# Patient Record
Sex: Male | Born: 1965 | Race: Black or African American | Hispanic: No | Marital: Single | Smoking: Former smoker
Health system: Southern US, Community
[De-identification: ages and names within clinical notes are randomized; demographics above are authoritative.]

## PROBLEM LIST (undated history)

## (undated) DIAGNOSIS — Z87442 Personal history of urinary calculi: Secondary | ICD-10-CM

## (undated) DIAGNOSIS — Z800 Family history of malignant neoplasm of digestive organs: Secondary | ICD-10-CM

## (undated) DIAGNOSIS — N529 Male erectile dysfunction, unspecified: Secondary | ICD-10-CM

## (undated) DIAGNOSIS — Z9289 Personal history of other medical treatment: Secondary | ICD-10-CM

## (undated) DIAGNOSIS — M545 Low back pain: Secondary | ICD-10-CM

## (undated) DIAGNOSIS — Z8249 Family history of ischemic heart disease and other diseases of the circulatory system: Secondary | ICD-10-CM

## (undated) DIAGNOSIS — Z973 Presence of spectacles and contact lenses: Secondary | ICD-10-CM

## (undated) DIAGNOSIS — M1990 Unspecified osteoarthritis, unspecified site: Secondary | ICD-10-CM

## (undated) DIAGNOSIS — N200 Calculus of kidney: Secondary | ICD-10-CM

## (undated) DIAGNOSIS — Z87891 Personal history of nicotine dependence: Secondary | ICD-10-CM

## (undated) HISTORY — DX: Low back pain: M54.5

## (undated) HISTORY — DX: Calculus of kidney: N20.0

## (undated) HISTORY — PX: WISDOM TOOTH EXTRACTION: SHX21

## (undated) HISTORY — DX: Presence of spectacles and contact lenses: Z97.3

## (undated) HISTORY — DX: Family history of malignant neoplasm of digestive organs: Z80.0

## (undated) HISTORY — DX: Personal history of nicotine dependence: Z87.891

## (undated) HISTORY — DX: Family history of ischemic heart disease and other diseases of the circulatory system: Z82.49

## (undated) HISTORY — DX: Personal history of other medical treatment: Z92.89

## (undated) HISTORY — PX: FOOT SURGERY: SHX648

## (undated) HISTORY — DX: Male erectile dysfunction, unspecified: N52.9

---

## 2007-05-09 ENCOUNTER — Emergency Department (HOSPITAL_COMMUNITY): Admission: EM | Admit: 2007-05-09 | Discharge: 2007-05-09 | Payer: Self-pay | Admitting: Emergency Medicine

## 2008-06-28 ENCOUNTER — Emergency Department (HOSPITAL_COMMUNITY): Admission: EM | Admit: 2008-06-28 | Discharge: 2008-06-28 | Payer: Self-pay | Admitting: Emergency Medicine

## 2009-05-05 DIAGNOSIS — Z8249 Family history of ischemic heart disease and other diseases of the circulatory system: Secondary | ICD-10-CM

## 2009-05-05 HISTORY — DX: Family history of ischemic heart disease and other diseases of the circulatory system: Z82.49

## 2009-09-26 ENCOUNTER — Ambulatory Visit: Payer: Self-pay | Admitting: Internal Medicine

## 2009-09-26 ENCOUNTER — Encounter (INDEPENDENT_AMBULATORY_CARE_PROVIDER_SITE_OTHER): Payer: Self-pay | Admitting: *Deleted

## 2009-09-26 DIAGNOSIS — Z87891 Personal history of nicotine dependence: Secondary | ICD-10-CM

## 2009-09-26 DIAGNOSIS — M545 Low back pain, unspecified: Secondary | ICD-10-CM

## 2009-09-26 HISTORY — DX: Low back pain, unspecified: M54.50

## 2009-09-26 HISTORY — DX: Personal history of nicotine dependence: Z87.891

## 2009-09-28 ENCOUNTER — Encounter (INDEPENDENT_AMBULATORY_CARE_PROVIDER_SITE_OTHER): Payer: Self-pay | Admitting: *Deleted

## 2009-09-28 ENCOUNTER — Ambulatory Visit: Payer: Self-pay | Admitting: Internal Medicine

## 2009-09-28 ENCOUNTER — Ambulatory Visit: Payer: Self-pay | Admitting: Cardiovascular Disease

## 2009-09-28 DIAGNOSIS — N200 Calculus of kidney: Secondary | ICD-10-CM

## 2009-09-28 HISTORY — DX: Calculus of kidney: N20.0

## 2009-10-03 ENCOUNTER — Encounter (INDEPENDENT_AMBULATORY_CARE_PROVIDER_SITE_OTHER): Payer: Self-pay | Admitting: *Deleted

## 2009-10-03 ENCOUNTER — Telehealth: Payer: Self-pay | Admitting: Internal Medicine

## 2009-10-05 ENCOUNTER — Ambulatory Visit: Payer: Self-pay | Admitting: Internal Medicine

## 2009-10-05 LAB — CONVERTED CEMR LAB
Ketones, ur: NEGATIVE mg/dL
Leukocytes, UA: NEGATIVE
Nitrite: NEGATIVE
Specific Gravity, Urine: 1.02 (ref 1.000–1.030)
Urobilinogen, UA: 0.2 (ref 0.0–1.0)
pH: 5.5 (ref 5.0–8.0)

## 2009-10-31 ENCOUNTER — Encounter: Payer: Self-pay | Admitting: Internal Medicine

## 2009-11-01 ENCOUNTER — Ambulatory Visit: Payer: Self-pay | Admitting: Internal Medicine

## 2009-11-12 ENCOUNTER — Telehealth: Payer: Self-pay | Admitting: Internal Medicine

## 2009-11-13 ENCOUNTER — Encounter: Payer: Self-pay | Admitting: Internal Medicine

## 2010-01-01 ENCOUNTER — Ambulatory Visit: Payer: Self-pay | Admitting: Family Medicine
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
## 2010-06-04 NOTE — Progress Notes (Signed)
Summary: MRI l-spine denial  ---- Converted from flag ---- ---- 11/12/2009 8:53 AM, Shelbie Proctor wrote: Yes-Appt scheduled for 11-13-2009@10 :45 am  [place]   ---- 11/12/2009 8:30 AM, Newt Lukes MD wrote: did he get referral to Nsurg yet? - have pt keep consult visit with Nsurg and they can arrange for MRI as needed - thanks  ---- 11/12/2009 8:10 AM, Shelbie Proctor wrote: Dr Felicity Coyer pt was not approved by insurance company [employer]  to have MRI Lumbar spine, see fax on your desk appt was not made please advise ------------------------------  noted - ok - thanks

## 2010-06-04 NOTE — Letter (Signed)
Summary: Out of Work  [facility]  [address] [place], [place] [address]   Phone: [phone]  Fax: [phone]    October 03, 2009   Employee:  Mitchell Hughes    To Whom It May Concern:   For Medical reasons, please excuse the above named employee from work for the following dates:  Start:   10/04/2009  End:   10/08/2009  Return to work 10/08/2009  If you need additional information, please feel free to contact our office.         Sincerely,    Dr. Oliver Barre

## 2010-06-04 NOTE — Progress Notes (Signed)
Summary: Work note/VAL pt  Phone Note Call from Patient Call back at [employer] [phone]   Caller: Patient Summary of Call: pt called stating that he was written out of work until today but is still experiencing pain. Pt contacted his employer and was informed that there is no light duty. Pt is requesting work note extention until Friday June 3rd to return to work Monday June 6th. please advise. Initial call taken by: Margaret Pyle, CMA,  October 03, 2009 10:03 AM  Follow-up for Phone Call        ok for note extension - to robin to handle Follow-up by: Corwin Levins MD,  October 03, 2009 1:31 PM  Additional Follow-up for Phone Call Additional follow up Details #1::        patient informed work note extension complete and to pickup at front desk. Additional Follow-up by: Scharlene Gloss,  October 03, 2009 2:28 PM

## 2010-06-04 NOTE — Consult Note (Signed)
Summary: [facility]  [facility]   Imported By: Lennie Odor 11/07/2009 16:54:35  _____________________________________________________________________  External Attachment:    Type:   Image     Comment:   External Document

## 2010-06-04 NOTE — Letter (Signed)
Summary: Out of Work  [facility]  [address] [place], [place] [address]   Phone: [phone]  Fax: [phone]    Sep 28, 2009   Employee:  Mitchell Hughes    To Whom It May Concern:   For Medical reasons, please excuse the above named employee from work for the following dates:  Start: 09/26/09    End: 10/03/09    If you need additional information, please feel free to contact our office.         Sincerely,    Dr. Rene Paci

## 2010-06-04 NOTE — Consult Note (Signed)
Summary: [facility]  [facility]   Imported By: Lester [place] 11/21/2009 09:34:03  _____________________________________________________________________  External Attachment:    Type:   Image     Comment:   External Document

## 2010-06-04 NOTE — Assessment & Plan Note (Signed)
Summary: BACK PAIN  STC   Vital Signs:  Patient profile:   45 year old male Height:      77 inches (195.58 cm) Weight:      219.8 pounds (99.91 kg) O2 Sat:      98 % on Room air Temp:     98.4 degrees F (36.89 degrees C) oral Pulse rate:   66 / minute BP sitting:   102 / 68  (left arm) Cuff size:   large  Vitals Entered By: Orlan Leavens (November 01, 2009 8:58 AM)  O2 Flow:  Room air CC: Back pain Is Patient Diabetic? No Pain Assessment Patient in pain? yes     Location: lower back Type: aching   Primary Care Provider:  Newt Lukes MD  CC:  Back pain.  History of Present Illness:  Back Pain      This is a 45 year old man who presents with continued Back pain.  The symptoms began 4 weeks ago.  The intensity is described as moderate.  Previously, pain 10/10 at start, now 4-5/10 with treatment.  The patient denies fever, chills, weakness, loss of sensation, fecal incontinence, urinary incontinence, urinary retention, dysuria, rest pain, and inability to care for self.  The pain is located in the right low back.  The pain began gradually.  The pain radiates to the right flank and right hip.  The pain is made worse by flexion and extension.  The pain is made better by inactivity, NSAID medications including pred pak, muscle relaxants, and opioids.  He has gone back to work but still struggling with same pain.  dx kidney stone 09/26/09 - ?possible cause of same pain -- recent uro eval for same - told stone not cause of these symptoms   Current Medications (verified): 1)  Excedrin Back & Body 250-250 Mg Tabs (Acetaminophen-Aspirin Buffered) .... Take As Needed 2)  Robaxin-750 750 Mg Tabs (Methocarbamol) .Marland Kitchen.. 1 By Mouth Three Times A Day As Needed For Spasm Pain 3)  Vicodin Es 7.5-750 Mg Tabs (Hydrocodone-Acetaminophen) .Marland Kitchen.. 1 By Mouth Every 6 Hours Orn For Severe Pain 4)  Indomethacin 25 Mg Caps (Indomethacin) .Marland Kitchen.. 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    Physical Exam  General:  uncomfortable but less so than prior OVs  -alert, well-developed, well-nourished, and well-hydrated.   Lungs:  normal respiratory effort, no intercostal retractions, no accessory muscle use, normal breath sounds, no dullness, no fremitus, no crackles, and no wheezes.   Heart:  normal rate, regular rhythm, no murmur, no gallop, no rub, and no JVD.   Msk:  back: full range of motion of lumbar spine. tender to palpation over right paraspinal resion R>L upper lumbar region.. positive ipsilateral straight leg raise. Deep tendon reflexes symmetrically intact at Achilles and patella, negative clonus. Sensation intact throughout all dermatomes in bilateral lower extremities. Full strength to manual muscle testing in all major muscule groups including EHL, anterior tibialis, gastrocnemius, quadriceps, and iliopsoas. Able to heel and toe walk without difficulty and ambulates with a normal gait.   Neurologic:  see Mskel above   Impression & Recommendations:  Problem # 1:  LOW BACK PAIN, ACUTE (ICD-724.2)  ongoing symptoms > 4 weeks, improved but still 5/10 pain despite aggressive med care (pred pak, muscle relax, NSAIDs and narcotics) arrange for MRI now and refer to Nsurg for suspected ruptured disc given abn on plain xray (09/26/09 reviewed - spondylitic changes, retrolisthiesis L 2 on 3, loss of disc height L5-S1), pt high risk of disc rupture, esp high lumbar given location and intesity of continued pain --- refeill vicodin today  His updated medication list for  this problem includes:    Excedrin Back & Body 250-250 Mg Tabs (Acetaminophen-aspirin buffered) .Marland Kitchen... Take as needed    Robaxin-750 750 Mg Tabs (Methocarbamol) .Marland Kitchen... 1 by mouth three times a day as needed for spasm pain    Vicodin Es 7.5-750 Mg Tabs (Hydrocodone-acetaminophen) .Marland Kitchen... 1 by mouth every 6 hours orn for severe pain    Indomethacin 25 Mg Caps (Indomethacin) .Marland Kitchen... 1 by mouth three times a day as needed for pain  Orders: Radiology Referral (Radiology) Neurosurgeon Referral (Neurosurgeon)  Problem # 2:  RENAL CALCULUS, RIGHT (ICD-592.0) not felt to be cause of symptoms - s/p uro eval for same -  Complete Medication List: 1)  Excedrin Back & Body 250-250 Mg Tabs (Acetaminophen-aspirin buffered) .... Take as needed 2)  Robaxin-750 750 Mg Tabs (Methocarbamol) .Marland Kitchen.. 1 by mouth three times a day as needed for spasm pain 3)  Vicodin Es 7.5-750 Mg Tabs (Hydrocodone-acetaminophen) .Marland Kitchen.. 1 by mouth every 6 hours orn for severe pain 4)  Indomethacin 25 Mg Caps (Indomethacin) .Marland Kitchen.. 1 by mouth three times a day as needed for pain  Patient Instructions: 1)  it was good to see you today. 2)  we'll make referral for MRI of your back and evaluation by neurosurg if there is a ruptured disc or pinched nerve. Our office will contact you regarding these appointments once made.  3)  continue to drink at least two extra quarts of water every day to prevent new kidney stone formation. 4)  Most patients (90%) with low back pain will improve with time (2-6 weeks). Keep active but avoid activities that are painful. Apply moist heat and/or ice to lower back several times a day. Prescriptions: VICODIN ES 7.5-750 MG TABS (HYDROCODONE-ACETAMINOPHEN) 1 by mouth every 6 hours orn for severe pain  #20 x 1   Entered and Authorized by:   Newt Lukes MD   Signed by:   Newt Lukes MD on 11/01/2009   Method used:   Print then Give to Patient   RxID:   [id number]

## 2010-06-04 NOTE — Letter (Signed)
Summary: Out of Work  [facility]  [address] [place], [place] [address]   Phone: [phone]  Fax: [phone]    Sep 26, 2009   Employee:  DAUNTAE DERUSHA    To Whom It May Concern:   For Medical reasons, please excuse the above named employee from work for the following dates:  Start: 09/26/09    End: 09/28/09    If you need additional information, please feel free to contact our office.         Sincerely,    Dr. Rene Paci

## 2010-06-04 NOTE — Assessment & Plan Note (Signed)
Summary: BACK PAIN/ KIDNEY STONE? / [facility]   Vital Signs:  Patient profile:   45 year old male Height:      77 inches Weight:      218.75 pounds BMI:     26.03 O2 Sat:      97 % on Room air Temp:     98.0 degrees F oral Pulse rate:   88 / minute Pulse rhythm:   regular Resp:     16 per minute BP sitting:   108 / 80  (left arm) Cuff size:   large  Vitals Entered By: Rock Nephew CMA (October 05, 2009 10:58 AM)  Nutrition Counseling: Patient's BMI is greater than 25 and therefore counseled on weight management options.  O2 Flow:  Room air  Primary Care Provider:  Newt Lukes MD  CC:  Back pain.  History of Present Illness:  Back Pain      This is a 45 year old man who presents with Back pain.  The symptoms began 2 weeks ago.  The intensity is described as moderate.  The patient reports inability to work, but denies fever, chills, weakness, loss of sensation, fecal incontinence, urinary incontinence, urinary retention, dysuria, rest pain, and inability to care for self.  The pain is located in the right low back.  The pain began gradually.  The pain radiates to the right flank and right hip.  The pain is made worse by flexion and extension.  The pain is made better by inactivity, NSAID medications, muscle relaxants, and opioids.  He wants to try to go back to work next Monday.  Preventive Screening-Counseling & Management  Alcohol-Tobacco     Alcohol drinks/day: 0     Smoking Status: quit  Caffeine-Diet-Exercise     Does Patient Exercise: yes      Drug Use:  no.    Medications Prior to Update: 1)  Excedrin Back & Body 250-250 Mg Tabs (Acetaminophen-Aspirin Buffered) .... Take As Needed 2)  Prednisone (Pak) 10 Mg Tabs (Prednisone) .... As Directed X 12 Days 3)  Robaxin-750 750 Mg Tabs (Methocarbamol) .Marland Kitchen.. 1 By Mouth Three Times A Day As Needed For Spasm Pain 4)  Vicodin Es 7.5-750 Mg Tabs (Hydrocodone-Acetaminophen) .Marland Kitchen.. 1 By Mouth Every 6 Hours Orn For Severe Pain 5)   Indomethacin 25 Mg Caps (Indomethacin) .Marland Kitchen.. 1 By Mouth Three Times A Day As Needed For Pain  Current Medications (verified): 1)  Excedrin Back & Body 250-250 Mg Tabs (Acetaminophen-Aspirin Buffered) .... Take As Needed 2)  Prednisone (Pak) 10 Mg Tabs (Prednisone) .... As Directed X 12 Days 3)  Robaxin-750 750 Mg Tabs (Methocarbamol) .Marland Kitchen.. 1 By Mouth Three Times A Day As Needed For Spasm Pain 4)  Vicodin Es 7.5-750 Mg Tabs (Hydrocodone-Acetaminophen) .Marland Kitchen.. 1 By Mouth Every 6 Hours Orn For Severe Pain 5)  Indomethacin 25 Mg Caps (Indomethacin) .Marland Kitchen.. 1 By Mouth Three Times A Day As Needed For Pain  Allergies (verified): No Known Drug Allergies  Past History:  Past Medical History: Reviewed history from 09/28/2009 and no changes required. R kidney stone, 9.38mm (09/28/09 CT)  Past Surgical History: Reviewed history from 09/26/2009 and no changes required. Denies surgical history  Family History: Reviewed history from 09/26/2009 and no changes required. Family History of Colon CA 1st degree relative <60 (other relative) Heart disease (parent, other relative)  Social History: Reviewed history from 09/26/2009 and no changes required. Former Smoker - quit 03/2009 social alcohol -  works 3rd shift -  Alcohol  use-no Drug use-no Regular exercise-yes Drug Use:  no Does Patient Exercise:  yes  Review of Systems  The patient denies anorexia, fever, weight loss, chest pain, syncope, prolonged cough, headaches, hemoptysis, abdominal pain, hematuria, incontinence, and enlarged lymph nodes.   GU:  Denies discharge, dysuria, hematuria, incontinence, urinary frequency, and urinary hesitancy.  Physical Exam  General:  alert, well-developed, well-nourished, well-hydrated, appropriate dress, normal appearance, healthy-appearing, cooperative to examination, and good hygiene.   Head:  normocephalic, atraumatic, no abnormalities observed, and no abnormalities palpated.   Eyes:  vision grossly intact  and no injection.   Mouth:  Oral mucosa and oropharynx without lesions or exudates.  Teeth in good repair. Neck:  supple, full ROM, and no masses.   Lungs:  normal respiratory effort, no intercostal retractions, no accessory muscle use, normal breath sounds, no dullness, no fremitus, no crackles, and no wheezes.   Heart:  normal rate, regular rhythm, no murmur, no gallop, no rub, and no JVD.   Abdomen:  soft, non-tender, normal bowel sounds, no distention, no masses, no guarding, no rigidity, no rebound tenderness, no hepatomegaly, and no splenomegaly.  No CVAT. Msk:  normal ROM, no joint tenderness, no joint swelling, no joint warmth, no redness over joints, no joint deformities, no joint instability, and no crepitation.   Pulses:  R and L carotid,radial,femoral,dorsalis pedis and posterior tibial pulses are full and equal bilaterally Extremities:  No clubbing, cyanosis, edema, or deformity noted with normal full range of motion of all joints.   Neurologic:  No cranial nerve deficits noted. Station and gait are normal. Plantar reflexes are down-going bilaterally. DTRs are symmetrical throughout. Sensory, motor and coordinative functions appear intact. Skin:  turgor normal, color normal, no rashes, no suspicious lesions, no ecchymoses, no petechiae, no purpura, no ulcerations, and no edema.   Cervical Nodes:  no anterior cervical adenopathy and no posterior cervical adenopathy.   Psych:  Cognition and judgment appear intact. Alert and cooperative with normal attention span and concentration. No apparent delusions, illusions, hallucinations   Detailed Back/Spine Exam  Gait:    antalgic.    Lumbosacral Exam:  Inspection-deformity:    Normal Palpation-spinal tenderness:  Normal Range of Motion:    Forward Flexion:   85 degrees    Hyperextension:   30 degrees    Right Lateral Bend:   30 degrees    Left Lateral Bend:   30 degrees Lying Straight Leg Raise:    Right:  negative    Left:   negative Sitting Straight Leg Raise:    Right:  negative    Left:  negative Contralateral Straight Leg Raise:    Right:  negative    Left:  negative Sciatic Notch:    There is no sciatic notch tenderness. Toe Walking:    Right:  normal    Left:  normal Heel Walking:    Right:  normal    Left:  normal   Impression & Recommendations:  Problem # 1:  RENAL CALCULUS, RIGHT (ICD-592.0) Assessment Unchanged if there is blood in his urine then his pain may be renal colic, otherwise the data shows that this is a non-obstructing and uncomplicated stone that is not causing any symptoms.  Orders: TLB-Udip w/ Micro (81001-URINE)  Problem # 2:  LOW BACK PAIN, ACUTE (ICD-724.2) Assessment: Improved Fitness for Duty Certification was completed by me today- see scanned document, will offer PT is he is not able to work on Monday due to pain His updated medication list for this  problem includes:    Excedrin Back & Body 250-250 Mg Tabs (Acetaminophen-aspirin buffered) .Marland Kitchen... Take as needed    Robaxin-750 750 Mg Tabs (Methocarbamol) .Marland Kitchen... 1 by mouth three times a day as needed for spasm pain    Vicodin Es 7.5-750 Mg Tabs (Hydrocodone-acetaminophen) .Marland Kitchen... 1 by mouth every 6 hours orn for severe pain    Indomethacin 25 Mg Caps (Indomethacin) .Marland Kitchen... 1 by mouth three times a day as needed for pain  Discussed use of moist heat or ice, modified activities, medications, and stretching/strengthening exercises. Back care instructions given. To be seen in 2 weeks if no improvement; sooner if worsening of symptoms.   Orders: Physical Therapy Referral (PT)  Complete Medication List: 1)  Excedrin Back & Body 250-250 Mg Tabs (Acetaminophen-aspirin buffered) .... Take as needed 2)  Prednisone (pak) 10 Mg Tabs (Prednisone) .... As directed x 12 days 3)  Robaxin-750 750 Mg Tabs (Methocarbamol) .Marland Kitchen.. 1 by mouth three times a day as needed for spasm pain 4)  Vicodin Es 7.5-750 Mg Tabs (Hydrocodone-acetaminophen)  .Marland Kitchen.. 1 by mouth every 6 hours orn for severe pain 5)  Indomethacin 25 Mg Caps (Indomethacin) .Marland Kitchen.. 1 by mouth three times a day as needed for pain  Patient Instructions: 1)  Please schedule a follow-up appointment in 1 month. 2)  Drink at least two extra quarts of water every day to prevent kidney stone formation. 3)  Most patients (90%) with low back pain will improve with time (2-6 weeks). Keep active but avoid activities that are painful. Apply moist heat and/or ice to lower back several times a day.
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
## 2010-10-31 ENCOUNTER — Encounter: Payer: Self-pay | Admitting: Internal Medicine

## 2011-03-04 ENCOUNTER — Encounter: Payer: Self-pay | Admitting: Family Medicine

## 2013-03-14 ENCOUNTER — Ambulatory Visit
Admission: RE | Admit: 2013-03-14 | Discharge: 2013-03-14 | Disposition: A | Discharge status: Home / Self Care | Payer: Managed Care, Other (non HMO) | Source: Ambulatory Visit | Attending: Medical | Admitting: Medical

## 2013-03-14 ENCOUNTER — Ambulatory Visit (INDEPENDENT_AMBULATORY_CARE_PROVIDER_SITE_OTHER): Payer: Managed Care, Other (non HMO) | Admitting: Medical

## 2013-03-14 ENCOUNTER — Encounter: Payer: Self-pay | Admitting: Medical

## 2013-03-14 VITALS — BP 120/80 | HR 60 | Temp 98.2°F | Resp 16 | Wt 235.0 lb

## 2013-03-14 DIAGNOSIS — B356 Tinea cruris: Secondary | ICD-10-CM

## 2013-03-14 DIAGNOSIS — M25511 Pain in right shoulder: Secondary | ICD-10-CM

## 2013-03-14 DIAGNOSIS — M25519 Pain in unspecified shoulder: Secondary | ICD-10-CM

## 2013-03-14 MED ORDER — FLUCONAZOLE 150 MG PO TABS: ORAL_TABLET | ORAL | Status: DC

## 2013-03-14 MED ORDER — DICLOFENAC SODIUM 75 MG PO TBEC: 75.0000 mg | DELAYED_RELEASE_TABLET | Freq: Two times a day (BID) | ORAL | Status: DC

## 2013-03-14 MED ORDER — NYSTATIN 100000 UNIT/GM EX POWD: Freq: Four times a day (QID) | CUTANEOUS | Status: DC

## 2013-03-14 NOTE — Patient Instructions (Addendum)
Jock itch - continued Lamisil cream daily, begin Diflucan tablet, 1 tablet now, repeat in 1 week.  You can also use the Nystatin powder for the next 1-2 weeks, daily, then just as needed.  Consider boxers.   Keep genital region dry.   Shoulder pain - go for xray.  Begin Diclofenac tablet twice daily for pain.  Use ice pack 20 minutes each evening.  If the pain and range of motion imporved in the next 7-10 days, then try some home rehab measures.

## 2013-03-14 NOTE — Progress Notes (Signed)
Subjective:  Mitchell Hughes is a 47 y.o. male who presents as a new returning patient.  He has c/o right shoulder pain.  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   He is right handed.  Works in [employer], receiving, does some lifting on the job.  Over the last few months he has had more pain with lifting shoulder in any direction.  Keeps him up at night.  Pain doesn't radiate.   It is deep within the shoulder joint.  improved if arm still.  Discomfort is constant.  Has tried Advil, helps with throbbing pain, heat.  Denies neck pain, stiffness, denies arm numbness, tingling, no shooting pain down arms.  No swelling, no weakness.  Mother had hx/o shoulder arthritis. Runs for exercise.  Was lifting weights up until pain worsened.  No prior injury, fall, trauma in the past or recent.  No prior eval for similar.  No other aggravating or relieving factors.    He notes hx/o jock itch for months. Has used Lamisil and other OTC creams.  It won't go away.  Usually wears boxer briefs, gets sweaty.  Has had jock itch before, but usually it will resolve with OTC medications.    No other c/o.  The following portions of the patient's history were reviewed and updated as appropriate: allergies, current medications, past family history, past medical history, past social history, past surgical history and problem list.  ROS Otherwise as in subjective above  Objective: Physical Exam  BP 120/80  Pulse 60  Temp(Src) 98.2 F (36.8 C) (Oral)  Resp 16  Wt 235 lb (106.595 kg)   General appearance: alert, no distress, WD/WN Neck: supple, normal ROM, no lymphadenopathy, no thyromegaly, no masses MSK: right shoulder nontender to palpation, reduced internal and external ROM, pain with flexion and abduction over 80 degrees, pain with both passive active and resisted ROM, +empty can test, + hawkins test, + drop arm.  Rest of bilat UE unremarkable Back: upper back nontender, no  deformity Pulses: 2+ radial pulses, 2+ pedal pulses, normal cap refill Ext: no edema Neuro: normal UE strength, sensation DTRS Skin: scrotum and adjacent inner thighs as well as penis with rough, raw, pink/red appearance suggestive of tinea   Assessment: Encounter Diagnoses  Name Primary?  . Shoulder pain, right Yes  . Tinea cruris      Plan: Patient Instructions  Jock itch - continued Lamisil cream daily, begin Diflucan tablet, 1 tablet now, repeat in 1 week.  You can also use the Nystatin powder for the next 1-2 weeks, daily, then just as needed.  Consider boxers.   Keep genital region dry.   Shoulder pain - go for xray.  Begin Diclofenac tablet twice daily for pain.  Use ice pack 20 minutes each evening.  If the pain and range of motion imporved in the next 7-10 days, then try some home rehab measures.

## 2013-03-18 NOTE — Progress Notes (Signed)
lmom to cb. cls 

## 2013-03-28 ENCOUNTER — Encounter: Payer: Self-pay | Admitting: Family Medicine

## 2013-03-28 ENCOUNTER — Ambulatory Visit (INDEPENDENT_AMBULATORY_CARE_PROVIDER_SITE_OTHER): Payer: Managed Care, Other (non HMO) | Admitting: Family Medicine

## 2013-03-28 ENCOUNTER — Ambulatory Visit: Payer: Self-pay | Admitting: Family Medicine

## 2013-03-28 VITALS — BP 112/70 | HR 84 | Wt 230.0 lb

## 2013-03-28 DIAGNOSIS — M67919 Unspecified disorder of synovium and tendon, unspecified shoulder: Secondary | ICD-10-CM

## 2013-03-28 DIAGNOSIS — M7581 Other shoulder lesions, right shoulder: Secondary | ICD-10-CM

## 2013-03-28 MED ORDER — TRIAMCINOLONE ACETONIDE 40 MG/ML IJ SUSP
40.0000 mg | Freq: Once | INTRAMUSCULAR | Status: AC
  Administered 2013-03-28: 40 mg via INTRAMUSCULAR

## 2013-03-28 MED ORDER — LIDOCAINE HCL (PF) 1 % IJ SOLN
3.0000 mL | Freq: Once | INTRAMUSCULAR | Status: AC
  Administered 2013-03-28: 3 mL via INTRADERMAL

## 2013-03-28 NOTE — Progress Notes (Signed)
  Subjective:    Patient ID: Mitchell Hughes, male    DOB: 02/25/1966, 47 y.o.   MRN: [id number]  HPI He is here for evaluation of right shoulder pain and possible injection. See previous note for history.   Review of Systems     Objective:   Physical Exam Pain on motion of the shoulder with abduction, internal and external rotation. No palpable tenderness. Negative sulcus sign. Drop arm test was uncomfortable. Near his and Mitchell Hughes test cause discomfort.       Assessment & Plan:  Rotator cuff tendinitis, right - Plan: triamcinolone acetonide (KENALOG-40) injection 40 mg, lidocaine (PF) (XYLOCAINE) 1 % injection 3 mL  discussed injection and its benefits. The right shoulder was prepped with Betadine and 40 mg of Kenalog and 3 cc of Xylocaine was injected into the subacromial bursa without difficulty. Within the first few minutes he did obtain some relief of his symptoms. Recommend he call me if he has any difficulties. Explained that this his symptoms came back quickly, further evaluation will be needed.

## 2013-05-05 DIAGNOSIS — Z9289 Personal history of other medical treatment: Secondary | ICD-10-CM

## 2013-05-05 HISTORY — DX: Personal history of other medical treatment: Z92.89

## 2013-06-14 ENCOUNTER — Ambulatory Visit (INDEPENDENT_AMBULATORY_CARE_PROVIDER_SITE_OTHER): Payer: BC Managed Care – PPO | Admitting: Medical

## 2013-06-14 ENCOUNTER — Encounter: Payer: Self-pay | Admitting: Medical

## 2013-06-14 VITALS — BP 118/78 | HR 80 | Temp 98.1°F | Resp 16 | Wt 233.0 lb

## 2013-06-14 DIAGNOSIS — M25519 Pain in unspecified shoulder: Secondary | ICD-10-CM

## 2013-06-14 DIAGNOSIS — M25511 Pain in right shoulder: Secondary | ICD-10-CM

## 2013-06-14 MED ORDER — LIDOCAINE HCL 1 % IJ SOLN
10.0000 mL | Freq: Once | INTRAMUSCULAR | Status: AC
  Administered 2013-06-14: 10 mL

## 2013-06-14 MED ORDER — TRIAMCINOLONE ACETONIDE 40 MG/ML IJ SUSP
40.0000 mg | Freq: Once | INTRAMUSCULAR | Status: AC
  Administered 2013-06-14: 40 mg via INTRAMUSCULAR

## 2013-06-14 MED ORDER — HYDROCODONE-ACETAMINOPHEN 7.5-325 MG PO TABS: 1.0000 | ORAL_TABLET | Freq: Four times a day (QID) | ORAL | Status: DC | PRN

## 2013-06-14 NOTE — Progress Notes (Signed)
Subjective:  Mitchell Hughes is a 48 y.o. male who presents for recheck on right shoulder pain.  I saw him 03/14/2013 for this issue.  We sent him for x-ray which was normal, and he subsequently came back and saw Dr. Redmond School here on 03/28/13 for steroidal injection of the same shoulder.  For about 2 months the pain has been gone.  However, the last few weeks the pain returned, worse this time.  Feels a pounding in the shoulder.    Pain currently with reaching for something or raising the arm up.   Using Advil a few tablets morning and evening.  Using some ice occasionally.  No use of arm sling.    He is right handed.  He works in [employer], receiving, does some lifting on the job.  Over the last several months he has had more pain with lifting shoulder in any direction.  Keeps him up at night.  Pain doesn't radiate.   It is deep within the shoulder joint.  improved if arm still.  Discomfort is constant.  Has tried Advil, helps with throbbing pain, heat.  Denies neck pain, stiffness, denies arm numbness, tingling, no shooting pain down arms.  No swelling, no weakness.  Was lifting weights up until pain worsened.  No prior injury, fall, trauma in the past or recent.  No other aggravating or relieving factors.  No other c/o.  The following portions of the patient's history were reviewed and updated as appropriate: allergies, current medications, past family history, past medical history, past social history, past surgical history and problem list.  ROS Otherwise as in subjective above  Objective: Physical Exam  BP 118/78  Pulse 80  Temp(Src) 98.1 F (36.7 C) (Oral)  Resp 16  Wt 233 lb (105.688 kg)   General appearance: alert, no distress, WD/WN Neck: supple, normal ROM, no lymphadenopathy, no thyromegaly, no masses MSK: right shoulder nontender to palpation, reduced internal and external ROM, pain with flexion and abduction over 80 degrees, pain with both passive active and resisted ROM, +empty  can test, + hawkins test, + drop arm.  Rest of bilat UE unremarkable Back: upper back nontender, no deformity Pulses: 2+ radial pulses, 2+ pedal pulses, normal cap refill Ext: no edema Neuro: normal UE strength, sensation DTRs  Assessment: Encounter Diagnosis  Name Primary?  . Right shoulder pain Yes     Plan: Reviewed the 03/2013 xray of the shoulder which was normal, discussed case again with Dr. Redmond School.   Gave patient options including repeat steroidal joint injection , vs oral medication and conservative therapy vs referral to ortho.  At this point he would like to proceed for another injection.   Discussed risks/benefits of procedure, cleaned and prepped the right shoulder in usual sterile fashion. Using a 10 cc syringe, 20-gauge needle, injected 40 mg of triamcinolone and 3 cc of 1% lidocaine without epinephrine posterior approach the right shoulder.    Prescribed hydrocodone he can use as needed for pain, advise a few days of rest, arm sling on and off, ice, and gentle range of motion activity.  We'll plan to have him go for physical therapy in 2 weeks.  At that point he can use diclofenac as needed.  However if really no improvement over the next 2 weeks, we would then just plan to refer to orthopedics at his request.  Return pending call back for PT.

## 2013-06-17 ENCOUNTER — Other Ambulatory Visit: Payer: Self-pay | Admitting: Family Medicine

## 2013-06-17 ENCOUNTER — Telehealth: Payer: Self-pay | Admitting: Family Medicine

## 2013-06-17 DIAGNOSIS — M25519 Pain in unspecified shoulder: Secondary | ICD-10-CM

## 2013-06-17 NOTE — Telephone Encounter (Signed)
Referral order are in the system. CLS They will contact the patient to set up the appointment. CLS

## 2013-06-17 NOTE — Telephone Encounter (Signed)
Message copied by Armanda Magic on Fri Jun 17, 2013  3:48 PM ------      Message from: Carlena Hurl      Created: Tue Jun 14, 2013  9:26 PM       Refer to physical therapy.  He lives and works over close to [employer].   If there is a PT office in that area, lets go with them.            Schedule this for 2 weeks from now! ------

## 2013-06-28 ENCOUNTER — Telehealth: Payer: Self-pay | Admitting: Medical

## 2013-06-28 NOTE — Telephone Encounter (Signed)
Therapy sent message that they can't reach him by phone.  He apparently has a $2500 deductible too.   Thus, call and see how the shoulder is doing.

## 2013-06-29 NOTE — Telephone Encounter (Signed)
I called and left the patient to CB. CLS

## 2013-12-20 ENCOUNTER — Ambulatory Visit (INDEPENDENT_AMBULATORY_CARE_PROVIDER_SITE_OTHER): Payer: BC Managed Care – PPO | Admitting: Podiatry

## 2013-12-20 ENCOUNTER — Ambulatory Visit (INDEPENDENT_AMBULATORY_CARE_PROVIDER_SITE_OTHER): Payer: BC Managed Care – PPO

## 2013-12-20 ENCOUNTER — Encounter: Payer: Self-pay | Admitting: Podiatry

## 2013-12-20 VITALS — BP 133/88 | HR 84 | Resp 16 | Ht 77.0 in | Wt 225.0 lb

## 2013-12-20 DIAGNOSIS — M779 Enthesopathy, unspecified: Secondary | ICD-10-CM

## 2013-12-20 DIAGNOSIS — M76829 Posterior tibial tendinitis, unspecified leg: Secondary | ICD-10-CM

## 2013-12-20 DIAGNOSIS — M76811 Anterior tibial syndrome, right leg: Secondary | ICD-10-CM

## 2013-12-20 MED ORDER — MELOXICAM 15 MG PO TABS: 15.0000 mg | ORAL_TABLET | Freq: Every day | ORAL | Status: DC

## 2013-12-20 MED ORDER — METHYLPREDNISOLONE (PAK) 4 MG PO TABS: ORAL_TABLET | ORAL | Status: DC

## 2013-12-20 NOTE — Progress Notes (Signed)
Right foot has been hurting for a while now, sharp pains are running up the foot across the top. It is a throbbing pain right now. He denies any trauma to the foot.  Objective: Vital signs are stable he is alert and oriented x3. Pulses are palpable right foot. He has pain on palpation of the tibialis anterior at its insertion site on the medial cuneiform. Radiographic evaluation confirms some early os or 3 changes of the midfoot. No osseous abnormalities in this area.  Assessment: Insertional tibialis anterior tendinitis right.  Plan: Discussed etiology pathology conservative versus surgical therapies. I injected 2 mg of dexamethasone to the point of maximal tenderness today. I put him in a [facility] started him a Medrol Dosepak to be followed by meloxicam. I will followup with him in 2 weeks if he still symptomatic an MRI will be necessary.

## 2013-12-21 ENCOUNTER — Ambulatory Visit (INDEPENDENT_AMBULATORY_CARE_PROVIDER_SITE_OTHER): Payer: BC Managed Care – PPO | Admitting: Medical

## 2013-12-21 ENCOUNTER — Encounter: Payer: Self-pay | Admitting: Medical

## 2013-12-21 ENCOUNTER — Telehealth: Payer: Self-pay | Admitting: Medical

## 2013-12-21 VITALS — BP 122/82 | HR 72 | Temp 98.1°F | Resp 16 | Ht 75.5 in | Wt 231.0 lb

## 2013-12-21 DIAGNOSIS — Z8249 Family history of ischemic heart disease and other diseases of the circulatory system: Secondary | ICD-10-CM | POA: Insufficient documentation

## 2013-12-21 DIAGNOSIS — Z1211 Encounter for screening for malignant neoplasm of colon: Secondary | ICD-10-CM

## 2013-12-21 DIAGNOSIS — N5201 Erectile dysfunction due to arterial insufficiency: Secondary | ICD-10-CM | POA: Insufficient documentation

## 2013-12-21 DIAGNOSIS — Z23 Encounter for immunization: Secondary | ICD-10-CM

## 2013-12-21 DIAGNOSIS — K4090 Unilateral inguinal hernia, without obstruction or gangrene, not specified as recurrent: Secondary | ICD-10-CM

## 2013-12-21 DIAGNOSIS — N529 Male erectile dysfunction, unspecified: Secondary | ICD-10-CM

## 2013-12-21 DIAGNOSIS — Z0000 Encounter for general adult medical examination without abnormal findings: Secondary | ICD-10-CM

## 2013-12-21 DIAGNOSIS — K469 Unspecified abdominal hernia without obstruction or gangrene: Secondary | ICD-10-CM | POA: Insufficient documentation

## 2013-12-21 DIAGNOSIS — Z800 Family history of malignant neoplasm of digestive organs: Secondary | ICD-10-CM | POA: Insufficient documentation

## 2013-12-21 LAB — COMPREHENSIVE METABOLIC PANEL
ALBUMIN: 4.3 g/dL (ref 3.5–5.2)
ALK PHOS: 49 U/L (ref 39–117)
ALT: 32 U/L (ref 0–53)
AST: 25 U/L (ref 0–37)
BUN: 14 mg/dL (ref 6–23)
CO2: 24 mEq/L (ref 19–32)
Calcium: 9.3 mg/dL (ref 8.4–10.5)
Chloride: 104 mEq/L (ref 96–112)
Creat: 0.96 mg/dL (ref 0.50–1.35)
Glucose, Bld: 101 mg/dL — ABNORMAL HIGH (ref 70–99)
POTASSIUM: 4.5 meq/L (ref 3.5–5.3)
SODIUM: 138 meq/L (ref 135–145)
Total Bilirubin: 0.7 mg/dL (ref 0.2–1.2)
Total Protein: 6.7 g/dL (ref 6.0–8.3)

## 2013-12-21 LAB — CBC
HCT: 43.9 % (ref 39.0–52.0)
HEMOGLOBIN: 14.9 g/dL (ref 13.0–17.0)
MCH: 29.6 pg (ref 26.0–34.0)
MCHC: 33.9 g/dL (ref 30.0–36.0)
MCV: 87.3 fL (ref 78.0–100.0)
Platelets: 232 10*3/uL (ref 150–400)
RBC: 5.03 MIL/uL (ref 4.22–5.81)
RDW: 13.5 % (ref 11.5–15.5)
WBC: 5.8 10*3/uL (ref 4.0–10.5)

## 2013-12-21 LAB — LIPID PANEL
CHOLESTEROL: 173 mg/dL (ref 0–200)
HDL: 47 mg/dL (ref >39)
LDL CALC: 113 mg/dL — AB (ref 0–99)
Total CHOL/HDL Ratio: 3.7 Ratio
Triglycerides: 65 mg/dL (ref <150)
VLDL: 13 mg/dL (ref 0–40)

## 2013-12-21 MED ORDER — VARDENAFIL HCL 10 MG PO TBDP: 1.0000 | ORAL_TABLET | Freq: Every day | ORAL | Status: DC | PRN

## 2013-12-21 MED ORDER — AVANAFIL 100 MG PO TABS: 3.0000 | ORAL_TABLET | Freq: Every day | ORAL | Status: DC | PRN

## 2013-12-21 NOTE — Telephone Encounter (Signed)
Refer to GI for first screening colonoscopy

## 2013-12-21 NOTE — Patient Instructions (Addendum)
Thank you for giving me the opportunity to serve you today.    Your diagnosis today includes: Encounter Diagnoses  Name Primary?  . Routine general medical examination at a health care facility Yes  . Need for prophylactic vaccination and inoculation against influenza   . Need for Tdap vaccination   . Special screening for malignant neoplasms, colon   . Family history of colon cancer   . Family history of heart disease   . Erectile dysfunction due to arterial insufficiency   . Unilateral inguinal hernia without obstruction or gangrene, recurrence not specified      Specific recommendations today include:  See eye doctor yearly  See dentist yearly  We will refer you for a screening colonoscopy given the family history  We updated your flu shot today. Get a flu shot yearly  We updated your Tdap shot today, tetanus, diptheria, pertussis.  This is good for 10 years.  Exercise regularly, eat a healthy low fat diet.  You have a right inguinal hernia on exam.  If this causes pain or gets bigger, then recheck.  Heavy lifting, prolonged toileting, straining can worsen the hernia  Return pending labs.    I have included other useful information below for your review.  Inguinal Hernia, Adult Muscles help keep everything in the body in its proper place. But if a weak spot in the muscles develops, something can poke through. That is called a hernia. When this happens in the lower part of the belly (abdomen), it is called an inguinal hernia. (It takes its name from a part of the body in this region called the inguinal canal.) A weak spot in the wall of muscles lets some fat or part of the small intestine bulge through. An inguinal hernia can develop at any age. Men get them more often than women. CAUSES  In adults, an inguinal hernia develops over time.  It can be triggered by:  Suddenly straining the muscles of the lower abdomen.  Lifting heavy objects.  Straining to have a  bowel movement. Difficult bowel movements (constipation) can lead to this.  Constant coughing. This may be caused by smoking or lung disease.  Being overweight.  Being pregnant.  Working at a job that requires long periods of standing or heavy lifting.  Having had an inguinal hernia before. One type can be an emergency situation. It is called a strangulated inguinal hernia. It develops if part of the small intestine slips through the weak spot and cannot get back into the abdomen. The blood supply can be cut off. If that happens, part of the intestine may die. This situation requires emergency surgery. SYMPTOMS  Often, a small inguinal hernia has no symptoms. It is found when a healthcare provider does a physical exam. Larger hernias usually have symptoms.   In adults, symptoms may include:  A lump in the groin. This is easier to see when the person is standing. It might disappear when lying down.  In men, a lump in the scrotum.  Pain or burning in the groin. This occurs especially when lifting, straining or coughing.  A dull ache or feeling of pressure in the groin.  Signs of a strangulated hernia can include:  A bulge in the groin that becomes very painful and tender to the touch.  A bulge that turns red or purple.  Fever, nausea and vomiting.  Inability to have a bowel movement or to pass gas. DIAGNOSIS  To decide if you have an inguinal hernia,  a healthcare provider will probably do a physical examination.  This will include asking questions about any symptoms you have noticed.  The healthcare provider might feel the groin area and ask you to cough. If an inguinal hernia is felt, the healthcare provider may try to slide it back into the abdomen.  Usually no other tests are needed. TREATMENT  Treatments can vary. The size of the hernia makes a difference. Options include:  Watchful waiting. This is often suggested if the hernia is small and you have had no  symptoms.  No medical procedure will be done unless symptoms develop.  You will need to watch closely for symptoms. If any occur, contact your healthcare provider right away.  Surgery. This is used if the hernia is larger or you have symptoms.  Open surgery. This is usually an outpatient procedure (you will not stay overnight in a hospital). An cut (incision) is made through the skin in the groin. The hernia is put back inside the abdomen. The weak area in the muscles is then repaired by herniorrhaphy or hernioplasty. Herniorrhaphy: in this type of surgery, the weak muscles are sewn back together. Hernioplasty: a patch or mesh is used to close the weak area in the abdominal wall.  Laparoscopy. In this procedure, a surgeon makes small incisions. A thin tube with a tiny video camera (called a laparoscope) is put into the abdomen. The surgeon repairs the hernia with mesh by looking with the video camera and using two long instruments. HOME CARE INSTRUCTIONS   After surgery to repair an inguinal hernia:  You will need to take pain medicine prescribed by your healthcare provider. Follow all directions carefully.  You will need to take care of the wound from the incision.  Your activity will be restricted for awhile. This will probably include no heavy lifting for several weeks. You also should not do anything too active for a few weeks. When you can return to work will depend on the type of job that you have.  During "watchful waiting" periods, you should:  Maintain a healthy weight.  Eat a diet high in fiber (fruits, vegetables and whole grains).  Drink plenty of fluids to avoid constipation. This means drinking enough water and other liquids to keep your urine clear or pale yellow.  Do not lift heavy objects.  Do not stand for long periods of time.  Quit smoking. This should keep you from developing a frequent cough. SEEK MEDICAL CARE IF:   A bulge develops in your groin area.  You  feel pain, a burning sensation or pressure in the groin. This might be worse if you are lifting or straining.  You develop a fever of more than 100.5 F (38.1 C). SEEK IMMEDIATE MEDICAL CARE IF:   Pain in the groin increases suddenly.  A bulge in the groin gets bigger suddenly and does not go down.  For men, there is sudden pain in the scrotum. Or, the size of the scrotum increases.  A bulge in the groin area becomes red or purple and is painful to touch.  You have nausea or vomiting that does not go away.  You feel your heart beating much faster than normal.  You cannot have a bowel movement or pass gas.  You develop a fever of more than 102.0 F (38.9 C). Document Released: 09/07/2008 Document Revised: 07/14/2011 Document Reviewed: 09/07/2008 [facility] Patient Information 2015 [place], [place]. This information is not intended to replace advice given to you by your  health care provider. Make sure you discuss any questions you have with your health care provider.   Erectile Dysfunction Erectile dysfunction is the inability to get or sustain a good enough erection to have sexual intercourse. Erectile dysfunction may involve:  Inability to get an erection.  Lack of enough hardness to allow penetration.  Loss of the erection before sex is finished.  Premature ejaculation. CAUSES  Certain drugs, such as:  Pain relievers.  Antihistamines.  Antidepressants.  Blood pressure medicines.  Water pills (diuretics).  Ulcer medicines.  Muscle relaxants.  Illegal drugs.  Excessive drinking.  Psychological causes, such as:  Anxiety.  Depression.  Sadness.  Exhaustion.  Performance fear.  Stress.  Physical causes, such as:  Artery problems. This may include diabetes, smoking, liver disease, or atherosclerosis.  High blood pressure.  Hormonal problems, such as low testosterone.  Obesity.  Nerve problems. This may include back or pelvic injuries, diabetes  mellitus, multiple sclerosis, or Parkinson disease. SYMPTOMS  Inability to get an erection.  Lack of enough hardness to allow penetration.  Loss of the erection before sex is finished.  Premature ejaculation.  Normal erections at some times, but with frequent unsatisfactory episodes.  Orgasms that are not satisfactory in sensation or frequency.  Low sexual satisfaction in either partner because of erection problems.  A curved penis occurring with erection. The curve may cause pain or may be too curved to allow for intercourse.  Never having nighttime erections. DIAGNOSIS Your caregiver can often diagnose this condition by:  Performing a physical exam to find other diseases or specific problems with the penis.  Asking you detailed questions about the problem.  Performing blood tests to check for diabetes mellitus or to measure hormone levels.  Performing urine tests to find other underlying health conditions.  Performing an ultrasound exam to check for scarring.  Performing a test to check blood flow to the penis.  Doing a sleep study at home to measure nighttime erections. TREATMENT   You may be prescribed medicines by mouth.  You may be given medicine injections into the penis.  You may be prescribed a vacuum pump with a ring.  Penile implant surgery may be performed. You may receive:  An inflatable implant.  A semirigid implant.  Blood vessel surgery may be performed. HOME CARE INSTRUCTIONS  If you are prescribed oral medicine, you should take the medicine as prescribed. Do not increase the dosage without first discussing it with your physician.  If you are using self-injections, be careful to avoid any veins that are on the surface of the penis. Apply pressure to the injection site for 5 minutes.  If you are using a vacuum pump, make sure you have read the instructions before using it. Discuss any questions with your physician before taking the pump  home. SEEK MEDICAL CARE IF:  You experience pain that is not responsive to the pain medicine you have been prescribed.  You experience nausea or vomiting. SEEK IMMEDIATE MEDICAL CARE IF:   When taking oral or injectable medications, you experience an erection that lasts longer than 4 hours. If your physician is unavailable, go to the nearest emergency room for evaluation. An erection that lasts much longer than 4 hours can result in permanent damage to your penis.  You have pain that is severe.  You develop redness, severe pain, or severe swelling of your penis.  You have redness spreading up into your groin or lower abdomen.  You are unable to pass your urine.  Document Released: 04/18/2000 Document Revised: 12/22/2012 Document Reviewed: 09/23/2012 [facility] Patient Information 2015 [place], [place]. This information is not intended to replace advice given to you by your health care provider. Make sure you discuss any questions you have with your health care provider.

## 2013-12-21 NOTE — Progress Notes (Signed)
Subjective:   HPI  Mitchell Hughes is a 48 y.o. male who presents for a complete physical.  Medical care team includes:  Dr. Milinda Pointer, podiatry  [employer] [place]  Dorothea Ogle, PA-C/Dr. Jill Alexanders here for primary care   Preventative care: Last ophthalmology visit: January Last dental visit: not had one in a while Last colonoscopy: never Last prostate exam: couple years Last EKG: never Last labs: last year  Prior vaccinations: TD or Tdap: n/a Influenza: last year  Concerns: ED - worse in last fwe months, but has had some difficulties for years getting and keeping erections.   Has used Cialis and Viagra both in the past with good results per Dr. Redmond School.   Reviewed their medical, surgical, family, social, medication, and allergy history and updated chart as appropriate.  Past Medical History  Diagnosis Date  . LOW BACK PAIN, ACUTE 09/26/2009  . RENAL CALCULUS, RIGHT 09/28/2009  . TOBACCO USE, QUIT 09/26/2009  . Wears glasses   . Family history of colon cancer     sister in 50s  . Family history of heart disease 2011    baseline cardiac eval 2011 with Dr. Sallyanne Kuster  . Erectile dysfunction   . History of exercise stress test 01/17/10    normal Bruce treadmill stress test, Dr. Debara Pickett    Past Surgical History  Procedure Laterality Date  . Right kidney stone  09/28/09    9.27mm on CT  . Foot surgery      bone spur, right; Dr. Milinda Pointer  . Wisdom tooth extraction      History   Social History  . Marital Status: Single    Spouse Name: N/A    Number of Children: N/A  . Years of Education: N/A   Occupational History  . Not on file.   Social History Main Topics  . Smoking status: Former Smoker -- 0.25 packs/day for 30 years    Quit date: 03/05/2009  . 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 Alcohol Use: 2.4 oz/week    4 Cans of beer per week  . Drug Use: No  . Sexual Activity: Not on file   Other  Topics Concern  . Not on file   Social History Narrative   Single, 2 children, works in a warehouse, exercise - treadmill, some weights, walking    Family History  Problem Relation Age of Onset  . Heart disease Other     parent, other relative  . Colon cancer Other   . Thyroid disease Mother   . Other Mother     brain disease, possibly dementia?  . Gallstones Mother   . Other Father     spinal cord injury/accident  . Heart disease Father 74  . Heart disease Sister 25    artificial valve  . Heart disease Brother 67    MI  . Diabetes Brother   . Stroke Neg Hx   . Hypertension Neg Hx   . Cancer Sister 80    colon  . Heart disease Sister   . Heart disease Brother 60    pacemaker    Current outpatient prescriptions:Ibuprofen (ADVIL PO), Take by mouth., Disp: , Rfl: ;  meloxicam (MOBIC) 15 MG tablet, Take 1 tablet (15 mg total) by mouth daily., Disp: 30 tablet, Rfl: 3  No Known Allergies     Review of Systems Constitutional: -fever, -chills, -sweats, -unexpected weight change, -decreased appetite, -fatigue Allergy: -  sneezing, -itching, -congestion Dermatology: -changing moles, --rash, -lumps ENT: -runny nose, -ear pain, -sore throat, -hoarseness, -sinus pain, -teeth pain, - ringing in ears, -hearing loss, -nosebleeds Cardiology: -chest pain, -palpitations, -swelling, -difficulty breathing when lying flat, -waking up short of breath Respiratory: -cough, -shortness of breath, -difficulty breathing with exercise or exertion, -wheezing, -coughing up blood Gastroenterology: -abdominal pain, -nausea, -vomiting, -diarrhea, -constipation, -blood in stool, -changes in bowel movement, -difficulty swallowing or eating Hematology: -bleeding, -bruising  Musculoskeletal: +joint aches, -muscle aches, -joint swelling, -back pain, -neck pain, -cramping, -changes in gait Ophthalmology: denies vision changes, eye redness, itching, discharge Urology: -burning with urination, -difficulty  urinating, -blood in urine, -urinary frequency, -urgency, -incontinence Neurology: -headache, -weakness, -tingling, -numbness, -memory loss, -falls, -dizziness Psychology: -depressed mood, -agitation, -sleep problems     Objective:   Physical Exam  BP 122/82  Pulse 72  Temp(Src) 98.1 F (36.7 C) (Oral)  Resp 16  Ht 6' 3.5" (1.918 m)  Wt 231 lb (104.781 kg)  BMI 28.48 kg/m2  General appearance: alert, no distress, WD/WN Skin: tattoo of mermaid and dolphin on left upper chest, few scattered macules, no worrisome lesions, small scar from right upper anterior thigh from prior cyst excision HEENT: normocephalic, conjunctiva/corneas normal, sclerae anicteric, PERRLA, EOMi, nares patent, no discharge or erythema, pharynx normal Oral cavity: MMM, tongue normal, teeth in good repair Neck: supple, no lymphadenopathy, no thyromegaly, no masses, normal ROM, no bruits Chest: non tender, normal shape and expansion Heart: RRR, normal S1, S2, no murmurs Lungs: CTA bilaterally, no wheezes, rhonchi, or rales Abdomen: +bs, soft, non tender, non distended, no masses, no hepatomegaly, no splenomegaly, no bruits Back: non tender, normal ROM, no scoliosis Musculoskeletal: upper extremities non tender, no obvious deformity, normal ROM throughout, lower extremities non tender, no obvious deformity, normal ROM throughout Extremities: no edema, no cyanosis, no clubbing Pulses: 2+ symmetric, upper and lower extremities, normal cap refill Neurological: alert, oriented x 3, CN2-12 intact, strength normal upper extremities and lower extremities, sensation normal throughout, DTRs 2+ throughout, no cerebellar signs, gait normal Psychiatric: normal affect, behavior normal, pleasant  GU: normal male external genitalia,uncircumcised, nontender, no masses, +small to medium right reducible inguinal hernia, no lymphadenopathy Rectal: anus normal, normal tone, prosate WNL   Assessment and Plan :    Encounter Diagnoses   Name Primary?  . Routine general medical examination at a health care facility Yes  . Need for prophylactic vaccination and inoculation against influenza   . Need for Tdap vaccination   . Special screening for malignant neoplasms, colon   . Family history of colon cancer   . Family history of heart disease   . Erectile dysfunction due to arterial insufficiency   . Unilateral inguinal hernia without obstruction or gangrene, recurrence not specified     Physical exam - discussed healthy lifestyle, diet, exercise, preventative care, vaccinations, and addressed their concerns.    Counseled on the influenza virus vaccine.  Vaccine information sheet given.  Influenza vaccine given after consent obtained.  Counseled on the Tdap (tetanus, diptheria, and acellular pertussis) vaccine.  Vaccine information sheet given. Tdap vaccine given after consent obtained.  Erectile Dysfunction - Reviewed pathophysiology and differential diagnosis of erectile dysfunction with the patient.  Discussed treatment options.  Begin trial of Stayxn and Stendra separately.  Discussed potential risks of medications including hypotension and priapism.  Discussed proper use of medication.  Questions were answered.  Recheck with call back.  Specific recommendations today include:  See eye doctor yearly  See dentist yearly  We will refer you for a screening colonoscopy given the family history  We updated your flu shot today. Get a flu shot yearly  We updated your Tdap shot today, tetanus, diptheria, pertussis.  This is good for 10 years.  Exercise regularly, eat a healthy low fat diet.  You have a right inguinal hernia on exam.  If this causes pain or gets bigger, then recheck.  Heavy lifting, prolonged toileting, straining can worsen the hernia  Follow-up pending labs

## 2013-12-21 NOTE — Telephone Encounter (Signed)
Sent referral to [facility] and called to make sure they got it, they will call pt to schedule

## 2013-12-21 NOTE — Addendum Note (Signed)
Addended by: Minette Headland A on: 12/21/2013 03:00 PM   Modules accepted: Orders

## 2013-12-22 LAB — PSA: PSA: 0.6 ng/mL (ref <=4.00)

## 2013-12-22 LAB — TSH: TSH: 0.735 u[IU]/mL (ref 0.350–4.500)

## 2013-12-22 LAB — TESTOSTERONE: TESTOSTERONE: 859 ng/dL (ref 300–890)

## 2013-12-23 ENCOUNTER — Encounter: Payer: Self-pay | Admitting: Internal Medicine

## 2013-12-29 DIAGNOSIS — M779 Enthesopathy, unspecified: Secondary | ICD-10-CM

## 2014-01-03 ENCOUNTER — Ambulatory Visit (INDEPENDENT_AMBULATORY_CARE_PROVIDER_SITE_OTHER): Payer: BC Managed Care – PPO

## 2014-01-03 ENCOUNTER — Encounter: Payer: Self-pay | Admitting: Podiatry

## 2014-01-03 ENCOUNTER — Ambulatory Visit: Payer: BC Managed Care – PPO | Admitting: Podiatry

## 2014-01-03 VITALS — BP 127/83 | HR 75 | Resp 16

## 2014-01-03 DIAGNOSIS — M79671 Pain in right foot: Secondary | ICD-10-CM

## 2014-01-03 DIAGNOSIS — M79609 Pain in unspecified limb: Secondary | ICD-10-CM

## 2014-01-03 DIAGNOSIS — M76829 Posterior tibial tendinitis, unspecified leg: Secondary | ICD-10-CM

## 2014-01-03 DIAGNOSIS — M76821 Posterior tibial tendinitis, right leg: Secondary | ICD-10-CM

## 2014-01-03 DIAGNOSIS — M779 Enthesopathy, unspecified: Secondary | ICD-10-CM

## 2014-01-03 NOTE — Patient Instructions (Signed)
ICE INSTRUCTIONS  Apply ice or cold pack to the affected area at least 3 times a day for 10-15 minutes each time.  You should also use ice after prolonged activity or vigorous exercise.  Do not apply ice longer than 20 minutes at one time.  Always keep a cloth between your skin and the ice pack to prevent burns.  Being consistent and following these instructions will help control your symptoms.  We suggest you purchase a gel ice pack because they are reusable and do bit leak.  Some of them are designed to wrap around the area.  Use the method that works best for you.  Here are some other suggestions for icing.   Use a frozen bag of peas or corn-inexpensive and molds well to your body, usually stays frozen for 10 to 20 minutes.  Wet a towel with cold water and squeeze out the excess until it's damp.  Place in a bag in the freezer for 20 minutes. Then remove and use.  Alternate applying a hot compress and ice pack 10 minutes each hot cold hot cold repeat 2 or 3 times

## 2014-01-03 NOTE — Progress Notes (Signed)
   Subjective:    Patient ID: Mitchell Hughes, male    DOB: July 07, 1965, 48 y.o.   MRN: [id number]  HPI Comments: "Its still sore, but its coming along"  Follow up Insertional tibialis anterior tendonitis right foot   Foot Pain      Review of Systems No new findings or significant systemic changes noted    Objective:   Physical Exam Lower extremity objective findings as follows neurovascular status is intact pedal pulses are palpable epicritic and proprioceptive sensations intact patient indicates that he'll better while the boot was on and he's completed his regimen of the Dosepak however still taking meloxicam. Indicates is still painful although some improvement on exam the tibialis anterior insertion has no pain reproducible symptoms the only symptoms occur at this time or more plantar to the navicular and along the medial navicular and along the posterior tibial tendon distribution inferior to the medial malleolus on the right foot and ankle. Again most likely posterior tibial rather than anterior tibial at today's visit compared to previous notes. At this time patient wearing athletic shoe appears adequate however may be wearing boots at to not have a steel shank or solid arch noted no barefoot or flimsy shoes or flip-flops       Assessment & Plan:  Assessment cannot rule out posterior tibial tendinitis insertional tendinitis of the posterior tibial tendon the medial navicular plantar navicular. At this time fascial strapping applied to the right foot provide some relief patient will be reassessed within 1 week by Dr. Milinda Hughes if taping helps would be a strong candidate for functional orthoses. Also recommended crocs for around the house no barefoot no flimsy shoes or flip-flops this may be secondary promontory changes of the foot walking barefoot around the house. Also his work boots may not have a solid steel shank. We'll reevaluate with Dr. Dellia Hughes within 1 week and possible followup with  orthoses recommended hot cold compress and meloxicam next progress Mitchell Hughes DPM

## 2014-01-12 ENCOUNTER — Ambulatory Visit (INDEPENDENT_AMBULATORY_CARE_PROVIDER_SITE_OTHER): Payer: BC Managed Care – PPO | Admitting: Podiatry

## 2014-01-12 ENCOUNTER — Encounter: Payer: Self-pay | Admitting: Podiatry

## 2014-01-12 VITALS — BP 142/86 | HR 72 | Resp 12

## 2014-01-12 DIAGNOSIS — M722 Plantar fascial fibromatosis: Secondary | ICD-10-CM

## 2014-01-12 MED ORDER — HYDROCODONE-ACETAMINOPHEN 5-325 MG PO TABS: 1.0000 | ORAL_TABLET | ORAL | Status: DC | PRN

## 2014-01-12 NOTE — Progress Notes (Signed)
Mitchell Hughes presents today states that some days are good and some days are bad as he refers to his right foot. He states he continues all conservative therapies that we have requested of him and this portion of his foot feels much better as he points to the tibialis anterior tendon at its insertion site. He states the majority of the pain is now below that as he points to just below the first metatarsal medial cuneiform joint.  Objective: Vital signs are stable he is alert and oriented x3. Pulses are palpable right foot. After the previous injection much of the edema and tenosynovitis of the tibialis anterior has decreased and inflammation has gone down along the medial aspect of his right foot. I am now able to better palpate a soft tissue mass beneath the plantar medial aspect of the first metatarsal medial cuneiform area. 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 Previous radiographs once reviewed do not demonstrate any spiculation or calcification indicative of carcinoma.  Assessment: Soft tissue mass beneath the first metatarsal medial cuneiform joint. Resolving tibialis anterior tendinitis.  Plan: Discussed etiology pathology conservative versus surgical therapies. At this point I believe he should be able to return to work Monday of next week however I am requesting an MRI of his right foot to evaluate this mass for possible surgical excision. I will followup with him with the findings are return to [place]. She's to continue all conservative therapies until that time.

## 2014-01-16 ENCOUNTER — Telehealth: Payer: Self-pay | Admitting: *Deleted

## 2014-01-16 NOTE — Telephone Encounter (Signed)
Need authorization for MRI scheduled for tomorrow CPT Code is 20813 with and without contrast.  I called and got authorization.  I spoke to Mila Doce.  Authorization number is [id number], approved until 02/14/2014.  I called and informed Sherri, who stated she would give message to [place].

## 2014-01-17 ENCOUNTER — Other Ambulatory Visit: Payer: Managed Care, Other (non HMO)

## 2014-01-25 ENCOUNTER — Ambulatory Visit
Admission: RE | Admit: 2014-01-25 | Discharge: 2014-01-25 | Disposition: A | Discharge status: Home / Self Care | Payer: BC Managed Care – PPO | Source: Ambulatory Visit | Attending: Podiatry | Admitting: Podiatry

## 2014-01-25 DIAGNOSIS — M722 Plantar fascial fibromatosis: Secondary | ICD-10-CM

## 2014-01-25 MED ORDER — GADOBENATE DIMEGLUMINE 529 MG/ML IV SOLN
10.0000 mL | Freq: Once | INTRAVENOUS | Status: AC | PRN
  Administered 2014-01-25: 10 mL via INTRAVENOUS

## 2014-01-26 ENCOUNTER — Telehealth: Payer: Self-pay | Admitting: *Deleted

## 2014-01-26 NOTE — Telephone Encounter (Signed)
Message copied by Lolita Rieger on Thu Jan 26, 2014 10:52 AM ------      Message from: Tyson Dense T      Created: Wed Jan 25, 2014  4:37 PM       Gianluca Chhim let him know that you are sending for an over read.  The first MRI did not show anything.  Keep this patient updated and please send for over read. ------

## 2014-01-26 NOTE — Telephone Encounter (Signed)
I called and left the patient a message that Dr. Milinda Pointer received the MRI results.  He wants to send it to another physician to have it re-read at [facility].  We just wanted to make you aware of the delay.  Please call if you have any questions or concerns.  Disk was sent to [facility] today.

## 2014-01-30 ENCOUNTER — Telehealth: Payer: Self-pay | Admitting: *Deleted

## 2014-01-30 NOTE — Telephone Encounter (Signed)
I dropped off some MRA results.  I am just wondering if Dr. Milinda Pointer reviewed those?  If so, give me a call to confirm.  I called and informed the patient that I had left him a message Thursday of last week.  Dr. Milinda Pointer got the results, he wants to have it re-read so we sent it to [facility] for a second opinion.  Dr. Milinda Pointer is not in the office this week but he will be on Tuesday of next week and hopefully review it then.  He stated, "So you all will call me with the results then?"  I told him yes.

## 2014-02-14 ENCOUNTER — Encounter: Payer: Managed Care, Other (non HMO) | Admitting: Internal Medicine

## 2014-02-16 ENCOUNTER — Encounter: Payer: Self-pay | Admitting: Podiatry

## 2014-02-16 ENCOUNTER — Ambulatory Visit (INDEPENDENT_AMBULATORY_CARE_PROVIDER_SITE_OTHER): Payer: BC Managed Care – PPO | Admitting: Podiatry

## 2014-02-16 VITALS — BP 135/88 | HR 88 | Resp 16

## 2014-02-16 DIAGNOSIS — M722 Plantar fascial fibromatosis: Secondary | ICD-10-CM

## 2014-02-16 NOTE — Progress Notes (Signed)
   Subjective:    Patient ID: Mitchell Hughes, male    DOB: 08-14-1965, 48 y.o.   MRN: [id number]  HPI PT PRESENTS TO DISCUSS MRI RESULTS   Review of Systems     Objective:   Physical Exam: MRI report was negative for soft tissue mass plantar aspect of the right foot. Pulses are strongly palpable right foot. Obvious mass is present to the plantar medial aspect just beneath the first metatarsal medial cuneiform joint. 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  Plan: Injected the area today with Kenalog and local anesthetic I will followup with him in 6 weeks.

## 2014-02-20 ENCOUNTER — Encounter: Payer: Self-pay | Admitting: Podiatry

## 2014-03-16 ENCOUNTER — Ambulatory Visit: Payer: BC Managed Care – PPO | Admitting: Podiatry

## 2014-03-23 ENCOUNTER — Encounter: Payer: Self-pay | Admitting: Medical

## 2014-03-23 ENCOUNTER — Ambulatory Visit
Admission: RE | Admit: 2014-03-23 | Discharge: 2014-03-23 | Disposition: A | Discharge status: Home / Self Care | Payer: BC Managed Care – PPO | Source: Ambulatory Visit | Attending: Medical | Admitting: Medical

## 2014-03-23 ENCOUNTER — Ambulatory Visit (INDEPENDENT_AMBULATORY_CARE_PROVIDER_SITE_OTHER): Payer: BC Managed Care – PPO | Admitting: Medical

## 2014-03-23 VITALS — BP 120/80 | HR 72 | Temp 98.0°F | Resp 16 | Wt 234.0 lb

## 2014-03-23 DIAGNOSIS — R0602 Shortness of breath: Secondary | ICD-10-CM

## 2014-03-23 LAB — CBC
HEMATOCRIT: 42.6 % (ref 39.0–52.0)
HEMOGLOBIN: 14.9 g/dL (ref 13.0–17.0)
MCH: 30.2 pg (ref 26.0–34.0)
MCHC: 35 g/dL (ref 30.0–36.0)
MCV: 86.4 fL (ref 78.0–100.0)
MPV: 10.7 fL (ref 9.4–12.4)
Platelets: 242 10*3/uL (ref 150–400)
RBC: 4.93 MIL/uL (ref 4.22–5.81)
RDW: 13.3 % (ref 11.5–15.5)
WBC: 5.9 10*3/uL (ref 4.0–10.5)

## 2014-03-23 LAB — BASIC METABOLIC PANEL
BUN: 20 mg/dL (ref 6–23)
CHLORIDE: 105 meq/L (ref 96–112)
CO2: 21 meq/L (ref 19–32)
CREATININE: 1.13 mg/dL (ref 0.50–1.35)
Calcium: 9.8 mg/dL (ref 8.4–10.5)
GLUCOSE: 95 mg/dL (ref 70–99)
POTASSIUM: 4.2 meq/L (ref 3.5–5.3)
Sodium: 141 mEq/L (ref 135–145)

## 2014-03-23 LAB — D-DIMER, QUANTITATIVE (NOT AT ARMC): D-Dimer, Quant: 0.27 ug/mL-FEU (ref 0.00–0.48)

## 2014-03-23 LAB — BRAIN NATRIURETIC PEPTIDE: Brain Natriuretic Peptide: 16.2 pg/mL (ref 0.0–100.0)

## 2014-03-23 NOTE — Progress Notes (Signed)
Subjective:    Mitchell Hughes is a 48 y.o. male who presents for evaluation of shortness of breath. He reports shortness of breath for about the last 3+ weeks.  Worse with activity or exercise. Prior to 3 weeks ago he had been on vacation at the [place], [place].  Since coming back for the beach, thought he was getting a cold.    Has been having a harder time to catch his breath than prior to 3 weeks ago. Symptoms are relieved with rest.  Associated symptoms include: diaphoresis. The symptoms are aggravated by exercise, and relieved by rest.  No other aggravating or relieving factors. No other complaint.  Patient's cardiac risk factors are: family history of premature cardiovascular disease, male gender and former smoker. Patient's risk factors for DVT/PE: long duration of automobile or plane travel. Previous cardiac testing: electrocardiogram (ECG) and exercise stress test 2011.  The following portions of the patient's history were reviewed and updated as appropriate: allergies, current medications, past family history, past medical history, past social history, past surgical history and problem list.  Review of Systems Constitutional: denies fever, chills, sweats, unexpected weight change, anorexia,+ fatigue ENT: no runny nose, ear pain, sore throat, hoarseness, sinus pain, hearing loss, epistaxis Cardiology: denies palpitations, edema, orthopnea, paroxysmal nocturnal dyspnea Respiratory: denies cough, +shortness of breath, +dyspnea on exertion, wheezing, hemoptysis Gastroenterology: denies abdominal pain, nausea, vomiting, diarrhea, constipation,  Hematology: denies bleeding or bruising problems Musculoskeletal: denies arthralgias, myalgias, joint swelling, back pain, neck pain, cramping, gait changes Ophthalmology: denies vision changes Urology: denies dysuria, difficulty urinating, hematuria, urinary frequency, urgency, incontinence Neurology: no headache, weakness, tingling, numbness,  speech abnormality, memory loss, falls, dizziness Psychology: denies depressed mood, agitation, sleep problems   Past Medical History  Diagnosis Date  . LOW BACK PAIN, ACUTE 09/26/2009  . RENAL CALCULUS, RIGHT 09/28/2009  . TOBACCO USE, QUIT 09/26/2009  . Wears glasses   . Family history of colon cancer     sister in 34s  . Family history of heart disease 2011    baseline cardiac eval 2011 with Dr. Sallyanne Kuster  . Erectile dysfunction   . History of exercise stress test 01/17/10    normal Bruce treadmill stress test, Dr. Debara Pickett    Objective:     BP 120/80 mmHg  Pulse 72  Temp(Src) 98 F (36.7 C) (Oral)  Resp 16  Wt 234 lb (106.142 kg)  SpO2 98%  General appearance: alert, no distress, WD/WN, male  HEENT: normocephalic, conjunctiva/corneas normal, sclerae anicteric, PERRLA, EOMi, nares patent, no discharge or erythema, pharynx normal Oral cavity: MMM, tongue normal, teeth normal Neck: supple, no lymphadenopathy, no thyromegaly, no JVD, no bruits, no masses, normal ROM Heart: RRR, normal S1, S2, no murmurs Lungs: CTA bilaterally, no wheezes, rhonchi, or rales Abdomen: +bs, soft, non tender, non distended, no masses, no hepatomegaly, no splenomegaly, no bruits Extremities: no edema, no cyanosis, no clubbing Pulses: 2+ symmetric, upper and lower extremities, normal cap refill Neurological: alert, oriented x 3, CN2-12 intact, nonfocal exam Psychiatric: normal affect, behavior normal, pleasant     Adult ECG Report  Indication: SOB  Rate: 70 bpm  Rhythm: normal sinus rhythm  QRS Axis: -36 degrees  PR Interval: 12ms  QRS Duration: 176ms  QTc: 46ms  Conduction Disturbances: none  Other Abnormalities: left axis deviation, T wave inversion III  Patient's cardiac risk factors are: family history of premature cardiovascular disease and male gender.  EKG comparison: 2011  Narrative Interpretation: no acute changes, left axis deviation  Assessment:   Encounter Diagnosis   Name Primary?  . Shortness of breath Yes     Plan   Discussed his concerns, syptoms, and exam is unremarkable.  Differential is wide, can't rule out serious causes.  Reviewed prior 2011 cardiology evaluations, prior labs.  Will send for STAT labs, CXR, and EKG reviewed.  If worse symptoms suggestive of acute coronary syndrome as discussed then call 911.  otherwise we will call with results.  Advised rest for now.

## 2014-03-24 ENCOUNTER — Other Ambulatory Visit: Payer: Self-pay | Admitting: Family Medicine

## 2014-03-24 ENCOUNTER — Encounter: Payer: Self-pay | Admitting: Medical

## 2014-03-24 DIAGNOSIS — R0602 Shortness of breath: Secondary | ICD-10-CM

## 2014-03-28 ENCOUNTER — Encounter: Payer: Self-pay | Admitting: Cardiology

## 2014-03-28 NOTE — Progress Notes (Signed)
Patient ID: Mitchell Hughes, male   DOB: 12-Jul-1965, 48 y.o.   MRN: [id number]           Mitchell Hughes  Date of visit:  03/28/2014 DOB:  Apr 25, 1966    Age:  48 yrs. Medical record number:  [id number]     Account number:  [id number] Primary Care Provider: Robyne Askew ____________________________ CURRENT DIAGNOSES  1. Dyspnea ____________________________ ALLERGIES  No Known Allergies ____________________________ MEDICATIONS  1. multivitamin tablet, 3 x week ____________________________ CHIEF COMPLAINTS  Dyspnea with exertion began 3 weeks ago ____________________________ HISTORY OF PRESENT ILLNESS This very nice 48 year old male is seen for evaluation of exertional dyspnea. The patient has previously been in good health except he is mildly overweight. He normally works out and has been working out with a Physiological scientist but recently developed a three-week history of increasing dyspnea with exertion and was not able to do activities working out without becoming severely short of breath. He was seen at his primary 48 office and had a normal chest x-ray and lab was unremarkable including a low BNP and a low d-dimer. He denies PND orthopnea or edema. He has no significant claudication. He did smoke for a number of years and quit about 4 years ago. ____________________________ PAST HISTORY  Past Medical Illnesses:  kidney stones, erectile dysfunction, obesity, lumbar disc disease;  Cardiovascular Illnesses:  no previous history of cardiac disease.;  Surgical Procedures:  rt foot surg;  Cardiology Procedures-Invasive:  no history of prior cardiac procedures;  Cardiology Procedures-Noninvasive:  treadmill;  LVEF not documented,   ____________________________ CARDIO-PULMONARY TEST DATES EKG Date:  03/28/2014;  Chest Xray Date: 03/23/2014;   ____________________________ FAMILY HISTORY Brother -- Brother alive with problem, Pacemaker in situ Brother -- Brother alive with problem, Heart  Attack, Diabetes mellitus type 1 Father -- Father dead, Heart disease Mother -- Mother dead, Brain disorder Sister -- Sister alive with problem, Repair of aortic valve ____________________________ SOCIAL HISTORY Alcohol Use:  socially;  Smoking:  used to smoke but quit 2011, 20 pack year history;  Diet:  regular diet;  Lifestyle:  divorced;  Exercise:  running/jogging for approximately 60 minutes 5 days per week and weight lifting for approximately 60 minutes 5 days per week;  Occupation:  receiving dept;   ____________________________ REVIEW OF SYSTEMS General:  weight gain of approximately 10 lbs  Integumentary:no rashes or new skin lesions. Eyes: wears eye glasses/contact lenses Ears, Nose, Throat, Mouth:  denies any hearing loss, epistaxis, hoarseness or difficulty speaking. Respiratory: denies dyspnea, cough, wheezing or hemoptysis. Cardiovascular:  please review HPI Abdominal: denies dyspepsia, GI bleeding, constipation, or diarrhea Genitourinary-Male: erectile dysfunction  Musculoskeletal:  chronic low back pain, right foot bone spurs Neurological:  denies headaches, stroke, or TIA  ____________________________ PHYSICAL EXAMINATION VITAL SIGNS  Blood Pressure:  114/70 Sitting, Right arm, regular cuff  , 110/72 Standing, Right arm and regular cuff   Pulse:  76/min. Weight:  233.00 lbs. Height:  77"BMI: 27  Constitutional:  pleasant white male in no acute distress Skin:  warm and dry to touch, no apparent skin lesions, or masses noted. Head:  normocephalic, normal hair pattern, no masses or tenderness Eyes:  EOMS Intact, PERRLA, C and S clear, Funduscopic exam not done. ENT:  ears, nose and throat reveal no gross abnormalities.  Dentition good. Neck:  supple, without massess. No JVD, thyromegaly or carotid bruits. Carotid upstroke normal. Chest:  normal symmetry, clear to auscultation. Cardiac:  regular rhythm, normal S1 and S2, No S3 or S4, no  murmurs, gallops or rubs detected. Abdomen:   abdomen soft,non-tender, no masses, no hepatospenomegaly, or aneurysm noted Peripheral Pulses:  the femoral,dorsalis pedis, and posterior tibial pulses are full and equal bilaterally with no bruits auscultated. Extremities & Back:  no deformities, clubbing, cyanosis, erythema or edema observed. Normal muscle strength and tone. Neurological:  no gross motor or sensory deficits noted, affect appropriate, oriented x3. ____________________________ MOST RECENT LIPID PANEL 12/21/13  CHOL TOTL 173 mg/dl, LDL 113 NM, HDL 47 mg/dl, TRIGLYCER 65 mg/dl and CHOL/HDL 3.7 (Calc) ____________________________ IMPRESSIONS/PLAN  1. New-onset exertional dyspnea that could be an anginal equivalent. We need to look at his LV function and determine if there any other reasons for him to have dyspnea 2. Mildly overweight  Recommendations:  Recommended that we start with an echocardiogram and a standard treadmill test since he has a normal EKG today. I asked him not to work out until after he gets his exercise test. ____________________________ TODAYS ORDERS  1. 2D, color flow, doppler: First Available  2. treadmill:  Regular TM First Available  3. 12 Lead EKG: Today                       ____________________________ Cardiology Physician:  Kerry Hough MD [facility]

## 2014-04-10 ENCOUNTER — Encounter: Payer: Self-pay | Admitting: Cardiology

## 2014-04-10 NOTE — Progress Notes (Signed)
Patient ID: Mitchell Hughes, male   DOB: 05-Aug-1965, 48 y.o.   MRN: [id number]  Nena Polio    Date of visit:  04/10/2014 DOB:  [birth year]    Age:  45 yrs. Medical record number:  [id number]     Account number:  [id number] Primary Care Provider: [place]  1. Dyspnea  HISTORY OF PRESENT ILLNESS The patient has improved in his dyspnea and is no longer dyspneic like he was earlier when he was seen.  TREADMILL  The patient exercised on the standard Bruce protocol a total of 12 minutes into Bruce stage 4 achieving a workload of 14 METS. The test was stopped due to dyspnea and fatigue. The patient had no chest pain suggestive of angina. Heart rate rose to 156 which was 92% of predicted maximum. Blood pressure response was normal.  12-lead EKG is normal at rest. With exercise the patient achieved target heart rate and had no ST segment depression consistent with ischemia. No arrhythmias occurred.  IMPRESSIONS:  1. Clinically and EKG negative for ischemia 2. Good exercise capacity for age  Recommendations:  The patient has a negative adequate exercise treadmill test with no evidence of myocardial ischemia. His symptoms have resolved.  Echocardiogram today shows a slightly calcified aortic valve with mild aortic regurgitation. In addition his aortic root was mildly dilated at 39 mm. He is quite tall but I would recommend that he have a repeat echocardiogram in 2 years to evaluate his aorta as well as his aortic valve calcification and regurgitation.   He may be involved in regular exercise. Thank you for asking me to see him with you.   MOST RECENT LIPID PANEL 12/21/13  CHOL TOTL 173 mg/dl, LDL 113 NM, HDL 47 mg/dl, TRIGLYCER 65 mg/dl and CHOL/HDL 3.7 (Calc)   Cardiology Physician:  Kerry Hough. MD [facility]

## 2014-04-10 NOTE — Progress Notes (Signed)
I reviewed the cardiology notes.   If he is still having the symptoms despite normal cardiac eval, the next step is to check lung function/PFTs.    See if we need to go to this step?

## 2014-04-11 NOTE — Progress Notes (Signed)
LMOM TO CB. CLS 

## 2014-04-12 ENCOUNTER — Other Ambulatory Visit: Payer: Self-pay | Admitting: Family Medicine

## 2014-04-12 DIAGNOSIS — R0602 Shortness of breath: Secondary | ICD-10-CM

## 2014-04-12 NOTE — Progress Notes (Signed)
Patient is aware of Dorothea Ogle PA message. Patient did agree to the PFT referral. Patient's appointment is on 04/26/14 @ 400 pm [facility]

## 2014-04-13 ENCOUNTER — Telehealth: Payer: Self-pay | Admitting: Family Medicine

## 2014-04-13 NOTE — Telephone Encounter (Signed)
Pt returned call to you regarding the lung referral.  Please call pt [phone].

## 2014-04-17 NOTE — Telephone Encounter (Signed)
I left appointment details on the patients voicemail . [facility] 04/26/14 @ 400 pm [address], [place]

## 2014-05-09 ENCOUNTER — Ambulatory Visit (INDEPENDENT_AMBULATORY_CARE_PROVIDER_SITE_OTHER): Payer: Self-pay | Admitting: Internal Medicine

## 2014-05-09 DIAGNOSIS — R0602 Shortness of breath: Secondary | ICD-10-CM

## 2014-05-09 LAB — PULMONARY FUNCTION TEST
DL/VA % PRED: 91 %
DL/VA: 4.56 ml/min/mmHg/L
DLCO unc % pred: 85 %
DLCO unc: 35.77 ml/min/mmHg
FEF 25-75 Post: 3.55 L/sec
FEF 25-75 Pre: 3.16 L/sec
FEF2575-%Change-Post: 12 %
FEF2575-%Pred-Post: 88 %
FEF2575-%Pred-Pre: 78 %
FEV1-%CHANGE-POST: 2 %
FEV1-%Pred-Post: 98 %
FEV1-%Pred-Pre: 96 %
FEV1-Post: 4.15 L
FEV1-Pre: 4.05 L
FEV1FVC-%Change-Post: 1 %
FEV1FVC-%Pred-Pre: 94 %
FEV6-%Change-Post: 0 %
FEV6-%Pred-Post: 103 %
FEV6-%Pred-Pre: 103 %
FEV6-PRE: 5.32 L
FEV6-Post: 5.36 L
FEV6FVC-%Change-Post: 0 %
FEV6FVC-%PRED-PRE: 102 %
FEV6FVC-%Pred-Post: 102 %
FVC-%CHANGE-POST: 0 %
FVC-%PRED-POST: 102 %
FVC-%PRED-PRE: 101 %
FVC-POST: 5.41 L
FVC-PRE: 5.36 L
POST FEV6/FVC RATIO: 99 %
Post FEV1/FVC ratio: 77 %
Pre FEV1/FVC ratio: 76 %
Pre FEV6/FVC Ratio: 99 %
RV % pred: 81 %
RV: 1.98 L
TLC % pred: 90 %
TLC: 7.58 L

## 2014-05-09 NOTE — Progress Notes (Signed)
PFT done today. 

## 2014-05-11 NOTE — Progress Notes (Signed)
LM to CB

## 2014-05-17 ENCOUNTER — Encounter: Payer: Self-pay | Admitting: Family Medicine

## 2014-05-17 ENCOUNTER — Telehealth: Payer: Self-pay | Admitting: Family Medicine

## 2014-05-17 NOTE — Telephone Encounter (Signed)
Call and see how he is doing, breathing?  The recent PFT showed mild abnormality, no major finding suggesting the need for breathing treatments.

## 2014-05-17 NOTE — Telephone Encounter (Signed)
Patient states that he is doing 110% better. Patient is aware of his PFT results in detail.

## 2014-08-15 ENCOUNTER — Encounter: Payer: Self-pay | Admitting: Medical

## 2014-08-15 ENCOUNTER — Ambulatory Visit (INDEPENDENT_AMBULATORY_CARE_PROVIDER_SITE_OTHER): Payer: BLUE CROSS/BLUE SHIELD | Admitting: Medical

## 2014-08-15 VITALS — BP 100/70 | HR 60 | Resp 15 | Wt 218.0 lb

## 2014-08-15 DIAGNOSIS — M79671 Pain in right foot: Secondary | ICD-10-CM

## 2014-08-15 DIAGNOSIS — L84 Corns and callosities: Secondary | ICD-10-CM | POA: Diagnosis not present

## 2014-08-15 NOTE — Progress Notes (Signed)
   Subjective:   Mitchell Hughes is a 49 y.o. male presenting on 08/15/2014 with SPOT ON BOTTOM OF RIGHT FOOT  He notes about 3 week history of pain on the bottom of his right foot. He notes several weeks ago maybe even a few months ago breaking a light bulb in his house and thinks he maybe stepped on some small piece of glass which he pulled out of his foot area wasn't very deep and thinks he got it all out. He doesn't recall any immediate redness pain or swelling to week or 2 after stepping on the small chunk of glass. However in the past 3 weeks though he has had persistent right foot pain a small area of red brown coloration and then using doughnut pads to take pressure off the area of the foot. Has tried a little wart remover on the area.  No prior similar.  No other aggravating or relieving factors.  No other complaint.  Review of Systems ROS as in subjective      Objective:   Filed Vitals:   08/15/14 0954  BP: 100/70  Pulse: 60  Resp: 15    General appearance: alert, no distress, WD/WN Right foot volar surface over the second metatarsal distally with 1 cm round area of more pearly color, quite tender to palpation over the area and somewhat discolored red-brown central area suggestive of a corn tissue seems a little thicker here, no obvious foreign body no obvious swelling or redness or wound     Assessment: Encounter Diagnoses  Name Primary?  . Corn of foot Yes  . Right foot pain      Plan: Discussed the fact that I can't rule out foreign body given he stepped on glass but the symptoms and presentation would suggest otherwise. Exam consistent with a corn. Discussed options for therapy including foot soaks and pumice stone, cryotherapy, cautery, over-the-counter wart remover topically. He would like to move forward cryotherapy today.  Discussed the risk and benefits of procedure, used Verruca-Freeze application to the right volar foot corn. Patient tolerated procedure well.  Discussed follow-up care, wound care  Follow-up in 2 weeks, sooner if needed  Mitchell Hughes was seen today for spot on bottom of right foot.  Diagnoses and all orders for this visit:  Corn of foot  Right foot pain   Return if symptoms worsen or fail to improve.

## 2014-09-04 ENCOUNTER — Encounter: Payer: Self-pay | Admitting: Medical

## 2014-09-04 ENCOUNTER — Ambulatory Visit (INDEPENDENT_AMBULATORY_CARE_PROVIDER_SITE_OTHER): Payer: BLUE CROSS/BLUE SHIELD | Admitting: Medical

## 2014-09-04 VITALS — BP 100/68 | HR 65 | Resp 15 | Wt 219.0 lb

## 2014-09-04 DIAGNOSIS — N528 Other male erectile dysfunction: Secondary | ICD-10-CM | POA: Diagnosis not present

## 2014-09-04 DIAGNOSIS — B356 Tinea cruris: Secondary | ICD-10-CM

## 2014-09-04 MED ORDER — TERBINAFINE HCL 250 MG PO TABS: 250.0000 mg | ORAL_TABLET | Freq: Every day | ORAL | Status: DC

## 2014-09-04 MED ORDER — SILDENAFIL CITRATE 50 MG PO TABS: 50.0000 mg | ORAL_TABLET | Freq: Every day | ORAL | Status: DC | PRN

## 2014-09-04 MED ORDER — CLOTRIMAZOLE-BETAMETHASONE 1-0.05 % EX CREA: 1.0000 "application " | TOPICAL_CREAM | Freq: Two times a day (BID) | CUTANEOUS | Status: DC

## 2014-09-04 NOTE — Progress Notes (Signed)
Subjective Here for jock itch.  Has been dealing with this for over a month.  Has used various creams without benefit OTC.  Is a little better today.  Can only wear boxer briefs.  Can't seem to tolerate plain boxers, has been using drying powders throughout the day at work.   Baths daily  Still having problems with ED.  Has used stendra and staxyn samples prior.  He recalls both tablets helped some but the dissolving one worked better.   Gets a semi erect penis in the morning, but not normal as in the past.  With intercourse can't penetrate, can reach orgasm but never with full erection.  No current cardiac symptoms.  Sleeps ok but only gets about 6 hours of sleep nightly.  relationship with girlfriend is fine.  No other aggravating or relieving factors. No other complaint.  Past Medical History  Diagnosis Date  . LOW BACK PAIN, ACUTE 09/26/2009  . RENAL CALCULUS, RIGHT 09/28/2009  . TOBACCO USE, QUIT 09/26/2009  . Wears glasses   . Family history of colon cancer     sister in 15s  . Family history of heart disease 2011    baseline cardiac eval 2011 with Dr. Sallyanne Kuster  . Erectile dysfunction   . History of exercise stress test 01/17/10    normal Bruce treadmill stress test, Dr. Debara Pickett   ROS as in subjective   Objective: BP 100/68 mmHg  Pulse 65  Resp 15  Wt 219 lb (99.338 kg)  Gen: wd, wn,nad Skin: erythema and irritation of skin of scrotum and bilat upper thighs  Heart: RRR, normal S1, S2, no murmurs Lungs clear Pulses normal GU normal, circumcised, no mass, no hernia, otherwise normal   Assessment: Encounter Diagnoses  Name Primary?  . Tinea cruris Yes  . Other male erectile dysfunction     Plan: Tinea cruris - begin 30 day course of Lamisil, Lotrisone topica x 7-10 days, c/t to keep the groin area dry, bath daily, f/u if not improving in 1-2 wk.  Discussed that Lotrisone is short term though for flareups.    Erectile Dysfunction - Reviewed pathophysiology and differential  diagnosis of erectile dysfunction with the patient.  Reviewed prior eval we have done here.  Discussed treatment options.  Begin trial of Viagra.  Discussed potential risks of medications including hypotension and priapism.  Discussed proper use of medication.  Questions were answered.  Recheck with call back.  Has done fine on stendra and staxyn prior.

## 2014-09-14 ENCOUNTER — Telehealth: Payer: Self-pay | Admitting: Medical

## 2014-09-27 NOTE — Telephone Encounter (Signed)
P.A. Approved til 12/20/14, faxed pharmacy, left message for pt

## 2014-10-03 ENCOUNTER — Other Ambulatory Visit: Payer: Self-pay | Admitting: Medical

## 2014-10-03 ENCOUNTER — Telehealth: Payer: Self-pay | Admitting: Family Medicine

## 2014-10-03 MED ORDER — SILDENAFIL CITRATE 100 MG PO TABS: ORAL_TABLET | ORAL | Status: DC

## 2014-10-03 NOTE — Telephone Encounter (Signed)
Patient called and said that he was given samples of Viagra 50 mg and was ask to cut in half and try 25 mg first and then try the 50 mg. Patient states that the 50 mg dose works better for him. He also states that if the 50 mg works that you would send in a Rx for the 100 mg dose and he could half the 100 mg. Patient requesting a Rx refill on Viagra 100 mg.

## 2015-06-25 ENCOUNTER — Ambulatory Visit (INDEPENDENT_AMBULATORY_CARE_PROVIDER_SITE_OTHER): Payer: BLUE CROSS/BLUE SHIELD | Admitting: Medical

## 2015-06-25 ENCOUNTER — Encounter: Payer: Self-pay | Admitting: Medical

## 2015-06-25 ENCOUNTER — Encounter: Payer: Self-pay | Admitting: Gastroenterology

## 2015-06-25 VITALS — BP 100/78 | HR 72 | Ht 75.0 in | Wt 227.0 lb

## 2015-06-25 DIAGNOSIS — N5201 Erectile dysfunction due to arterial insufficiency: Secondary | ICD-10-CM | POA: Diagnosis not present

## 2015-06-25 DIAGNOSIS — Z8249 Family history of ischemic heart disease and other diseases of the circulatory system: Secondary | ICD-10-CM

## 2015-06-25 DIAGNOSIS — Z0000 Encounter for general adult medical examination without abnormal findings: Secondary | ICD-10-CM

## 2015-06-25 DIAGNOSIS — Z125 Encounter for screening for malignant neoplasm of prostate: Secondary | ICD-10-CM

## 2015-06-25 DIAGNOSIS — Z122 Encounter for screening for malignant neoplasm of respiratory organs: Secondary | ICD-10-CM | POA: Insufficient documentation

## 2015-06-25 DIAGNOSIS — Z1159 Encounter for screening for other viral diseases: Secondary | ICD-10-CM | POA: Insufficient documentation

## 2015-06-25 DIAGNOSIS — Z1211 Encounter for screening for malignant neoplasm of colon: Secondary | ICD-10-CM

## 2015-06-25 DIAGNOSIS — R202 Paresthesia of skin: Secondary | ICD-10-CM

## 2015-06-25 DIAGNOSIS — Z800 Family history of malignant neoplasm of digestive organs: Secondary | ICD-10-CM

## 2015-06-25 LAB — CBC
HCT: 44.4 % (ref 39.0–52.0)
Hemoglobin: 15.3 g/dL (ref 13.0–17.0)
MCH: 30.1 pg (ref 26.0–34.0)
MCHC: 34.5 g/dL (ref 30.0–36.0)
MCV: 87.4 fL (ref 78.0–100.0)
MPV: 10.9 fL (ref 8.6–12.4)
PLATELETS: 234 10*3/uL (ref 150–400)
RBC: 5.08 MIL/uL (ref 4.22–5.81)
RDW: 13 % (ref 11.5–15.5)
WBC: 5.4 10*3/uL (ref 4.0–10.5)

## 2015-06-25 MED ORDER — SILDENAFIL CITRATE 100 MG PO TABS: ORAL_TABLET | ORAL | Status: DC

## 2015-06-25 NOTE — Progress Notes (Signed)
Subjective:   HPI  Mitchell Hughes is a 50 y.o. male who presents for a complete physical.  Concerns: ED - worse in last few months, but has had some difficulties for years getting and keeping erections.   Has used Cialis and Viagra both in the past with good results per Dr. Redmond [employer] but can't afford Viagra  Right hand having some 3-4 wk hx/o tingling and numbness intermittent of right 2-5th fingers.denies neck pain, upper arm pain, no other paresthesias.  No recent strenuous activity, no injury, no vision changes, no hearing changes.     Reviewed their medical, surgical, family, social, medication, and allergy history and updated chart as appropriate.  Past Medical History  Diagnosis Date  . LOW BACK PAIN, ACUTE 09/26/2009  . TOBACCO USE, QUIT 09/26/2009  . Wears glasses   . Family history of colon cancer     sister in 25s  . Family history of heart disease 2011    baseline cardiac eval 2011 with Dr. Sallyanne Kuster  . Erectile dysfunction   . History of exercise stress test     04/2014 Dr. Wynonia Lawman, prior 2011 normal Bruce treadmill stress test, Dr. Debara Pickett  . RENAL CALCULUS, RIGHT 09/28/2009    9.80mm on CT  . H/O echocardiogram 2015    Dr. Wynonia Lawman    Past Surgical History  Procedure Laterality Date  . Foot surgery      bone spur, right; Dr. Milinda Pointer  . Wisdom tooth extraction      Social History   Social History  . Marital Status: Single    Spouse Name: N/A  . Number of Children: N/A  . Years of Education: N/A   Occupational History  . Not on file.   Social History Main Topics  . Smoking status: Former Smoker -- 0.25 packs/day for 30 years    Quit date: 03/05/2009  . 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 Alcohol Use: 2.4 oz/week    4 Cans of beer per week  . Drug Use: No  . Sexual Activity: Not on file   Other Topics Concern  . Not on file   Social History Narrative   Single, 2 children, works in a warehouse,  exercise - treadmill, some weights, walking    Family History  Problem Relation Age of Onset  . Heart disease Other     parent, other relative  . Colon cancer Other   . Thyroid disease Mother   . Other Mother     brain disease, possibly dementia?  . Gallstones Mother   . Other Father     spinal cord injury/accident  . Heart disease Father 24  . Heart disease Sister 65    artificial valve  . Heart disease Brother 8    MI  . Diabetes Brother   . Stroke Neg Hx   . Hypertension Neg Hx   . Cancer Sister 9    colon  . Heart disease Sister   . Heart disease Brother 18    pacemaker     Current outpatient prescriptions:  .  Ibuprofen (ADVIL PO), Take by mouth. Reported on 06/25/2015, Disp: , Rfl:  .  sildenafil (VIAGRA) 100 MG tablet, 1/2-1 tablet po prn (Patient not taking: Reported on 06/25/2015), Disp: 10 tablet, Rfl: 2  No Known Allergies     Review of Systems Constitutional: -fever, -chills, -sweats, -unexpected weight change, -decreased appetite, -fatigue Allergy: -sneezing, -itching, -congestion Dermatology: -changing moles, --  rash, -lumps ENT: -runny nose, -ear pain, -sore throat, -hoarseness, -sinus pain, -teeth pain, - ringing in ears, -hearing loss, -nosebleeds Cardiology: -chest pain, -palpitations, -swelling, -difficulty breathing when lying flat, -waking up short of breath Respiratory: -cough, -shortness of breath, -difficulty breathing with exercise or exertion, -wheezing, -coughing up blood Gastroenterology: -abdominal pain, -nausea, -vomiting, -diarrhea, -constipation, -blood in stool, -changes in bowel movement, -difficulty swallowing or eating Hematology: -bleeding, -bruising  Musculoskeletal: -joint aches, -muscle aches, -joint swelling, -back pain, -neck pain, -cramping, -changes in gait Ophthalmology: denies vision changes, eye redness, itching, discharge Urology: -burning with urination, -difficulty urinating, -blood in urine, -urinary frequency,  -urgency, -incontinence Neurology: -headache, -weakness, -tingling, -numbness, -memory loss, -falls, -dizziness Psychology: -depressed mood, -agitation, -sleep problems     Objective:   Physical Exam  BP 100/78 mmHg  Pulse 72  Ht 6\' 3"  (1.905 m)  Wt 227 lb (102.967 kg)  BMI 28.37 kg/m2  General appearance: alert, no distress, WD/WN Skin: right mid back with 46mm x 32mm brown somewhat irregular but fairly round flat macule,similar 71mm round well demarcated lesion flat brown of right buttock, right upper back pink 75mm raised round lesion with 2 hairs protruding out, other scattered benign macules, tattoo of mermaid and dolphin on left upper chest, few scattered macules, no worrisome lesions, small scar from right upper anterior thigh from prior cyst excision HEENT: normocephalic, conjunctiva/corneas normal, sclerae anicteric, PERRLA, EOMi, nares patent, no discharge or erythema, pharynx normal Oral cavity: MMM, tongue normal, teeth in good repair Neck: supple, no lymphadenopathy, no thyromegaly, no masses, normal ROM, no bruits Chest: non tender, normal shape and expansion Heart: RRR, normal S1, S2, no murmurs Lungs: CTA bilaterally, no wheezes, rhonchi, or rales Abdomen: +bs, soft, non tender, non distended, no masses, no hepatomegaly, no splenomegaly, no bruits Back: non tender, normal ROM, no scoliosis Musculoskeletal: +right upper thigh 2cm scar from prior wound, otherwise upper extremities non tender, no obvious deformity, normal ROM throughout, lower extremities non tender, no obvious deformity, normal ROM throughout Extremities: no edema, no cyanosis, no clubbing Pulses: 2+ symmetric, upper and lower extremities, normal cap refill Neurological: alert, oriented x 3, CN2-12 intact, strength normal upper extremities and lower extremities, sensation normal throughout, DTRs 2+ throughout, no cerebellar signs, gait normal Psychiatric: normal affect, behavior normal, pleasant  GU: normal  male external genitalia,uncircumcised, nontender, no masses, +small to medium right reducible inguinal hernia, no lymphadenopathy Rectal: anus normal, normal tone, prostate WNL   Assessment and Plan :    Encounter Diagnoses  Name Primary?  . Encounter for health maintenance examination Yes  . Erectile dysfunction due to arterial insufficiency   . Family history of heart disease   . Family history of colon cancer   . Right hand paresthesia   . Screening for prostate cancer   . Special screening for malignant neoplasms, colon     Physical exam - discussed healthy lifestyle, diet, exercise, preventative care, vaccinations, and addressed their concerns.   See your eye doctor yearly for routine vision care. See your dentist yearly for routine dental care including hygiene visits twice yearly. Refer for colonoscopy Routine labs today  Hand paresthesia - he has wrist splint at home.   Used daily QHS wrist splint, Aleve OTC daily and f/u 4- 6wk.     Erectile Dysfunction - Reviewed pathophysiology and differential diagnosis of erectile dysfunction with the patient.  Discussed treatment options.  C/t Viagra.  Discussed potential risks of medications including hypotension and priapism.  Discussed proper use of medication.  Questions were answered.  Recheck with call back.  Follow-up pending labs

## 2015-06-26 LAB — LIPID PANEL
CHOLESTEROL: 155 mg/dL (ref 125–200)
HDL: 41 mg/dL (ref >=40)
LDL Cholesterol: 95 mg/dL (ref <130)
Total CHOL/HDL Ratio: 3.8 Ratio (ref <=5.0)
Triglycerides: 94 mg/dL (ref <150)
VLDL: 19 mg/dL (ref <30)

## 2015-06-26 LAB — COMPREHENSIVE METABOLIC PANEL
ALBUMIN: 4.4 g/dL (ref 3.6–5.1)
ALT: 36 U/L (ref 9–46)
AST: 28 U/L (ref 10–35)
Alkaline Phosphatase: 58 U/L (ref 40–115)
BUN: 14 mg/dL (ref 7–25)
CALCIUM: 9.3 mg/dL (ref 8.6–10.3)
CHLORIDE: 101 mmol/L (ref 98–110)
CO2: 25 mmol/L (ref 20–31)
Creat: 0.92 mg/dL (ref 0.70–1.33)
Glucose, Bld: 99 mg/dL (ref 65–99)
POTASSIUM: 4 mmol/L (ref 3.5–5.3)
Sodium: 137 mmol/L (ref 135–146)
Total Bilirubin: 0.7 mg/dL (ref 0.2–1.2)
Total Protein: 7.1 g/dL (ref 6.1–8.1)

## 2015-06-26 LAB — PSA: PSA: 0.52 ng/mL (ref <=4.00)

## 2015-07-16 ENCOUNTER — Ambulatory Visit: Payer: BLUE CROSS/BLUE SHIELD | Admitting: Medical

## 2015-07-16 ENCOUNTER — Telehealth: Payer: Self-pay

## 2015-07-16 NOTE — Telephone Encounter (Signed)
This patient no showed for their appointment today.Which of the following is necessary for this patient.   A) No follow-up necessary   B) Follow-up urgent. Locate Patient Immediately.   C) Follow-up necessary. Contact patient and Schedule visit in ____ Days.   D) Follow-up Advised. Contact patient and Schedule visit in ____ Days. 

## 2015-07-16 NOTE — Telephone Encounter (Signed)
D, please call and check on his symptoms.

## 2015-07-17 NOTE — Telephone Encounter (Signed)
No voicemail set up 

## 2015-07-17 NOTE — Telephone Encounter (Signed)
VM not set up.

## 2015-07-17 NOTE — Telephone Encounter (Signed)
To courtney for call

## 2015-07-20 ENCOUNTER — Encounter: Payer: Self-pay | Admitting: Medical

## 2015-07-20 NOTE — Telephone Encounter (Signed)
VM not set up.

## 2015-07-23 NOTE — Telephone Encounter (Signed)
Have not been able to reach pt.

## 2015-08-23 ENCOUNTER — Encounter: Payer: Self-pay | Admitting: Gastroenterology

## 2016-01-03 ENCOUNTER — Encounter: Payer: Self-pay | Admitting: Student

## 2016-05-06 ENCOUNTER — Ambulatory Visit (INDEPENDENT_AMBULATORY_CARE_PROVIDER_SITE_OTHER): Payer: BLUE CROSS/BLUE SHIELD | Admitting: Medical

## 2016-05-06 ENCOUNTER — Encounter: Payer: Self-pay | Admitting: Medical

## 2016-05-06 VITALS — BP 124/70 | HR 71 | Temp 98.1°F | Wt 240.6 lb

## 2016-05-06 DIAGNOSIS — H9202 Otalgia, left ear: Secondary | ICD-10-CM

## 2016-05-06 DIAGNOSIS — H6502 Acute serous otitis media, left ear: Secondary | ICD-10-CM | POA: Diagnosis not present

## 2016-05-06 MED ORDER — AMOXICILLIN-POT CLAVULANATE 875-125 MG PO TABS: 1.0000 | ORAL_TABLET | Freq: Two times a day (BID) | ORAL | 0 refills | Status: DC

## 2016-05-06 NOTE — Progress Notes (Signed)
Subjective: Chief Complaint  Patient presents with  . Ear Pain    left ear ache, lighthead at time x 1 week    Here for some pain in left ear for a week.  Yesterday felt a little off balance and dizzy due to the ear pressure and pain.   No ear drainage.  Some sinus congestion, but no nasal congestion, no sore throat, no fever, no cough.   No sick contacts.    Using nothing for symptoms.   No recent scuba or diving, but 3 months ago was on a plane flight.    No other aggravating or relieving factors. No other complaint.  Past Medical History:  Diagnosis Date  . Erectile dysfunction   . Family history of colon cancer    sister in 82s  . Family history of heart disease 2011   baseline cardiac eval 2011 with Dr. Sallyanne Kuster  . H/O echocardiogram 2015   Dr. Wynonia Lawman  . History of exercise stress test    04/2014 Dr. Wynonia Lawman, prior 2011 normal Bruce treadmill stress test, Dr. Debara Pickett  . LOW BACK PAIN, ACUTE 09/26/2009  . RENAL CALCULUS, RIGHT 09/28/2009   9.78mm on CT  . TOBACCO USE, QUIT 09/26/2009  . Wears glasses    Current Outpatient Prescriptions on File Prior to Visit  Medication Sig Dispense Refill  . Ibuprofen (ADVIL PO) Take by mouth. Reported on 06/25/2015    . sildenafil (VIAGRA) 100 MG tablet 1/2-1 tablet po prn 10 tablet 2   No current facility-administered medications on file prior to visit.    ROS as in subjective   Objective: BP 124/70   Pulse 71   Temp 98.1 F (36.7 C)   Wt 240 lb 9.6 oz (109.1 kg)   SpO2 97%   BMI 30.07 kg/m   General appearance: Alert, WD/WN, no distress                             Skin: warm, no rash, no diaphoresis                           Head: no sinus tenderness                            Eyes: conjunctiva normal, corneas clear, PERRLA                            Ears: mild serous effusion left ear, slight erythema, but no bulging or frank erythema, external ear canals normal, tender mastoid area just behind left ear mildly, no TMJ tenderness, no  swelling, no fluctuance or warmth                          Nose: septum midline, turbinates swollen, with no erythema or discharge             Mouth/throat: MMM, tongue normal, mild pharyngeal erythema                           Neck: supple, no adenopathy, no thyromegaly, non tender                         Lungs: clear, no rhonchi, no wheezes, no rales  Assessment: Encounter Diagnoses  Name Primary?  . Ear pain, left Yes  . Acute serous otitis media of left ear, recurrence not specified     Plan: Begin sudafed, good hydration, rest, OTC ibuprofen.   If any worse symptoms, particular fever, worse post auricular pain , then begin Augmentin.  No obvious signs of mastoiditis, but he is tender behind the ear.   Call if worse or not improving.  F/u prn.   Mitchell Hughes was seen today for ear pain.  Diagnoses and all orders for this visit:  Ear pain, left  Acute serous otitis media of left ear, recurrence not specified  Other orders -     Discontinue: amoxicillin-clavulanate (AUGMENTIN) 875-125 MG tablet; Take 1 tablet by mouth 2 (two) times daily. -     Discontinue: amoxicillin-clavulanate (AUGMENTIN) 875-125 MG tablet; Take 1 tablet by mouth 2 (two) times daily. -     amoxicillin-clavulanate (AUGMENTIN) 875-125 MG tablet; Take 1 tablet by mouth 2 (two) times daily.

## 2016-05-23 DIAGNOSIS — M19071 Primary osteoarthritis, right ankle and foot: Secondary | ICD-10-CM | POA: Diagnosis not present

## 2016-05-23 DIAGNOSIS — M76821 Posterior tibial tendinitis, right leg: Secondary | ICD-10-CM | POA: Diagnosis not present

## 2016-05-23 DIAGNOSIS — M71571 Other bursitis, not elsewhere classified, right ankle and foot: Secondary | ICD-10-CM | POA: Diagnosis not present

## 2016-05-23 DIAGNOSIS — M2170 Unequal limb length (acquired), unspecified site: Secondary | ICD-10-CM | POA: Diagnosis not present

## 2016-05-23 DIAGNOSIS — M7751 Other enthesopathy of right foot: Secondary | ICD-10-CM | POA: Diagnosis not present

## 2016-05-26 ENCOUNTER — Telehealth: Payer: Self-pay | Admitting: Medical

## 2016-05-26 NOTE — Telephone Encounter (Signed)
Please mail or call and let him know we appreciate the kind referral

## 2016-05-27 NOTE — Telephone Encounter (Signed)
Please call patient as note below

## 2016-05-27 NOTE — Telephone Encounter (Signed)
LM for pt with appreciation. Mitchell Hughes December

## 2016-05-30 DIAGNOSIS — G5761 Lesion of plantar nerve, right lower limb: Secondary | ICD-10-CM | POA: Diagnosis not present

## 2016-05-30 DIAGNOSIS — M7751 Other enthesopathy of right foot: Secondary | ICD-10-CM | POA: Diagnosis not present

## 2016-05-30 DIAGNOSIS — M76821 Posterior tibial tendinitis, right leg: Secondary | ICD-10-CM | POA: Diagnosis not present

## 2016-06-04 DIAGNOSIS — G5761 Lesion of plantar nerve, right lower limb: Secondary | ICD-10-CM | POA: Diagnosis not present

## 2016-06-04 DIAGNOSIS — M7751 Other enthesopathy of right foot: Secondary | ICD-10-CM | POA: Diagnosis not present

## 2016-08-13 DIAGNOSIS — M21969 Unspecified acquired deformity of unspecified lower leg: Secondary | ICD-10-CM | POA: Diagnosis not present

## 2016-08-13 DIAGNOSIS — M24573 Contracture, unspecified ankle: Secondary | ICD-10-CM | POA: Diagnosis not present

## 2016-08-13 DIAGNOSIS — M21079 Valgus deformity, not elsewhere classified, unspecified ankle: Secondary | ICD-10-CM | POA: Diagnosis not present

## 2016-08-13 DIAGNOSIS — M79671 Pain in right foot: Secondary | ICD-10-CM | POA: Diagnosis not present

## 2016-09-10 DIAGNOSIS — M21969 Unspecified acquired deformity of unspecified lower leg: Secondary | ICD-10-CM | POA: Diagnosis not present

## 2016-09-10 DIAGNOSIS — M79671 Pain in right foot: Secondary | ICD-10-CM | POA: Diagnosis not present

## 2016-09-10 DIAGNOSIS — M21079 Valgus deformity, not elsewhere classified, unspecified ankle: Secondary | ICD-10-CM | POA: Diagnosis not present

## 2016-09-10 DIAGNOSIS — M24573 Contracture, unspecified ankle: Secondary | ICD-10-CM | POA: Diagnosis not present

## 2016-09-26 DIAGNOSIS — Z87891 Personal history of nicotine dependence: Secondary | ICD-10-CM | POA: Diagnosis not present

## 2016-09-26 DIAGNOSIS — M216X1 Other acquired deformities of right foot: Secondary | ICD-10-CM | POA: Diagnosis not present

## 2016-09-26 DIAGNOSIS — M79671 Pain in right foot: Secondary | ICD-10-CM | POA: Diagnosis not present

## 2016-10-23 DIAGNOSIS — M24573 Contracture, unspecified ankle: Secondary | ICD-10-CM | POA: Diagnosis not present

## 2016-10-23 DIAGNOSIS — Z4789 Encounter for other orthopedic aftercare: Secondary | ICD-10-CM | POA: Diagnosis not present

## 2016-10-23 DIAGNOSIS — M21079 Valgus deformity, not elsewhere classified, unspecified ankle: Secondary | ICD-10-CM | POA: Diagnosis not present

## 2016-11-17 DIAGNOSIS — Z4789 Encounter for other orthopedic aftercare: Secondary | ICD-10-CM | POA: Diagnosis not present

## 2016-12-18 DIAGNOSIS — Z4789 Encounter for other orthopedic aftercare: Secondary | ICD-10-CM | POA: Diagnosis not present

## 2017-01-07 DIAGNOSIS — L97511 Non-pressure chronic ulcer of other part of right foot limited to breakdown of skin: Secondary | ICD-10-CM | POA: Diagnosis not present

## 2017-01-07 DIAGNOSIS — L03115 Cellulitis of right lower limb: Secondary | ICD-10-CM | POA: Diagnosis not present

## 2017-01-07 DIAGNOSIS — Z4789 Encounter for other orthopedic aftercare: Secondary | ICD-10-CM | POA: Diagnosis not present

## 2017-01-07 DIAGNOSIS — M7989 Other specified soft tissue disorders: Secondary | ICD-10-CM | POA: Diagnosis not present

## 2017-01-13 DIAGNOSIS — Z4789 Encounter for other orthopedic aftercare: Secondary | ICD-10-CM | POA: Diagnosis not present

## 2017-06-04 ENCOUNTER — Encounter: Payer: Self-pay | Admitting: Gastroenterology

## 2017-06-04 ENCOUNTER — Ambulatory Visit (INDEPENDENT_AMBULATORY_CARE_PROVIDER_SITE_OTHER): Payer: BLUE CROSS/BLUE SHIELD | Admitting: Medical

## 2017-06-04 ENCOUNTER — Encounter: Payer: Self-pay | Admitting: Medical

## 2017-06-04 VITALS — BP 120/82 | HR 76 | Temp 98.2°F | Wt 249.6 lb

## 2017-06-04 DIAGNOSIS — R059 Cough, unspecified: Secondary | ICD-10-CM

## 2017-06-04 DIAGNOSIS — R05 Cough: Secondary | ICD-10-CM

## 2017-06-04 DIAGNOSIS — Z1211 Encounter for screening for malignant neoplasm of colon: Secondary | ICD-10-CM

## 2017-06-04 DIAGNOSIS — J0110 Acute frontal sinusitis, unspecified: Secondary | ICD-10-CM

## 2017-06-04 DIAGNOSIS — Z800 Family history of malignant neoplasm of digestive organs: Secondary | ICD-10-CM | POA: Diagnosis not present

## 2017-06-04 MED ORDER — PROMETHAZINE-DM 6.25-15 MG/5ML PO SYRP: 5.0000 mL | ORAL_SOLUTION | Freq: Four times a day (QID) | ORAL | 0 refills | Status: DC | PRN

## 2017-06-04 MED ORDER — PREDNISONE 20 MG PO TABS: ORAL_TABLET | ORAL | 0 refills | Status: DC

## 2017-06-04 MED ORDER — AMOXICILLIN 500 MG PO TABS: ORAL_TABLET | ORAL | 0 refills | Status: DC

## 2017-06-04 NOTE — Progress Notes (Signed)
Subjective: Chief Complaint  Patient presents with  . coughing    coughing, sinus pressure, chest is sore from coughing , no fever  x1 week   Here for about 9 day hx/o illness. Started 9 days ago with sinus pressure, cough, chest soreness from cough.  No sore throat, no headaches.  No fever.  No NVD.  Been around lots of sick contacts.    Using alka seltzer cold and flu, mucinex, theraflu.  No other aggravating or relieving factors. No other complaint.  Past Medical History:  Diagnosis Date  . Erectile dysfunction   . Family history of colon cancer    sister in 4s  . Family history of heart disease 2011   baseline cardiac eval 2011 with Dr. Sallyanne Kuster  . H/O echocardiogram 2015   Dr. Wynonia Lawman  . History of exercise stress test    04/2014 Dr. Wynonia Lawman, prior 2011 normal Bruce treadmill stress test, Dr. Debara Pickett  . LOW BACK PAIN, ACUTE 09/26/2009  . RENAL CALCULUS, RIGHT 09/28/2009   9.57mm on CT  . TOBACCO USE, QUIT 09/26/2009  . Wears glasses    Past Surgical History:  Procedure Laterality Date  . FOOT SURGERY     bone spur, right; Dr. Milinda Pointer  . WISDOM TOOTH EXTRACTION     ROS as in subjective   Objective: BP 120/82   Pulse 76   Temp 98.2 F (36.8 C)   Wt 249 lb 9.6 oz (113.2 kg)   SpO2 97%   BMI 31.20 kg/m   General appearance: Alert, WD/WN, no distress                             Skin: warm, no rash                           Head: +frontal sinus tenderness,                            Eyes: conjunctiva normal, corneas clear, PERRLA                            Ears: pearly TMs, external ear canals normal                          Nose: septum midline, turbinates swollen, with erythema and clear discharge             Mouth/throat: MMM, tongue normal, mild pharyngeal erythema                           Neck: supple, no adenopathy, no thyromegaly, non tender                          Heart: RRR, normal S1, S2, no murmurs                         Lungs: bronchial breath sounds,  +rhonchi, no wheezes, rales     Assessment: Encounter Diagnoses  Name Primary?  . Acute non-recurrent frontal sinusitis Yes  . Cough   . Special screening for malignant neoplasms, colon   . Family history of colon cancer     Plan: Sinusitis - rest, hydrate well, medications below, f/u prn  Referral for colonoscopy  Return soon for CPX  [place] was seen today for coughing.  Diagnoses and all orders for this visit:  Acute non-recurrent frontal sinusitis  Cough  Special screening for malignant neoplasms, colon -     Ambulatory referral to Gastroenterology  Family history of colon cancer -     Ambulatory referral to Gastroenterology  Other orders -     amoxicillin (AMOXIL) 500 MG tablet; 2 tablets po BID x 10 days -     promethazine-dextromethorphan (PROMETHAZINE-DM) 6.25-15 MG/5ML syrup; Take 5 mLs by mouth 4 (four) times daily as needed for cough. -     predniSONE (DELTASONE) 20 MG tablet; 3 tablets today, 2 tablets tomorrow, 1 tablet the third day   F/u soon for physical

## 2017-06-22 ENCOUNTER — Ambulatory Visit (INDEPENDENT_AMBULATORY_CARE_PROVIDER_SITE_OTHER): Payer: BLUE CROSS/BLUE SHIELD | Admitting: Medical

## 2017-06-22 ENCOUNTER — Encounter: Payer: Self-pay | Admitting: Medical

## 2017-06-22 VITALS — BP 132/80 | HR 77 | Ht 75.0 in | Wt 246.2 lb

## 2017-06-22 DIAGNOSIS — Z125 Encounter for screening for malignant neoplasm of prostate: Secondary | ICD-10-CM

## 2017-06-22 DIAGNOSIS — Z0000 Encounter for general adult medical examination without abnormal findings: Secondary | ICD-10-CM

## 2017-06-22 DIAGNOSIS — N5201 Erectile dysfunction due to arterial insufficiency: Secondary | ICD-10-CM

## 2017-06-22 DIAGNOSIS — K469 Unspecified abdominal hernia without obstruction or gangrene: Secondary | ICD-10-CM

## 2017-06-22 DIAGNOSIS — Z7189 Other specified counseling: Secondary | ICD-10-CM | POA: Insufficient documentation

## 2017-06-22 DIAGNOSIS — Z7185 Encounter for immunization safety counseling: Secondary | ICD-10-CM | POA: Insufficient documentation

## 2017-06-22 DIAGNOSIS — Z8249 Family history of ischemic heart disease and other diseases of the circulatory system: Secondary | ICD-10-CM

## 2017-06-22 DIAGNOSIS — Z122 Encounter for screening for malignant neoplasm of respiratory organs: Secondary | ICD-10-CM

## 2017-06-22 DIAGNOSIS — Z800 Family history of malignant neoplasm of digestive organs: Secondary | ICD-10-CM | POA: Diagnosis not present

## 2017-06-22 DIAGNOSIS — Z1211 Encounter for screening for malignant neoplasm of colon: Secondary | ICD-10-CM | POA: Diagnosis not present

## 2017-06-22 LAB — POCT URINALYSIS DIP (PROADVANTAGE DEVICE)
Bilirubin, UA: NEGATIVE
Blood, UA: NEGATIVE
Glucose, UA: NEGATIVE mg/dL
Ketones, POC UA: NEGATIVE mg/dL
LEUKOCYTES UA: NEGATIVE
NITRITE UA: NEGATIVE
PH UA: 6 (ref 5.0–8.0)
PROTEIN UA: NEGATIVE mg/dL
Specific Gravity, Urine: 1.03
UUROB: NEGATIVE

## 2017-06-22 NOTE — Patient Instructions (Signed)
  Thank you for giving me the opportunity to serve you today.    Your diagnosis today includes: Encounter Diagnoses  Name Primary?  . Routine general medical examination at a health care facility Yes  . Family history of colon cancer   . Family history of heart disease   . Hernia of abdominal cavity   . Screening for prostate cancer   . Special screening for malignant neoplasms, colon   . Erectile dysfunction due to arterial insufficiency   . Vaccine counseling   . Encounter for screening for lung cancer     Recommendations:  Eat a healthy low fat diet  Exercise most days per week, at least 30 minutes or more  See your eye doctor yearly for routine vision care.  See your dentist yearly for routine dental care including hygiene visits twice yearly.  I recommend you have a Shingles Vaccine to help prevent shingles or herpes zoster outbreak.   Please call your insurer to inquire about coverage for the Shingrix vaccine given in 2 doses.   Some insurers cover this vaccine after age 54, some cover this after age 28.  If your insurer covers this, then call to schedule appointment to have this vaccine here.  I recommend a yearly Influenza/Flu vaccine, typically in September to help reduce the risk of you and others getting the flu illness  Call insurance above coverage for Chest CT lung cancer screen  We will call with lab results  See me yearly for a physical

## 2017-06-22 NOTE — Progress Notes (Signed)
Subjective:   HPI  Mitchell Hughes is a 52 y.o. male who presents for physical Chief Complaint  Patient presents with  . Annual Exam    physical ,no other concerns     Medical care team includes: Tysinger, Leward Quan here for primary care Dentist Eye doctor  Has colonoscopy scheduled 08/2017.  We reviewed 2011 CT scan of abdomen showing possible artery calcifications  He has concerns about ED.  Viagra worked ok in the past but was too expensive   Reviewed their medical, surgical, family, social, medication, and allergy history and updated chart as appropriate.  Past Medical History:  Diagnosis Date  . Erectile dysfunction   . Family history of colon cancer    sister in 23s  . Family history of heart disease 2011   baseline cardiac eval 2011 with Dr. Sallyanne Kuster  . H/O echocardiogram 2015   Dr. Wynonia Lawman  . History of exercise stress test    04/2014 Dr. Wynonia Lawman, prior 2011 normal Bruce treadmill stress test, Dr. Debara Pickett  . LOW BACK PAIN, ACUTE 09/26/2009  . RENAL CALCULUS, RIGHT 09/28/2009   9.35mm on CT  . TOBACCO USE, QUIT 09/26/2009  . Wears glasses     Past Surgical History:  Procedure Laterality Date  . FOOT SURGERY     bone spur, right; Dr. Milinda Pointer  . WISDOM TOOTH EXTRACTION      Social History   Socioeconomic History  . Marital status: Single    Spouse name: Not on file  . Number of children: Not on file  . Years of education: Not on file  . Highest education level: Not on file  Social Needs  . Financial resource strain: Not on file  . Food insecurity - worry: Not on file  . Food insecurity - inability: Not on file  . Transportation needs - medical: Not on file  . Transportation needs - non-medical: Not on file  Occupational History  . Not on file  Tobacco Use  . Smoking status: Former Smoker    Packs/day: 0.25    Years: 30.00    Pack years: 7.50    Last attempt to quit: 03/05/2009    Years since quitting: 8.3  . Smokeless tobacco: Never Used  . 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 Alcohol use: Yes    Alcohol/week: 2.4 oz    Types: 4 Cans of beer per week    Comment: occ  . Drug use: No  . Sexual activity: Not on file  Other Topics Concern  . Not on file  Social History Narrative   Single, 2 children, sister staying with him currently, works in a warehouse, exercise - treadmill, some weights, walking.  06/2017.    Family History  Problem Relation Age of Onset  . Thyroid disease Mother   . Other Mother        brain disease, possibly dementia?  . Gallstones Mother   . Other Father        spinal cord injury/accident  . Heart disease Father 107  . Heart disease Sister 1       artificial valve  . Heart disease Brother 39       MI  . Diabetes Brother   . Cancer Sister 3       colon  . Cancer Sister        pancreas  . Heart disease Sister   . Heart disease Brother 75  pacemaker  . Cancer Brother        pancreas  . Heart disease Other        parent, other relative  . Colon cancer Other   . Stroke Neg Hx   . Hypertension Neg Hx     No current outpatient medications on file.  No Known Allergies    Review of Systems Constitutional: -fever, -chills, -sweats, -unexpected weight change, -decreased appetite, -fatigue Allergy: -sneezing, -itching, -congestion Dermatology: -changing moles, --rash, -lumps ENT: -runny nose, -ear pain, -sore throat, -hoarseness, -sinus pain, -teeth pain, - ringing in ears, -hearing loss, -nosebleeds Cardiology: -chest pain, -palpitations, -swelling, -difficulty breathing when lying flat, -waking up short of breath Respiratory: -cough, -shortness of breath, -difficulty breathing with exercise or exertion, -wheezing, -coughing up blood Gastroenterology: -abdominal pain, -nausea, -vomiting, -diarrhea, -constipation, -blood in stool, -changes in bowel movement, -difficulty swallowing or eating Hematology: -bleeding,  -bruising  Musculoskeletal: -joint aches, -muscle aches, -joint swelling, -back pain, -neck pain, -cramping, -changes in gait Ophthalmology: denies vision changes, eye redness, itching, discharge Urology: -burning with urination, -difficulty urinating, -blood in urine, -urinary frequency, -urgency, -incontinence Neurology: -headache, -weakness, -tingling, -numbness, -memory loss, -falls, -dizziness Psychology: -depressed mood, -agitation, -sleep problems     Objective:   BP 132/80   Pulse 77   Ht 6\' 3"  (1.905 m)   Wt 246 lb 3.2 oz (111.7 kg)   SpO2 97%   BMI 30.77 kg/m   General appearance: alert, no distress, WD/WN, African American male Skin: no worrisome lesions HEENT: normocephalic, conjunctiva/corneas normal, sclerae anicteric, PERRLA, EOMi, nares patent, no discharge or erythema, pharynx normal Oral cavity: MMM, tongue normal, teeth normal Neck: supple, no lymphadenopathy, no thyromegaly, no masses, normal ROM, no bruits Chest: non tender, normal shape and expansion Heart: RRR, normal S1, S2, no murmurs Lungs: CTA bilaterally, no wheezes, rhonchi, or rales Abdomen: +bs, soft, non tender, non distended, no masses, no hepatomegaly, no splenomegaly, no bruits Back: non tender, normal ROM, no scoliosis Musculoskeletal: upper extremities non tender, no obvious deformity, normal ROM throughout, lower extremities non tender, no obvious deformity, normal ROM throughout Extremities: no edema, no cyanosis, no clubbing Pulses: 2+ symmetric, upper and lower extremities, normal cap refill Neurological: alert, oriented x 3, CN2-12 intact, strength normal upper extremities and lower extremities, sensation normal throughout, DTRs 2+ throughout, no cerebellar signs, gait normal Psychiatric: normal affect, behavior normal, pleasant  GU: normal male external genitalia,uncircumcised, nontender, no masses, reducible mild right inguinal direct hernia, no lymphadenopathy Rectal: anus normal tone,  prostate WNL   Adult ECG Report  Indication: physical  Rate: 70 bpm  Rhythm: normal sinus rhythm  QRS Axis: -32 degrees  PR Interval: 147ms  QRS Duration: 12ms  QTc: 460ms  Conduction Disturbances: none  Other Abnormalities: left axis deviation  Patient's cardiac risk factors are: none.  EKG comparison: none  Narrative Interpretation: left axis deviation    Assessment and Plan :    Encounter Diagnoses  Name Primary?  . Routine general medical examination at a health care facility Yes  . Family history of colon cancer   . Family history of heart disease   . Hernia of abdominal cavity   . Screening for prostate cancer   . Special screening for malignant neoplasms, colon   . Erectile dysfunction due to arterial insufficiency   . Vaccine counseling   . Encounter for screening for lung cancer     Physical exam - discussed and counseled on healthy lifestyle, diet, exercise, preventative care, vaccinations, sick and  well care, proper use of emergency dept and after hours care, and addressed their concerns.    Health screening: See your eye doctor yearly for routine vision care. See your dentist yearly for routine dental care including hygiene visits twice yearly.  Cancer screening Discussed colonoscopy screening and he is scheduled for first colonoscopy in [place] Discussed PSA, prostate exam, and prostate cancer screening risks/benefits.   Discussed prostate symptoms as well.  Prostate screening performed: Yes  Vaccinations: Counseled on the following vaccines:  Shingrix, he will consider Yearly flu shot  Separate significant chronic issues discussed: ED - discussed labs today.   Will consider repeat trial of Viagra or similar.    TST level today Hernia - mild, watch and wait approach discussed  Ronen was seen today for annual exam.  Diagnoses and all orders for this visit:  Routine general medical examination at a health care facility -     POCT Urinalysis DIP  (Proadvantage Device) -     EKG 12-Lead -     Testosterone -     Comprehensive metabolic panel -     CBC with Differential/Platelet -     Lipid panel -     PSA -     TSH -     Hemoglobin A1c  Family history of colon cancer  Family history of heart disease -     EKG 12-Lead  Hernia of abdominal cavity  Screening for prostate cancer -     PSA  Special screening for malignant neoplasms, colon  Erectile dysfunction due to arterial insufficiency -     Testosterone  Vaccine counseling  Encounter for screening for lung cancer   Follow-up pending labs, yearly for physical

## 2017-06-23 ENCOUNTER — Other Ambulatory Visit: Payer: Self-pay | Admitting: Medical

## 2017-06-23 LAB — CBC WITH DIFFERENTIAL/PLATELET
Basophils Absolute: 0 10*3/uL (ref 0.0–0.2)
Basos: 0 %
EOS (ABSOLUTE): 0.2 10*3/uL (ref 0.0–0.4)
EOS: 3 %
HEMOGLOBIN: 16.2 g/dL (ref 13.0–17.7)
Hematocrit: 46.1 % (ref 37.5–51.0)
Immature Grans (Abs): 0 10*3/uL (ref 0.0–0.1)
Immature Granulocytes: 0 %
LYMPHS: 22 %
Lymphocytes Absolute: 1.5 10*3/uL (ref 0.7–3.1)
MCH: 30.3 pg (ref 26.6–33.0)
MCHC: 35.1 g/dL (ref 31.5–35.7)
MCV: 86 fL (ref 79–97)
MONOS ABS: 0.4 10*3/uL (ref 0.1–0.9)
Monocytes: 6 %
Neutrophils Absolute: 4.8 10*3/uL (ref 1.4–7.0)
Neutrophils: 69 %
Platelets: 242 10*3/uL (ref 150–379)
RBC: 5.34 x10E6/uL (ref 4.14–5.80)
RDW: 13.6 % (ref 12.3–15.4)
WBC: 7 10*3/uL (ref 3.4–10.8)

## 2017-06-23 LAB — COMPREHENSIVE METABOLIC PANEL
ALK PHOS: 63 IU/L (ref 39–117)
ALT: 28 IU/L (ref 0–44)
AST: 20 IU/L (ref 0–40)
Albumin/Globulin Ratio: 1.8 (ref 1.2–2.2)
Albumin: 4.3 g/dL (ref 3.5–5.5)
BUN/Creatinine Ratio: 11 (ref 9–20)
BUN: 11 mg/dL (ref 6–24)
Bilirubin Total: 0.3 mg/dL (ref 0.0–1.2)
CALCIUM: 9.2 mg/dL (ref 8.7–10.2)
CO2: 20 mmol/L (ref 20–29)
CREATININE: 1.01 mg/dL (ref 0.76–1.27)
Chloride: 106 mmol/L (ref 96–106)
GFR calc Af Amer: 98 mL/min/{1.73_m2} (ref >59)
GFR, EST NON AFRICAN AMERICAN: 85 mL/min/{1.73_m2} (ref >59)
GLOBULIN, TOTAL: 2.4 g/dL (ref 1.5–4.5)
GLUCOSE: 98 mg/dL (ref 65–99)
Potassium: 4.7 mmol/L (ref 3.5–5.2)
SODIUM: 142 mmol/L (ref 134–144)
Total Protein: 6.7 g/dL (ref 6.0–8.5)

## 2017-06-23 LAB — TSH: TSH: 1.32 u[IU]/mL (ref 0.450–4.500)

## 2017-06-23 LAB — LIPID PANEL
CHOLESTEROL TOTAL: 132 mg/dL (ref 100–199)
Chol/HDL Ratio: 3.6 ratio (ref 0.0–5.0)
HDL: 37 mg/dL — ABNORMAL LOW (ref >39)
LDL CALC: 77 mg/dL (ref 0–99)
Triglycerides: 90 mg/dL (ref 0–149)
VLDL Cholesterol Cal: 18 mg/dL (ref 5–40)

## 2017-06-23 LAB — HEMOGLOBIN A1C
Est. average glucose Bld gHb Est-mCnc: 114 mg/dL
Hgb A1c MFr Bld: 5.6 % (ref 4.8–5.6)

## 2017-06-23 LAB — PSA: PROSTATE SPECIFIC AG, SERUM: 0.5 ng/mL (ref 0.0–4.0)

## 2017-06-23 LAB — TESTOSTERONE: TESTOSTERONE: 648 ng/dL (ref 264–916)

## 2017-06-23 MED ORDER — PRAVASTATIN SODIUM 20 MG PO TABS: 20.0000 mg | ORAL_TABLET | Freq: Every evening | ORAL | 0 refills | Status: DC

## 2017-06-23 MED ORDER — SILDENAFIL CITRATE 100 MG PO TABS: 100.0000 mg | ORAL_TABLET | ORAL | 1 refills | Status: DC | PRN

## 2017-06-24 ENCOUNTER — Other Ambulatory Visit: Payer: Self-pay | Admitting: Medical

## 2017-06-24 MED ORDER — TADALAFIL 20 MG PO TABS: 20.0000 mg | ORAL_TABLET | Freq: Every day | ORAL | 2 refills | Status: DC | PRN

## 2017-06-26 ENCOUNTER — Other Ambulatory Visit: Payer: Self-pay | Admitting: Medical

## 2017-06-26 ENCOUNTER — Telehealth: Payer: Self-pay | Admitting: Medical

## 2017-06-26 MED ORDER — SILDENAFIL CITRATE 20 MG PO TABS: ORAL_TABLET | ORAL | 2 refills | Status: DC

## 2017-06-26 NOTE — Telephone Encounter (Signed)
Generic Viagra sent per his phone call request here yesterday. Name brand medications too costly

## 2017-06-26 NOTE — Telephone Encounter (Signed)
error 

## 2017-06-26 NOTE — Telephone Encounter (Signed)
New Message   *STAT* If patient is at the pharmacy, call can be transferred to refill team.   1. Which medications need to be refilled? (please list name of each medication and dose if known)  Tadalafil 20 mg tablets once daily as needed for ED  2. Which pharmacy/location (including street and city if local pharmacy) is medication to be sent to? [facility] [phone], [address]  3. Do they need a 30 day or 90 day supply?  10 days supply

## 2017-07-19 ENCOUNTER — Telehealth: Payer: Self-pay | Admitting: Medical

## 2017-07-19 NOTE — Telephone Encounter (Signed)
P.A. SILDENAFIL  

## 2017-07-19 NOTE — Telephone Encounter (Signed)
P.A. TADALAFIL 

## 2017-07-20 ENCOUNTER — Ambulatory Visit (AMBULATORY_SURGERY_CENTER): Payer: Self-pay

## 2017-07-20 ENCOUNTER — Encounter: Payer: Self-pay | Admitting: Gastroenterology

## 2017-07-20 ENCOUNTER — Other Ambulatory Visit: Payer: Self-pay

## 2017-07-20 VITALS — Ht 77.0 in | Wt 243.2 lb

## 2017-07-20 DIAGNOSIS — Z800 Family history of malignant neoplasm of digestive organs: Secondary | ICD-10-CM

## 2017-07-20 MED ORDER — NA SULFATE-K SULFATE-MG SULF 17.5-3.13-1.6 GM/177ML PO SOLN: 1.0000 | Freq: Once | ORAL | 0 refills | Status: AC

## 2017-07-20 NOTE — Progress Notes (Signed)
No egg or soy allergy known to patient  No issues with past sedation with any surgeries  or procedures, no intubation problems  No diet pills per patient No home 02 use per patient  No blood thinners per patient  Pt denies issues with constipation  No A fib or A flutter  EMMI video sent to pt's e mail  

## 2017-07-25 NOTE — Telephone Encounter (Addendum)
P.A. Isabelle Course, only approved for BPH.  Called pt and informed, he states the Sildenafil works better than the Tadalafil and he would like to try the cash pay option of [employer] for $95 Sildenafil, is this ok to switch?

## 2017-07-25 NOTE — Telephone Encounter (Signed)
P.A. Isabelle Course, but stated coverage for 5mg  approved for up to 8 tablets

## 2017-08-03 ENCOUNTER — Encounter: Payer: Self-pay | Admitting: Gastroenterology

## 2017-08-03 ENCOUNTER — Other Ambulatory Visit: Payer: Self-pay

## 2017-08-03 ENCOUNTER — Ambulatory Visit (AMBULATORY_SURGERY_CENTER): Payer: BLUE CROSS/BLUE SHIELD | Admitting: Gastroenterology

## 2017-08-03 VITALS — BP 104/62 | HR 56 | Temp 96.9°F | Resp 10 | Ht 77.0 in | Wt 243.0 lb

## 2017-08-03 DIAGNOSIS — Z1212 Encounter for screening for malignant neoplasm of rectum: Secondary | ICD-10-CM | POA: Diagnosis not present

## 2017-08-03 DIAGNOSIS — D122 Benign neoplasm of ascending colon: Secondary | ICD-10-CM

## 2017-08-03 DIAGNOSIS — Z800 Family history of malignant neoplasm of digestive organs: Secondary | ICD-10-CM

## 2017-08-03 DIAGNOSIS — D120 Benign neoplasm of cecum: Secondary | ICD-10-CM

## 2017-08-03 DIAGNOSIS — Z1211 Encounter for screening for malignant neoplasm of colon: Secondary | ICD-10-CM | POA: Diagnosis present

## 2017-08-03 HISTORY — PX: COLONOSCOPY: SHX174

## 2017-08-03 MED ORDER — SODIUM CHLORIDE 0.9 % IV SOLN: 500.0000 mL | Freq: Once | INTRAVENOUS | Status: DC

## 2017-08-03 NOTE — Op Note (Signed)
[facility] Patient Name: Mitchell Hughes Procedure Date: 08/03/2017 8:33 AM MRN: [id number] Endoscopist: Remo Lipps P. Shasta Chinn MD, MD Age: 52 Referring MD:  Date of Birth: April 23, 1966 Gender: Male Account #: [id number] Procedure:                Colonoscopy Indications:              Screening in patient at increased risk: Family                            history of 1st-degree relative with colorectal                            cancer (sister diagnosed age 63), This is the                            patient's first colonoscopy Medicines:                Monitored Anesthesia Care Procedure:                Pre-Anesthesia Assessment:                           - Prior to the procedure, a History and Physical                            was performed, and patient medications and                            allergies were reviewed. The patient's tolerance of                            previous anesthesia was also reviewed. The risks                            and benefits of the procedure and the sedation                            options and risks were discussed with the patient.                            All questions were answered, and informed consent                            was obtained. Prior Anticoagulants: The patient has                            taken no previous anticoagulant or antiplatelet                            agents. ASA Grade Assessment: II - A patient with                            mild systemic disease. After reviewing the risks  and benefits, the patient was deemed in                            satisfactory condition to undergo the procedure.                           After obtaining informed consent, the colonoscope                            was passed under direct vision. Throughout the                            procedure, the patient's blood pressure, pulse, and                            oxygen saturations were monitored  continuously. The                            Colonoscope was introduced through the anus and                            advanced to the the cecum, identified by                            appendiceal orifice and ileocecal valve. The                            colonoscopy was performed without difficulty. The                            patient tolerated the procedure well. The quality                            of the bowel preparation was good. The ileocecal                            valve, appendiceal orifice, and rectum were                            photographed. Scope In: 8:36:50 AM Scope Out: 8:52:32 AM Scope Withdrawal Time: 0 hours 12 minutes 58 seconds  Total Procedure Duration: 0 hours 15 minutes 42 seconds  Findings:                 The perianal and digital rectal examinations were                            normal.                           Two sessile polyps were found in the cecum. The                            polyps were 3 to 6 mm in size. These polyps were  removed with a cold snare. Resection and retrieval                            were complete.                           A 3 mm polyp was found in the ascending colon. The                            polyp was sessile. The polyp was removed with a                            cold snare. Resection and retrieval were complete.                           Multiple medium-mouthed diverticula were found in                            the sigmoid colon and ascending colon.                           The exam was otherwise without abnormality on                            direct and retroflexion views. Complications:            No immediate complications. Estimated blood loss:                            Minimal. Estimated Blood Loss:     Estimated blood loss was minimal. Impression:               - Two 3 to 6 mm polyps in the cecum, removed with a                            cold snare. Resected and  retrieved.                           - One 3 mm polyp in the ascending colon, removed                            with a cold snare. Resected and retrieved.                           - Diverticulosis in the sigmoid colon and in the                            ascending colon.                           - The examination was otherwise normal on direct                            and retroflexion views. Recommendation:           -  Patient has a contact number available for                            emergencies. The signs and symptoms of potential                            delayed complications were discussed with the                            patient. Return to normal activities tomorrow.                            Written discharge instructions were provided to the                            patient.                           - Resume previous diet.                           - Continue present medications.                           - Await pathology results.                           - Repeat colonoscopy is recommended for                            surveillance. The colonoscopy date will be                            determined after pathology results from today's                            exam become available for review. Remo Lipps P. Tawnya Pujol MD, MD 08/03/2017 8:56:35 AM This report has been signed electronically.

## 2017-08-03 NOTE — Patient Instructions (Signed)
YOU HAD AN ENDOSCOPIC PROCEDURE TODAY AT THE [facility]:   Refer to the procedure report that was given to you for any specific questions about what was found during the examination.  If the procedure report does not answer your questions, please call your gastroenterologist to clarify.  If you requested that your care partner not be given the details of your procedure findings, then the procedure report has been included in a sealed envelope for you to review at your convenience later.  YOU SHOULD EXPECT: Some feelings of bloating in the abdomen. Passage of more gas than usual.  Walking can help get rid of the air that was put into your GI tract during the procedure and reduce the bloating. If you had a lower endoscopy (such as a colonoscopy or flexible sigmoidoscopy) you may notice spotting of blood in your stool or on the toilet paper. If you underwent a bowel prep for your procedure, you may not have a normal bowel movement for a few days.  Please Note:  You might notice some irritation and congestion in your nose or some drainage.  This is from the oxygen used during your procedure.  There is no need for concern and it should clear up in a day or so.  SYMPTOMS TO REPORT IMMEDIATELY:   Following lower endoscopy (colonoscopy or flexible sigmoidoscopy):  Excessive amounts of blood in the stool  Significant tenderness or worsening of abdominal pains  Swelling of the abdomen that is new, acute  Fever of 100F or higher   For urgent or emergent issues, a gastroenterologist can be reached at any hour by calling [phone].   DIET:  We do recommend a small meal at first, but then you may proceed to your regular diet.  Drink plenty of fluids but you should avoid alcoholic beverages for 24 hours. Try to increase the fiber in your diet, and drink plenty of water.  ACTIVITY:  You should plan to take it easy for the rest of today and you should NOT DRIVE or use heavy machinery until  tomorrow (because of the sedation medicines used during the test).    FOLLOW UP: Our staff will call the number listed on your records the next business day following your procedure to check on you and address any questions or concerns that you may have regarding the information given to you following your procedure. If we do not reach you, we will leave a message.  However, if you are feeling well and you are not experiencing any problems, there is no need to return our call.  We will assume that you have returned to your regular daily activities without incident.  If any biopsies were taken you will be contacted by phone or by letter within the next 1-3 weeks.  Please call us at [phone] if you have not heard about the biopsies in 3 weeks.    SIGNATURES/CONFIDENTIALITY: You and/or your care partner have signed paperwork which will be entered into your electronic medical record.  These signatures attest to the fact that that the information above on your After Visit Summary has been reviewed and is understood.  Full responsibility of the confidentiality of this discharge information lies with you and/or your care-partner.  Read all handouts given to you by your recovery room nurse. 

## 2017-08-03 NOTE — Progress Notes (Signed)
To PACU, VSS. Report to [email or web address] 

## 2017-08-03 NOTE — Progress Notes (Signed)
Called to room to assist during endoscopic procedure.  Patient ID and intended procedure confirmed with present staff. Received instructions for my participation in the procedure from the performing physician.  

## 2017-08-03 NOTE — Progress Notes (Signed)
Pt's states no medical or surgical changes since previsit or office visit. 

## 2017-08-04 ENCOUNTER — Telehealth: Payer: Self-pay | Admitting: *Deleted

## 2017-08-04 NOTE — Telephone Encounter (Signed)
  Follow up Call-  Call back number 08/03/2017  Post procedure Call Back phone  # [phone]  Permission to leave phone message Yes  Some recent data might be hidden     Patient questions:  Do you have a fever, pain , or abdominal swelling? No. Pain Score  0 *  Have you tolerated food without any problems? Yes.    Have you been able to return to your normal activities? Yes.    Do you have any questions about your discharge instructions: Diet   No. Medications  No. Follow up visit  No.  Do you have questions or concerns about your Care? No.  Actions: * If pain score is 4 or above: No action needed, pain <4.

## 2017-08-05 MED ORDER — SILDENAFIL CITRATE 20 MG PO TABS: ORAL_TABLET | ORAL | 0 refills | Status: DC

## 2017-08-05 NOTE — Telephone Encounter (Signed)
That is fine 

## 2017-08-05 NOTE — Telephone Encounter (Signed)
Sildenafil sent into new pharmacy

## 2017-08-05 NOTE — Telephone Encounter (Signed)
P.A. Approved for #8 for 30 days.  Called pharmacy and went thru for $20.  Called pt and informed, he states Sildenafil works better but insurance doesn't pay for this but pt would like to try the cash pay option at [employer]

## 2017-08-11 ENCOUNTER — Encounter: Payer: Self-pay | Admitting: Gastroenterology

## 2017-09-14 ENCOUNTER — Ambulatory Visit: Payer: Self-pay | Admitting: Medical

## 2017-10-21 ENCOUNTER — Other Ambulatory Visit: Payer: Self-pay | Admitting: Medical

## 2018-05-13 ENCOUNTER — Ambulatory Visit: Payer: BLUE CROSS/BLUE SHIELD | Admitting: Medical

## 2018-05-13 ENCOUNTER — Encounter: Payer: Self-pay | Admitting: Medical

## 2018-05-13 ENCOUNTER — Ambulatory Visit
Admission: RE | Admit: 2018-05-13 | Discharge: 2018-05-13 | Disposition: A | Discharge status: Home / Self Care | Payer: BLUE CROSS/BLUE SHIELD | Source: Ambulatory Visit | Attending: Medical | Admitting: Medical

## 2018-05-13 VITALS — BP 110/70 | HR 72 | Temp 97.6°F | Resp 16 | Ht 77.0 in | Wt 255.0 lb

## 2018-05-13 DIAGNOSIS — G8929 Other chronic pain: Secondary | ICD-10-CM

## 2018-05-13 DIAGNOSIS — M1812 Unilateral primary osteoarthritis of first carpometacarpal joint, left hand: Secondary | ICD-10-CM | POA: Diagnosis not present

## 2018-05-13 DIAGNOSIS — R21 Rash and other nonspecific skin eruption: Secondary | ICD-10-CM | POA: Diagnosis not present

## 2018-05-13 DIAGNOSIS — M79645 Pain in left finger(s): Secondary | ICD-10-CM

## 2018-05-13 NOTE — Progress Notes (Signed)
Subjective: Chief Complaint  Patient presents with  . rash    rash on forehead X november  left hand thumb pain X a while   Here for rash on forehead for the last 2-1/2 months.  He did not notice it until after he wore a hat 1 day and the rash has been there ever since.  He rarely wears hats.  He denies using any type of lotions or creams that may have caused an allergy reaction.  He denies blisters, numbness or tingling no recent body aches chills or fevers.  He does have some redness and puffiness of his forehead bilaterally that does not extend into the scalp line.  No prior similar.  He has tried some Benadryl topical ointment without improvement, he is tried hydrocortisone over-the-counter cream without improvement.  No other aggravating or relieving factors  He also reports left thumb pain for the last few weeks.  He denies any type of regular activity that he thinks it contributes to the pain.  He did have a fall while on vacation trying to go down a waterfall.  He braced himself on his right hand that did not think he injured the left thumb.  The pain is mainly at the base of the left thumb and worse with rotational activity of the thumb.  No other hand finger or wrist or arm pain.  Past Medical History:  Diagnosis Date  . Erectile dysfunction   . Family history of colon cancer    sister in 41s  . Family history of heart disease 2011   baseline cardiac eval 2011 with Dr. Sallyanne Kuster  . H/O echocardiogram 2015   Dr. Wynonia Lawman  . History of exercise stress test    04/2014 Dr. Wynonia Lawman, prior 2011 normal [facility] treadmill stress test, Dr. Debara Pickett  . LOW BACK PAIN, ACUTE 09/26/2009  . RENAL CALCULUS, RIGHT 09/28/2009   9.36mm on CT  . TOBACCO USE, QUIT 09/26/2009  . Wears glasses    Current Outpatient Medications on File Prior to Visit  Medication Sig Dispense Refill  . mupirocin ointment (BACTROBAN) 2 % Apply topically.    . pravastatin (PRAVACHOL) 20 MG tablet TAKE 1 TABLET(20 MG) BY MOUTH EVERY  EVENING (Patient not taking: Reported on 05/13/2018) 90 tablet 0  . sildenafil (REVATIO) 20 MG tablet 1-5 tablets (20 mg to 100 mg) prior to sexual activity daily prn (Patient not taking: Reported on 05/13/2018) 90 tablet 0   Current Facility-Administered Medications on File Prior to Visit  Medication Dose Route Frequency Provider Last Rate Last Dose  . 0.9 %  sodium chloride infusion  500 mL Intravenous Once Armbruster, Carlota Raspberry, MD       ROS as in subjective  Objective: BP 110/70   Pulse 72   Temp 97.6 F (36.4 C) (Oral)   Resp 16   Ht 6\' 5"  (1.956 m)   Wt 255 lb (115.7 kg)   SpO2 96%   BMI 30.24 kg/m   Gen: wd, wn, nad Skin: There is a faint patch of erythema and somewhat puffy skin left forehead and more subtle patch of erythema right forehead but no obvious demarcated scab or rash or blister just faint erythema.  No other rash on forehead arms legs or torso. He is tender over the left thumb base, pain with rotational movement.  He he denies tenderness with thumb flexion or extension but does have some pain with resisted and passive abduction and adduction.  When palpating the base of the thumb with  rotational activity there was a little bit of a pop and he noted pain.  There is no obvious swelling otherwise hands nontender no deformity no swelling no laxity. Hands and arms neurovascularly intact    Assessment: Encounter Diagnoses  Name Primary?  . Rash Yes  . Chronic pain of left thumb     Plan: Rash-unclear etiology.  Dr. Redmond School supervising physician also examined him.  I doubt rosacea, shingles, contact dermatitis, vasculitis, but possibly could be fungal.  Labs today and likely referral to dermatology.  In the meantime he can try Lamisil cream over-the-counter for the next 2 weeks  Chronic pain of left thumb-we will send for x-ray, he can use thumb spica splint for the next 1 to 2 weeks, can alternate ice and heat, can use anti-inflammatory short-term  Akul was seen  today for rash.  Diagnoses and all orders for this visit:  Rash -     CBC with Differential/Platelet -     Sedimentation rate  Chronic pain of left thumb -     DG Finger Thumb Left; Future

## 2018-05-14 LAB — CBC WITH DIFFERENTIAL/PLATELET
Basophils Absolute: 0.1 10*3/uL (ref 0.0–0.2)
Basos: 1 %
EOS (ABSOLUTE): 0.3 10*3/uL (ref 0.0–0.4)
Eos: 4 %
HEMATOCRIT: 51.4 % — AB (ref 37.5–51.0)
HEMOGLOBIN: 17.9 g/dL — AB (ref 13.0–17.7)
Immature Grans (Abs): 0 10*3/uL (ref 0.0–0.1)
Immature Granulocytes: 0 %
LYMPHS ABS: 1.5 10*3/uL (ref 0.7–3.1)
Lymphs: 19 %
MCH: 30.2 pg (ref 26.6–33.0)
MCHC: 34.8 g/dL (ref 31.5–35.7)
MCV: 87 fL (ref 79–97)
MONOCYTES: 7 %
MONOS ABS: 0.5 10*3/uL (ref 0.1–0.9)
NEUTROS ABS: 5.3 10*3/uL (ref 1.4–7.0)
Neutrophils: 69 %
Platelets: 249 10*3/uL (ref 150–450)
RBC: 5.93 x10E6/uL — ABNORMAL HIGH (ref 4.14–5.80)
RDW: 12.8 % (ref 11.6–15.4)
WBC: 7.6 10*3/uL (ref 3.4–10.8)

## 2018-05-14 LAB — SEDIMENTATION RATE: Sed Rate: 2 mm/hr (ref 0–30)

## 2018-06-23 DIAGNOSIS — L578 Other skin changes due to chronic exposure to nonionizing radiation: Secondary | ICD-10-CM | POA: Diagnosis not present

## 2018-07-08 DIAGNOSIS — Z4789 Encounter for other orthopedic aftercare: Secondary | ICD-10-CM | POA: Diagnosis not present

## 2018-07-08 DIAGNOSIS — R202 Paresthesia of skin: Secondary | ICD-10-CM | POA: Diagnosis not present

## 2018-07-08 DIAGNOSIS — R200 Anesthesia of skin: Secondary | ICD-10-CM | POA: Diagnosis not present

## 2018-08-03 DIAGNOSIS — R202 Paresthesia of skin: Secondary | ICD-10-CM | POA: Diagnosis not present

## 2018-08-03 DIAGNOSIS — R200 Anesthesia of skin: Secondary | ICD-10-CM | POA: Diagnosis not present

## 2018-09-22 DIAGNOSIS — R200 Anesthesia of skin: Secondary | ICD-10-CM | POA: Diagnosis not present

## 2018-11-01 DIAGNOSIS — R200 Anesthesia of skin: Secondary | ICD-10-CM | POA: Diagnosis not present

## 2018-11-01 DIAGNOSIS — R202 Paresthesia of skin: Secondary | ICD-10-CM | POA: Diagnosis not present

## 2018-12-20 ENCOUNTER — Other Ambulatory Visit: Payer: Self-pay

## 2018-12-20 DIAGNOSIS — Z1159 Encounter for screening for other viral diseases: Secondary | ICD-10-CM | POA: Diagnosis not present

## 2018-12-20 DIAGNOSIS — Z20822 Contact with and (suspected) exposure to covid-19: Secondary | ICD-10-CM

## 2018-12-22 LAB — SPECIMEN STATUS REPORT

## 2018-12-22 LAB — NOVEL CORONAVIRUS, NAA: SARS-CoV-2, NAA: NOT DETECTED

## 2018-12-23 ENCOUNTER — Other Ambulatory Visit: Payer: Self-pay | Admitting: Medical

## 2018-12-23 DIAGNOSIS — J019 Acute sinusitis, unspecified: Secondary | ICD-10-CM | POA: Diagnosis not present

## 2018-12-23 NOTE — Telephone Encounter (Signed)
Is this ok to refill?  

## 2018-12-23 NOTE — Telephone Encounter (Signed)
See if he requested this as it looks like auto refill request. He is also due for physical.

## 2018-12-24 NOTE — Telephone Encounter (Signed)
Left message on voicemail for patient to call back. 

## 2018-12-24 NOTE — Telephone Encounter (Signed)
Is this ok to refill?  

## 2018-12-24 NOTE — Telephone Encounter (Signed)
See my prior msg 

## 2018-12-24 NOTE — Telephone Encounter (Signed)
Patient scheduled appointment for CPE in September and would like a a refill please

## 2018-12-25 ENCOUNTER — Telehealth: Payer: Self-pay | Admitting: Medical

## 2018-12-25 NOTE — Telephone Encounter (Signed)
P.A. TADALAFIL 

## 2019-01-01 NOTE — Telephone Encounter (Signed)
P.A. approved, called pharmacy & went thru for $10, left message for pt  °

## 2019-01-04 DIAGNOSIS — R202 Paresthesia of skin: Secondary | ICD-10-CM | POA: Diagnosis not present

## 2019-01-04 DIAGNOSIS — M779 Enthesopathy, unspecified: Secondary | ICD-10-CM | POA: Diagnosis not present

## 2019-01-04 DIAGNOSIS — R200 Anesthesia of skin: Secondary | ICD-10-CM | POA: Diagnosis not present

## 2019-01-04 DIAGNOSIS — M2042 Other hammer toe(s) (acquired), left foot: Secondary | ICD-10-CM | POA: Diagnosis not present

## 2019-01-18 ENCOUNTER — Encounter: Payer: Self-pay | Admitting: Medical

## 2019-01-18 NOTE — Progress Notes (Deleted)
Chart prep:  Cancer screening  Colon cancer screening  Your last colonoscopy was April 2019 with Dr. [place] Cellar  Given the finding of tubular adenoma, you are probably due for repeat in 2014  Prostate cancer screening  Your last PSA was 06/2017, normal   Vaccines:  You are up-to-date on tetanus back in 2015  I recommend a yearly flu shot  I recommend a 2 shot shingles vaccine since you are over 50   Labs CMET CBC Lipid?  Low HDL Std?    Significant issues  Hyperlipidemia  We will check routine labs today  Your HDL good cholesterol was low last year  Continue statin/Pravachol daily at bedtime Recommendations for improving lipids: Foods TO AVOID or limit - fried foods, high sugar foods, white bread, enriched flour, fast food, red meat, large amounts of cheese, processed foods such as little debbie cakes, cookies, pies, donuts, for example Foods TO INCLUDE in the diet - whole grains such as whole grain pasta, whole grain bread, barley, steel cut oatmeal (not instant oatmeal), avocado, fish, green leafy vegetables, nuts, increased fiber in diet, and using olive oil in small amounts for cooking or as salad dressing vinaigrette.    Tobacco use?   Erectile dysfunction  Continue ***

## 2019-01-20 DIAGNOSIS — R202 Paresthesia of skin: Secondary | ICD-10-CM | POA: Diagnosis not present

## 2019-01-20 DIAGNOSIS — R200 Anesthesia of skin: Secondary | ICD-10-CM | POA: Diagnosis not present

## 2019-01-20 DIAGNOSIS — M779 Enthesopathy, unspecified: Secondary | ICD-10-CM | POA: Diagnosis not present

## 2019-01-27 ENCOUNTER — Encounter: Payer: BC Managed Care – PPO | Admitting: Medical

## 2019-01-28 ENCOUNTER — Other Ambulatory Visit: Payer: Self-pay

## 2019-01-28 ENCOUNTER — Encounter: Payer: Self-pay | Admitting: Medical

## 2019-01-28 ENCOUNTER — Ambulatory Visit: Payer: BC Managed Care – PPO | Admitting: Medical

## 2019-01-28 VITALS — BP 118/80 | HR 73 | Temp 97.7°F | Ht 77.0 in | Wt 237.6 lb

## 2019-01-28 DIAGNOSIS — Z0000 Encounter for general adult medical examination without abnormal findings: Secondary | ICD-10-CM | POA: Diagnosis not present

## 2019-01-28 DIAGNOSIS — Z7189 Other specified counseling: Secondary | ICD-10-CM

## 2019-01-28 DIAGNOSIS — Z1159 Encounter for screening for other viral diseases: Secondary | ICD-10-CM

## 2019-01-28 DIAGNOSIS — Z8249 Family history of ischemic heart disease and other diseases of the circulatory system: Secondary | ICD-10-CM

## 2019-01-28 DIAGNOSIS — Z800 Family history of malignant neoplasm of digestive organs: Secondary | ICD-10-CM | POA: Diagnosis not present

## 2019-01-28 DIAGNOSIS — K4090 Unilateral inguinal hernia, without obstruction or gangrene, not specified as recurrent: Secondary | ICD-10-CM

## 2019-01-28 DIAGNOSIS — I359 Nonrheumatic aortic valve disorder, unspecified: Secondary | ICD-10-CM | POA: Insufficient documentation

## 2019-01-28 DIAGNOSIS — N5201 Erectile dysfunction due to arterial insufficiency: Secondary | ICD-10-CM

## 2019-01-28 DIAGNOSIS — Z7185 Encounter for immunization safety counseling: Secondary | ICD-10-CM

## 2019-01-28 MED ORDER — VIAGRA 100 MG PO TABS: 50.0000 mg | ORAL_TABLET | Freq: Every day | ORAL | 2 refills | Status: DC | PRN

## 2019-01-28 NOTE — Addendum Note (Signed)
Addended by: Carlena Hurl on: 01/28/2019 11:03 AM   Modules accepted: Orders

## 2019-01-28 NOTE — Progress Notes (Signed)
Subjective:   HPI  Mitchell Hughes is a 53 y.o. male who presents for Chief Complaint  Patient presents with  . Annual Exam    Patient Care Team: Izzie Geers, Leward Quan as PCP - General (Family Medicine) Sees dentist Sees eye doctor Dr. Erline Hau, podiatry Dr. [place] Cellar, gastroenterology Dr. Janace Litten, neurology Dr. Baird Lyons, pulmonology Dr. Tollie Eth prior with cardiology    Concerns: ED - not improved on Cialis, has had some benefit with generic sildenafil.  Trouble getting and keeping erections.  Reviewed their medical, surgical, family, social, medication, and allergy history and updated chart as appropriate.  Past Medical History:  Diagnosis Date  . Erectile dysfunction   . Family history of colon cancer    sister in 56s  . Family history of heart disease 2011   baseline cardiac eval 2011 with Dr. Sallyanne Kuster  . H/O echocardiogram 2015   Dr. Wynonia Lawman  . History of exercise stress test    04/2014 Dr. Wynonia Lawman, prior 2011 normal Bruce treadmill stress test, Dr. Debara Pickett  . LOW BACK PAIN, ACUTE 09/26/2009  . RENAL CALCULUS, RIGHT 09/28/2009   9.54mm on CT  . TOBACCO USE, QUIT 09/26/2009  . Wears glasses     Past Surgical History:  Procedure Laterality Date  . COLONOSCOPY  08/2017   tubular adenoma, repeat 5 years; Dr. Sims Cellar  . FOOT SURGERY     bone spur, right; Dr. Milinda Pointer  . WISDOM TOOTH EXTRACTION      Social History   Socioeconomic History  . Marital status: Single    Spouse name: Not on file  . Number of children: Not on file  . Years of education: Not on file  . Highest education level: Not on file  Occupational History  . Not on file  Social Needs  . Financial resource strain: Not on file  . Food insecurity    Worry: Not on file    Inability: Not on file  . Transportation needs    Medical: Not on file    Non-medical: Not on file  Tobacco Use  . Smoking status: Former Smoker    Packs/day: 0.25    Years: 30.00   Pack years: 7.50    Quit date: 03/05/2009    Years since quitting: 9.9  . Smokeless tobacco: Never Used  . 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 Alcohol use: Yes    Alcohol/week: 4.0 standard drinks    Types: 4 Cans of beer per week    Comment: occ  . Drug use: No  . Sexual activity: Not on file  Lifestyle  . Physical activity    Days per week: Not on file    Minutes per session: Not on file  . Stress: Not on file  Relationships  . Social Herbalist on phone: Not on file    Gets together: Not on file    Attends religious service: Not on file    Active member of club or organization: Not on file    Attends meetings of clubs or organizations: Not on file    Relationship status: Not on file  . Intimate partner violence    Fear of current or ex partner: Not on file    Emotionally abused: Not on file    Physically abused: Not on file    Forced sexual activity: Not on file  Other Topics Concern  . Not on file  Social History Narrative   Single, 2 children, sister staying with him currently, works in a warehouse, exercise -  Ellipitical, weight training, walking.  01/2019    Family History  Problem Relation Age of Onset  . Thyroid disease Mother   . Other Mother        brain disease, possibly dementia?  . Gallstones Mother   . Other Father        spinal cord injury/accident  . Heart disease Father 57  . Heart disease Sister 57       artificial valve  . Colon cancer Sister   . Heart disease Brother 46       MI  . Diabetes Brother   . Pancreatic cancer Brother   . Cancer Sister 55       colon  . Cancer Sister        pancreas  . Pancreatic cancer Sister   . Heart disease Sister   . Heart disease Brother 50       pacemaker  . Cancer Brother        liver, formerly pancreas  . Heart disease Other        parent, other relative  . Colon cancer Other   . Stroke Neg Hx   . Hypertension Neg  Hx   . Rectal cancer Neg Hx   . Stomach cancer Neg Hx      Current Outpatient Medications:  .  gabapentin (NEURONTIN) 100 MG capsule, Take by mouth., Disp: , Rfl:  .  tadalafil (CIALIS) 20 MG tablet, TAKE 1 TABLET(20 MG) BY MOUTH DAILY AS NEEDED FOR ERECTILE DYSFUNCTION, Disp: 10 tablet, Rfl: 1 .  mupirocin ointment (BACTROBAN) 2 %, Apply topically., Disp: , Rfl:  .  pravastatin (PRAVACHOL) 20 MG tablet, TAKE 1 TABLET(20 MG) BY MOUTH EVERY EVENING (Patient not taking: Reported on 05/13/2018), Disp: 90 tablet, Rfl: 0 .  sildenafil (REVATIO) 20 MG tablet, 1-5 tablets (20 mg to 100 mg) prior to sexual activity daily prn (Patient not taking: Reported on 05/13/2018), Disp: 90 tablet, Rfl: 0 .  VIAGRA 100 MG tablet, Take 0.5-1 tablets (50-100 mg total) by mouth daily as needed for erectile dysfunction., Disp: 10 tablet, Rfl: 2  Current Facility-Administered Medications:  .  0.9 %  sodium chloride infusion, 500 mL, Intravenous, Once, Armbruster, Carlota Raspberry, MD  No Known Allergies     Review of Systems Constitutional: -fever, -chills, -sweats, -unexpected weight change, -decreased appetite, -fatigue Allergy: -sneezing, -itching, -congestion Dermatology: -changing moles, --rash, -lumps ENT: -runny nose, -ear pain, -sore throat, -hoarseness, -sinus pain, -teeth pain, - ringing in ears, -hearing loss, -nosebleeds Cardiology: -chest pain, -palpitations, -swelling, -difficulty breathing when lying flat, -waking up short of breath Respiratory: -cough, -shortness of breath, -difficulty breathing with exercise or exertion, -wheezing, -coughing up blood Gastroenterology: -abdominal pain, -nausea, -vomiting, -diarrhea, -constipation, -blood in stool, -changes in bowel movement, -difficulty swallowing or eating Hematology: -bleeding, -bruising  Musculoskeletal: -joint aches, -muscle aches, -joint swelling, -back pain, -neck pain, -cramping, -changes in gait Ophthalmology: denies vision changes, eye redness,  itching, discharge Urology: -burning with urination, -difficulty urinating, -blood in urine, -urinary frequency, -urgency, -incontinence Neurology: -headache, -weakness, -tingling, -numbness, -memory loss, -falls, -dizziness Psychology: -depressed mood, -agitation, -sleep problems Male GU: no testicular mass, pain, no lymph nodes swollen, no swelling, no rash.     Objective:  BP 118/80   Pulse 73   Temp 97.7 F (36.5 C)   Ht 6\' 5"  (1.956 m)   Wt 237 lb 9.6 oz (  107.8 kg)   SpO2 97%   BMI 28.18 kg/m    Wt Readings from Last 3 Encounters:  01/28/19 237 lb 9.6 oz (107.8 kg)  05/13/18 255 lb (115.7 kg)  08/03/17 243 lb (110.2 kg)    General appearance: alert, no distress, WD/WN, African American male Skin: no worrisome lesions HEENT: normocephalic, conjunctiva/corneas normal, sclerae anicteric, PERRLA, EOMi, nares patent, no discharge or erythema, pharynx normal Oral cavity: MMM, tongue normal, teeth normal Neck: supple, no lymphadenopathy, no thyromegaly, no masses, normal ROM, no bruits Chest: non tender, normal shape and expansion Heart: 2/6 brief systolic murmur heard best in right upper sternal border, otherwise RRR, normal S1, S2 Lungs: CTA bilaterally, no wheezes, rhonchi, or rales Abdomen: +bs, soft, non tender, non distended, no masses, no hepatomegaly, no splenomegaly, no bruits Back: non tender, normal ROM, no scoliosis Musculoskeletal: upper extremities non tender, no obvious deformity, normal ROM throughout, lower extremities non tender, no obvious deformity, normal ROM throughout Extremities: no edema, no cyanosis, no clubbing Pulses: 2+ symmetric, upper and lower extremities, normal cap refill Neurological: alert, oriented x 3, CN2-12 intact, strength normal upper extremities and lower extremities, sensation normal throughout, DTRs 2+ throughout, no cerebellar signs, gait normal Psychiatric: normal affect, behavior normal, pleasant  GU: normal male external  genitalia,uncircumcised, non tender, no masses, mild right reducible inguinal hernia, no lymphadenopathy Rectal: deferred   Assessment and Plan :   Encounter Diagnoses  Name Primary?  . Routine general medical examination at a health care facility Yes  . Vaccine counseling   . Family history of colon cancer   . Family history of heart disease   . Erectile dysfunction due to arterial insufficiency   . Right inguinal hernia   . Aortic valve calcification     Physical exam - discussed and counseled on healthy lifestyle, diet, exercise, preventative care, vaccinations, sick and well care, proper use of emergency dept and after hours care, and addressed their concerns.    Health screening: See your eye doctor yearly for routine vision care. See your dentist yearly for routine dental care including hygiene visits twice yearly.  Cancer screening Colonoscopy reviewed from April 2019 with Dr. Havery Moros, polyps found.  Tubular adenoma on pathology, repeat 5 years  PSA reviewed from 2019 normal  We discussed skin surveillance   Vaccines: Up-to-date on Tdap  Shingles vaccine:  I recommend you have a shingles vaccine to help prevent shingles or herpes zoster outbreak.   Please call your insurer to inquire about coverage for the Shingrix vaccine given in 2 doses.   Some insurers cover this vaccine after age 5, some cover this after age 86.  If your insurer covers this, then call to schedule appointment to have this vaccine here.  He declines flu shot, gets this free at work    separate significant chronic issues discussed: Right inguinal hernia - small, discussed findings, watch and wait for now  ED - not great response with Cialis or Sildenafil.  Begin trial of Viagra name brand.  Discussed risks/benefits of medication, discussed other treatment options  Aortic valve calcification 2015, eval with Dr Wynonia Lawman who has since retired.  Plan for updated echocardiogram   Stanislav was seen  today for annual exam.  Diagnoses and all orders for this visit:  Routine general medical examination at a health care facility -     Comprehensive metabolic panel -     CBC with Differential/Platelet -     Lipid panel -     ECHOCARDIOGRAM  COMPLETE; Future  Vaccine counseling  Family history of colon cancer  Family history of heart disease  Erectile dysfunction due to arterial insufficiency  Right inguinal hernia  Aortic valve calcification -     ECHOCARDIOGRAM COMPLETE; Future  Other orders -     VIAGRA 100 MG tablet; Take 0.5-1 tablets (50-100 mg total) by mouth daily as needed for erectile dysfunction.    Follow-up pending labs, yearly for physical

## 2019-01-28 NOTE — Patient Instructions (Signed)
Thanks for trusting Mitchell Hughes with your health care and for coming in for a physical today.  Below are some general recommendations I have for you:  Yearly screenings See your eye doctor yearly for routine vision care. See your dentist yearly for routine dental care including hygiene visits twice yearly. See me here yearly for a routine physical and preventative care visit   Specific Concerns today:  . We will plan for updated heart echocardiogram . Shingles vaccine:  I recommend you have a shingles vaccine to help prevent shingles or herpes zoster outbreak.   Please call your insurer to inquire about coverage for the Shingrix vaccine given in 2 doses.   Some insurers cover this vaccine after age 54, some cover this after age 59.  If your insurer covers this, then call to schedule appointment to have this vaccine here.    Please follow up yearly for a physical.   Preventative Care for Adults - Male      [place]:  A routine yearly physical is a good way to check in with your primary care provider about your health and preventive screening. It is also an opportunity to share updates about your health and any concerns you have, and receive a thorough all-over exam.   Most health insurance companies pay for at least some preventative services.  Check with your health plan for specific coverages.  WHAT PREVENTATIVE SERVICES DO MEN NEED?  Adult men should have their weight and blood pressure checked regularly.   Men age 37 and older should have their cholesterol levels checked regularly.  Beginning at age 52 and continuing to age 50, men should be screened for colorectal cancer.  Certain people may need continued testing until age 58.  Updating vaccinations is part of preventative care.  Vaccinations help protect against diseases such as the flu.  Osteoporosis is a disease in which the bones lose minerals and strength as we age. Men ages 73 and over should discuss this  with their caregivers  Lab tests are generally done as part of preventative care to screen for anemia and blood disorders, to screen for problems with the kidneys and liver, to screen for bladder problems, to check blood sugar, and to check your cholesterol level.  Preventative services generally include counseling about diet, exercise, avoiding tobacco, drugs, excessive alcohol consumption, and sexually transmitted infections.    GENERAL RECOMMENDATIONS FOR GOOD HEALTH:  Healthy diet:  Eat a variety of foods, including fruit, vegetables, animal or vegetable protein, such as meat, fish, chicken, and eggs, or beans, lentils, tofu, and grains, such as rice.  Drink plenty of water daily.  Decrease saturated fat in the diet, avoid lots of red meat, processed foods, sweets, fast foods, and fried foods.  Exercise:  Aerobic exercise helps maintain good heart health. At least 30-40 minutes of moderate-intensity exercise is recommended. For example, a brisk walk that increases your heart rate and breathing. This should be done on most days of the week.   Find a type of exercise or a variety of exercises that you enjoy so that it becomes a part of your daily life.  Examples are running, walking, swimming, water aerobics, and biking.  For motivation and support, explore group exercise such as aerobic class, spin class, Zumba, Yoga,or  martial arts, etc.    Set exercise goals for yourself, such as a certain weight goal, walk or run in a race such as a 5k walk/run.  Speak to your primary care  provider about exercise goals.  Disease prevention:  If you smoke or chew tobacco, find out from your caregiver how to quit. It can literally save your life, no matter how long you have been a tobacco user. If you do not use tobacco, never begin.   Maintain a healthy diet and normal weight. Increased weight leads to problems with blood pressure and diabetes.   The Body Mass Index or BMI is a way of measuring  how much of your body is fat. Having a BMI above 27 increases the risk of heart disease, diabetes, hypertension, stroke and other problems related to obesity. Your caregiver can help determine your BMI and based on it develop an exercise and dietary program to help you achieve or maintain this important measurement at a healthful level.  High blood pressure causes heart and blood vessel problems.  Persistent high blood pressure should be treated with medicine if weight loss and exercise do not work.   Fat and cholesterol leaves deposits in your arteries that can block them. This causes heart disease and vessel disease elsewhere in your body.  If your cholesterol is found to be high, or if you have heart disease or certain other medical conditions, then you may need to have your cholesterol monitored frequently and be treated with medication.   Ask if you should have a cardiac stress test if your history suggests this. A stress test is a test done on a treadmill that looks for heart disease. This test can find disease prior to there being a problem.  Osteoporosis is a disease in which the bones lose minerals and strength as we age. This can result in serious bone fractures. Risk of osteoporosis can be identified using a bone density scan. Men ages 51 and over should discuss this with their caregivers. Ask your caregiver whether you should be taking a calcium supplement and Vitamin D, to reduce the rate of osteoporosis.   Avoid drinking alcohol in excess (more than two drinks per day).  Avoid use of street drugs. Do not share needles with anyone. Ask for professional help if you need assistance or instructions on stopping the use of alcohol, cigarettes, and/or drugs.  Brush your teeth twice a day with fluoride toothpaste, and floss once a day. Good oral hygiene prevents tooth decay and gum disease. The problems can be painful, unattractive, and can cause other health problems. Visit your dentist for a  routine oral and dental check up and preventive care every 6-12 months.   Look at your skin regularly.  Use a mirror to look at your back. Notify your caregivers of changes in moles, especially if there are changes in shapes, colors, a size larger than a pencil eraser, an irregular border, or development of new moles.  Safety:  Use seatbelts 100% of the time, whether driving or as a passenger.  Use safety devices such as hearing protection if you work in environments with loud noise or significant background noise.  Use safety glasses when doing any work that could send debris in to the eyes.  Use a helmet if you ride a bike or motorcycle.  Use appropriate safety gear for contact sports.  Talk to your caregiver about gun safety.  Use sunscreen with a SPF (or skin protection factor) of 15 or greater.  Lighter skinned people are at a greater risk of skin cancer. Don't forget to also wear sunglasses in order to protect your eyes from too much damaging sunlight. Damaging sunlight can accelerate  cataract formation.   Practice safe sex. Use condoms. Condoms are used for birth control and to help reduce the spread of sexually transmitted infections (or STIs).  Some of the STIs are gonorrhea (the clap), chlamydia, syphilis, trichomonas, herpes, HPV (human papilloma virus) and HIV (human immunodeficiency virus) which causes AIDS. The herpes, HIV and HPV are viral illnesses that have no cure. These can result in disability, cancer and death.   Keep carbon monoxide and smoke detectors in your home functioning at all times. Change the batteries every 6 months or use a model that plugs into the wall.   Vaccinations:  Stay up to date with your tetanus shots and other required immunizations. You should have a booster for tetanus every 10 years. Be sure to get your flu shot every year, since 5%-20% of the [place] population comes down with the flu. The flu vaccine changes each year, so being vaccinated once is not  enough. Get your shot in the fall, before the flu season peaks.   Other vaccines to consider:  Human Papilloma Virus or HPV causes cancer of the cervix, and other infections that can be transmitted from person to person. There is a vaccine for HPV, and males should get immunized between the ages of 14 and 40. It requires a series of 3 shots.   Pneumococcal vaccine to protect against certain types of pneumonia.  This is normally recommended for adults age 9 or older.  However, adults younger than 55 years old with certain underlying conditions such as diabetes, heart or lung disease should also receive the vaccine.  Shingles vaccine to protect against Varicella Zoster if you are older than age 45, or younger than 55 years old with certain underlying illness.  If you have not had the Shingrix vaccine, please call your insurer to inquire about coverage for the Shingrix vaccine given in 2 doses.   Some insurers cover this vaccine after age 60, some cover this after age 35.  If your insurer covers this, then call to schedule appointment to have this vaccine here  Hepatitis A vaccine to protect against a form of infection of the liver by a virus acquired from food.  Hepatitis B vaccine to protect against a form of infection of the liver by a virus acquired from blood or body fluids, particularly if you work in health care.  If you plan to travel internationally, check with your local health department for specific vaccination recommendations.   What should I know about cancer screening? Many types of cancers can be detected early and may often be prevented. Lung Cancer  You should be screened every year for lung cancer if: ? You are a current smoker who has smoked for at least 30 years. ? You are a former smoker who has quit within the past 15 years.  Talk to your health care provider about your screening options, when you should start screening, and how often you should be screened.  Colorectal  Cancer  Routine colorectal cancer screening usually begins at 55 years of age and should be repeated every 5-10 years until you are 55 years old. You may need to be screened more often if early forms of precancerous polyps or small growths are found. Your health care provider may recommend screening at an earlier age if you have risk factors for colon cancer.  Your health care provider may recommend using home test kits to check for hidden blood in the stool.  A small camera at the end  of a tube can be used to examine your colon (sigmoidoscopy or colonoscopy). This checks for the earliest forms of colorectal cancer.  Prostate and Testicular Cancer  Depending on your age and overall health, your health care provider may do certain tests to screen for prostate and testicular cancer.  Talk to your health care provider about any symptoms or concerns you have about testicular or prostate cancer.  Skin Cancer  Check your skin from head to toe regularly.  Tell your health care provider about any new moles or changes in moles, especially if: ? There is a change in a mole's size, shape, or color. ? You have a mole that is larger than a pencil eraser.  Always use sunscreen. Apply sunscreen liberally and repeat throughout the day.  Protect yourself by wearing long sleeves, pants, a wide-brimmed hat, and sunglasses when outside.

## 2019-01-29 LAB — CBC WITH DIFFERENTIAL/PLATELET
Basophils Absolute: 0.1 10*3/uL (ref 0.0–0.2)
Basos: 1 %
EOS (ABSOLUTE): 0.1 10*3/uL (ref 0.0–0.4)
Eos: 2 %
Hematocrit: 49.7 % (ref 37.5–51.0)
Hemoglobin: 16.9 g/dL (ref 13.0–17.7)
Immature Grans (Abs): 0 10*3/uL (ref 0.0–0.1)
Immature Granulocytes: 1 %
Lymphocytes Absolute: 1.7 10*3/uL (ref 0.7–3.1)
Lymphs: 19 %
MCH: 30.2 pg (ref 26.6–33.0)
MCHC: 34 g/dL (ref 31.5–35.7)
MCV: 89 fL (ref 79–97)
Monocytes Absolute: 0.4 10*3/uL (ref 0.1–0.9)
Monocytes: 5 %
Neutrophils Absolute: 6.5 10*3/uL (ref 1.4–7.0)
Neutrophils: 72 %
Platelets: 258 10*3/uL (ref 150–450)
RBC: 5.59 x10E6/uL (ref 4.14–5.80)
RDW: 13.6 % (ref 11.6–15.4)
WBC: 8.8 10*3/uL (ref 3.4–10.8)

## 2019-01-29 LAB — COMPREHENSIVE METABOLIC PANEL
ALT: 30 IU/L (ref 0–44)
AST: 21 IU/L (ref 0–40)
Albumin/Globulin Ratio: 2 (ref 1.2–2.2)
Albumin: 4.2 g/dL (ref 3.8–4.9)
Alkaline Phosphatase: 59 IU/L (ref 39–117)
BUN/Creatinine Ratio: 12 (ref 9–20)
BUN: 13 mg/dL (ref 6–24)
Bilirubin Total: 0.6 mg/dL (ref 0.0–1.2)
CO2: 21 mmol/L (ref 20–29)
Calcium: 9.3 mg/dL (ref 8.7–10.2)
Chloride: 105 mmol/L (ref 96–106)
Creatinine, Ser: 1.05 mg/dL (ref 0.76–1.27)
GFR calc Af Amer: 92 mL/min/{1.73_m2} (ref >59)
GFR calc non Af Amer: 80 mL/min/{1.73_m2} (ref >59)
Globulin, Total: 2.1 g/dL (ref 1.5–4.5)
Glucose: 111 mg/dL — ABNORMAL HIGH (ref 65–99)
Potassium: 4.7 mmol/L (ref 3.5–5.2)
Sodium: 142 mmol/L (ref 134–144)
Total Protein: 6.3 g/dL (ref 6.0–8.5)

## 2019-01-29 LAB — HEPATITIS C ANTIBODY: Hep C Virus Ab: 0.3 s/co ratio (ref 0.0–0.9)

## 2019-01-29 LAB — LIPID PANEL
Chol/HDL Ratio: 3.6 ratio (ref 0.0–5.0)
Cholesterol, Total: 147 mg/dL (ref 100–199)
HDL: 41 mg/dL (ref >39)
LDL Chol Calc (NIH): 92 mg/dL (ref 0–99)
Triglycerides: 72 mg/dL (ref 0–149)
VLDL Cholesterol Cal: 14 mg/dL (ref 5–40)

## 2019-01-31 ENCOUNTER — Other Ambulatory Visit: Payer: Self-pay | Admitting: Medical

## 2019-01-31 ENCOUNTER — Telehealth: Payer: Self-pay | Admitting: Family Medicine

## 2019-01-31 MED ORDER — PRAVASTATIN SODIUM 20 MG PO TABS: 20.0000 mg | ORAL_TABLET | Freq: Every evening | ORAL | 3 refills | Status: DC

## 2019-01-31 MED ORDER — SILDENAFIL CITRATE 100 MG PO TABS: 100.0000 mg | ORAL_TABLET | Freq: Every day | ORAL | 2 refills | Status: DC | PRN

## 2019-01-31 NOTE — Telephone Encounter (Signed)
Pt called and his insurance will not cover VIAGRA.  Insurance will cover Sildenafil, please send that into [employer].    100 mg

## 2019-02-04 ENCOUNTER — Other Ambulatory Visit (HOSPITAL_COMMUNITY): Payer: BLUE CROSS/BLUE SHIELD

## 2019-02-05 ENCOUNTER — Telehealth: Payer: Self-pay | Admitting: Medical

## 2019-02-05 NOTE — Telephone Encounter (Signed)
P.A. SILDENAFIL  

## 2019-02-07 ENCOUNTER — Other Ambulatory Visit: Payer: Self-pay

## 2019-02-07 ENCOUNTER — Ambulatory Visit (HOSPITAL_COMMUNITY)
Admission: RE | Admit: 2019-02-07 | Discharge: 2019-02-07 | Disposition: A | Discharge status: Home / Self Care | Payer: BC Managed Care – PPO | Source: Ambulatory Visit | Attending: Medical | Admitting: Medical

## 2019-02-07 DIAGNOSIS — I359 Nonrheumatic aortic valve disorder, unspecified: Secondary | ICD-10-CM | POA: Insufficient documentation

## 2019-02-07 DIAGNOSIS — Z0000 Encounter for general adult medical examination without abnormal findings: Secondary | ICD-10-CM | POA: Diagnosis not present

## 2019-02-07 NOTE — Progress Notes (Signed)
Echocardiogram 2D Echocardiogram has been performed.  Mitchell Hughes Mitchell Hughes 02/07/2019, 9:05 AM

## 2019-02-14 ENCOUNTER — Other Ambulatory Visit: Payer: Self-pay

## 2019-02-14 DIAGNOSIS — Z20828 Contact with and (suspected) exposure to other viral communicable diseases: Secondary | ICD-10-CM | POA: Diagnosis not present

## 2019-02-14 DIAGNOSIS — Z20822 Contact with and (suspected) exposure to covid-19: Secondary | ICD-10-CM

## 2019-02-15 LAB — NOVEL CORONAVIRUS, NAA: SARS-CoV-2, NAA: NOT DETECTED

## 2019-02-16 ENCOUNTER — Ambulatory Visit: Payer: BC Managed Care – PPO | Admitting: Medical

## 2019-02-16 ENCOUNTER — Other Ambulatory Visit: Payer: Self-pay

## 2019-02-16 ENCOUNTER — Encounter: Payer: Self-pay | Admitting: Medical

## 2019-02-16 VITALS — Temp 96.6°F | Ht 77.0 in | Wt 230.0 lb

## 2019-02-16 DIAGNOSIS — R197 Diarrhea, unspecified: Secondary | ICD-10-CM | POA: Diagnosis not present

## 2019-02-16 NOTE — Progress Notes (Signed)
  Subjective:     Patient ID: Mitchell Hughes, male   DOB: October 16, 1965, 53 y.o.   MRN: [id number]  This visit type was conducted due to national recommendations for restrictions regarding the COVID-19 Pandemic (e.g. social distancing) in an effort to limit this patient's exposure and mitigate transmission in our community.  Due to their co-morbid illnesses, this patient is at least at moderate risk for complications without adequate follow up.  This format is felt to be most appropriate for this patient at this time.    Documentation for virtual audio and video telecommunications through Zoom encounter:  The patient was located at home. The provider was located in the office. The patient did consent to this visit and is aware of possible charges through their insurance for this visit.  The other persons participating in this telemedicine service were none. Time spent on call was 15 minutes and in review of previous records >15 minutes total.  This virtual service is not related to other E/M service within previous 7 days.   HPI Chief Complaint  Patient presents with  . Diarrhea    started past Saturday   Virtual consult today for diarrhea. Been having diarrhea, few days.  Improving, but at one point, was having diarrhea q30-60 min.  No blood in the stool.  The day before and the day of the diarrhea started he had a temperature up to 102.  He was sent home from work.  He went had a Covid test earlier in the week that has now resulted as negative.  The stool is dark but not black and no obvious blood in the stool.  no recent NSAIDs.  He drinks red wine on occasion and recently did have some red wine, approximately 1 glass per night the last several nights prior to the diarrhea started   He denies abdominal pain, no back pain.  no mucus in the stool.  No recent travel.  No sick contacts with Covid or diarrhea   He denies nausea, vomiting.  No recent travel.  No recent use of antibiotic  No other  aggravating or relieving factors. No other complaint.  Review of Systems As in subjective    Objective:   Physical Exam Due to coronavirus pandemic stay at home measures, patient visit was virtual and they were not examined in person.   Temp (!) 96.6 F (35.9 C)   Ht 6\' 5"  (1.956 m)   Wt 230 lb (104.3 kg)   BMI 27.27 kg/m       Assessment:     Encounter Diagnosis  Name Primary?  . Diarrhea, unspecified type Yes       Plan:     We discussed his symptoms, possible differential.  I suspect viral gastroenteritis.  Advise good hydration, relative rest, can use Pepto-Bismol if needed, if symptoms not gradually improved the next few days and call back.  If new symptoms such as blood in the stool, worsening fever, severe pain, then recheck.  I reviewed his colonoscopy from April 2019 showing diverticulosis.  He has never had diverticulitis.  We discussed symptoms of that versus viral stomach virus  Adarious was seen today for diarrhea.  Diagnoses and all orders for this visit:  Diarrhea, unspecified type

## 2019-02-17 ENCOUNTER — Encounter: Payer: Self-pay | Admitting: Medical

## 2019-02-19 NOTE — Telephone Encounter (Signed)
Recv'd approval and denial, will call plan

## 2019-03-08 DIAGNOSIS — Z01818 Encounter for other preprocedural examination: Secondary | ICD-10-CM | POA: Diagnosis not present

## 2019-03-08 DIAGNOSIS — R202 Paresthesia of skin: Secondary | ICD-10-CM | POA: Diagnosis not present

## 2019-03-08 DIAGNOSIS — R200 Anesthesia of skin: Secondary | ICD-10-CM | POA: Diagnosis not present

## 2019-03-11 DIAGNOSIS — Z79899 Other long term (current) drug therapy: Secondary | ICD-10-CM | POA: Diagnosis not present

## 2019-03-11 DIAGNOSIS — R200 Anesthesia of skin: Secondary | ICD-10-CM | POA: Diagnosis not present

## 2019-03-11 DIAGNOSIS — M79671 Pain in right foot: Secondary | ICD-10-CM | POA: Diagnosis not present

## 2019-03-11 DIAGNOSIS — Z87891 Personal history of nicotine dependence: Secondary | ICD-10-CM | POA: Diagnosis not present

## 2019-03-11 DIAGNOSIS — G5761 Lesion of plantar nerve, right lower limb: Secondary | ICD-10-CM | POA: Diagnosis not present

## 2019-03-11 DIAGNOSIS — G5750 Tarsal tunnel syndrome, unspecified lower limb: Secondary | ICD-10-CM | POA: Diagnosis not present

## 2019-03-11 DIAGNOSIS — G5751 Tarsal tunnel syndrome, right lower limb: Secondary | ICD-10-CM | POA: Diagnosis not present

## 2019-03-11 DIAGNOSIS — G5760 Lesion of plantar nerve, unspecified lower limb: Secondary | ICD-10-CM | POA: Diagnosis not present

## 2019-03-14 NOTE — Telephone Encounter (Signed)
Called pharmacy & ins pays for #8 a month for $10

## 2019-04-22 ENCOUNTER — Other Ambulatory Visit: Payer: Self-pay

## 2019-04-22 ENCOUNTER — Ambulatory Visit: Payer: BC Managed Care – PPO | Admitting: Medical

## 2019-04-22 ENCOUNTER — Encounter: Payer: Self-pay | Admitting: Medical

## 2019-04-22 VITALS — BP 142/80 | HR 76 | Temp 98.2°F | Ht 77.0 in | Wt 251.4 lb

## 2019-04-22 DIAGNOSIS — M1812 Unilateral primary osteoarthritis of first carpometacarpal joint, left hand: Secondary | ICD-10-CM | POA: Diagnosis not present

## 2019-04-22 DIAGNOSIS — M25619 Stiffness of unspecified shoulder, not elsewhere classified: Secondary | ICD-10-CM | POA: Diagnosis not present

## 2019-04-22 DIAGNOSIS — M25511 Pain in right shoulder: Secondary | ICD-10-CM | POA: Diagnosis not present

## 2019-04-22 DIAGNOSIS — M25512 Pain in left shoulder: Secondary | ICD-10-CM

## 2019-04-22 DIAGNOSIS — G8929 Other chronic pain: Secondary | ICD-10-CM | POA: Diagnosis not present

## 2019-04-22 MED ORDER — HYDROCODONE-ACETAMINOPHEN 5-325 MG PO TABS: 1.0000 | ORAL_TABLET | Freq: Four times a day (QID) | ORAL | 0 refills | Status: DC | PRN

## 2019-04-22 MED ORDER — PREDNISONE 10 MG PO TABS: ORAL_TABLET | ORAL | 0 refills | Status: DC

## 2019-04-22 NOTE — Progress Notes (Signed)
  Subjective:    Mitchell Hughes is a 53 y.o. male who presents with bilateral shoulder pain. The symptoms began several months ago. Aggravating factors: no known event. Pain is located diffusely throughout the shoulder. Discomfort is described as aching. Symptoms are exacerbated by repetitive movements, overhead movements and lying on the shoulder. Evaluation to date: none. Therapy to date includes: rest and topical OTC arthritis cream.  Has night time pain, can't get comfortable at night.   Sleeping in recliner lately as it hurts to lie in bed.    Has ongoing pains in left thumb.  He was seen here 05/2018 for same, xray showed arthritis. He does use thumb spice sleeve periodically.  The following portions of the patient's history were reviewed and updated as appropriate: allergies, current medications, past family history, past medical history, past social history, past surgical history and problem list.  Review of Systems As in subjective   Objective:    BP (!) 142/80   Pulse 76   Temp 98.2 F (36.8 C)   Ht 6\' 5"  (1.956 m)   Wt 251 lb 6.4 oz (114 kg)   SpO2 97%   BMI 29.81 kg/m  Gen: WD, WN, nad Skin: warm, no erythema or ecchymosis Neck: supple, nontender, no mass, normal ROM Right shoulder: positive for tenderness over the acromioclavicular joint, sensory exam normal, motor exam normal, radial pulse intact and decreased internal and external ROM , no laxity  Left shoulder: positive for tenderness over the acromioclavicular joint, sensory exam normal, motor exam normal, radial pulse intact and decreased internal and external ROM , no laxity  Tender over left thumb MCP joint and pain with flexion and extension of same joint, right hand nontender, otherwise arms nontender Upper extremity pulses normal Neuro: normal UE strength, sensation, DTRs    Assessment:   Encounter Diagnoses  Name Primary?  . Chronic pain of both shoulders Yes  . Decreased range of motion of shoulder,  unspecified laterality   . Degenerative arthritis of thumb, left      Plan:   Referral to orthopedics as he will need x-rays of both shoulders, he may need further treatment of the left thumb arthritis beyond what we can offer here.  He will continue thumb spica splint for now.  Begin prednisone taper in general, hydrocodone as needed for worse pain.  Caution with sedation.  Advised relative rest.  Mitchell Hughes was seen today for shoulder pain and hand pain.  Diagnoses and all orders for this visit:  Chronic pain of both shoulders -     AMB referral to orthopedics  Decreased range of motion of shoulder, unspecified laterality -     AMB referral to orthopedics  Degenerative arthritis of thumb, left -     AMB referral to orthopedics  Other orders -     predniSONE (DELTASONE) 10 MG tablet; 6 tablets day 1, 5 tablets day 2, 4 tablets day 3, 3 tablets day 4, 2 tablets day 5, 1 tablet day 6 -     HYDROcodone-acetaminophen (NORCO) 5-325 MG tablet; Take 1 tablet by mouth every 6 (six) hours as needed.

## 2019-05-10 ENCOUNTER — Other Ambulatory Visit: Payer: Self-pay

## 2019-05-10 ENCOUNTER — Ambulatory Visit: Payer: Self-pay

## 2019-05-10 ENCOUNTER — Ambulatory Visit: Payer: BC Managed Care – PPO | Admitting: Orthopaedic Surgery

## 2019-05-10 ENCOUNTER — Other Ambulatory Visit: Payer: Self-pay | Admitting: Radiology

## 2019-05-10 ENCOUNTER — Encounter: Payer: Self-pay | Admitting: Orthopaedic Surgery

## 2019-05-10 DIAGNOSIS — M19049 Primary osteoarthritis, unspecified hand: Secondary | ICD-10-CM

## 2019-05-10 DIAGNOSIS — G8929 Other chronic pain: Secondary | ICD-10-CM

## 2019-05-10 DIAGNOSIS — M25511 Pain in right shoulder: Secondary | ICD-10-CM

## 2019-05-10 DIAGNOSIS — M25512 Pain in left shoulder: Secondary | ICD-10-CM

## 2019-05-10 DIAGNOSIS — M19042 Primary osteoarthritis, left hand: Secondary | ICD-10-CM

## 2019-05-10 NOTE — Progress Notes (Signed)
Office Visit Note   Patient: Mitchell Hughes           Date of Birth: November 20, 1965           MRN: [id number] Visit Date: 05/10/2019              Requested by: Carlena Hurl, PA-C [id number] [place],  [place] [address] PCP: Carlena Hurl, PA-C   Assessment & Plan: Visit Diagnoses:  1. Chronic right shoulder pain   2. Chronic left shoulder pain   3. [place] arthritis     Plan: We will send him to physical therapy for range of motion, strengthening, modalities and a home exercise program for both shoulders.  Having follow-up with Dr. Erlinda Hong in 2 weeks to see what type of response he had to the left thumb injection.  Initially after the injection he had good range of motion no significant pain with range of motion of the left thumb.  He may benefit from right [facility] joint arthroplasty in the future.  If his pain persist or the injection is short-lived.  He can follow-up with Dr. Erlinda Hong or [place] for his shoulder pain.  He may benefit from a right shoulder injection in the future.  Follow-Up Instructions: Return in about 2 weeks (around 05/24/2019), or Dr. Erlinda Hong.   Orders:  Orders Placed This Encounter  Procedures  . XR Shoulder Right  . XR Shoulder Left  . XR Finger Thumb Left   No orders of the defined types were placed in this encounter.     Procedures: No procedures performed   Clinical Data: No additional findings.   Subjective: Bilateral shoulder pain  Left thumb pain  HPI Mitchell Hughes is a 54 year old male were seen for the first time for left pain/arthritis.  Said left thumb pain will last year.  Said no injury.  He has tried a brace and Voltaren gel with very little relief.  However he has only been applying Voltaren gel once daily.  He is right-hand dominant.  He is also having bilateral shoulder pain left greater than right.  Reports right shoulder injection some 3 years ago and lasted until recently.  No injury to either shoulder.  Denies any radicular symptoms down either arm.   Pain is worse with overhead motion.  Pain does awaken him.    Review of Systems Negative for fevers chills shortness of breath please see HPI otherwise negative  Objective: Vital Signs: There were no vitals taken for this visit.  Physical Exam General: Well-developed well-nourished male in no acute distress. Psych: Alert and oriented x3. Left radial pulse 2+. Ortho Exam Left hand he has positive grind test of the thumb.  Negative Finkelstein's on the left.  Tenderness over the first [facility] joint with palpation.  Full range of motion of the left hand but pain with abduction of the thumb. Bilateral shoulders 5 5 strength with external and internal rotation against resistance.  Empty can test is negative bilaterally.  Impingement testing is positive bilaterally.  No weakness with liftoff tests bilaterally. Specialty Comments:  No specialty comments available.  Imaging: XR Finger Thumb Left  Result Date: 05/10/2019 Left thumb 3 views: Periarticular osteophytes present.  Mild to moderate CMC joint arthritic changes.  No acute fracture.  XR Shoulder Left  Result Date: 05/10/2019 Left shoulder 3 views: Shoulders well located.  Glenohumeral joint is well-maintained.  Subacromial space well maintained.  No significant arthritic changes.  No acute fractures  XR Shoulder Right  Result Date:  05/10/2019 Right shoulder 3 views: Shoulders well located.  Glenohumeral joint is well-maintained.  No acute fractures bony abnormalities.  Subacromial space well maintained on the Y view.    PMFS History: Patient Active Problem List   Diagnosis Date Noted  . Chronic pain of both shoulders 04/22/2019  . Decreased range of motion of shoulder 04/22/2019  . Degenerative arthritis of thumb, left 04/22/2019  . Right inguinal hernia 01/28/2019  . Aortic valve calcification 01/28/2019  . Vaccine counseling 06/22/2017  . Right hand paresthesia 06/25/2015  . Encounter for screening for lung cancer 06/25/2015  .  Encounter for hepatitis C screening test for low risk patient 06/25/2015  . Erectile dysfunction due to arterial insufficiency 12/21/2013  . Family history of heart disease 12/21/2013  . Family history of colon cancer 12/21/2013   Past Medical History:  Diagnosis Date  . Erectile dysfunction   . Family history of colon cancer    sister in 65s  . Family history of heart disease 2011   baseline cardiac eval 2011 with Dr. Sallyanne Kuster  . H/O echocardiogram 2015   Dr. Wynonia Lawman  . History of exercise stress test    04/2014 Dr. Wynonia Lawman, prior 2011 normal [place] treadmill stress test, Dr. Debara Pickett  . LOW BACK PAIN, ACUTE 09/26/2009  . RENAL CALCULUS, RIGHT 09/28/2009   9.71mm on CT  . TOBACCO USE, QUIT 09/26/2009  . Wears glasses     Family History  Problem Relation Age of Onset  . Thyroid disease Mother   . Other Mother        brain disease, possibly dementia?  . Gallstones Mother   . Other Father        spinal cord injury/accident  . Heart disease Father 63  . Heart disease Sister 46       artificial valve  . Colon cancer Sister   . Heart disease Brother 12       MI  . Diabetes Brother   . Pancreatic cancer Brother   . Cancer Sister 45       colon  . Cancer Sister        pancreas  . Pancreatic cancer Sister   . Heart disease Sister   . Heart disease Brother 23       pacemaker  . Cancer Brother        liver, formerly pancreas  . Heart disease Other        parent, other relative  . Colon cancer Other   . Stroke Neg Hx   . Hypertension Neg Hx   . Rectal cancer Neg Hx   . Stomach cancer Neg Hx     Past Surgical History:  Procedure Laterality Date  . COLONOSCOPY  08/2017   tubular adenoma, repeat 5 years; Dr. [place] Cellar  . FOOT SURGERY     bone spur, right; Dr. Milinda Pointer  . WISDOM TOOTH EXTRACTION     Social History   Occupational History  . Not on file  Tobacco Use  . Smoking status: Former Smoker    Packs/day: 0.25    Years: 30.00    Pack years: 7.50    Quit date:  03/05/2009    Years since quitting: 10.1  . Smokeless tobacco: Never Used  . 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 Alcohol use: Yes    Alcohol/week: 4.0 standard drinks    Types: 4 Cans of beer per week    Comment: occ  .  Drug use: No  . Sexual activity: Not on file       

## 2019-05-24 ENCOUNTER — Ambulatory Visit: Payer: BC Managed Care – PPO | Admitting: Orthopaedic Surgery

## 2019-05-25 ENCOUNTER — Ambulatory Visit: Payer: BC Managed Care – PPO | Admitting: Physical Therapy

## 2019-05-31 ENCOUNTER — Ambulatory Visit: Payer: BC Managed Care – PPO | Admitting: Orthopaedic Surgery

## 2019-09-07 ENCOUNTER — Other Ambulatory Visit: Payer: Self-pay | Admitting: Medical

## 2019-09-07 ENCOUNTER — Telehealth: Payer: Self-pay | Admitting: Medical

## 2019-09-07 MED ORDER — SILDENAFIL CITRATE 100 MG PO TABS: 100.0000 mg | ORAL_TABLET | Freq: Every day | ORAL | 2 refills | Status: DC | PRN

## 2019-09-07 NOTE — Telephone Encounter (Signed)
Pt called for refills of Sildenafil. Please send to [facility] on file in [place]. Pt can be reached at [phone].

## 2019-09-07 NOTE — Telephone Encounter (Signed)
done

## 2019-12-17 ENCOUNTER — Ambulatory Visit
Admission: RE | Admit: 2019-12-17 | Discharge: 2019-12-17 | Disposition: A | Discharge status: Home / Self Care | Payer: BC Managed Care – PPO | Source: Ambulatory Visit | Attending: Emergency Medicine | Admitting: Emergency Medicine

## 2019-12-17 ENCOUNTER — Other Ambulatory Visit: Payer: Self-pay

## 2019-12-17 VITALS — Ht 77.0 in | Wt 235.0 lb

## 2019-12-17 DIAGNOSIS — Z1152 Encounter for screening for COVID-19: Secondary | ICD-10-CM

## 2019-12-17 DIAGNOSIS — J069 Acute upper respiratory infection, unspecified: Secondary | ICD-10-CM

## 2019-12-17 MED ORDER — BENZONATATE 100 MG PO CAPS: 100.0000 mg | ORAL_CAPSULE | Freq: Three times a day (TID) | ORAL | 0 refills | Status: DC

## 2019-12-17 MED ORDER — FLUTICASONE PROPIONATE 50 MCG/ACT NA SUSP: 1.0000 | Freq: Every day | NASAL | 0 refills | Status: DC

## 2019-12-17 MED ORDER — PREDNISONE 10 MG PO TABS: 20.0000 mg | ORAL_TABLET | Freq: Every day | ORAL | 0 refills | Status: DC

## 2019-12-17 MED ORDER — CETIRIZINE HCL 10 MG PO TABS: 10.0000 mg | ORAL_TABLET | Freq: Every day | ORAL | 0 refills | Status: DC

## 2019-12-17 NOTE — Discharge Instructions (Signed)
COVID testing ordered.  It will take between 2-7 days for test results.  Someone will contact you regarding abnormal results.    In the meantime: You should remain isolated in your home for 10 days from symptom onset AND greater than 24 hours after symptoms resolution (absence of fever without the use of fever-reducing medication and improvement in respiratory symptoms), whichever is longer Get plenty of rest and push fluids Tessalon Perles prescribed for cough Zyrtec for nasal congestion, runny nose, and/or sore throat Fonase for nasal congestion and runny nose Low-dose steroid was prescribed Use medications daily for symptom relief Use OTC medications like ibuprofen or tylenol as needed fever or pain Call or go to the ED if you have any new or worsening symptoms such as fever, worsening cough, shortness of breath, chest tightness, chest pain, turning blue, changes in mental status, etc..Marland Kitchen

## 2019-12-17 NOTE — ED Triage Notes (Addendum)
Cough and sinus congestion since wed.  Has tried otc medications w no relief. Pt has had both covid vaccines and neg rapid covid test.

## 2019-12-17 NOTE — ED Provider Notes (Signed)
[facility]   [id number] 12/17/19 Arrival Time: 6440   CC: COVID symptoms  SUBJECTIVE: History from: patient.  Mitchell Hughes is a 54 y.o. male who presented to the urgent care for complaint of cough, nasal congestion for the past 3 days.  Denies sick exposure to COVID, flu or strep.  Denies recent travel.  Has tried OTC medication without relief.  Denies aggravating factors.  Denies previous symptoms in the past.   Denies fever, chills, fatigue, sinus pain, rhinorrhea, sore throat, SOB, wheezing, chest pain, nausea, changes in bowel or bladder habits.     ROS: As per HPI.  All other pertinent ROS negative.     Past Medical History:  Diagnosis Date  . Erectile dysfunction   . Family history of colon cancer    sister in 62s  . Family history of heart disease 2011   baseline cardiac eval 2011 with Dr. Sallyanne Kuster  . H/O echocardiogram 2015   Dr. Wynonia Lawman  . History of exercise stress test    04/2014 Dr. Wynonia Lawman, prior 2011 normal [place] treadmill stress test, Dr. Debara Pickett  . LOW BACK PAIN, ACUTE 09/26/2009  . RENAL CALCULUS, RIGHT 09/28/2009   9.27mm on CT  . TOBACCO USE, QUIT 09/26/2009  . Wears glasses    Past Surgical History:  Procedure Laterality Date  . COLONOSCOPY  08/2017   tubular adenoma, repeat 5 years; Dr. [place] Cellar  . FOOT SURGERY     bone spur, right; Dr. Milinda Pointer  . WISDOM TOOTH EXTRACTION     No Known Allergies No current facility-administered medications on file prior to encounter.   Current Outpatient Medications on File Prior to Encounter  Medication Sig Dispense Refill  . gabapentin (NEURONTIN) 100 MG capsule Take by mouth.    . pravastatin (PRAVACHOL) 20 MG tablet Take 1 tablet (20 mg total) by mouth every evening. (Patient not taking: Reported on 04/22/2019) 90 tablet 3  . sildenafil (VIAGRA) 100 MG tablet Take 1 tablet (100 mg total) by mouth daily as needed for erectile dysfunction. 10 tablet 2   Social History   Socioeconomic History  .  Marital status: Single    Spouse name: Not on file  . Number of children: Not on file  . Years of education: Not on file  . Highest education level: Not on file  Occupational History  . Not on file  Tobacco Use  . Smoking status: Former Smoker    Packs/day: 0.25    Years: 30.00    Pack years: 7.50    Quit date: 03/05/2009    Years since quitting: 10.7  . Smokeless tobacco: Never Used  . 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 Vaping Use: Never used  Substance and Sexual Activity  . Alcohol use: Yes    Alcohol/week: 4.0 standard drinks    Types: 4 Cans of beer per week    Comment: occ  . Drug use: No  . Sexual activity: Not on file  Other Topics Concern  . Not on file  Social History Narrative   Single, 2 children, sister staying with him currently, works in a warehouse, exercise -  Ellipitical, weight training, walking.  01/2019   Social Determinants of Health   Financial Resource Strain:   . Difficulty of Paying Living Expenses:   Food Insecurity:   . Worried About Charity fundraiser in the Last Year:   . [place] in the Last Year:  Transportation Needs:   . Film/video editor (Medical):   Marland Kitchen Lack of Transportation (Non-Medical):   Physical Activity:   . Days of Exercise per Week:   . Minutes of Exercise per Session:   Stress:   . Feeling of Stress :   Social Connections:   . Frequency of Communication with Friends and Family:   . Frequency of Social Gatherings with Friends and Family:   . Attends Religious Services:   . Active Member of Clubs or Organizations:   . Attends Archivist Meetings:   Marland Kitchen Marital Status:   Intimate Partner Violence:   . Fear of Current or Ex-Partner:   . Emotionally Abused:   Marland Kitchen Physically Abused:   . Sexually Abused:    Family History  Problem Relation Age of Onset  . Thyroid disease Mother   . Other Mother        brain disease, possibly dementia?  .  Gallstones Mother   . Other Father        spinal cord injury/accident  . Heart disease Father 26  . Heart disease Sister 12       artificial valve  . Colon cancer Sister   . Heart disease Brother 64       MI  . Diabetes Brother   . Pancreatic cancer Brother   . Cancer Sister 35       colon  . Cancer Sister        pancreas  . Pancreatic cancer Sister   . Heart disease Sister   . Heart disease Brother 25       pacemaker  . Cancer Brother        liver, formerly pancreas  . Heart disease Other        parent, other relative  . Colon cancer Other   . Stroke Neg Hx   . Hypertension Neg Hx   . Rectal cancer Neg Hx   . Stomach cancer Neg Hx     OBJECTIVE:  Vitals:   12/17/19 1506  Weight: 235 lb (106.6 kg)  Height: 6\' 5"  (1.956 m)     General appearance: alert; appears fatigued, but nontoxic; speaking in full sentences and tolerating own secretions HEENT: NCAT; Ears: EACs clear, TMs pearly gray; Eyes: PERRL.  EOM grossly intact. Sinuses: nontender; Nose: nares patent without rhinorrhea, Throat: oropharynx clear, tonsils non erythematous or enlarged, uvula midline  Neck: supple without LAD Lungs: unlabored respirations, symmetrical air entry; cough: mild; no respiratory distress; CTAB Heart: regular rate and rhythm.  Radial pulses 2+ symmetrical bilaterally Skin: warm and dry Psychological: alert and cooperative; normal mood and affect  LABS:  No results found for this or any previous visit (from the past 24 hour(s)).   ASSESSMENT & PLAN:  1. URI with cough and congestion   2. Encounter for screening for COVID-19     Meds ordered this encounter  Medications  . fluticasone (FLONASE) 50 MCG/ACT nasal spray    Sig: Place 1 spray into both nostrils daily for 14 days.    Dispense:  16 g    Refill:  0  . cetirizine (ZYRTEC ALLERGY) 10 MG tablet    Sig: Take 1 tablet (10 mg total) by mouth daily.    Dispense:  30 tablet    Refill:  0  . predniSONE (DELTASONE) 10 MG  tablet    Sig: Take 2 tablets (20 mg total) by mouth daily.    Dispense:  15 tablet    Refill:  0  . benzonatate (TESSALON) 100 MG capsule    Sig: Take 1 capsule (100 mg total) by mouth every 8 (eight) hours.    Dispense:  30 capsule    Refill:  0    Discharge instructions.    COVID testing ordered.  It will take between 2-7 days for test results.  Someone will contact you regarding abnormal results.    In the meantime: You should remain isolated in your home for 10 days from symptom onset AND greater than 24 hours after symptoms resolution (absence of fever without the use of fever-reducing medication and improvement in respiratory symptoms), whichever is longer Get plenty of rest and push fluids Tessalon Perles prescribed for cough Zyrtec for nasal congestion, runny nose, and/or sore throat Fonase for nasal congestion and runny nose Low-dose steroid was prescribed Use medications daily for symptom relief Use OTC medications like ibuprofen or tylenol as needed fever or pain Call or go to the ED if you have any new or worsening symptoms such as fever, worsening cough, shortness of breath, chest tightness, chest pain, turning blue, changes in mental status, etc...   Reviewed expectations re: course of current medical issues. Questions answered. Outlined signs and symptoms indicating need for more acute intervention. Patient verbalized understanding. After Visit Summary given.         Emerson Monte, [place] 12/17/19 1614

## 2019-12-18 ENCOUNTER — Ambulatory Visit: Payer: Self-pay

## 2019-12-18 LAB — SARS-COV-2, NAA 2 DAY TAT

## 2019-12-18 LAB — NOVEL CORONAVIRUS, NAA: SARS-CoV-2, NAA: DETECTED — AB

## 2020-01-21 ENCOUNTER — Telehealth: Payer: Self-pay | Admitting: Medical

## 2020-01-21 NOTE — Telephone Encounter (Signed)
P.A. SILDENAFIL  

## 2020-01-28 NOTE — Telephone Encounter (Signed)
P.A. approved left message for pt

## 2020-01-31 ENCOUNTER — Encounter: Payer: BC Managed Care – PPO | Admitting: Medical

## 2020-02-17 ENCOUNTER — Encounter: Payer: Self-pay | Admitting: Medical

## 2020-02-17 ENCOUNTER — Ambulatory Visit: Payer: BC Managed Care – PPO | Admitting: Medical

## 2020-02-17 ENCOUNTER — Other Ambulatory Visit: Payer: Self-pay

## 2020-02-17 VITALS — BP 138/82 | HR 70 | Ht 77.0 in | Wt 243.6 lb

## 2020-02-17 DIAGNOSIS — Z0000 Encounter for general adult medical examination without abnormal findings: Secondary | ICD-10-CM | POA: Diagnosis not present

## 2020-02-17 DIAGNOSIS — Z800 Family history of malignant neoplasm of digestive organs: Secondary | ICD-10-CM

## 2020-02-17 DIAGNOSIS — Z7185 Encounter for immunization safety counseling: Secondary | ICD-10-CM

## 2020-02-17 DIAGNOSIS — R0602 Shortness of breath: Secondary | ICD-10-CM

## 2020-02-17 DIAGNOSIS — I359 Nonrheumatic aortic valve disorder, unspecified: Secondary | ICD-10-CM

## 2020-02-17 DIAGNOSIS — K4090 Unilateral inguinal hernia, without obstruction or gangrene, not specified as recurrent: Secondary | ICD-10-CM

## 2020-02-17 DIAGNOSIS — L608 Other nail disorders: Secondary | ICD-10-CM

## 2020-02-17 DIAGNOSIS — N5201 Erectile dysfunction due to arterial insufficiency: Secondary | ICD-10-CM | POA: Diagnosis not present

## 2020-02-17 DIAGNOSIS — Z8249 Family history of ischemic heart disease and other diseases of the circulatory system: Secondary | ICD-10-CM | POA: Diagnosis not present

## 2020-02-17 DIAGNOSIS — G5791 Unspecified mononeuropathy of right lower limb: Secondary | ICD-10-CM | POA: Insufficient documentation

## 2020-02-17 DIAGNOSIS — Z23 Encounter for immunization: Secondary | ICD-10-CM | POA: Diagnosis not present

## 2020-02-17 DIAGNOSIS — Z8616 Personal history of COVID-19: Secondary | ICD-10-CM

## 2020-02-17 DIAGNOSIS — Z125 Encounter for screening for malignant neoplasm of prostate: Secondary | ICD-10-CM | POA: Insufficient documentation

## 2020-02-17 MED ORDER — TADALAFIL 20 MG PO TABS: 10.0000 mg | ORAL_TABLET | ORAL | 2 refills | Status: DC | PRN

## 2020-02-17 NOTE — Progress Notes (Deleted)
Complete physical exam   Patient: Mitchell Hughes   DOB: 1965/05/17   54 y.o. Male  MRN: [id number] Visit Date: 02/17/2020  Today's healthcare provider: Dorothea Ogle, PA-C   No chief complaint on file.  Subjective    Mitchell Hughes is a 54 y.o. male who presents today for a complete physical exam.  He reports consuming a general and well balanced  diet. Home exercise routine includes weight lifting at least 2 days a week . He generally feels well}. He reports sleeping well. He does have additional problems to discuss today. He has a mole on his back that itches a lot.  HPI  ***  Past Medical History:  Diagnosis Date  . Erectile dysfunction   . Family history of colon cancer    sister in 76s  . Family history of heart disease 2011   baseline cardiac eval 2011 with Dr. Sallyanne Kuster  . H/O echocardiogram 2015   Dr. Wynonia Lawman  . History of exercise stress test    04/2014 Dr. Wynonia Lawman, prior 2011 normal Bruce treadmill stress test, Dr. Debara Pickett  . LOW BACK PAIN, ACUTE 09/26/2009  . RENAL CALCULUS, RIGHT 09/28/2009   9.77mm on CT  . TOBACCO USE, QUIT 09/26/2009  . Wears glasses    Past Surgical History:  Procedure Laterality Date  . COLONOSCOPY  08/2017   tubular adenoma, repeat 5 years; Dr. [place] Cellar  . FOOT SURGERY     bone spur, right; Dr. Milinda Pointer  . WISDOM TOOTH EXTRACTION     Social History   Socioeconomic History  . Marital status: Single    Spouse name: Not on file  . Number of children: Not on file  . Years of education: Not on file  . Highest education level: Not on file  Occupational History  . Not on file  Tobacco Use  . Smoking status: Former Smoker    Packs/day: 0.25    Years: 30.00    Pack years: 7.50    Quit date: 03/05/2009    Years since quitting: 10.9  . Smokeless tobacco: Never Used  . 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 Vaping Use: Never used  Substance and Sexual Activity  . Alcohol use: Yes     Alcohol/week: 4.0 standard drinks    Types: 4 Cans of beer per week    Comment: occ  . Drug use: No  . Sexual activity: Not on file  Other Topics Concern  . Not on file  Social History Narrative   Single, 2 children, sister staying with him currently, works in a warehouse, exercise -  Ellipitical, weight training, walking.  01/2019   Social Determinants of Health   Financial Resource Strain:   . Difficulty of Paying Living Expenses: Not on file  Food Insecurity:   . Worried About Charity fundraiser in the Last Year: Not on file  . Ran Out of Food in the Last Year: Not on file  Transportation Needs:   . Lack of Transportation (Medical): Not on file  . Lack of Transportation (Non-Medical): Not on file  Physical Activity:   . Days of Exercise per Week: Not on file  . Minutes of Exercise per Session: Not on file  Stress:   . Feeling of Stress : Not on file  Social Connections:   . Frequency of Communication with Friends and Family: Not on file  . Frequency of Social Gatherings with Friends and Family: Not on  file  . Attends Religious Services: Not on file  . Active Member of Clubs or Organizations: Not on file  . Attends Archivist Meetings: Not on file  . Marital Status: Not on file  Intimate Partner Violence:   . Fear of Current or Ex-Partner: Not on file  . Emotionally Abused: Not on file  . Physically Abused: Not on file  . Sexually Abused: Not on file   Family Status  Relation Name Status  . Mother  Deceased at age 17       April 17, 2000  . Father  Deceased  . Sister  Alive       sister had colon cancer  . Brother  Alive  . Sister  Alive  . Sister  Alive  . Sister  Alive  . Sister adopted Alive  . Sister  Alive  . Brother  Deceased  . Other  (Not Specified)  . Neg Hx  (Not Specified)   Family History  Problem Relation Age of Onset  . Thyroid disease Mother   . Other Mother        brain disease, possibly dementia?  . Gallstones Mother   . Other Father         spinal cord injury/accident  . Heart disease Father 75  . Heart disease Sister 38       artificial valve  . Colon cancer Sister   . Heart disease Brother 82       MI  . Diabetes Brother   . Pancreatic cancer Brother   . Cancer Sister 65       colon  . Cancer Sister        pancreas  . Pancreatic cancer Sister   . Heart disease Sister   . Heart disease Brother 48       pacemaker  . Cancer Brother        liver, formerly pancreas  . Heart disease Other        parent, other relative  . Colon cancer Other   . Stroke Neg Hx   . Hypertension Neg Hx   . Rectal cancer Neg Hx   . Stomach cancer Neg Hx    No Known Allergies  Patient Care Team: Tysinger, Camelia Eng, PA-C as PCP - General (Family Medicine)   Medications: Outpatient Medications Prior to Visit  Medication Sig  . benzonatate (TESSALON) 100 MG capsule Take 1 capsule (100 mg total) by mouth every 8 (eight) hours.  . cetirizine (ZYRTEC ALLERGY) 10 MG tablet Take 1 tablet (10 mg total) by mouth daily.  . fluticasone (FLONASE) 50 MCG/ACT nasal spray Place 1 spray into both nostrils daily for 14 days.  Marland Kitchen gabapentin (NEURONTIN) 100 MG capsule Take by mouth.  . pravastatin (PRAVACHOL) 20 MG tablet Take 1 tablet (20 mg total) by mouth every evening. (Patient not taking: Reported on 04/22/2019)  . predniSONE (DELTASONE) 10 MG tablet Take 2 tablets (20 mg total) by mouth daily.  . sildenafil (VIAGRA) 100 MG tablet Take 1 tablet (100 mg total) by mouth daily as needed for erectile dysfunction.   No facility-administered medications prior to visit.    Review of Systems  {Heme  Chem  Endocrine  Serology  Results Review (optional):23779::" "}  Objective    There were no vitals taken for this visit. {Show previous vital signs (optional):23777::" "}  Physical Exam  ***  Last depression screening scores PHQ 2/9 Scores 01/28/2019 06/22/2017  PHQ - 2 Score 0 0   Last  fall risk screening Fall Risk  01/28/2019  Falls in  the past year? 0   Last Audit-C alcohol use screening No flowsheet data found. A score of 3 or more in women, and 4 or more in men indicates increased risk for alcohol abuse, EXCEPT if all of the points are from question 1   No results found for any visits on 02/17/20.  Assessment & Plan    Routine Health Maintenance and Physical Exam  Exercise Activities and Dietary recommendations Goals   None     Immunization History  Administered Date(s) Administered  . Influenza,inj,Quad PF,6+ Mos 12/21/2013  . Influenza-Unspecified 03/16/2015, 03/05/2016, 02/02/2017  . Tdap 12/21/2013    Health Maintenance  Topic Date Due  . COVID-19 Vaccine (1) Never done  . HIV Screening  Never done  . INFLUENZA VACCINE  12/04/2019  . COLONOSCOPY  08/03/2020  . TETANUS/TDAP  12/22/2023  . Hepatitis C Screening  Completed    Discussed health benefits of physical activity, and encouraged him to engage in regular exercise appropriate for his age and condition.  ***  No follow-ups on file.     {provider attestation***:1}   Dorothea Ogle, Hershal Coria  [place] [phone] (phone) [phone] (fax)  [place]

## 2020-02-17 NOTE — Progress Notes (Signed)
[facility] Subjective:   HPI  Mitchell Hughes is a 54 y.o. male who presents for Chief Complaint  Patient presents with  . Annual Exam    with fasting labs   . Nevus    on back that itches      Patient Care Team: Lalonnie Shaffer, Leward Quan as PCP - General (Family Medicine) Sees dentist Sees eye doctor Dr. Jean Rosenthal, ortho Dr. Erline Hau,  surgery Dr. Doyle Cellar, gastroenterology    Concerns: Had covid in August.  Wasn't too bad of illness.   Is fully vaccinated.   Since then has had some shortness of breath.   No hx/o asthma.  Former smoker.  No hemoptysis.   Sometimes has some wheezing.  No chest pain, no DOE.    He notes ongoing numbness, nerve damage in right foot.   Had prior surgery in that foot.  Sometimes toenail looks black.  The toenail discoloration tends to wax and wane depending on when he has pain.  Reviewed their medical, surgical, family, social, medication, and allergy history and updated chart as appropriate.  Past Medical History:  Diagnosis Date  . Erectile dysfunction   . Family history of colon cancer    sister in 72s  . Family history of heart disease 2011   baseline cardiac eval 2011 with Dr. Sallyanne Kuster  . H/O echocardiogram 2015   Dr. Wynonia Lawman  . History of exercise stress test    04/2014 Dr. Wynonia Lawman, prior 2011 normal Bruce treadmill stress test, Dr. Debara Pickett  . LOW BACK PAIN, ACUTE 09/26/2009  . RENAL CALCULUS, RIGHT 09/28/2009   9.39mm on CT  . TOBACCO USE, QUIT 09/26/2009  . Wears glasses     Past Surgical History:  Procedure Laterality Date  . COLONOSCOPY  08/2017   tubular adenoma, repeat 5 years; Dr. [place] Cellar  . FOOT SURGERY     bone spur, right; Dr. Milinda Pointer  . WISDOM TOOTH EXTRACTION      Family History  Problem Relation Age of Onset  . Thyroid disease Mother   . Other Mother        brain disease, possibly dementia?  . Gallstones Mother   . Other Father        spinal cord injury/accident  . Heart disease Father 53   . Heart disease Sister 54       artificial valve  . Colon cancer Sister   . Heart disease Brother 25       MI  . Diabetes Brother   . Pancreatic cancer Brother   . Cancer Sister 89       colon  . Cancer Sister        pancreas  . Pancreatic cancer Sister   . Heart disease Sister   . Heart disease Brother 61       pacemaker  . Cancer Brother        liver, formerly pancreas  . Heart disease Other        parent, other relative  . Colon cancer Other   . Stroke Neg Hx   . Hypertension Neg Hx   . Rectal cancer Neg Hx   . Stomach cancer Neg Hx      Current Outpatient Medications:  .  sildenafil (VIAGRA) 100 MG tablet, Take 1 tablet (100 mg total) by mouth daily as needed for erectile dysfunction., Disp: 10 tablet, Rfl: 2 .  benzonatate (TESSALON) 100 MG capsule, Take 1 capsule (100 mg total) by mouth every 8 (eight) hours. (Patient  not taking: Reported on 02/17/2020), Disp: 30 capsule, Rfl: 0 .  cetirizine (ZYRTEC ALLERGY) 10 MG tablet, Take 1 tablet (10 mg total) by mouth daily., Disp: 30 tablet, Rfl: 0 .  fluticasone (FLONASE) 50 MCG/ACT nasal spray, Place 1 spray into both nostrils daily for 14 days., Disp: 16 g, Rfl: 0 .  gabapentin (NEURONTIN) 100 MG capsule, Take by mouth., Disp: , Rfl:  .  pravastatin (PRAVACHOL) 20 MG tablet, Take 1 tablet (20 mg total) by mouth every evening. (Patient not taking: Reported on 04/22/2019), Disp: 90 tablet, Rfl: 3 .  predniSONE (DELTASONE) 10 MG tablet, Take 2 tablets (20 mg total) by mouth daily. (Patient not taking: Reported on 02/17/2020), Disp: 15 tablet, Rfl: 0 .  tadalafil (CIALIS) 20 MG tablet, Take 0.5-1 tablets (10-20 mg total) by mouth every other day as needed for erectile dysfunction., Disp: 10 tablet, Rfl: 2  No Known Allergies     Review of Systems Constitutional: -fever, -chills, -sweats, -unexpected weight change, -decreased appetite, -fatigue Allergy: -sneezing, -itching, -congestion Dermatology: -changing moles, +rash,  -lumps ENT: -runny nose, -ear pain, -sore throat, -hoarseness, -sinus pain, -teeth pain, - ringing in ears, -hearing loss, -nosebleeds Cardiology: -chest pain, -palpitations, -swelling, -difficulty breathing when lying flat, -waking up short of breath Respiratory: -cough, +shortness of breath, -difficulty breathing with exercise or exertion, -wheezing, -coughing up blood Gastroenterology: -abdominal pain, -nausea, -vomiting, -diarrhea, -constipation, -blood in stool, -changes in bowel movement, -difficulty swallowing or eating Hematology: -bleeding, -bruising  Musculoskeletal: -joint aches, -muscle aches, -joint swelling, -back pain, -neck pain, -cramping, -changes in gait Ophthalmology: denies vision changes, eye redness, itching, discharge Urology: -burning with urination, -difficulty urinating, -blood in urine, -urinary frequency, -urgency, -incontinence Neurology: -headache, -weakness, -tingling, +numbness, -memory loss, -falls, -dizziness Psychology: -depressed mood, -agitation, -sleep problems Male GU: no testicular mass, pain, no lymph nodes swollen, no swelling, no rash.     Objective:  BP 138/82   Pulse 70   Ht 6\' 5"  (1.956 m)   Wt 243 lb 9.6 oz (110.5 kg)   SpO2 96%   BMI 28.89 kg/m   BP Readings from Last 3 Encounters:  02/17/20 138/82  04/22/19 (!) 142/80  01/28/19 118/80   Wt Readings from Last 3 Encounters:  02/17/20 243 lb 9.6 oz (110.5 kg)  12/17/19 235 lb (106.6 kg)  04/22/19 251 lb 6.4 oz (114 kg)    General appearance: alert, no distress, WD/WN, African American male Skin: Right great toe with partial brownish-black coloration of the nail, second toe completely brownish-black coloration and thickened nail, rest of toenails unremarkable, no other worrisome skin lesions HEENT: normocephalic, conjunctiva/corneas normal, sclerae anicteric, PERRLA, EOMi, nares patent, no discharge or erythema, pharynx normal Oral cavity: MMM, tongue normal, teeth in good  repair Neck: supple, no lymphadenopathy, no thyromegaly, no masses, normal ROM, no bruits Chest: non tender, normal shape and expansion Heart: RRR, normal S1, S2, no murmurs Lungs: CTA bilaterally, no wheezes, rhonchi, or rales Abdomen: +bs, soft, non tender, non distended, no masses, no hepatomegaly, no splenomegaly, no bruits Back: non tender, normal ROM, no scoliosis Musculoskeletal: Surgical scars noted laterally of right foot dorsally, upper extremities non tender, no obvious deformity, normal ROM throughout, lower extremities non tender, no obvious deformity, normal ROM throughout Extremities: no edema, no cyanosis, no clubbing Pulses: 2+ symmetric, upper and lower extremities, normal cap refill Neurological: alert, oriented x 3, CN2-12 intact, strength normal upper extremities and lower extremities, sensation normal throughout, DTRs 2+ throughout, no cerebellar signs, gait normal Psychiatric: normal affect,  behavior normal, pleasant  GU: normal male external genitalia, nontender, no masses, right inguinal hernia, reducible, no other hernia, no lymphadenopathy Rectal: anus normal tone, prostate with normal limits no nodules;  Echocardiogram 02/2019 1. Left ventricular ejection fraction, by visual estimation, is 60 to 65%. The left ventricle has normal function. Normal left ventricular size. There is no left ventricular hypertrophy. 2. Global right ventricle has normal systolic function.The right ventricular size is normal. No increase in right ventricular wall thickness. 3. Left atrial size was normal. 4. Right atrial size was normal. 5. The mitral valve is normal in structure. No evidence of mitral valve regurgitation. No evidence of mitral stenosis. 6. The tricuspid valve is normal in structure. Tricuspid valve regurgitation was not visualized by color flow Doppler. 7. The aortic valve was not well visualized Aortic valve regurgitation is mild by color flow Doppler. Mild aortic  valve stenosis. 8. Not well visualized but likely tri leaflet Moderate calcification and restricted leaflet motion. 9. The pulmonic valve was normal in structure. Pulmonic valve regurgitation is not visualized by color flow Doppler. 10. Normal pulmonary artery systolic pressure. 11. The inferior vena cava is normal in size with greater than 50% respiratory variability, suggesting right atrial pressure of 3 mmHg.   Assessment and Plan :   Encounter Diagnoses  Name Primary?  . Encounter for health maintenance examination in adult Yes  . Erectile dysfunction due to arterial insufficiency   . Aortic valve calcification   . Family history of heart disease   . Family history of colon cancer   . Vaccine counseling   . Screening for prostate cancer   . History of COVID-19   . SOB (shortness of breath)   . Toenail deformity   . Neuropathy of foot, right   . Right inguinal hernia     Physical exam - discussed and counseled on healthy lifestyle, diet, exercise, preventative care, vaccinations, sick and well care, proper use of emergency dept and after hours care, and addressed their concerns.    Health screening: See your eye doctor yearly for routine vision care. See your dentist yearly for routine dental care including hygiene visits twice yearly.  Cancer screening Colonoscopy:  Reviewed colonoscopy on file that is up to date.  Given your family history of colon cancer, follow the recommendations of your gastroenterologist on repeating colonoscopy.  He lost 2 siblings last year due to colon cancer, both in their 34s.    I recommend routine prostate cancer screening which includes PSA blood test and prostate exam.  Check your skin regularly for new or changing moles   Vaccines: I recommend a yearly flu shot Counseled on the influenza virus vaccine.  Vaccine information sheet given.  Influenza vaccine given after consent obtained.  Up to date on the Covid vaccine  I recommend  pneumococcal pneumonia vaccine if you smoke  Shingles vaccine:  I recommend you have a shingles vaccine to help prevent shingles or herpes zoster outbreak.   Please call your insurer to inquire about coverage for the Shingrix vaccine given in 2 doses.   Some insurers cover this vaccine after age 29, some cover this after age 6.  If your insurer covers this, then call to schedule appointment to have this vaccine here.  Up to date on Tetanus vaccine  Separate significant issues discussed: Aortic valve calcification, family history of heart disease-I reviewed the echocardiogram from October 2020 and last cardiology notes in the chart record  Family history of colon cancer-reviewed colonoscopy on  file up-to-date  Prostate cancer screening today  Shortness of breath, history of COVID-19-consider combination steroid LABA, consider PFT, lungs clear, consider chest x-ray  Toenail deformity, neuropathy of foot-I sent an electronic E consult for specialty provider review through [facility] on their behalf regarding concern.  I will get back in touch with patient pending recommendations from the [place] [facility] consult.  Right inguinal hernia-mild to moderate, no symptoms.  Continue to monitor  Rectal dysfunction-begin trial of Cialis to see if it works better than his experience with sildenafil.  Discussed risk and benefits of medicine, proper use of medicine  Gaylord was seen today for annual exam and nevus.  Diagnoses and all orders for this visit:  Encounter for health maintenance examination in adult -     CBC with Differential/Platelet -     Comprehensive metabolic panel -     Hemoglobin A1c -     Lipid panel -     PSA  Erectile dysfunction due to arterial insufficiency  Aortic valve calcification  Family history of heart disease  Family history of colon cancer  Vaccine counseling  Screening for prostate cancer -     PSA  History of COVID-19  SOB (shortness of breath)  Toenail  deformity  Neuropathy of foot, right  Right inguinal hernia  Other orders -     Flu Vaccine QUAD 6+ mos PF IM (Fluarix Quad PF) -     tadalafil (CIALIS) 20 MG tablet; Take 0.5-1 tablets (10-20 mg total) by mouth every other day as needed for erectile dysfunction.    Follow-up pending labs, yearly for physical

## 2020-02-18 LAB — COMPREHENSIVE METABOLIC PANEL
ALT: 38 IU/L (ref 0–44)
AST: 32 IU/L (ref 0–40)
Albumin/Globulin Ratio: 1.7 (ref 1.2–2.2)
Albumin: 4.6 g/dL (ref 3.8–4.9)
Alkaline Phosphatase: 60 IU/L (ref 44–121)
BUN/Creatinine Ratio: 11 (ref 9–20)
BUN: 13 mg/dL (ref 6–24)
Bilirubin Total: 0.5 mg/dL (ref 0.0–1.2)
CO2: 23 mmol/L (ref 20–29)
Calcium: 9.7 mg/dL (ref 8.7–10.2)
Chloride: 103 mmol/L (ref 96–106)
Creatinine, Ser: 1.17 mg/dL (ref 0.76–1.27)
GFR calc Af Amer: 80 mL/min/{1.73_m2} (ref >59)
GFR calc non Af Amer: 69 mL/min/{1.73_m2} (ref >59)
Globulin, Total: 2.7 g/dL (ref 1.5–4.5)
Glucose: 98 mg/dL (ref 65–99)
Potassium: 4.5 mmol/L (ref 3.5–5.2)
Sodium: 140 mmol/L (ref 134–144)
Total Protein: 7.3 g/dL (ref 6.0–8.5)

## 2020-02-18 LAB — CBC WITH DIFFERENTIAL/PLATELET
Basophils Absolute: 0.1 10*3/uL (ref 0.0–0.2)
Basos: 1 %
EOS (ABSOLUTE): 0.2 10*3/uL (ref 0.0–0.4)
Eos: 4 %
Hematocrit: 46 % (ref 37.5–51.0)
Hemoglobin: 15.5 g/dL (ref 13.0–17.7)
Immature Grans (Abs): 0 10*3/uL (ref 0.0–0.1)
Immature Granulocytes: 0 %
Lymphocytes Absolute: 1.3 10*3/uL (ref 0.7–3.1)
Lymphs: 23 %
MCH: 30 pg (ref 26.6–33.0)
MCHC: 33.7 g/dL (ref 31.5–35.7)
MCV: 89 fL (ref 79–97)
Monocytes Absolute: 0.4 10*3/uL (ref 0.1–0.9)
Monocytes: 7 %
Neutrophils Absolute: 3.6 10*3/uL (ref 1.4–7.0)
Neutrophils: 65 %
Platelets: 228 10*3/uL (ref 150–450)
RBC: 5.17 x10E6/uL (ref 4.14–5.80)
RDW: 13.1 % (ref 11.6–15.4)
WBC: 5.5 10*3/uL (ref 3.4–10.8)

## 2020-02-18 LAB — LIPID PANEL
Chol/HDL Ratio: 4 ratio (ref 0.0–5.0)
Cholesterol, Total: 173 mg/dL (ref 100–199)
HDL: 43 mg/dL (ref >39)
LDL Chol Calc (NIH): 104 mg/dL — ABNORMAL HIGH (ref 0–99)
Triglycerides: 145 mg/dL (ref 0–149)
VLDL Cholesterol Cal: 26 mg/dL (ref 5–40)

## 2020-02-18 LAB — HEMOGLOBIN A1C
Est. average glucose Bld gHb Est-mCnc: 117 mg/dL
Hgb A1c MFr Bld: 5.7 % — ABNORMAL HIGH (ref 4.8–5.6)

## 2020-02-18 LAB — PSA: Prostate Specific Ag, Serum: 1.3 ng/mL (ref 0.0–4.0)

## 2020-02-20 ENCOUNTER — Other Ambulatory Visit: Payer: Self-pay | Admitting: Medical

## 2020-02-20 MED ORDER — PRAVASTATIN SODIUM 20 MG PO TABS: 20.0000 mg | ORAL_TABLET | Freq: Every evening | ORAL | 3 refills | Status: DC

## 2020-02-20 MED ORDER — FLUTICASONE FUROATE-VILANTEROL 100-25 MCG/INH IN AEPB: 1.0000 | INHALATION_SPRAY | Freq: Every day | RESPIRATORY_TRACT | 0 refills | Status: DC

## 2020-02-20 MED ORDER — TADALAFIL 20 MG PO TABS: 10.0000 mg | ORAL_TABLET | ORAL | 2 refills | Status: DC | PRN

## 2020-02-29 ENCOUNTER — Telehealth: Payer: Self-pay

## 2020-02-29 NOTE — Telephone Encounter (Signed)
P.A. TADALAFIL 

## 2020-03-05 NOTE — Telephone Encounter (Signed)
P.A. denied only covered for PAH, called pt informed, looked up Good Rx and cheapest price is at [facility], called pt and he is ok with switching there.  Called [facility] and cancelled Rx and called Rx into [facility] should be $13 verses $164 at [facility]

## 2020-03-23 ENCOUNTER — Other Ambulatory Visit: Payer: Self-pay | Admitting: Medical

## 2020-03-23 NOTE — Telephone Encounter (Signed)
Pt. Requesting refill on his breo last apt was 02/17/20 and next apt is 02/21/21.

## 2020-03-26 ENCOUNTER — Other Ambulatory Visit: Payer: Self-pay | Admitting: Medical

## 2020-03-26 ENCOUNTER — Telehealth: Payer: Self-pay

## 2020-03-26 MED ORDER — ANORO ELLIPTA 62.5-25 MCG/INH IN AEPB: 1.0000 | INHALATION_SPRAY | Freq: Every day | RESPIRATORY_TRACT | 2 refills | Status: DC

## 2020-03-26 NOTE — Telephone Encounter (Signed)
Recv'd P.A. for [facility] not covering, preferred meds are Trelegy, Anoro, Flovent, Advair, Incruse, Arnuity, Serevent & Atrovent. Can pt be switched to one of these?

## 2020-03-26 NOTE — Telephone Encounter (Signed)
I sent Anoro as alternative due to insurance rejecting to cover Breo.  Lets see if he has good response with this.  If too expensive, let me know

## 2020-03-26 NOTE — Progress Notes (Signed)
anoro

## 2020-04-01 NOTE — Telephone Encounter (Signed)
Mitchell Hughes handled med change

## 2020-04-02 NOTE — Telephone Encounter (Signed)
[place] went thru for $30, pt informed

## 2020-05-11 ENCOUNTER — Other Ambulatory Visit: Payer: BC Managed Care – PPO

## 2020-05-11 ENCOUNTER — Other Ambulatory Visit: Payer: Self-pay

## 2020-05-11 DIAGNOSIS — Z20822 Contact with and (suspected) exposure to covid-19: Secondary | ICD-10-CM

## 2020-05-15 LAB — NOVEL CORONAVIRUS, NAA: SARS-CoV-2, NAA: NOT DETECTED

## 2020-05-21 ENCOUNTER — Ambulatory Visit: Payer: BC Managed Care – PPO | Admitting: Physician Assistant

## 2020-05-28 ENCOUNTER — Encounter: Payer: Self-pay | Admitting: Physician Assistant

## 2020-05-28 ENCOUNTER — Ambulatory Visit (INDEPENDENT_AMBULATORY_CARE_PROVIDER_SITE_OTHER): Payer: BC Managed Care – PPO | Admitting: Physician Assistant

## 2020-05-28 DIAGNOSIS — G8929 Other chronic pain: Secondary | ICD-10-CM

## 2020-05-28 DIAGNOSIS — M25512 Pain in left shoulder: Secondary | ICD-10-CM

## 2020-05-28 DIAGNOSIS — M19049 Primary osteoarthritis, unspecified hand: Secondary | ICD-10-CM

## 2020-05-28 NOTE — Addendum Note (Signed)
Addended by: Robyne Peers on: 05/28/2020 04:29 PM   Modules accepted: Orders

## 2020-05-28 NOTE — Progress Notes (Signed)
Office Visit Note   Patient: Mitchell Hughes           Date of Birth: 18-Jul-1965           MRN: [id number] Visit Date: 05/28/2020              Requested by: Carlena Hurl, PA-C [address] [place],  [place] [address] PCP: Carlena Hurl, PA-C   Assessment & Plan: Visit Diagnoses:  1. Chronic left shoulder pain   2. CMC arthritis     Plan: Due to the fact patient continues to have pain in his thumb and the last injection was short-lived we will refer him to Dr. Erlinda Hong for evaluation for possible surgical intervention.  In regards to his left shoulder he has had at least 2 injections in the shoulder and has diminished range of motion positive impingement recommend MRI to evaluate for rotator cuff tear.  Have him follow-up after the MRI to go over the results discuss further treatment.  Follow-up of the MRI can be with Dr. Erlinda Hong.  Follow-Up Instructions: Return Dr. Erlinda Hong after MRI.   Orders:  No orders of the defined types were placed in this encounter.  No orders of the defined types were placed in this encounter.     Procedures: No procedures performed   Clinical Data: No additional findings.   Subjective: Chief Complaint  Patient presents with  . Left Thumb - Pain    HPI Mitchell Hughes comes in today with left thumb pain.  We saw him last in January 2021 and he was given a cortisone injection for the [facility] joint left thumb.  He states that the thumb injection last January only getting relief for 1 month.  He is also seen for bilateral shoulder pain at that time and was sent to therapy.  He has had at least 2 injections in the shoulder for the chronic shoulder pain.  He denies any numbness tingling down the left arm.  Does state that the shoulder injection lasted until recently.  He has had no new injuries. Review of Systems Negative for fevers or chills.  Objective: Vital Signs: There were no vitals taken for this visit.  Physical Exam Constitutional:      Appearance: He  is not ill-appearing or diaphoretic.  Pulmonary:     Effort: Pulmonary effort is normal.  Neurological:     Mental Status: He is alert and oriented to person, place, and time.  Psychiatric:        Mood and Affect: Mood normal.     Ortho Exam Left hand: Left thumb positive grind test with palpable crepitus.  Tenderness over the left thumb CMC joint.  Radial pulses 2+. Bilateral shoulders: 5 out of 5 strength with external and internal rotation against resistance.  Empty can test is negative bilaterally.  Liftoff test negative bilaterally.  Positive impingement on the left.  He has difficulty raising the left arm over his head.  He has full forward flexion of the right shoulder without pain. Specialty Comments:  No specialty comments available.  Imaging: No results found.   PMFS History: Patient Active Problem List   Diagnosis Date Noted  . Encounter for health maintenance examination in adult 02/17/2020  . Screening for prostate cancer 02/17/2020  . History of COVID-19 02/17/2020  . SOB (shortness of breath) 02/17/2020  . Toenail deformity 02/17/2020  . Neuropathy of foot, right 02/17/2020  . Chronic pain of both shoulders 04/22/2019  . Decreased range of motion of  shoulder 04/22/2019  . Degenerative arthritis of thumb, left 04/22/2019  . Right inguinal hernia 01/28/2019  . Aortic valve calcification 01/28/2019  . Vaccine counseling 06/22/2017  . Erectile dysfunction due to arterial insufficiency 12/21/2013  . Family history of heart disease 12/21/2013  . Family history of colon cancer 12/21/2013   Past Medical History:  Diagnosis Date  . Erectile dysfunction   . Family history of colon cancer    sister in 55s  . Family history of heart disease 2011   baseline cardiac eval 2011 with Dr. Sallyanne Kuster  . H/O echocardiogram 2015   Dr. Wynonia Lawman  . History of exercise stress test    04/2014 Dr. Wynonia Lawman, prior 2011 normal Bruce treadmill stress test, Dr. Debara Pickett  . LOW BACK PAIN,  ACUTE 09/26/2009  . RENAL CALCULUS, RIGHT 09/28/2009   9.51mm on CT  . TOBACCO USE, QUIT 09/26/2009  . Wears glasses     Family History  Problem Relation Age of Onset  . Thyroid disease Mother   . Other Mother        brain disease, possibly dementia?  . Gallstones Mother   . Other Father        spinal cord injury/accident  . Heart disease Father 64  . Heart disease Sister 54       artificial valve  . Colon cancer Sister   . Heart disease Brother 97       MI  . Diabetes Brother   . Pancreatic cancer Brother   . Cancer Sister 17       colon  . Cancer Sister        pancreas  . Pancreatic cancer Sister   . Heart disease Sister   . Heart disease Brother 21       pacemaker  . Cancer Brother        liver, formerly pancreas  . Heart disease Other        parent, other relative  . Colon cancer Other   . Stroke Neg Hx   . Hypertension Neg Hx   . Rectal cancer Neg Hx   . Stomach cancer Neg Hx     Past Surgical History:  Procedure Laterality Date  . COLONOSCOPY  08/2017   tubular adenoma, repeat 5 years; Dr. [place] Cellar  . FOOT SURGERY     bone spur, right; Dr. Milinda Pointer  . WISDOM TOOTH EXTRACTION     Social History   Occupational History  . Not on file  Tobacco Use  . Smoking status: Former Smoker    Packs/day: 0.25    Years: 30.00    Pack years: 7.50    Quit date: 03/05/2009    Years since quitting: 11.2  . Smokeless tobacco: Never Used  . 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 Vaping Use: Never used  Substance and Sexual Activity  . Alcohol use: Yes    Alcohol/week: 4.0 standard drinks    Types: 4 Cans of beer per week    Comment: occ  . Drug use: No  . Sexual activity: Not on file

## 2020-06-14 ENCOUNTER — Other Ambulatory Visit: Payer: BC Managed Care – PPO

## 2020-06-15 ENCOUNTER — Other Ambulatory Visit: Payer: Self-pay

## 2020-06-15 ENCOUNTER — Ambulatory Visit
Admission: RE | Admit: 2020-06-15 | Discharge: 2020-06-15 | Disposition: A | Discharge status: Home / Self Care | Payer: BC Managed Care – PPO | Source: Ambulatory Visit | Attending: Physician Assistant | Admitting: Physician Assistant

## 2020-06-15 DIAGNOSIS — M25512 Pain in left shoulder: Secondary | ICD-10-CM

## 2020-06-15 DIAGNOSIS — G8929 Other chronic pain: Secondary | ICD-10-CM

## 2020-06-20 ENCOUNTER — Ambulatory Visit (INDEPENDENT_AMBULATORY_CARE_PROVIDER_SITE_OTHER): Payer: BC Managed Care – PPO | Admitting: Orthopaedic Surgery

## 2020-06-20 ENCOUNTER — Encounter: Payer: Self-pay | Admitting: Orthopaedic Surgery

## 2020-06-20 DIAGNOSIS — M7542 Impingement syndrome of left shoulder: Secondary | ICD-10-CM

## 2020-06-20 DIAGNOSIS — M1812 Unilateral primary osteoarthritis of first carpometacarpal joint, left hand: Secondary | ICD-10-CM | POA: Insufficient documentation

## 2020-06-20 DIAGNOSIS — M75102 Unspecified rotator cuff tear or rupture of left shoulder, not specified as traumatic: Secondary | ICD-10-CM | POA: Insufficient documentation

## 2020-06-20 MED ORDER — METHYLPREDNISOLONE ACETATE 40 MG/ML IJ SUSP
13.3300 mg | INTRAMUSCULAR | Status: AC | PRN
  Administered 2020-06-20: 13.33 mg

## 2020-06-20 MED ORDER — BUPIVACAINE HCL 0.5 % IJ SOLN
0.3300 mL | INTRAMUSCULAR | Status: AC | PRN
  Administered 2020-06-20: .33 mL

## 2020-06-20 MED ORDER — LIDOCAINE HCL 1 % IJ SOLN
0.3000 mL | INTRAMUSCULAR | Status: AC | PRN
  Administered 2020-06-20: .3 mL

## 2020-06-20 NOTE — Progress Notes (Signed)
Office Visit Note   Patient: Mitchell Hughes           Date of Birth: 07-01-1965           MRN: [id number] Visit Date: 06/20/2020              Requested by: Carlena Hurl, PA-C [address] [place],  [place] [address] PCP: Carlena Hurl, PA-C   Assessment & Plan: Visit Diagnoses:  1. Tear of left supraspinatus tendon   2. Impingement syndrome of left shoulder   3. Primary osteoarthritis of first carpometacarpal joint of left hand     Plan: MRI of the left shoulder shows a full-thickness retracted tear of the supraspinatus of approximately 8/2 mm.  There is tendinopathy of the infraspinatus and subscapularis.  Type II acromion.  Degenerative labral tears.  These findings reviewed with the patient and my recommendation is for arthroscopic rotator cuff repair, decompression, biceps tenodesis versus tenotomy.  For the left thumb he would like to try another injection.  He will continue to use the brace and is on Voltaren gel.  He just recently started a new job and will need to wait until the health insurance kicks in shoulder surgery.  He will call us back ready to schedule.  Risk benefits rehab recovery surgery reviewed with the patient detail today.  Follow-Up Instructions: Return if symptoms worsen or fail to improve.   Orders:  No orders of the defined types were placed in this encounter.  No orders of the defined types were placed in this encounter.     Procedures: Hand/UE Inj: L thumb CMC for osteoarthritis on 06/20/2020 8:58 AM Indications: pain Details: 25 G needle Medications: 0.3 mL lidocaine 1 %; 0.33 mL bupivacaine 0.5 %; 13.33 mg methylPREDNISolone acetate 40 MG/ML Outcome: tolerated well, no immediate complications      Clinical Data: No additional findings.   Subjective: Chief Complaint  Patient presents with  . Left Thumb - Pain  . Left Shoulder - Pain    Mr. Trapani is follow-up of for a recent left shoulder MRI as well as left thumb basal joint  arthritis.  Terms of the left shoulder he has had a couple of shoulder injections with temporary relief.  He recently had an MRI.  For the left thumb he has pain with grasping and pinching he has had 1 injection a year ago that was short-lived but he would like to try another one.   Review of Systems   Objective: Vital Signs: There were no vitals taken for this visit.  Physical Exam  Ortho Exam Left shoulder shows pain with active range of motion beyond 100 degrees of flexion.  Passive range of motion intact with moderate pain.  Positive empty can.  Negative drop arm.  Left thumb shows positive grind test.  Negative Finkelstein's.  There is no triggering.  Specialty Comments:  No specialty comments available.  Imaging: No results found.   PMFS History: Patient Active Problem List   Diagnosis Date Noted  . Tear of left supraspinatus tendon 06/20/2020  . Impingement syndrome of left shoulder 06/20/2020  . Primary osteoarthritis of first carpometacarpal joint of left hand 06/20/2020  . Encounter for health maintenance examination in adult 02/17/2020  . Screening for prostate cancer 02/17/2020  . History of COVID-19 02/17/2020  . SOB (shortness of breath) 02/17/2020  . Toenail deformity 02/17/2020  . Neuropathy of foot, right 02/17/2020  . Chronic pain of both shoulders 04/22/2019  . Decreased range of  motion of shoulder 04/22/2019  . Degenerative arthritis of thumb, left 04/22/2019  . Right inguinal hernia 01/28/2019  . Aortic valve calcification 01/28/2019  . Vaccine counseling 06/22/2017  . Erectile dysfunction due to arterial insufficiency 12/21/2013  . Family history of heart disease 12/21/2013  . Family history of colon cancer 12/21/2013   Past Medical History:  Diagnosis Date  . Erectile dysfunction   . Family history of colon cancer    sister in 41s  . Family history of heart disease 2011   baseline cardiac eval 2011 with Dr. Sallyanne Kuster  . H/O echocardiogram 2015    Dr. Wynonia Lawman  . History of exercise stress test    04/2014 Dr. Wynonia Lawman, prior 2011 normal Bruce treadmill stress test, Dr. Debara Pickett  . LOW BACK PAIN, ACUTE 09/26/2009  . RENAL CALCULUS, RIGHT 09/28/2009   9.79mm on CT  . TOBACCO USE, QUIT 09/26/2009  . Wears glasses     Family History  Problem Relation Age of Onset  . Thyroid disease Mother   . Other Mother        brain disease, possibly dementia?  . Gallstones Mother   . Other Father        spinal cord injury/accident  . Heart disease Father 27  . Heart disease Sister 84       artificial valve  . Colon cancer Sister   . Heart disease Brother 51       MI  . Diabetes Brother   . Pancreatic cancer Brother   . Cancer Sister 22       colon  . Cancer Sister        pancreas  . Pancreatic cancer Sister   . Heart disease Sister   . Heart disease Brother 64       pacemaker  . Cancer Brother        liver, formerly pancreas  . Heart disease Other        parent, other relative  . Colon cancer Other   . Stroke Neg Hx   . Hypertension Neg Hx   . Rectal cancer Neg Hx   . Stomach cancer Neg Hx     Past Surgical History:  Procedure Laterality Date  . COLONOSCOPY  08/2017   tubular adenoma, repeat 5 years; Dr. [place] Cellar  . FOOT SURGERY     bone spur, right; Dr. Milinda Pointer  . WISDOM TOOTH EXTRACTION     Social History   Occupational History  . Not on file  Tobacco Use  . Smoking status: Former Smoker    Packs/day: 0.25    Years: 30.00    Pack years: 7.50    Quit date: 03/05/2009    Years since quitting: 11.3  . Smokeless tobacco: Never Used  . 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 Vaping Use: Never used  Substance and Sexual Activity  . Alcohol use: Yes    Alcohol/week: 4.0 standard drinks    Types: 4 Cans of beer per week    Comment: occ  . Drug use: No  . Sexual activity: Not on file

## 2020-07-02 ENCOUNTER — Other Ambulatory Visit: Payer: Self-pay | Admitting: Medical

## 2020-07-02 NOTE — Telephone Encounter (Signed)
Is this okay to refill? 

## 2020-08-30 ENCOUNTER — Encounter: Payer: Self-pay | Admitting: Gastroenterology

## 2021-02-14 ENCOUNTER — Other Ambulatory Visit: Payer: Self-pay

## 2021-02-14 ENCOUNTER — Ambulatory Visit: Payer: BC Managed Care – PPO | Admitting: Medical

## 2021-02-14 VITALS — BP 130/80 | HR 52 | Wt 228.2 lb

## 2021-02-14 DIAGNOSIS — Z23 Encounter for immunization: Secondary | ICD-10-CM

## 2021-02-14 DIAGNOSIS — R2231 Localized swelling, mass and lump, right upper limb: Secondary | ICD-10-CM

## 2021-02-14 DIAGNOSIS — G479 Sleep disorder, unspecified: Secondary | ICD-10-CM | POA: Diagnosis not present

## 2021-02-14 DIAGNOSIS — R0683 Snoring: Secondary | ICD-10-CM

## 2021-02-14 DIAGNOSIS — R200 Anesthesia of skin: Secondary | ICD-10-CM

## 2021-02-14 DIAGNOSIS — N529 Male erectile dysfunction, unspecified: Secondary | ICD-10-CM

## 2021-02-14 MED ORDER — SILDENAFIL CITRATE 100 MG PO TABS: 100.0000 mg | ORAL_TABLET | Freq: Every day | ORAL | 2 refills | Status: DC | PRN

## 2021-02-14 NOTE — Patient Instructions (Signed)
We are going to refer you to neurology for consult.   Make sure you talk to them about sleep problems and the hand numbness  Begin back on Gabapentin once daily before your bedtime to help with symptoms. If you run out or need refill let me know  Begin over the counter Aleve 1 tablet daily or twice daily during work hours for the next 7-14 days for pain and inflammation  Begin reinforced wrist splints/carpal tunnel splints at bedtime for the next 2 weeks  We will also refer to orthopedics for the right hand tissue swelling/enlargement

## 2021-02-14 NOTE — Progress Notes (Signed)
Subjective:  Mitchell Hughes is a 55 y.o. male who presents for Chief Complaint  Patient presents with   finger tingling    Fingers tips get numb and tingling. Been going on for months     He notes several month history of tingling and numbness in fingertips of both hands.   Feels it when he wakes up , when driving.  No neck pain or whole arm pain, but has been having tightness in left arm, left upper arm.   Stretching seems to help.  No numbness or tingling in legs.    Lives alone.  He knows he snores.  No prior sleep study.  Doesn't feel rested when he awakes, but has problems sleeping in general.  Averages 5-6 hours of sleep.  Works night shift.     He is working 12-hour shifts, third shift.  He has a swelling in his right hand between his thumb and first finger.  This has been there before but it is gotten bigger.  It does not cause any pain or discomfort or difficulty with using the hand but it has gotten bigger.  He would like a flu shot today  Erectile dysfunction-he would like a refill on sildenafil which works well for him.  No other aggravating or relieving factors.    No other c/o.  The following portions of the patient's history were reviewed and updated as appropriate: allergies, current medications, past family history, past medical history, past social history, past surgical history and problem list.  ROS Otherwise as in subjective above     Objective: BP 130/80   Pulse (!) 52   Wt 228 lb 3.2 oz (103.5 kg)   BMI 27.06 kg/m   General appearance: alert, no distress, well developed, well nourished Right hand in between the base of the thumb base of the first finger is a localized soft tissue swelling that seems to be lipoma versus muscle hypertrophy versus tumor or other.  Nontender.  No hard mass.  Otherwise fingers and hands nontender.  He does have flexion deformity of bilateral fifth fingers at the DIPs.  Rest of arms nontender with normal range of motion Neck  nontender, no mass no lymphadenopathy, normal range of motion No edema of extremities Pulses and cap refill normal in both hands and arms.  No steal of pulses overhead Neuro: Normal strength and sensation and DTRs of both arms, grip strength normal, normal strength of fingers, negative Phalen's and Tinel's     Assessment: Encounter Diagnoses  Name Primary?   Bilateral hand numbness Yes   Localized swelling on right hand    Sleep disturbance    Snoring    Erectile dysfunction, unspecified erectile dysfunction type    Need for influenza vaccination      Plan: Finger numbness-we discussed possible causes.  I reviewed labs he had done about a year ago that were mostly normal.  He has no new symptoms for diabetes or other vitamin deficiency.  He does snore, may have sleep apnea.  This could be other neuropathy as well.  Begin the recommendations below and referral to neurology for further evaluation.  He has gabapentin at home that he has not been using but left over from prior prescription from neuropathy of the leg which is chronic  Patient Instructions  We are going to refer you to neurology for consult.   Make sure you talk to them about sleep problems and the hand numbness  Begin back on Gabapentin once daily before your  bedtime to help with symptoms. If you run out or need refill let me know  Begin over the counter Aleve 1 tablet daily or twice daily during work hours for the next 7-14 days for pain and inflammation  Begin reinforced wrist splints/carpal tunnel splints at bedtime for the next 2 weeks  We will also refer to orthopedics for the right hand tissue swelling/enlargement   Sleep disturbance, snoring-referral to neurology for sleep evaluation.  Currently working third shift 12-hour shifts which is not helping with his sleep.  Swelling of hands-lipoma versus hypertrophy of muscle or tumor or other.  Referral back to orthopedics.  Erectile dysfunction-refilled  medication.  He does well on this medication.  Counseled on the influenza virus vaccine.  Vaccine information sheet given.  Influenza vaccine given after consent obtained.     Deen was seen today for finger tingling.  Diagnoses and all orders for this visit:  Bilateral hand numbness -     Ambulatory referral to Neurology  Localized swelling on right hand  Sleep disturbance -     Ambulatory referral to Neurology  Snoring -     Ambulatory referral to Neurology  Erectile dysfunction, unspecified erectile dysfunction type  Need for influenza vaccination  Other orders -     sildenafil (VIAGRA) 100 MG tablet; Take 1 tablet (100 mg total) by mouth daily as needed for erectile dysfunction.   Follow up: pending consult with neurology and orthopedics

## 2021-02-15 NOTE — Addendum Note (Signed)
Addended by: Minette Headland A on: 02/15/2021 11:03 AM   Modules accepted: Orders

## 2021-02-18 ENCOUNTER — Encounter: Payer: Self-pay | Admitting: Internal Medicine

## 2021-02-21 ENCOUNTER — Ambulatory Visit: Payer: BC Managed Care – PPO | Admitting: Medical

## 2021-02-21 ENCOUNTER — Other Ambulatory Visit: Payer: Self-pay

## 2021-02-21 VITALS — BP 128/70 | HR 67 | Ht 77.0 in | Wt 228.0 lb

## 2021-02-21 DIAGNOSIS — R0683 Snoring: Secondary | ICD-10-CM

## 2021-02-21 DIAGNOSIS — Z7185 Encounter for immunization safety counseling: Secondary | ICD-10-CM

## 2021-02-21 DIAGNOSIS — Z23 Encounter for immunization: Secondary | ICD-10-CM

## 2021-02-21 DIAGNOSIS — Z800 Family history of malignant neoplasm of digestive organs: Secondary | ICD-10-CM

## 2021-02-21 DIAGNOSIS — Z125 Encounter for screening for malignant neoplasm of prostate: Secondary | ICD-10-CM

## 2021-02-21 DIAGNOSIS — N529 Male erectile dysfunction, unspecified: Secondary | ICD-10-CM

## 2021-02-21 DIAGNOSIS — M25512 Pain in left shoulder: Secondary | ICD-10-CM

## 2021-02-21 DIAGNOSIS — I359 Nonrheumatic aortic valve disorder, unspecified: Secondary | ICD-10-CM

## 2021-02-21 DIAGNOSIS — G5791 Unspecified mononeuropathy of right lower limb: Secondary | ICD-10-CM

## 2021-02-21 DIAGNOSIS — Z0000 Encounter for general adult medical examination without abnormal findings: Secondary | ICD-10-CM

## 2021-02-21 DIAGNOSIS — M25511 Pain in right shoulder: Secondary | ICD-10-CM

## 2021-02-21 DIAGNOSIS — M1812 Unilateral primary osteoarthritis of first carpometacarpal joint, left hand: Secondary | ICD-10-CM

## 2021-02-21 DIAGNOSIS — R200 Anesthesia of skin: Secondary | ICD-10-CM

## 2021-02-21 DIAGNOSIS — Z8249 Family history of ischemic heart disease and other diseases of the circulatory system: Secondary | ICD-10-CM

## 2021-02-21 DIAGNOSIS — G479 Sleep disorder, unspecified: Secondary | ICD-10-CM | POA: Diagnosis not present

## 2021-02-21 DIAGNOSIS — I350 Nonrheumatic aortic (valve) stenosis: Secondary | ICD-10-CM

## 2021-02-21 DIAGNOSIS — Z136 Encounter for screening for cardiovascular disorders: Secondary | ICD-10-CM | POA: Diagnosis not present

## 2021-02-21 DIAGNOSIS — G8929 Other chronic pain: Secondary | ICD-10-CM

## 2021-02-21 DIAGNOSIS — Z8616 Personal history of COVID-19: Secondary | ICD-10-CM

## 2021-02-21 MED ORDER — GABAPENTIN 300 MG PO CAPS: 300.0000 mg | ORAL_CAPSULE | Freq: Every day | ORAL | 2 refills | Status: DC

## 2021-02-21 NOTE — Progress Notes (Signed)
[facility] Subjective:   HPI  Mitchell Hughes is a 55 y.o. male who presents for Chief Complaint  Patient presents with   fasting cpe     Fasting cpe, no concerns     Patient Care Team: Sheila Ocasio, Leward Quan as PCP - General (Family Medicine) Sees dentist Sees eye doctor Dr. Jean Rosenthal, Dr. Frankey Shown, Erskine Emery PA, ortho Dr. Erline Hau,  podiatry Dr. [place] Cellar, gastroenterology    Concerns: I saw him recently for sleep issues and numbness and right hand mass.  We made referrals to neurology and back to orthopedics.  He has appointments of both coming up.  We also discussed by chronic foot numbness and prior foot surgery.  He started back on gabapentin 100 mg he had at home.  History of aortic valve stenosis mild, no chest pain, no shortness breath, no edema.  Echocardiogram 2020    Reviewed their medical, surgical, family, social, medication, and allergy history and updated chart as appropriate.  Past Medical History:  Diagnosis Date   Erectile dysfunction    Family history of colon cancer    sister in 90s   Family history of heart disease 2011   baseline cardiac eval 2011 with Dr. Sallyanne Kuster   H/O echocardiogram 2015   Dr. Wynonia Lawman   History of exercise stress test    04/2014 Dr. Wynonia Lawman, prior 2011 normal Bruce treadmill stress test, Dr. Debara Pickett   LOW BACK PAIN, ACUTE 09/26/2009   RENAL CALCULUS, RIGHT 09/28/2009   9.71mm on CT   TOBACCO USE, QUIT 09/26/2009   Wears glasses     Past Surgical History:  Procedure Laterality Date   COLONOSCOPY  08/2017   tubular adenoma, repeat 5 years; Dr. Gordon Cellar   FOOT SURGERY     bone spur, right; Dr. Milinda Pointer   WISDOM TOOTH EXTRACTION      Family History  Problem Relation Age of Onset   Thyroid disease Mother    Other Mother        brain disease, possibly dementia?   Gallstones Mother    Other Father        spinal cord injury/accident   Heart disease Father 18   Heart disease Sister 55        artificial valve   Colon cancer Sister    Heart disease Brother 23       MI   Diabetes Brother    Pancreatic cancer Brother    Cancer Sister 79       colon   Cancer Sister        pancreas   Pancreatic cancer Sister    Heart disease Sister    Heart disease Brother 59       pacemaker   Cancer Brother        liver, formerly pancreas   Heart disease Other        parent, other relative   Colon cancer Other    Stroke Neg Hx    Hypertension Neg Hx    Rectal cancer Neg Hx    Stomach cancer Neg Hx      Current Outpatient Medications:    gabapentin (NEURONTIN) 300 MG capsule, Take 1 capsule (300 mg total) by mouth at bedtime., Disp: 30 capsule, Rfl: 2   sildenafil (VIAGRA) 100 MG tablet, Take 1 tablet (100 mg total) by mouth daily as needed for erectile dysfunction., Disp: 10 tablet, Rfl: 2  No Known Allergies     Review of Systems Constitutional: -fever, -chills, -sweats, -  unexpected weight change, -decreased appetite, -fatigue Allergy: -sneezing, -itching, -congestion Dermatology: -changing moles, -rash, +lumps ENT: -runny nose, -ear pain, -sore throat, -hoarseness, -sinus pain, -teeth pain, - ringing in ears, -hearing loss, -nosebleeds Cardiology: -chest pain, -palpitations, -swelling, -difficulty breathing when lying flat, -waking up short of breath Respiratory: -cough, -shortness of breath, -difficulty breathing with exercise or exertion, -wheezing, -coughing up blood Gastroenterology: -abdominal pain, -nausea, -vomiting, -diarrhea, -constipation, -blood in stool, -changes in bowel movement, -difficulty swallowing or eating Hematology: -bleeding, -bruising  Musculoskeletal: -joint aches, +muscle aches, -joint swelling, -back pain, -neck pain, -cramping, -changes in gait Ophthalmology: denies vision changes, eye redness, itching, discharge Urology: -burning with urination, -difficulty urinating, -blood in urine, -urinary frequency, -urgency, -incontinence Neurology:  -headache, -weakness, -tingling, +numbness, -memory loss, -falls, -dizziness Psychology: -depressed mood, -agitation, -sleep problems Male GU: no testicular mass, pain, no lymph nodes swollen, no swelling, no rash.     Objective:  BP 128/70   Pulse 67   Ht 6\' 5"  (1.956 m)   Wt 228 lb (103.4 kg)   BMI 27.04 kg/m   BP Readings from Last 3 Encounters:  02/21/21 128/70  02/14/21 130/80  02/17/20 138/82   Wt Readings from Last 3 Encounters:  02/21/21 228 lb (103.4 kg)  02/14/21 228 lb 3.2 oz (103.5 kg)  02/17/20 243 lb 9.6 oz (110.5 kg)    General appearance: alert, no distress, WD/WN, African American male Skin: no worrisome skin lesions HEENT: normocephalic, conjunctiva/corneas normal, sclerae anicteric, PERRLA, EOMi Neck: supple, no lymphadenopathy, no thyromegaly, no masses, normal ROM, no bruits Chest: non tender, normal shape and expansion Heart: RRR, normal S1, S2, no murmurs Lungs: CTA bilaterally, no wheezes, rhonchi, or rales Abdomen: +bs, soft, non tender, non distended, no masses, no hepatomegaly, no splenomegaly, no bruits Back: non tender, normal ROM, no scoliosis Musculoskeletal: Surgical scars noted laterally of right foot dorsally, soft tissue mass of right hand between thumb and index finger, upper extremities non tender, no obvious deformity, normal ROM throughout, lower extremities non tender, no obvious deformity, normal ROM throughout Extremities: no edema, no cyanosis, no clubbing Pulses: 2+ symmetric, upper and lower extremities, normal cap refill Neurological: alert, oriented x 3, CN2-12 intact, strength normal upper extremities and lower extremities, sensation normal throughout, DTRs 2+ throughout, no cerebellar signs, gait normal Psychiatric: normal affect, behavior normal, pleasant  GU: normal male external genitalia, nontender, no masses, right inguinal hernia moderate bulge, nontender, reducible, no other hernia, no lymphadenopathy Rectal:  deferred   Echocardiogram 02/2019 1. Left ventricular ejection fraction, by visual estimation, is 60 to 65%. The left ventricle has normal function. Normal left ventricular size. There is no left ventricular hypertrophy. 2. Global right ventricle has normal systolic function.The right ventricular size is normal. No increase in right ventricular wall thickness. 3. Left atrial size was normal. 4. Right atrial size was normal. 5. The mitral valve is normal in structure. No evidence of mitral valve regurgitation. No evidence of mitral stenosis. 6. The tricuspid valve is normal in structure. Tricuspid valve regurgitation was not visualized by color flow Doppler. 7. The aortic valve was not well visualized Aortic valve regurgitation is mild by color flow Doppler. Mild aortic valve stenosis. 8. Not well visualized but likely tri leaflet Moderate calcification and restricted leaflet motion. 9. The pulmonic valve was normal in structure. Pulmonic valve regurgitation is not visualized by color flow Doppler. 10. Normal pulmonary artery systolic pressure. 11. The inferior vena cava is normal in size with greater than 50% respiratory variability, suggesting right  atrial pressure of 3 mmHg.   Assessment and Plan :   Encounter Diagnoses  Name Primary?   Encounter for health maintenance examination in adult Yes   Need for shingles vaccine    Vaccine counseling    Sleep disturbance    Snoring    Screening for prostate cancer    Primary osteoarthritis of first carpometacarpal joint of left hand    Neuropathy of foot, right    Aortic valve calcification    Bilateral hand numbness    Chronic pain of both shoulders    Erectile dysfunction, unspecified erectile dysfunction type    Family history of colon cancer    Family history of heart disease    History of COVID-19    Aortic valve stenosis, etiology of cardiac valve disease unspecified     Physical exam - discussed and counseled on healthy  lifestyle, diet, exercise, preventative care, vaccinations, sick and well care, proper use of emergency dept and after hours care, and addressed their concerns.    Health screening: See your eye doctor yearly for routine vision care. See your dentist yearly for routine dental care including hygiene visits twice yearly.  Cancer screening Colonoscopy:  Reviewed colonoscopy on file that is up to date from 2019   PSA prostate screening labs today  Check your skin regularly for new or changing moles   Vaccines: Immunization History  Administered Date(s) Administered   Influenza,inj,Quad PF,6+ Mos 12/21/2013, 02/17/2020, 02/14/2021   Influenza-Unspecified 03/16/2015, 03/05/2016, 02/02/2017   PFIZER(Purple Top)SARS-COV-2 Vaccination 07/17/2019, 08/08/2019   Tdap 12/21/2013   Zoster Recombinat (Shingrix) 05/19/2020, 02/21/2021    Counseled on the Shingrix vaccine.  Vaccine information sheet given. Shingrix #2 vaccine given after consent obtained.       Separate significant issues discussed: Aortic valve calcification, family history of heart disease-I reviewed the echocardiogram from October 2020 and last cardiology notes in the chart record.  Plan repeat echocardiogram possibly next year 2023.  No current symptoms and no obvious murmur  Family history of colon cancer-reviewed colonoscopy on file up-to-date  Right inguinal hernia-mild to moderate, no symptoms.  Refer to general surgery to consult on repair  Chronic neuropathic issues with right leg and right foot, after another week or 2 on 100 mg gabapentin nightly increase to gabapentin 300 mg daily  Numbness of hands, right hand mass - he has follow-up with orthopedics planned soon  Sleep issues, neuropathy, numbness-follow-up with neurology as planned, new consult soon    Yeng was seen today for fasting cpe .  Diagnoses and all orders for this visit:  Encounter for health maintenance examination in adult -      Comprehensive metabolic panel -     CBC -     Lipid panel -     Vitamin B12 -     PSA -     Hemoglobin A1c -     Ambulatory referral to General Surgery  Need for shingles vaccine -     Varicella-zoster vaccine IM  Vaccine counseling  Sleep disturbance  Snoring  Screening for prostate cancer -     PSA  Primary osteoarthritis of first carpometacarpal joint of left hand  Neuropathy of foot, right  Aortic valve calcification  Bilateral hand numbness  Chronic pain of both shoulders  Erectile dysfunction, unspecified erectile dysfunction type  Family history of colon cancer  Family history of heart disease  History of COVID-19  Aortic valve stenosis, etiology of cardiac valve disease unspecified  Other orders -  gabapentin (NEURONTIN) 300 MG capsule; Take 1 capsule (300 mg total) by mouth at bedtime.   Follow-up pending labs, yearly for physical

## 2021-02-22 LAB — HEMOGLOBIN A1C
Est. average glucose Bld gHb Est-mCnc: 111 mg/dL
Hgb A1c MFr Bld: 5.5 % (ref 4.8–5.6)

## 2021-02-22 LAB — COMPREHENSIVE METABOLIC PANEL
ALT: 22 IU/L (ref 0–44)
AST: 23 IU/L (ref 0–40)
Albumin/Globulin Ratio: 2.2 (ref 1.2–2.2)
Albumin: 4.9 g/dL (ref 3.8–4.9)
Alkaline Phosphatase: 66 IU/L (ref 44–121)
BUN/Creatinine Ratio: 17 (ref 9–20)
BUN: 20 mg/dL (ref 6–24)
Bilirubin Total: 0.5 mg/dL (ref 0.0–1.2)
CO2: 23 mmol/L (ref 20–29)
Calcium: 9.9 mg/dL (ref 8.7–10.2)
Chloride: 102 mmol/L (ref 96–106)
Creatinine, Ser: 1.16 mg/dL (ref 0.76–1.27)
Globulin, Total: 2.2 g/dL (ref 1.5–4.5)
Glucose: 101 mg/dL — ABNORMAL HIGH (ref 70–99)
Potassium: 4.6 mmol/L (ref 3.5–5.2)
Sodium: 140 mmol/L (ref 134–144)
Total Protein: 7.1 g/dL (ref 6.0–8.5)
eGFR: 73 mL/min/{1.73_m2} (ref >59)

## 2021-02-22 LAB — CBC
Hematocrit: 43.5 % (ref 37.5–51.0)
Hemoglobin: 14.9 g/dL (ref 13.0–17.7)
MCH: 30.1 pg (ref 26.6–33.0)
MCHC: 34.3 g/dL (ref 31.5–35.7)
MCV: 88 fL (ref 79–97)
Platelets: 263 10*3/uL (ref 150–450)
RBC: 4.95 x10E6/uL (ref 4.14–5.80)
RDW: 12.4 % (ref 11.6–15.4)
WBC: 6.3 10*3/uL (ref 3.4–10.8)

## 2021-02-22 LAB — LIPID PANEL
Chol/HDL Ratio: 3.8 ratio (ref 0.0–5.0)
Cholesterol, Total: 162 mg/dL (ref 100–199)
HDL: 43 mg/dL (ref >39)
LDL Chol Calc (NIH): 104 mg/dL — ABNORMAL HIGH (ref 0–99)
Triglycerides: 77 mg/dL (ref 0–149)
VLDL Cholesterol Cal: 15 mg/dL (ref 5–40)

## 2021-02-22 LAB — PSA: Prostate Specific Ag, Serum: 1.7 ng/mL (ref 0.0–4.0)

## 2021-02-22 LAB — VITAMIN B12: Vitamin B-12: 277 pg/mL (ref 232–1245)

## 2021-04-13 ENCOUNTER — Emergency Department (HOSPITAL_COMMUNITY): Payer: BC Managed Care – PPO

## 2021-04-13 ENCOUNTER — Encounter (HOSPITAL_COMMUNITY): Payer: Self-pay

## 2021-04-13 ENCOUNTER — Emergency Department (HOSPITAL_COMMUNITY)
Admission: EM | Admit: 2021-04-13 | Discharge: 2021-04-13 | Disposition: A | Discharge status: Home / Self Care | Payer: BC Managed Care – PPO | Source: Home / Self Care | Attending: Emergency Medicine | Admitting: Emergency Medicine

## 2021-04-13 ENCOUNTER — Other Ambulatory Visit: Payer: Self-pay

## 2021-04-13 DIAGNOSIS — Z87891 Personal history of nicotine dependence: Secondary | ICD-10-CM | POA: Insufficient documentation

## 2021-04-13 DIAGNOSIS — R1031 Right lower quadrant pain: Secondary | ICD-10-CM | POA: Diagnosis not present

## 2021-04-13 DIAGNOSIS — N2889 Other specified disorders of kidney and ureter: Secondary | ICD-10-CM

## 2021-04-13 DIAGNOSIS — N200 Calculus of kidney: Secondary | ICD-10-CM | POA: Insufficient documentation

## 2021-04-13 DIAGNOSIS — Z8616 Personal history of COVID-19: Secondary | ICD-10-CM | POA: Insufficient documentation

## 2021-04-13 DIAGNOSIS — N23 Unspecified renal colic: Secondary | ICD-10-CM

## 2021-04-13 LAB — COMPREHENSIVE METABOLIC PANEL
ALT: 24 U/L (ref 0–44)
AST: 24 U/L (ref 15–41)
Albumin: 4.5 g/dL (ref 3.5–5.0)
Alkaline Phosphatase: 59 U/L (ref 38–126)
Anion gap: 10 (ref 5–15)
BUN: 18 mg/dL (ref 6–20)
CO2: 19 mmol/L — ABNORMAL LOW (ref 22–32)
Calcium: 9.5 mg/dL (ref 8.9–10.3)
Chloride: 107 mmol/L (ref 98–111)
Creatinine, Ser: 1.19 mg/dL (ref 0.61–1.24)
GFR, Estimated: >60 mL/min (ref >60)
Glucose, Bld: 122 mg/dL — ABNORMAL HIGH (ref 70–99)
Potassium: 4 mmol/L (ref 3.5–5.1)
Sodium: 136 mmol/L (ref 135–145)
Total Bilirubin: 0.5 mg/dL (ref 0.3–1.2)
Total Protein: 7.4 g/dL (ref 6.5–8.1)

## 2021-04-13 LAB — URINALYSIS, ROUTINE W REFLEX MICROSCOPIC
Bilirubin Urine: NEGATIVE
Glucose, UA: NEGATIVE mg/dL
Ketones, ur: NEGATIVE mg/dL
Nitrite: NEGATIVE
Protein, ur: NEGATIVE mg/dL
Specific Gravity, Urine: 1.01 (ref 1.005–1.030)
pH: 7 (ref 5.0–8.0)

## 2021-04-13 LAB — CBC WITH DIFFERENTIAL/PLATELET
Abs Immature Granulocytes: 0.04 10*3/uL (ref 0.00–0.07)
Basophils Absolute: 0 10*3/uL (ref 0.0–0.1)
Basophils Relative: 0 %
Eosinophils Absolute: 0.1 10*3/uL (ref 0.0–0.5)
Eosinophils Relative: 1 %
HCT: 44.3 % (ref 39.0–52.0)
Hemoglobin: 15.9 g/dL (ref 13.0–17.0)
Immature Granulocytes: 0 %
Lymphocytes Relative: 7 %
Lymphs Abs: 1 10*3/uL (ref 0.7–4.0)
MCH: 31.2 pg (ref 26.0–34.0)
MCHC: 35.9 g/dL (ref 30.0–36.0)
MCV: 86.9 fL (ref 80.0–100.0)
Monocytes Absolute: 0.4 10*3/uL (ref 0.1–1.0)
Monocytes Relative: 3 %
Neutro Abs: 11.6 10*3/uL — ABNORMAL HIGH (ref 1.7–7.7)
Neutrophils Relative %: 89 %
Platelets: 245 10*3/uL (ref 150–400)
RBC: 5.1 MIL/uL (ref 4.22–5.81)
RDW: 12 % (ref 11.5–15.5)
WBC: 13.1 10*3/uL — ABNORMAL HIGH (ref 4.0–10.5)
nRBC: 0 % (ref 0.0–0.2)

## 2021-04-13 LAB — URINALYSIS, MICROSCOPIC (REFLEX)

## 2021-04-13 MED ORDER — IOHEXOL 300 MG/ML  SOLN
100.0000 mL | Freq: Once | INTRAMUSCULAR | Status: AC | PRN
  Administered 2021-04-13: 100 mL via INTRAVENOUS

## 2021-04-13 MED ORDER — KETOROLAC TROMETHAMINE 30 MG/ML IJ SOLN
30.0000 mg | Freq: Once | INTRAMUSCULAR | Status: AC
  Administered 2021-04-13: 30 mg via INTRAVENOUS
  Filled 2021-04-13: qty 1

## 2021-04-13 MED ORDER — HYDROMORPHONE HCL 1 MG/ML IJ SOLN
1.0000 mg | Freq: Once | INTRAMUSCULAR | Status: AC
  Administered 2021-04-13: 1 mg via INTRAVENOUS
  Filled 2021-04-13: qty 1

## 2021-04-13 MED ORDER — ONDANSETRON HCL 4 MG/2ML IJ SOLN
4.0000 mg | Freq: Once | INTRAMUSCULAR | Status: AC
  Administered 2021-04-13: 4 mg via INTRAVENOUS
  Filled 2021-04-13: qty 2

## 2021-04-13 MED ORDER — IBUPROFEN 800 MG PO TABS: 800.0000 mg | ORAL_TABLET | Freq: Three times a day (TID) | ORAL | 0 refills | Status: DC

## 2021-04-13 MED ORDER — OXYCODONE-ACETAMINOPHEN 5-325 MG PO TABS: 1.0000 | ORAL_TABLET | Freq: Four times a day (QID) | ORAL | 0 refills | Status: DC | PRN

## 2021-04-13 NOTE — ED Provider Notes (Signed)
[facility] EMERGENCY DEPARTMENT Provider Note   CSN: [id number] Arrival date & time: 04/13/21  0441     History Chief Complaint  Patient presents with   Abdominal Pain    RLQ    Mitchell Hughes is a 55 y.o. male.  Patient presents to the emergency department for evaluation of right flank and right lower abdominal pain.  Patient reports that the pain awakened him from sleep tonight.  Patient has not had a bowel movement in several days, thought he was constipated.  This pain, however, came on suddenly.  No associated urinary symptoms.  No vomiting.  No fever.      Past Medical History:  Diagnosis Date   Erectile dysfunction    Family history of colon cancer    sister in 40s   Family history of heart disease 2011   baseline cardiac eval 2011 with Dr. Sallyanne Kuster   H/O echocardiogram 2015   Dr. Wynonia Lawman   History of exercise stress test    04/2014 Dr. Wynonia Lawman, prior 2011 normal Bruce treadmill stress test, Dr. Debara Pickett   LOW BACK PAIN, ACUTE 09/26/2009   RENAL CALCULUS, RIGHT 09/28/2009   9.12mm on CT   TOBACCO USE, QUIT 09/26/2009   Wears glasses     Patient Active Problem List   Diagnosis Date Noted   Bilateral hand numbness 02/14/2021   Localized swelling on right hand 02/14/2021   Sleep disturbance 02/14/2021   Snoring 02/14/2021   Erectile dysfunction 02/14/2021   Need for influenza vaccination 02/14/2021   Impingement syndrome of left shoulder 06/20/2020   Primary osteoarthritis of first carpometacarpal joint of left hand 06/20/2020   Encounter for health maintenance examination in adult 02/17/2020   Screening for prostate cancer 02/17/2020   History of COVID-19 02/17/2020   Toenail deformity 02/17/2020   Neuropathy of foot, right 02/17/2020   Chronic pain of both shoulders 04/22/2019   Decreased range of motion of shoulder 04/22/2019   Degenerative arthritis of thumb, left 04/22/2019   Right inguinal hernia 01/28/2019   Aortic valve calcification 01/28/2019   Vaccine  counseling 06/22/2017   Family history of heart disease 12/21/2013   Family history of colon cancer 12/21/2013    Past Surgical History:  Procedure Laterality Date   COLONOSCOPY  08/2017   tubular adenoma, repeat 5 years; Dr. Sublimity Cellar   FOOT SURGERY     bone spur, right; Dr. Milinda Pointer   WISDOM TOOTH EXTRACTION         Family History  Problem Relation Age of Onset   Thyroid disease Mother    Other Mother        brain disease, possibly dementia?   Gallstones Mother    Other Father        spinal cord injury/accident   Heart disease Father 83   Heart disease Sister 41       artificial valve   Colon cancer Sister    Heart disease Brother 55       MI   Diabetes Brother    Pancreatic cancer Brother    Cancer Sister 97       colon   Cancer Sister        pancreas   Pancreatic cancer Sister    Heart disease Sister    Heart disease Brother 46       pacemaker   Cancer Brother        liver, formerly pancreas   Heart disease Other        parent, other  relative   Colon cancer Other    Stroke Neg Hx    Hypertension Neg Hx    Rectal cancer Neg Hx    Stomach cancer Neg Hx     Social History   Tobacco Use   Smoking status: Former    Packs/day: 0.25    Years: 30.00    Pack years: 7.50    Types: Cigarettes    Quit date: 03/05/2009    Years since quitting: 12.1   Smokeless tobacco: Never   Tobacco comments:    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 Authorizing Provider  gabapentin (NEURONTIN) 300 MG capsule Take 1 capsule (300 mg total) by mouth at bedtime. 02/21/21   Tysinger, Camelia Eng, PA-C  sildenafil (VIAGRA) 100 MG tablet Take 1 tablet (100 mg total) by mouth daily  as needed for erectile dysfunction. 02/14/21   Tysinger, Camelia Eng, PA-C    Allergies    Patient has no known allergies.  Review of Systems   Review of Systems  Gastrointestinal:  Positive for abdominal distention, abdominal pain and constipation.  All other systems reviewed and are negative.  Physical Exam Updated Vital Signs BP 137/72   Pulse 78   Temp 98.3 F (36.8 C) (Oral)   Resp 20   Ht 6\' 5"  (1.956 m)   Wt 99.8 kg   SpO2 92%   BMI 26.09 kg/m   Physical Exam Vitals and nursing note reviewed.  Constitutional:      General: He is not in acute distress.    Appearance: Normal appearance. He is well-developed.  HENT:     Head: Normocephalic and atraumatic.     Right Ear: Hearing normal.     Left Ear: Hearing normal.     Nose: Nose normal.  Eyes:     Conjunctiva/sclera: Conjunctivae normal.     Pupils: Pupils are equal, round, and reactive to light.  Cardiovascular:     Rate and Rhythm: Regular rhythm.     Heart sounds: S1 normal and S2 normal. No murmur heard.   No friction rub. No gallop.  Pulmonary:     Effort: Pulmonary effort is normal. No respiratory distress.     Breath sounds: Normal breath sounds.  Chest:     Chest wall: No tenderness.  Abdominal:     General: Bowel sounds are normal.     Palpations: Abdomen is soft.     Tenderness: There is abdominal tenderness in the right lower quadrant. There is no guarding or rebound. Negative signs include Murphy's sign and McBurney's sign.     Hernia: No hernia is present.  Musculoskeletal:        General: Normal range of motion.     Cervical back: Normal range of motion and neck supple.  Skin:    General: Skin is warm and dry.     Findings: No rash.  Neurological:     Mental Status: He is alert and oriented to person, place, and time.     GCS: GCS eye subscore is 4. GCS verbal subscore is 5. GCS motor subscore is 6.     Cranial Nerves: No  cranial nerve deficit.     Sensory: No sensory deficit.      Coordination: Coordination normal.  Psychiatric:        Speech: Speech normal.        Behavior: Behavior normal.        Thought Content: Thought content normal.    ED Results / Procedures / Treatments   Labs (all labs ordered are listed, but only abnormal results are displayed) Labs Reviewed  CBC WITH DIFFERENTIAL/PLATELET - Abnormal; Notable for the following components:      Result Value   WBC 13.1 (*)    Neutro Abs 11.6 (*)    All other components within normal limits  COMPREHENSIVE METABOLIC PANEL - Abnormal; Notable for the following components:   CO2 19 (*)    Glucose, Bld 122 (*)    All other components within normal limits  URINALYSIS, ROUTINE W REFLEX MICROSCOPIC - Abnormal; Notable for the following components:   Hgb urine dipstick SMALL (*)    Leukocytes,Ua TRACE (*)    All other components within normal limits  URINALYSIS, MICROSCOPIC (REFLEX) - Abnormal; Notable for the following components:   Bacteria, UA MANY (*)    All other components within normal limits    EKG None  Radiology No results found.  Procedures Procedures   Medications Ordered in ED Medications  HYDROmorphone (DILAUDID) injection 1 mg (1 mg Intravenous Given 04/13/21 0530)  ondansetron (ZOFRAN) injection 4 mg (4 mg Intravenous Given 04/13/21 0530)  iohexol (OMNIPAQUE) 300 MG/ML solution 100 mL (100 mLs Intravenous Contrast Given 04/13/21 0739)    ED Course  I have reviewed the triage vital signs and the nursing notes.  Pertinent labs & imaging results that were available during my care of the patient were reviewed by me and considered in my medical decision making (see chart for details).    MDM Rules/Calculators/A&P                           Patient with right flank and right lower quadrant pain.  Patient does have some right lower quadrant tenderness.  Patient complaining of constipation.  Differential diagnosis would be appendicitis, other intra-abdominal inflammatory process or  infection and kidney stone.  Will perform CT scan to further evaluate.  Will sign to oncoming ER physician to follow-up on CT.  Final Clinical Impression(s) / ED Diagnoses Final diagnoses:  Right lower quadrant abdominal pain    Rx / DC Orders ED Discharge Orders     None        Orpah Greek, MD 04/13/21 [phone]

## 2021-04-13 NOTE — ED Triage Notes (Signed)
Pt reports sudden onset of RLQ pain with nausea. Pt reports abd is distended, has not had BM in 3 days. Pt appears in pain.

## 2021-04-13 NOTE — ED Provider Notes (Signed)
Care of the patient assumed at the change of shift. Patient with RLQ and R flank pain, awaiting CT.  Physical Exam  BP 138/72   Pulse 65   Temp 98.3 F (36.8 C) (Oral)   Resp 20   Ht 6\' 5"  (1.956 m)   Wt 99.8 kg   SpO2 95%   BMI 26.09 kg/m   Physical Exam Uncomfortable but no distress  ED Course/Procedures     Procedures  MDM  CT images and results reviewed with the patient. He is still having pain. Will give a dose of Toradol and discuss with Urology.   9:18 AM Spoke with Dr. 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 Patient is aware of both findings. Plan discharge with pain medications for comfort. RTED, preferably [facility], if pain continues.        Truddie Hidden, MD 04/13/21 [phone]

## 2021-04-14 ENCOUNTER — Emergency Department (HOSPITAL_COMMUNITY): Payer: BC Managed Care – PPO

## 2021-04-14 ENCOUNTER — Inpatient Hospital Stay (HOSPITAL_COMMUNITY): Payer: BC Managed Care – PPO

## 2021-04-14 ENCOUNTER — Inpatient Hospital Stay (HOSPITAL_COMMUNITY)
Admission: EM | Admit: 2021-04-14 | Discharge: 2021-04-17 | DRG: 872 | Disposition: A | Discharge status: Home / Self Care | Payer: BC Managed Care – PPO | Attending: Internal Medicine | Admitting: Internal Medicine

## 2021-04-14 ENCOUNTER — Encounter (HOSPITAL_COMMUNITY): Payer: Self-pay | Admitting: Emergency Medicine

## 2021-04-14 DIAGNOSIS — N136 Pyonephrosis: Secondary | ICD-10-CM | POA: Diagnosis not present

## 2021-04-14 DIAGNOSIS — N200 Calculus of kidney: Secondary | ICD-10-CM

## 2021-04-14 DIAGNOSIS — R319 Hematuria, unspecified: Secondary | ICD-10-CM | POA: Diagnosis present

## 2021-04-14 DIAGNOSIS — Z800 Family history of malignant neoplasm of digestive organs: Secondary | ICD-10-CM

## 2021-04-14 DIAGNOSIS — N132 Hydronephrosis with renal and ureteral calculous obstruction: Secondary | ICD-10-CM | POA: Diagnosis not present

## 2021-04-14 DIAGNOSIS — N390 Urinary tract infection, site not specified: Secondary | ICD-10-CM | POA: Diagnosis present

## 2021-04-14 DIAGNOSIS — E860 Dehydration: Secondary | ICD-10-CM | POA: Diagnosis not present

## 2021-04-14 DIAGNOSIS — R1031 Right lower quadrant pain: Secondary | ICD-10-CM | POA: Diagnosis not present

## 2021-04-14 DIAGNOSIS — B9689 Other specified bacterial agents as the cause of diseases classified elsewhere: Secondary | ICD-10-CM | POA: Diagnosis not present

## 2021-04-14 DIAGNOSIS — Z87442 Personal history of urinary calculi: Secondary | ICD-10-CM

## 2021-04-14 DIAGNOSIS — A4159 Other Gram-negative sepsis: Principal | ICD-10-CM | POA: Diagnosis present

## 2021-04-14 DIAGNOSIS — Z833 Family history of diabetes mellitus: Secondary | ICD-10-CM

## 2021-04-14 DIAGNOSIS — K5900 Constipation, unspecified: Secondary | ICD-10-CM | POA: Diagnosis not present

## 2021-04-14 DIAGNOSIS — Z8349 Family history of other endocrine, nutritional and metabolic diseases: Secondary | ICD-10-CM | POA: Diagnosis not present

## 2021-04-14 DIAGNOSIS — Z0389 Encounter for observation for other suspected diseases and conditions ruled out: Secondary | ICD-10-CM | POA: Diagnosis not present

## 2021-04-14 DIAGNOSIS — R6520 Severe sepsis without septic shock: Secondary | ICD-10-CM | POA: Diagnosis not present

## 2021-04-14 DIAGNOSIS — Z791 Long term (current) use of non-steroidal anti-inflammatories (NSAID): Secondary | ICD-10-CM | POA: Diagnosis not present

## 2021-04-14 DIAGNOSIS — N281 Cyst of kidney, acquired: Secondary | ICD-10-CM | POA: Diagnosis not present

## 2021-04-14 DIAGNOSIS — T39395A Adverse effect of other nonsteroidal anti-inflammatory drugs [NSAID], initial encounter: Secondary | ICD-10-CM | POA: Diagnosis not present

## 2021-04-14 DIAGNOSIS — N400 Enlarged prostate without lower urinary tract symptoms: Secondary | ICD-10-CM | POA: Diagnosis not present

## 2021-04-14 DIAGNOSIS — Z8249 Family history of ischemic heart disease and other diseases of the circulatory system: Secondary | ICD-10-CM | POA: Diagnosis not present

## 2021-04-14 DIAGNOSIS — N179 Acute kidney failure, unspecified: Secondary | ICD-10-CM | POA: Diagnosis present

## 2021-04-14 DIAGNOSIS — R9389 Abnormal findings on diagnostic imaging of other specified body structures: Secondary | ICD-10-CM

## 2021-04-14 DIAGNOSIS — G8929 Other chronic pain: Secondary | ICD-10-CM | POA: Diagnosis present

## 2021-04-14 DIAGNOSIS — N2889 Other specified disorders of kidney and ureter: Secondary | ICD-10-CM | POA: Diagnosis not present

## 2021-04-14 DIAGNOSIS — A419 Sepsis, unspecified organism: Secondary | ICD-10-CM | POA: Diagnosis not present

## 2021-04-14 DIAGNOSIS — Z20822 Contact with and (suspected) exposure to covid-19: Secondary | ICD-10-CM | POA: Diagnosis not present

## 2021-04-14 DIAGNOSIS — Z87891 Personal history of nicotine dependence: Secondary | ICD-10-CM | POA: Diagnosis not present

## 2021-04-14 DIAGNOSIS — R161 Splenomegaly, not elsewhere classified: Secondary | ICD-10-CM | POA: Diagnosis not present

## 2021-04-14 DIAGNOSIS — Z79899 Other long term (current) drug therapy: Secondary | ICD-10-CM | POA: Diagnosis not present

## 2021-04-14 DIAGNOSIS — R000 Tachycardia, unspecified: Secondary | ICD-10-CM | POA: Diagnosis not present

## 2021-04-14 DIAGNOSIS — Z936 Other artificial openings of urinary tract status: Secondary | ICD-10-CM | POA: Diagnosis not present

## 2021-04-14 HISTORY — PX: IR NEPHROSTOMY PLACEMENT RIGHT: IMG6064

## 2021-04-14 LAB — URINALYSIS, ROUTINE W REFLEX MICROSCOPIC
Bilirubin Urine: NEGATIVE
Glucose, UA: NEGATIVE mg/dL
Ketones, ur: NEGATIVE mg/dL
Nitrite: NEGATIVE
Protein, ur: 30 mg/dL — AB
Specific Gravity, Urine: 1.02 (ref 1.005–1.030)
pH: 6 (ref 5.0–8.0)

## 2021-04-14 LAB — COMPREHENSIVE METABOLIC PANEL
ALT: 28 U/L (ref 0–44)
AST: 35 U/L (ref 15–41)
Albumin: 4.2 g/dL (ref 3.5–5.0)
Alkaline Phosphatase: 58 U/L (ref 38–126)
Anion gap: 11 (ref 5–15)
BUN: 28 mg/dL — ABNORMAL HIGH (ref 6–20)
CO2: 21 mmol/L — ABNORMAL LOW (ref 22–32)
Calcium: 9.2 mg/dL (ref 8.9–10.3)
Chloride: 105 mmol/L (ref 98–111)
Creatinine, Ser: 1.86 mg/dL — ABNORMAL HIGH (ref 0.61–1.24)
GFR, Estimated: 42 mL/min — ABNORMAL LOW (ref >60)
Glucose, Bld: 155 mg/dL — ABNORMAL HIGH (ref 70–99)
Potassium: 3.8 mmol/L (ref 3.5–5.1)
Sodium: 137 mmol/L (ref 135–145)
Total Bilirubin: 2.8 mg/dL — ABNORMAL HIGH (ref 0.3–1.2)
Total Protein: 7.5 g/dL (ref 6.5–8.1)

## 2021-04-14 LAB — CBC WITH DIFFERENTIAL/PLATELET
Abs Immature Granulocytes: 0.1 10*3/uL — ABNORMAL HIGH (ref 0.00–0.07)
Basophils Absolute: 0.1 10*3/uL (ref 0.0–0.1)
Basophils Relative: 0 %
Eosinophils Absolute: 0 10*3/uL (ref 0.0–0.5)
Eosinophils Relative: 0 %
HCT: 44.1 % (ref 39.0–52.0)
Hemoglobin: 15.2 g/dL (ref 13.0–17.0)
Immature Granulocytes: 1 %
Lymphocytes Relative: 8 %
Lymphs Abs: 1 10*3/uL (ref 0.7–4.0)
MCH: 30.7 pg (ref 26.0–34.0)
MCHC: 34.5 g/dL (ref 30.0–36.0)
MCV: 89.1 fL (ref 80.0–100.0)
Monocytes Absolute: 0.2 10*3/uL (ref 0.1–1.0)
Monocytes Relative: 1 %
Neutro Abs: 10.9 10*3/uL — ABNORMAL HIGH (ref 1.7–7.7)
Neutrophils Relative %: 90 %
Platelets: 182 10*3/uL (ref 150–400)
RBC: 4.95 MIL/uL (ref 4.22–5.81)
RDW: 12.7 % (ref 11.5–15.5)
WBC: 12.2 10*3/uL — ABNORMAL HIGH (ref 4.0–10.5)
nRBC: 0 % (ref 0.0–0.2)

## 2021-04-14 LAB — BASIC METABOLIC PANEL
Anion gap: 6 (ref 5–15)
BUN: 27 mg/dL — ABNORMAL HIGH (ref 6–20)
CO2: 24 mmol/L (ref 22–32)
Calcium: 8.7 mg/dL — ABNORMAL LOW (ref 8.9–10.3)
Chloride: 109 mmol/L (ref 98–111)
Creatinine, Ser: 1.58 mg/dL — ABNORMAL HIGH (ref 0.61–1.24)
GFR, Estimated: 51 mL/min — ABNORMAL LOW (ref >60)
Glucose, Bld: 121 mg/dL — ABNORMAL HIGH (ref 70–99)
Potassium: 3.3 mmol/L — ABNORMAL LOW (ref 3.5–5.1)
Sodium: 139 mmol/L (ref 135–145)

## 2021-04-14 LAB — APTT: aPTT: 28 s (ref 24–36)

## 2021-04-14 LAB — URINALYSIS, MICROSCOPIC (REFLEX)

## 2021-04-14 LAB — LACTIC ACID, PLASMA
Lactic Acid, Venous: 3.8 mmol/L (ref 0.5–1.9)
Lactic Acid, Venous: 1.5 mmol/L (ref 0.5–1.9)
Lactic Acid, Venous: 1.5 mmol/L (ref 0.5–1.9)

## 2021-04-14 LAB — HIV ANTIBODY (ROUTINE TESTING W REFLEX): HIV Screen 4th Generation wRfx: NONREACTIVE

## 2021-04-14 LAB — RESP PANEL BY RT-PCR (FLU A&B, COVID) ARPGX2
Influenza A by PCR: NEGATIVE
Influenza B by PCR: NEGATIVE
SARS Coronavirus 2 by RT PCR: NEGATIVE

## 2021-04-14 LAB — MRSA NEXT GEN BY PCR, NASAL: MRSA by PCR Next Gen: NOT DETECTED

## 2021-04-14 LAB — PROTIME-INR
INR: 1.4 — ABNORMAL HIGH (ref 0.8–1.2)
Prothrombin Time: 17.1 seconds — ABNORMAL HIGH (ref 11.4–15.2)

## 2021-04-14 MED ORDER — GABAPENTIN 300 MG PO CAPS
300.0000 mg | ORAL_CAPSULE | Freq: Every day | ORAL | Status: DC
  Administered 2021-04-14 – 2021-04-16 (×3): 300 mg via ORAL
  Filled 2021-04-14 (×3): qty 1

## 2021-04-14 MED ORDER — LACTATED RINGERS IV BOLUS (SEPSIS)
1000.0000 mL | Freq: Once | INTRAVENOUS | Status: AC
  Administered 2021-04-14: 1000 mL via INTRAVENOUS

## 2021-04-14 MED ORDER — HYDROCORTISONE SOD SUC (PF) 100 MG IJ SOLR
100.0000 mg | Freq: Two times a day (BID) | INTRAMUSCULAR | Status: DC
  Administered 2021-04-14 – 2021-04-16 (×5): 100 mg via INTRAVENOUS
  Filled 2021-04-14 (×5): qty 2

## 2021-04-14 MED ORDER — ACETAMINOPHEN 650 MG RE SUPP: 650.0000 mg | Freq: Four times a day (QID) | RECTAL | Status: DC | PRN

## 2021-04-14 MED ORDER — ONDANSETRON HCL 4 MG PO TABS: 4.0000 mg | ORAL_TABLET | Freq: Four times a day (QID) | ORAL | Status: DC | PRN

## 2021-04-14 MED ORDER — HEPARIN SODIUM (PORCINE) 5000 UNIT/ML IJ SOLN
5000.0000 [IU] | Freq: Three times a day (TID) | INTRAMUSCULAR | Status: DC
  Administered 2021-04-14 – 2021-04-17 (×9): 5000 [IU] via SUBCUTANEOUS
  Filled 2021-04-14 (×9): qty 1

## 2021-04-14 MED ORDER — LACTATED RINGERS IV SOLN: INTRAVENOUS | Status: AC

## 2021-04-14 MED ORDER — FENTANYL CITRATE (PF) 100 MCG/2ML IJ SOLN
INTRAMUSCULAR | Status: AC
  Filled 2021-04-14: qty 2

## 2021-04-14 MED ORDER — FENTANYL CITRATE (PF) 100 MCG/2ML IJ SOLN
INTRAMUSCULAR | Status: AC | PRN
  Administered 2021-04-14 (×2): 25 ug via INTRAVENOUS
  Administered 2021-04-14 (×2): 50 ug via INTRAVENOUS

## 2021-04-14 MED ORDER — LORATADINE 10 MG PO TABS
10.0000 mg | ORAL_TABLET | Freq: Every day | ORAL | Status: DC
  Administered 2021-04-14 – 2021-04-15 (×2): 10 mg via ORAL
  Filled 2021-04-14 (×2): qty 1

## 2021-04-14 MED ORDER — MIDAZOLAM HCL 2 MG/2ML IJ SOLN
INTRAMUSCULAR | Status: AC
  Filled 2021-04-14: qty 2

## 2021-04-14 MED ORDER — IOHEXOL 300 MG/ML  SOLN
100.0000 mL | Freq: Once | INTRAMUSCULAR | Status: AC | PRN
  Administered 2021-04-14: 15 mL

## 2021-04-14 MED ORDER — MIDAZOLAM HCL 2 MG/2ML IJ SOLN
INTRAMUSCULAR | Status: AC | PRN
  Administered 2021-04-14: 1 mg via INTRAVENOUS
  Administered 2021-04-14 (×3): .5 mg via INTRAVENOUS

## 2021-04-14 MED ORDER — CHLORHEXIDINE GLUCONATE CLOTH 2 % EX PADS
6.0000 | MEDICATED_PAD | Freq: Every day | CUTANEOUS | Status: DC
  Administered 2021-04-14 – 2021-04-16 (×3): 6 via TOPICAL

## 2021-04-14 MED ORDER — ACETAMINOPHEN 500 MG PO TABS
1000.0000 mg | ORAL_TABLET | Freq: Once | ORAL | Status: AC
  Administered 2021-04-14: 1000 mg via ORAL
  Filled 2021-04-14: qty 2

## 2021-04-14 MED ORDER — SODIUM CHLORIDE 0.9% FLUSH
5.0000 mL | Freq: Two times a day (BID) | INTRAVENOUS | Status: DC
  Administered 2021-04-14 – 2021-04-17 (×7): 5 mL

## 2021-04-14 MED ORDER — MEPERIDINE HCL 100 MG/ML IJ SOLN
INTRAMUSCULAR | Status: AC
  Filled 2021-04-14: qty 1

## 2021-04-14 MED ORDER — LIDOCAINE HCL 1 % IJ SOLN
INTRAMUSCULAR | Status: AC
  Filled 2021-04-14: qty 20

## 2021-04-14 MED ORDER — LIDOCAINE HCL (PF) 1 % IJ SOLN
INTRAMUSCULAR | Status: AC | PRN
  Administered 2021-04-14: 10 mL

## 2021-04-14 MED ORDER — METHOCARBAMOL 1000 MG/10ML IJ SOLN
500.0000 mg | Freq: Three times a day (TID) | INTRAVENOUS | Status: DC | PRN
  Administered 2021-04-14: 500 mg via INTRAVENOUS
  Filled 2021-04-14: qty 500

## 2021-04-14 MED ORDER — ACETAMINOPHEN 325 MG PO TABS
650.0000 mg | ORAL_TABLET | Freq: Four times a day (QID) | ORAL | Status: DC | PRN
  Administered 2021-04-14 – 2021-04-15 (×3): 650 mg via ORAL
  Filled 2021-04-14 (×3): qty 2

## 2021-04-14 MED ORDER — MORPHINE SULFATE (PF) 2 MG/ML IV SOLN
2.0000 mg | INTRAVENOUS | Status: DC | PRN
  Administered 2021-04-14: 2 mg via INTRAVENOUS
  Filled 2021-04-14: qty 1

## 2021-04-14 MED ORDER — SILDENAFIL CITRATE 100 MG PO TABS: 100.0000 mg | ORAL_TABLET | Freq: Every day | ORAL | Status: DC | PRN

## 2021-04-14 MED ORDER — ONDANSETRON HCL 4 MG/2ML IJ SOLN: 4.0000 mg | Freq: Four times a day (QID) | INTRAMUSCULAR | Status: DC | PRN

## 2021-04-14 MED ORDER — SODIUM CHLORIDE 0.9 % IV SOLN
2.0000 g | INTRAVENOUS | Status: DC
  Administered 2021-04-14 – 2021-04-16 (×3): 2 g via INTRAVENOUS
  Filled 2021-04-14 (×3): qty 20

## 2021-04-14 NOTE — ED Triage Notes (Signed)
Pt c/o fever tonight pt dx yesterday with kidney stone. Pt took 800 ibuprofen at 0240 this morning.

## 2021-04-14 NOTE — Sedation Documentation (Signed)
Patient transported to ICU. Right flank assessed with receiving RN. No drainage from site. Clean, dry and intact.

## 2021-04-14 NOTE — ED Provider Notes (Signed)
[facility] EMERGENCY DEPARTMENT Provider Note   CSN: [id number] Arrival date & time: 04/14/21  0316     History Chief Complaint  Patient presents with   Fever    Mitchell Hughes is a 55 y.o. male.  Patient presents to the emergency department for evaluation of fever.  Patient reports that he has been running a fever on and off all day.  He took ibuprofen about an hour before coming to the ER.  Patient experiencing chills, sweats.  He was seen in the ED overnight last night and diagnosed with a kidney stone.  He continues to have waxing and waning right flank pain.      Past Medical History:  Diagnosis Date   Erectile dysfunction    Family history of colon cancer    sister in 52s   Family history of heart disease 2011   baseline cardiac eval 2011 with Dr. Sallyanne Kuster   H/O echocardiogram 2015   Dr. Wynonia Lawman   History of exercise stress test    04/2014 Dr. Wynonia Lawman, prior 2011 normal [place] treadmill stress test, Dr. Debara Pickett   LOW BACK PAIN, ACUTE 09/26/2009   RENAL CALCULUS, RIGHT 09/28/2009   9.21mm on CT   TOBACCO USE, QUIT 09/26/2009   Wears glasses     Patient Active Problem List   Diagnosis Date Noted   Sepsis ([place]) 04/14/2021   Bilateral hand numbness 02/14/2021   Localized swelling on right hand 02/14/2021   Sleep disturbance 02/14/2021   Snoring 02/14/2021   Erectile dysfunction 02/14/2021   Need for influenza vaccination 02/14/2021   Impingement syndrome of left shoulder 06/20/2020   Primary osteoarthritis of first carpometacarpal joint of left hand 06/20/2020   Encounter for health maintenance examination in adult 02/17/2020   Screening for prostate cancer 02/17/2020   History of COVID-19 02/17/2020   Toenail deformity 02/17/2020   Neuropathy of foot, right 02/17/2020   Chronic pain of both shoulders 04/22/2019   Decreased range of motion of shoulder 04/22/2019   Degenerative arthritis of thumb, left 04/22/2019   Right inguinal hernia 01/28/2019   Aortic valve  calcification 01/28/2019   Vaccine counseling 06/22/2017   Family history of heart disease 12/21/2013   Family history of colon cancer 12/21/2013    Past Surgical History:  Procedure Laterality Date   COLONOSCOPY  08/2017   tubular adenoma, repeat 5 years; Dr. Emery Cellar   FOOT SURGERY     bone spur, right; Dr. Milinda Pointer   WISDOM TOOTH EXTRACTION         Family History  Problem Relation Age of Onset   Thyroid disease Mother    Other Mother        brain disease, possibly dementia?   Gallstones Mother    Other Father        spinal cord injury/accident   Heart disease Father 2   Heart disease Sister 68       artificial valve   Colon cancer Sister    Heart disease Brother 62       MI   Diabetes Brother    Pancreatic cancer Brother    Cancer Sister 71       colon   Cancer Sister        pancreas   Pancreatic cancer Sister    Heart disease Sister    Heart disease Brother 70       pacemaker   Cancer Brother        liver, formerly pancreas   Heart disease Other  parent, other relative   Colon cancer Other    Stroke Neg Hx    Hypertension Neg Hx    Rectal cancer Neg Hx    Stomach cancer Neg Hx     Social History   Tobacco Use   Smoking status: Former    Packs/day: 0.25    Years: 30.00    Pack years: 7.50    Types: Cigarettes    Quit date: 03/05/2009    Years since quitting: 12.1   Smokeless tobacco: Never   Tobacco comments:    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 Authorizing Provider  gabapentin (NEURONTIN) 300 MG capsule Take 1 capsule (300 mg total) by mouth at bedtime. 02/21/21   Tysinger, Camelia Eng, PA-C  ibuprofen (ADVIL) 800 MG tablet Take 1  tablet (800 mg total) by mouth 3 (three) times daily. 04/13/21   Truddie Hidden, MD  oxyCODONE-acetaminophen (PERCOCET/ROXICET) 5-325 MG tablet Take 1 tablet by mouth every 6 (six) hours as needed for severe pain. 04/13/21   Truddie Hidden, MD  sildenafil (VIAGRA) 100 MG tablet Take 1 tablet (100 mg total) by mouth daily as needed for erectile dysfunction. 02/14/21   Tysinger, Camelia Eng, PA-C    Allergies    Patient has no known allergies.  Review of Systems   Review of Systems  Constitutional:  Positive for chills and fever.  Genitourinary:  Positive for flank pain.  All other systems reviewed and are negative.  Physical Exam Updated Vital Signs BP (!) 128/59   Pulse 82   Temp (S) (!) 104.2 F (40.1 C) (Rectal)   Resp (!) 25   Ht 6\' 5"  (1.956 m)   Wt 99.8 kg   SpO2 96%   BMI 26.09 kg/m   Physical Exam Vitals and nursing note reviewed.  Constitutional:      General: He is in acute distress.     Appearance: Normal appearance. He is well-developed. He is diaphoretic.  HENT:     Head: Normocephalic and atraumatic.     Right Ear: Hearing normal.     Left Ear: Hearing normal.     Nose: Nose normal.  Eyes:     Conjunctiva/sclera: Conjunctivae normal.     Pupils: Pupils are equal, round, and reactive to light.  Cardiovascular:     Rate and Rhythm: Regular rhythm. Tachycardia present.     Heart sounds: S1 normal and S2 normal. No murmur heard.   No friction rub. No gallop.  Pulmonary:     Effort: Pulmonary effort is normal. No respiratory distress.     Breath sounds: Normal breath sounds.  Chest:     Chest wall: No tenderness.  Abdominal:     General: Bowel sounds are normal.     Palpations: Abdomen is soft.     Tenderness: There is no abdominal tenderness. There is no guarding or rebound. Negative signs include Murphy's sign and McBurney's sign.     Hernia: No hernia is present.  Musculoskeletal:        General: Normal range of motion.     Cervical back: Normal  range of motion and neck  supple.  Skin:    General: Skin is warm.     Findings: No rash.  Neurological:     Mental Status: He is alert and oriented to person, place, and time.     GCS: GCS eye subscore is 4. GCS verbal subscore is 5. GCS motor subscore is 6.     Cranial Nerves: No cranial nerve deficit.     Sensory: No sensory deficit.     Coordination: Coordination normal.  Psychiatric:        Speech: Speech normal.        Behavior: Behavior normal.        Thought Content: Thought content normal.    ED Results / Procedures / Treatments   Labs (all labs ordered are listed, but only abnormal results are displayed) Labs Reviewed  LACTIC ACID, PLASMA - Abnormal; Notable for the following components:      Result Value   Lactic Acid, Venous 3.8 (*)    All other components within normal limits  COMPREHENSIVE METABOLIC PANEL - Abnormal; Notable for the following components:   CO2 21 (*)    Glucose, Bld 155 (*)    BUN 28 (*)    Creatinine, Ser 1.86 (*)    Total Bilirubin 2.8 (*)    GFR, Estimated 42 (*)    All other components within normal limits  CBC WITH DIFFERENTIAL/PLATELET - Abnormal; Notable for the following components:   WBC 12.2 (*)    Neutro Abs 10.9 (*)    Abs Immature Granulocytes 0.10 (*)    All other components within normal limits  PROTIME-INR - Abnormal; Notable for the following components:   Prothrombin Time 17.1 (*)    INR 1.4 (*)    All other components within normal limits  URINALYSIS, ROUTINE W REFLEX MICROSCOPIC - Abnormal; Notable for the following components:   Hgb urine dipstick MODERATE (*)    Protein, ur 30 (*)    Leukocytes,Ua MODERATE (*)    All other components within normal limits  URINALYSIS, MICROSCOPIC (REFLEX) - Abnormal; Notable for the following components:   Bacteria, UA FEW (*)    All other components within normal limits  RESP PANEL BY RT-PCR (FLU A&B, COVID) ARPGX2  CULTURE, BLOOD (ROUTINE X 2)  CULTURE, BLOOD (ROUTINE X 2)  URINE  CULTURE  LACTIC ACID, PLASMA  APTT    EKG EKG Interpretation  Date/Time:  Sunday April 14 2021 03:39:32 EST Ventricular Rate:  110 PR Interval:  173 QRS Duration: 89 QT Interval:  340 QTC Calculation: 460 R Axis:   -71 Text Interpretation: Sinus tachycardia Abnormal R-wave progression, early transition Inferior infarct, old Confirmed by Orpah Greek [phone]) on 04/14/2021 4:14:55 AM  Radiology CT ABDOMEN PELVIS W CONTRAST  Result Date: 04/13/2021 CLINICAL DATA:  Right lower quadrant pain EXAM: CT ABDOMEN AND PELVIS WITH CONTRAST TECHNIQUE: Multidetector CT imaging of the abdomen and pelvis was performed using the standard protocol following bolus administration of intravenous contrast. CONTRAST:  110mL OMNIPAQUE IOHEXOL 300 MG/ML  SOLN COMPARISON:  CT abdomen and pelvis 09/28/2009 FINDINGS: Lower chest: Mild subsegmental atelectasis in the lung bases. Hepatobiliary: Liver is normal in size and contour with no suspicious mass identified. Small hypodensity adjacent to the falciform ligament likely represents focal fatty infiltration. No gallstones, gallbladder wall thickening, or biliary dilatation. Pancreas: Unremarkable. No pancreatic ductal dilatation or surrounding inflammatory changes. Spleen: Enlarged measuring 14 cm in length. Adrenals/Urinary Tract: Adrenal glands appear normal. Urinary bladder appears within normal limits. Right kidney: Large calculus in  the lower pole of the right kidney measuring up to 1.5 x 1.5 x 2.6 cm in axial and craniocaudal dimensions. The stone appears to obstruct the lower pole calices which are mild-to-moderately distended. There is relatively heterogeneous and diminished perfusion in the lower pole of the right kidney with adjacent mild fat stranding/fluid. No dilatation of the renal pelvis or ureter. 2.3 cm simple appearing exophytic cyst in the upper pole. Left kidney: No nephrolithiasis or hydronephrosis. There is a smoothly marginated partially  exophytic mass at the medial aspect of the kidney which demonstrates inhomogeneous apparent mild diffuse enhancement and measures up to 3.9 x 6.5 x 6.3 cm in axial and craniocaudal dimensions, and is new since previous study. Stomach/Bowel: No bowel obstruction, free air or pneumatosis. No bowel wall edema. Appendix is normal. Vascular/Lymphatic: No bulky lymphadenopathy identified. Mild atherosclerotic disease. Reproductive: Prostate gland is enlarged. Other: No ascites. Small umbilical hernia containing fat. Small right inguinal hernia containing fat. Musculoskeletal: No suspicious bony lesions. IMPRESSION: 1. Large calculus in the lower pole of the right kidney causing obstruction of the lower pole calices. Mild perinephric fat stranding/fluid at the lower pole right kidney may be reactive and/or secondary to caliceal rupture. Also correlation for possible superimposed infection recommended. 2. Solid-appearing exophytic mass at the medial aspect of the left kidney which appears to demonstrate mild diffuse enhancement, concerning for malignancy. Consider follow-up with ultrasound and renal mass protocol MRI, and urology consultation is recommended. 3. Prostatomegaly. 4. Splenomegaly. Electronically Signed   By: Ofilia Neas M.D.   On: 04/13/2021 08:40   DG Chest Port 1 View  Result Date: 04/14/2021 CLINICAL DATA:  55 year old male with possible sepsis. EXAM: PORTABLE CHEST 1 VIEW COMPARISON:  Chest radiographs 03/23/2014 and earlier. FINDINGS: Portable AP upright view at 0410 hours. Normal lung volumes and mediastinal contours. Visualized tracheal air column is within normal limits. Allowing for portable technique the lungs are clear. No pneumothorax or pleural effusion. No acute osseous abnormality identified. IMPRESSION: Negative portable chest. Electronically Signed   By: Genevie Ann M.D.   On: 04/14/2021 04:29    Procedures Procedures   Medications Ordered in ED Medications  lactated ringers  infusion ( Intravenous New Bag/Given 04/14/21 0402)  cefTRIAXone (ROCEPHIN) 2 g in sodium chloride 0.9 % 100 mL IVPB (0 g Intravenous Stopped 04/14/21 0437)  lactated ringers bolus 1,000 mL (0 mLs Intravenous Stopped 04/14/21 0436)    And  lactated ringers bolus 1,000 mL (0 mLs Intravenous Stopped 04/14/21 0436)    And  lactated ringers bolus 1,000 mL (0 mLs Intravenous Stopped 04/14/21 0436)  acetaminophen (TYLENOL) tablet 1,000 mg (1,000 mg Oral Given 04/14/21 0551)    ED Course  I have reviewed the triage vital signs and the nursing notes.  Pertinent labs & imaging results that were available during my care of the patient were reviewed by me and considered in my medical decision making (see chart for details).    MDM Rules/Calculators/A&P                           Patient appears somewhat ill at arrival.  He is diaphoretic and febrile.  Patient tachycardic and mildly hypotensive.  He was seen in the ED overnight and diagnosed with a kidney stone.  He did not have signs of infection at that time.  Patient running a fever intermittently today.  With his vital signs, sepsis was considered likely.  He was initiated on treatment for urologic source.  Lactic acid is elevated at 3.8.  He has responded to IV fluid bolus, heart rate is improved, blood pressure is now normotensive.  Urinalysis with some white cells but not overtly consistent with infection.  This, however, must be considered the source of the infection.  Discussed with Dr. Tresa Moore, he will place a stent.  Recommends transfer to [place] [facility] to facilitate this procedure.  Remainder of work-up is unremarkable.  No other source for the fever noted.  Discussed with hospitalist service, orders for admission to [facility] have been placed.  Discussed with Dr. Gilford Raid, ER physician at [facility].  There are no beds currently, patient requires urgent transfer for urologic intervention.  Will be transferred ED to ED.  CRITICAL  CARE Performed by: Orpah Greek   Total critical care time: 35 minutes  Critical care time was exclusive of separately billable procedures and treating other patients.  Critical care was necessary to treat or prevent imminent or life-threatening deterioration.  Critical care was time spent personally by me on the following activities: development of treatment plan with patient and/or surrogate as well as nursing, discussions with consultants, evaluation of patient's response to treatment, examination of patient, obtaining history from patient or surrogate, ordering and performing treatments and interventions, ordering and review of laboratory studies, ordering and review of radiographic studies, pulse oximetry and re-evaluation of patient's condition.   Final Clinical Impression(s) / ED Diagnoses Final diagnoses:  Sepsis, due to unspecified organism, unspecified whether acute organ dysfunction present [facility])  Kidney stone    Rx / DC Orders ED Discharge Orders     None        Marrissa Dai, Gwenyth Allegra, MD 04/14/21 [phone]

## 2021-04-14 NOTE — Sepsis Progress Note (Signed)
Following for sepsis monitoring ?

## 2021-04-14 NOTE — H&P (Signed)
TRH H&P    Patient Demographics:    Mitchell Hughes, is a 55 y.o. male  MRN: [id number]  DOB - 10-Jul-1965  Admit Date - 04/14/2021  Referring MD/NP/PA: Betsey Holiday  Outpatient Primary MD for the patient is Tysinger, Camelia Eng, PA-C  Patient coming from: home  Chief complaint- Fever   HPI:    Mitchell Hughes  is a 55 y.o. male, with history of nephrolithiasis, presents the ED with a chief complaint of fever.  Patient reports that he started having fever and chills during the day.  He had associated rigors.  He reports that the day prior to today's presentation, he presented to the ED for right lower quadrant pain.  At that time he thought he had appendicitis.  The pain is started around 3 AM on the 10th.  He took something for gas, but the pain continued to get worse.  It felt like a "balloon about to burst."  Patient had no dysuria.  His urine Has been dark for 2 days.  He had been constipated for several days as well.  Its normal for him to go twice a day, and he had not gone since the eighth.  He reports he had sudden onset of decrease in urine output as well.  When he was in the ER previously he had a CT scan that showed a nephrolithiasis.  His urine did not look infected so he was discharged home with hydrocodone and ibuprofen.  He was supposed to follow-up with urology I believe.  He reports that the hydrocodone did help.  He took his last ibuprofen dose at 240 in the morning prior to presentation in the ED.  Even with the high-dose ibuprofen he was having fevers and chills.  Patient also has associated weakness and fatigue.  He had some nausea but did not vomit.  Patient has no other complaints at this time.  In the ED Temp 104.2, heart rate 56-112, respiratory rate 16-25, blood pressure 128/59 but as low as 94/52, satting at 90's on room air Patient is a leukocytosis of 12.2, hemoglobin 15.2 Is an AKI with a creatinine of  1.6, bicarb 21 Lactic acid is 3.8 Is negative COVID and flu UA shows hematuria, few bacteria, and white blood cells 11-20 Urine culture pending Chest x-ray was negative for acute findings Blood cultures pending CT abdomen from the previous presentation to the ER shows a large calculus in the lower pole of the right kidney causing obstruction of the lower pole calyces.  Mild perinephric fat stranding or fluid at the lower pole kidney may be reactive or secondary to calyceal rupture.  Also correlation for possible superimposed infection recommended.  There is a solid-appearing exophytic mass on the medial aspect of the left kidney which appears to demonstrate mild diffuse enhancement concerning for malignancy.  Follow-up ultrasound and renal mass protocol MRI as well as urology consultation recommended In the ED patient was given 3 L bolus Rocephin 2 g and LR 150 MLS per hour.  Blood pressure did improve with the bolus.  Review of systems:    In addition to the HPI above,  Admits to fever and chills No Headache, No changes with Vision or hearing, No problems swallowing food or Liquids, No Chest pain, Cough or Shortness of Breath, Admits to abdominal pain, flank pain, nausea, no vomiting, constipation No Blood in stool, admits to dark-colored urine No dysuria, No new skin rashes or bruises, No new joints pains-aches,  No new weakness, tingling, numbness in any extremity, No recent weight gain or loss, No polyuria, polydypsia or polyphagia, No significant Mental Stressors.  All other systems reviewed and are negative.    Past History of the following :    Past Medical History:  Diagnosis Date   Erectile dysfunction    Family history of colon cancer    sister in 45s   Family history of heart disease 2011   baseline cardiac eval 2011 with Dr. Sallyanne Kuster   H/O echocardiogram 2015   Dr. Wynonia Lawman   History of exercise stress test    04/2014 Dr. Wynonia Lawman, prior 2011 normal [place]  treadmill stress test, Dr. Debara Pickett   LOW BACK PAIN, ACUTE 09/26/2009   RENAL CALCULUS, RIGHT 09/28/2009   9.84mm on CT   TOBACCO USE, QUIT 09/26/2009   Wears glasses       Past Surgical History:  Procedure Laterality Date   COLONOSCOPY  08/2017   tubular adenoma, repeat 5 years; Dr. Pontoosuc Cellar   FOOT SURGERY     bone spur, right; Dr. 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   Gallstones Mother    Other Father        spinal cord injury/accident   Heart disease Father 73   Heart disease Sister 76       artificial valve   Colon cancer Sister    Heart disease Brother 50       MI   Diabetes Brother    Pancreatic cancer Brother    Cancer Sister 67       colon   Cancer Sister        pancreas   Pancreatic cancer Sister    Heart disease Sister    Heart disease Brother 7       pacemaker   Cancer Brother        liver, formerly pancreas   Heart disease Other        parent, other relative   Colon cancer Other    Stroke Neg Hx    Hypertension Neg Hx    Rectal cancer Neg Hx    Stomach cancer Neg Hx       Home Medications:  Prior to Admission medications   Medication Sig Start Date End Date Taking? Authorizing Provider  gabapentin (NEURONTIN) 300 MG capsule Take 1 capsule (300 mg total) by mouth at bedtime. 02/21/21   Tysinger, Camelia Eng, PA-C  ibuprofen (ADVIL)  800 MG tablet Take 1 tablet (800 mg total) by mouth 3 (three) times daily. 04/13/21   Truddie Hidden, MD  oxyCODONE-acetaminophen (PERCOCET/ROXICET) 5-325 MG tablet Take 1 tablet by mouth every 6 (six) hours as needed for severe pain. 04/13/21   Truddie Hidden, MD  sildenafil (VIAGRA) 100 MG tablet Take 1 tablet (100 mg total) by mouth daily as needed for erectile dysfunction. 02/14/21   Tysinger, Camelia Eng, PA-C     Allergies:    No Known Allergies   Physical Exam:   Vitals  Blood pressure (!) 128/59, pulse 82, temperature (S) (!) 104.2 F (40.1 C), temperature source Rectal, resp. rate (!) 25, height 6\' 5"  (1.956 m), weight 99.8 kg, SpO2 96 %.   1.  General: Patient lying supine in bed,  no acute distress   2. Psychiatric: Alert and oriented x 3, mood and behavior normal for situation, pleasant and cooperative with exam   3. Neurologic: Speech and language are normal, face is symmetric, moves all 4 extremities voluntarily, at baseline without acute deficits on limited exam   4. HEENMT:  Head is atraumatic, normocephalic, pupils reactive to light, neck is supple, trachea is midline, mucous membranes are moist   5. Respiratory : Lungs are clear to auscultation bilaterally without wheezing, rhonchi, rales, no cyanosis, no increase in work of breathing or accessory muscle use   6. Cardiovascular : Heart rate tachycardic, rhythm is regular, no murmurs, rubs or gallops, no peripheral edema, peripheral pulses palpated   7. Gastrointestinal:  Abdomen is soft, nondistended, tender to palpation in the right lower quadrant with voluntary guarding,  no masses or organomegaly palpated   8. Skin:  Skin is warm, diaphoretic and intact without rashes, acute lesions, or ulcers on limited exam   9.Musculoskeletal:  No acute deformities or trauma, no asymmetry in tone, no peripheral edema, peripheral pulses palpated, no tenderness to palpation in the extremities     Data Review:     CBC Recent Labs  Lab 04/13/21 0549 04/14/21 0354  WBC 13.1* 12.2*  HGB 15.9 15.2  HCT 44.3 44.1  PLT 245 182  MCV 86.9 89.1  MCH 31.2 30.7  MCHC 35.9 34.5  RDW 12.0 12.7  LYMPHSABS 1.0 1.0  MONOABS 0.4 0.2  EOSABS 0.1 0.0  BASOSABS 0.0 0.1   ------------------------------------------------------------------------------------------------------------------  Results for orders placed or performed during the hospital encounter of 04/14/21 (from the past 48 hour(s))  Lactic acid, plasma     Status: Abnormal   Collection Time: 04/14/21  3:54 AM  Result Value Ref Range   Lactic Acid, Venous 3.8 (HH) 0.5 - 1.9 mmol/L    Comment: CRITICAL RESULT CALLED TO, READ BACK BY AND VERIFIED WITH: GALLOWAY,B @ 0433 ON 04/14/21 BY JUW Performed at [facility], [address] [place], [place] [address]   Comprehensive metabolic panel     Status: Abnormal   Collection Time: 04/14/21  3:54 AM  Result Value Ref Range   Sodium 137 135 - 145 mmol/L   Potassium 3.8 3.5 - 5.1 mmol/L   Chloride 105 98 - 111 mmol/L   CO2 21 (L) 22 - 32 mmol/L   Glucose, Bld 155 (H) 70 - 99 mg/dL    Comment: Glucose reference range applies  only to samples taken after fasting for at least 8 hours.   BUN 28 (H) 6 - 20 mg/dL   Creatinine, Ser 1.86 (H) 0.61 - 1.24 mg/dL   Calcium 9.2 8.9 - 10.3 mg/dL   Total Protein 7.5 6.5 - 8.1 g/dL   Albumin 4.2 3.5 - 5.0 g/dL   AST 35 15 - 41 U/L   ALT 28 0 - 44 U/L   Alkaline Phosphatase 58 38 - 126 U/L   Total Bilirubin 2.8 (H) 0.3 - 1.2 mg/dL   GFR, Estimated 42 (L) >60 mL/min    Comment: (NOTE) Calculated using the CKD-EPI Creatinine Equation (2021)    Anion gap 11 5 - 15    Comment: Performed at [facility], [address] [place], [place] [address]  CBC WITH DIFFERENTIAL     Status: Abnormal   Collection Time: 04/14/21  3:54 AM  Result Value Ref Range   WBC 12.2 (H) 4.0 - 10.5 K/uL   RBC 4.95 4.22 - 5.81 MIL/uL   Hemoglobin 15.2 13.0 - 17.0 g/dL   HCT 44.1  39.0 - 52.0 %   MCV 89.1 80.0 - 100.0 fL   MCH 30.7 26.0 - 34.0 pg   MCHC 34.5 30.0 - 36.0 g/dL   RDW 12.7 11.5 - 15.5 %   Platelets 182 150 - 400 K/uL   nRBC 0.0 0.0 - 0.2 %   Neutrophils Relative % 90 %   Neutro Abs 10.9 (H) 1.7 - 7.7 K/uL   Lymphocytes Relative 8 %   Lymphs Abs 1.0 0.7 - 4.0 K/uL   Monocytes Relative 1 %   Monocytes Absolute 0.2 0.1 - 1.0 K/uL   Eosinophils Relative 0 %   Eosinophils Absolute 0.0 0.0 - 0.5 K/uL   Basophils Relative 0 %   Basophils Absolute 0.1 0.0 - 0.1 K/uL   Immature Granulocytes 1 %   Abs Immature Granulocytes 0.10 (H) 0.00 - 0.07 K/uL    Comment: Performed at [facility], [address], [place], [place] [address]  Protime-INR     Status: Abnormal   Collection Time: 04/14/21  3:54 AM  Result Value Ref Range   Prothrombin Time 17.1 (H) 11.4 - 15.2 seconds   INR 1.4 (H) 0.8 - 1.2    Comment: (NOTE) INR goal varies based on device and disease states. Performed at [facility], [address], [place], [place] [address]   APTT     Status: None   Collection Time: 04/14/21  3:54 AM  Result Value Ref Range   aPTT 28 24 - 36 seconds    Comment: Performed at [facility], [address], [place], [place] [address]  Blood Culture (routine x 2)     Status: None (Preliminary result)   Collection Time: 04/14/21  3:55 AM   Specimen: Blood  Result Value Ref Range   Specimen Description RIGHT ANTECUBITAL    Special Requests      BOTTLES DRAWN AEROBIC AND ANAEROBIC Blood Culture adequate volume Performed at [facility], [address] [place],  [address]    Culture PENDING    Report Status PENDING   Blood Culture (routine x 2)     Status: None (Preliminary result)   Collection Time: 04/14/21  3:57 AM   Specimen: Blood  Result Value Ref Range   Specimen Description LEFT ANTECUBITAL    Special Requests      BOTTLES DRAWN AEROBIC AND ANAEROBIC Blood Culture adequate volume Performed at [facility], [address],  [place], [place]  [address]    Culture PENDING    Report Status PENDING   Resp Panel by RT-PCR (Flu A&B, Covid) Nasopharyngeal Swab     Status: None   Collection Time: 04/14/21  4:03 AM   Specimen: Nasopharyngeal Swab; Nasopharyngeal(NP) swabs in vial transport medium  Result Value Ref Range   SARS Coronavirus 2 by RT PCR NEGATIVE NEGATIVE    Comment: (NOTE) SARS-CoV-2 target nucleic acids are NOT DETECTED.  The SARS-CoV-2 RNA is generally detectable in upper respiratory specimens during the acute phase of infection. The lowest concentration of SARS-CoV-2 viral copies this assay can detect is 138 copies/mL. A negative result does not preclude SARS-Cov-2 infection and should not be used as the sole basis for treatment or other patient management decisions. A negative result may occur with  improper specimen collection/handling, submission of specimen other than nasopharyngeal swab, presence of viral mutation(s) within the areas targeted by this assay, and inadequate number of viral copies(<138 copies/mL). A negative result must be combined with clinical observations, patient history, and epidemiological information. The expected result is Negative.  Fact Sheet for Patients:  EntrepreneurPulse.com.au  Fact Sheet for Healthcare Providers:  IncredibleEmployment.be  This test is no t yet approved or cleared by the [place] FDA and  has been authorized for detection and/or diagnosis of SARS-CoV-2 by FDA under an Emergency Use Authorization (EUA). This EUA will remain  in effect (meaning this test can be used) for the duration of the COVID-19 declaration under Section 564(b)(1) of the Act, 21 U.S.C.section 360bbb-3(b)(1), unless the authorization is terminated  or revoked sooner.       Influenza A by PCR NEGATIVE NEGATIVE   Influenza B by PCR NEGATIVE NEGATIVE    Comment: (NOTE) The Xpert Xpress SARS-CoV-2/FLU/RSV plus assay is intended as an aid in the  diagnosis of influenza from Nasopharyngeal swab specimens and should not be used as a sole basis for treatment. Nasal washings and aspirates are unacceptable for Xpert Xpress SARS-CoV-2/FLU/RSV testing.  Fact Sheet for Patients: EntrepreneurPulse.com.au  Fact Sheet for Healthcare Providers: IncredibleEmployment.be  This test is not yet approved or cleared by the [place] FDA and has been authorized for detection and/or diagnosis of SARS-CoV-2 by FDA under an Emergency Use Authorization (EUA). This EUA will remain in effect (meaning this test can be used) for the duration of the COVID-19 declaration under Section 564(b)(1) of the Act, 21 U.S.C. section 360bbb-3(b)(1), unless the authorization is terminated or revoked.  Performed at [facility], [address], [place], [place] [address]   Urinalysis, Routine w reflex microscopic Urine, Clean Catch     Status: Abnormal   Collection Time: 04/14/21  4:41 AM  Result Value Ref Range   Color, Urine YELLOW YELLOW   APPearance CLEAR CLEAR   Specific Gravity, Urine 1.020 1.005 - 1.030   pH 6.0 5.0 - 8.0   Glucose, UA NEGATIVE NEGATIVE mg/dL   Hgb urine dipstick MODERATE (A) NEGATIVE   Bilirubin Urine NEGATIVE NEGATIVE   Ketones, ur NEGATIVE NEGATIVE mg/dL   Protein, ur 30 (A) NEGATIVE mg/dL   Nitrite NEGATIVE NEGATIVE   Leukocytes,Ua MODERATE (A) NEGATIVE    Comment: Performed at [facility], [address], [place],  [address]  Urinalysis, Microscopic (reflex)     Status: Abnormal   Collection Time: 04/14/21  4:41 AM  Result Value Ref Range   RBC / HPF 0-5 0 - 5 RBC/hpf   WBC, UA 11-20 0 - 5 WBC/hpf   Bacteria, UA FEW (  A) NONE SEEN   Squamous Epithelial / LPF 0-5 0 - 5    Comment: Performed at [facility], [address] [place], [place] [address]  Lactic acid, plasma     Status: None   Collection Time: 04/14/21  5:32 AM  Result Value Ref Range   Lactic Acid, Venous 1.5 0.5 - 1.9  mmol/L    Comment: Performed at [facility], [address], [place], [place] [address]    Chemistries  Recent Labs  Lab 04/13/21 0549 04/14/21 0354  NA 136 137  K 4.0 3.8  CL 107 105  CO2 19* 21*  GLUCOSE 122* 155*  BUN 18 28*  CREATININE 1.19 1.86*  CALCIUM 9.5 9.2  AST 24 35  ALT 24 28  ALKPHOS 59 58  BILITOT 0.5 2.8*   ------------------------------------------------------------------------------------------------------------------  ------------------------------------------------------------------------------------------------------------------ GFR: Estimated Creatinine Clearance: 55.2 mL/min (A) (by C-G formula based on SCr of 1.86 mg/dL (H)). Liver Function Tests: Recent Labs  Lab 04/13/21 0549 04/14/21 0354  AST 24 35  ALT 24 28  ALKPHOS 59 58  BILITOT 0.5 2.8*  PROT 7.4 7.5  ALBUMIN 4.5 4.2   No results for input(s): LIPASE, AMYLASE in the last 168 hours. No results for input(s): AMMONIA in the last 168 hours. Coagulation Profile: Recent Labs  Lab 04/14/21 0354  INR 1.4*   Cardiac Enzymes: No results for input(s): CKTOTAL, CKMB, CKMBINDEX, TROPONINI in the last 168 hours. BNP (last 3 results) No results for input(s): PROBNP in the last 8760 hours. HbA1C: No results for input(s): HGBA1C in the last 72 hours. CBG: No results for input(s): GLUCAP in the last 168 hours. Lipid Profile: No results for input(s): CHOL, HDL, LDLCALC, TRIG, CHOLHDL, LDLDIRECT in the last 72 hours. Thyroid Function Tests: No results for input(s): TSH, T4TOTAL, FREET4, T3FREE, THYROIDAB in the last 72 hours. Anemia Panel: No results for input(s): VITAMINB12, FOLATE, FERRITIN, TIBC, IRON, RETICCTPCT in the last 72 hours.  --------------------------------------------------------------------------------------------------------------- Urine analysis:    Component Value Date/Time   COLORURINE YELLOW 04/14/2021 0441   APPEARANCEUR CLEAR 04/14/2021 0441   LABSPEC 1.020  04/14/2021 0441   LABSPEC 1.030 06/22/2017 1118   PHURINE 6.0 04/14/2021 0441   GLUCOSEU NEGATIVE 04/14/2021 0441   GLUCOSEU NEGATIVE 10/05/2009 1113   HGBUR MODERATE (A) 04/14/2021 0441   BILIRUBINUR NEGATIVE 04/14/2021 0441   BILIRUBINUR negative 06/22/2017 1118   KETONESUR NEGATIVE 04/14/2021 0441   PROTEINUR 30 (A) 04/14/2021 0441   UROBILINOGEN 0.2 10/05/2009 1113   NITRITE NEGATIVE 04/14/2021 0441   LEUKOCYTESUR MODERATE (A) 04/14/2021 0441      Imaging Results:    CT ABDOMEN PELVIS W CONTRAST  Result Date: 04/13/2021 CLINICAL DATA:  Right lower quadrant pain EXAM: CT ABDOMEN AND PELVIS WITH CONTRAST TECHNIQUE: Multidetector CT imaging of the abdomen and pelvis was performed using the standard protocol following bolus administration of intravenous contrast. CONTRAST:  174mL OMNIPAQUE IOHEXOL 300 MG/ML  SOLN COMPARISON:  CT abdomen and pelvis 09/28/2009 FINDINGS: Lower chest: Mild subsegmental atelectasis in the lung bases. Hepatobiliary: Liver is normal in size and contour with no suspicious mass identified. Small hypodensity adjacent to the falciform ligament likely represents focal fatty infiltration. No gallstones, gallbladder wall thickening, or biliary dilatation. Pancreas: Unremarkable. No pancreatic ductal dilatation or surrounding inflammatory changes. Spleen: Enlarged measuring 14 cm in length. Adrenals/Urinary Tract: Adrenal glands appear normal. Urinary bladder appears within normal limits. Right kidney: Large calculus in the lower pole of the right kidney measuring up to 1.5 x 1.5 x 2.6 cm  in axial and craniocaudal dimensions. The stone appears to obstruct the lower pole calices which are mild-to-moderately distended. There is relatively heterogeneous and diminished perfusion in the lower pole of the right kidney with adjacent mild fat stranding/fluid. No dilatation of the renal pelvis or ureter. 2.3 cm simple appearing exophytic cyst in the upper pole. Left kidney: No  nephrolithiasis or hydronephrosis. There is a smoothly marginated partially exophytic mass at the medial aspect of the kidney which demonstrates inhomogeneous apparent mild diffuse enhancement and measures up to 3.9 x 6.5 x 6.3 cm in axial and craniocaudal dimensions, and is new since previous study. Stomach/Bowel: No bowel obstruction, free air or pneumatosis. No bowel wall edema. Appendix is normal. Vascular/Lymphatic: No bulky lymphadenopathy identified. Mild atherosclerotic disease. Reproductive: Prostate gland is enlarged. Other: No ascites. Small umbilical hernia containing fat. Small right inguinal hernia containing fat. Musculoskeletal: No suspicious bony lesions. IMPRESSION: 1. Large calculus in the lower pole of the right kidney causing obstruction of the lower pole calices. Mild perinephric fat stranding/fluid at the lower pole right kidney may be reactive and/or secondary to caliceal rupture. Also correlation for possible superimposed infection recommended. 2. Solid-appearing exophytic mass at the medial aspect of the left kidney which appears to demonstrate mild diffuse enhancement, concerning for malignancy. Consider follow-up with ultrasound and renal mass protocol MRI, and urology consultation is recommended. 3. Prostatomegaly. 4. Splenomegaly. Electronically Signed   By: Ofilia Neas M.D.   On: 04/13/2021 08:40   DG Chest Port 1 View  Result Date: 04/14/2021 CLINICAL DATA:  55 year old male with possible sepsis. EXAM: PORTABLE CHEST 1 VIEW COMPARISON:  Chest radiographs 03/23/2014 and earlier. FINDINGS: Portable AP upright view at 0410 hours. Normal lung volumes and mediastinal contours. Visualized tracheal air column is within normal limits. Allowing for portable technique the lungs are clear. No pneumothorax or pleural effusion. No acute osseous abnormality identified. IMPRESSION: Negative portable chest. Electronically Signed   By: Genevie Ann M.D.   On: 04/14/2021 04:29      Assessment  & Plan:    Principal Problem:   Sepsis ([place])   Sepsis Sepsis secondary to UTI/infected nephrolithiasis SIRS criteria temperature 104.2, heart rate 112, respiratory rate 25, white blood cell count 12.2 Life threatening organ dysfunction 2/2 infection is evidenced by: AKI and lactic acidosis and elevated total bili Antibiotics started Rocephin Fluids given prior to admission 3 L Hypotension corrected with bolus Sepsis order set utilized Monitor this patient on telemetry level of care UTI With infected nephrolithiasis Urine culture pending Continue 2 g Rocephin daily Mass on CT Mass seen on CT with radiology recommending renal ultrasound and urology consultation Urology is already consulted and will see patient at [facility] Renal ultrasound ordered Nephrolithiasis Will likely need stent Urology consulted ED to ED transfer to get patient to [facility] AKI Multifactorial Patient prescribed 800 mg ibuprofen and has been taking that scheduled-likely contributing to the AKI Intermittently obstructing nephrolithiasis also contributing to AKI Decreased p.o. intake with dehydration also contributing to AKI Continue fluid resuscitation Hold nephrotoxic agents when possible Continue to monitor  DVT Prophylaxis-   Heparin - SCDs   AM Labs Ordered, also please review Full Orders  Family Communication: Admission, patients condition and plan of care including tests being ordered have been discussed with the patient and partner Simona Huh who indicate understanding and agree with the plan and Code Status.  Code Status: Full  Admission status: npatient :The appropriate admission status for this patient is INPATIENT. Inpatient status is judged to be  reasonable and necessary in order to provide the required intensity of service to ensure the patient's safety. The patient's presenting symptoms, physical exam findings, and initial radiographic and laboratory data in the context of their chronic  comorbidities is felt to place them at high risk for further clinical deterioration. Furthermore, it is not anticipated that the patient will be medically stable for discharge from the hospital within 2 midnights of admission. The following factors support the admission status of inpatient.     The patient's presenting symptoms include fever. The worrisome physical exam findings include SIRS criteria. The initial radiographic and laboratory data are worrisome because of nephrolithiasis, AKI, UTI        * I certify that at the point of admission it is my clinical judgment that the patient will require inpatient hospital care spanning beyond 2 midnights from the point of admission due to high intensity of service, high risk for further deterioration and high frequency of surveillance required.*  Disposition: Anticipated Discharge date 48-72 hours discharge to home  Time spent in minutes : Nick[place] DO

## 2021-04-14 NOTE — Care Plan (Signed)
This 55 years old male with PMH significant for nephrolithiasis presented to the ED with complaints of right lower quadrant abdominal pain associated with nausea vomiting and fever. CT abdomen and pelvis shows large calculus in the lower pole of right kidney causing obstruction and possible superimposed infection.  There is also a solid-appearing exophytic mass in the medial aspect of left kidney demonstrate diffuse enhancement concerning for malignancy. Patient is admitted for sepsis secondary to UTI /infected nephrolithiasis.  Patient started on empiric antibiotics,  IV Rocephin, IV hydration.  Urology is consulted.  Patient is transferred from [facility] to [facility].  Patient was seen by urology,  recommended percutaneous nephrostomy tubes by IR.  Continue IV hydration,  monitor serum creatinine.  Patient was seen and examined.

## 2021-04-14 NOTE — Consult Note (Addendum)
Chief Complaint: Patient was seen in consultation today for right percutaneous nephrostomy Chief Complaint  Patient presents with   Fever    Referring Physician(s): Manny,T  Supervising Physician: Michaelle Birks  Patient Status: [facility] - In-pt  History of Present Illness: Mitchell Hughes is a 55 y.o. male ,ex-smoker, with history of nephrolithiasis who recently presented to [facility] ED with fever, chills, rigors, weakness, nausea, abdominal/back discomfort.  CT of the abdomen and pelvis yesterday revealed:  1. Large calculus in the lower pole of the right kidney causing obstruction of the lower pole calices. Mild perinephric fat stranding/fluid at the lower pole right kidney may be reactive and/or secondary to caliceal rupture. Also correlation for possible superimposed infection recommended. 2. Solid-appearing exophytic mass at the medial aspect of the left kidney which appears to demonstrate mild diffuse enhancement, concerning for malignancy. Consider follow-up with ultrasound and renal mass protocol MRI, and urology consultation is recommended. 3. Prostatomegaly. 4. Splenomegaly.   He is currently afebrile, lactic acid normal, urine culture pending, blood culture negative to date, COVID-19/flu negative WBC 12.2, hemoglobin/platelets normal, creatinine 1.86, PT/INR 17.1/1.4; pt on IV rocephin; urology has now requested right percutaneous nephrostomy.   Past Medical History:  Diagnosis Date   Erectile dysfunction    Family history of colon cancer    sister in 59s   Family history of heart disease 2011   baseline cardiac eval 2011 with Dr. Sallyanne Kuster   H/O echocardiogram 2015   Dr. Wynonia Lawman   History of exercise stress test    04/2014 Dr. Wynonia Lawman, prior 2011 normal Bruce treadmill stress test, Dr. Debara Pickett   LOW BACK PAIN, ACUTE 09/26/2009   RENAL CALCULUS, RIGHT 09/28/2009   9.57mm on CT   TOBACCO USE, QUIT 09/26/2009   Wears glasses     Past Surgical History:  Procedure  Laterality Date   COLONOSCOPY  08/2017   tubular adenoma, repeat 5 years; Dr. Jugtown Cellar   FOOT SURGERY     bone spur, right; Dr. Milinda Pointer   WISDOM TOOTH EXTRACTION      Allergies: Patient has no known allergies.  Medications: Prior to Admission medications   Medication Sig Start Date End Date Taking? Authorizing Provider  gabapentin (NEURONTIN) 300 MG capsule Take 1 capsule (300 mg total) by mouth at bedtime. 02/21/21   Tysinger, Camelia Eng, PA-C  ibuprofen (ADVIL) 800 MG tablet Take 1 tablet (800 mg total) by mouth 3 (three) times daily. 04/13/21   Truddie Hidden, MD  oxyCODONE-acetaminophen (PERCOCET/ROXICET) 5-325 MG tablet Take 1 tablet by mouth every 6 (six) hours as needed for severe pain. 04/13/21   Truddie Hidden, MD  sildenafil (VIAGRA) 100 MG tablet Take 1 tablet (100 mg total) by mouth daily as needed for erectile dysfunction. 02/14/21   Tysinger, Camelia Eng, PA-C     Family History  Problem Relation Age of Onset   Thyroid disease Mother    Other Mother        brain disease, possibly dementia?   Gallstones Mother    Other Father        spinal cord injury/accident   Heart disease Father 51   Heart disease Sister 77       artificial valve   Colon cancer Sister    Heart disease Brother 22       MI   Diabetes Brother    Pancreatic cancer Brother    Cancer Sister 68       colon   Cancer Sister  pancreas   Pancreatic cancer Sister    Heart disease Sister    Heart disease Brother 83       pacemaker   Cancer Brother        liver, formerly pancreas   Heart disease Other        parent, other relative   Colon cancer Other    Stroke Neg Hx    Hypertension Neg Hx    Rectal cancer Neg Hx    Stomach cancer Neg Hx     Social History   Socioeconomic History   Marital status: Single    Spouse name: Not on file   Number of children: Not on file   Years of education: Not on file   Highest education level: Not on file  Occupational History   Not on  file  Tobacco Use   Smoking status: Former    Packs/day: 0.25    Years: 30.00    Pack years: 7.50    Types: Cigarettes    Quit date: 03/05/2009    Years since quitting: 12.1   Smokeless tobacco: Never   Tobacco comments:    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  01/2019   Social Determinants of Health   Financial Resource Strain: Not on file  Food Insecurity: Not on file  Transportation Needs: Not on file  Physical Activity: Not on file  Stress: Not on file  Social Connections: Not on file      Review of Systems see above, headache, chest pain, worsening dyspnea, cough, vomiting or bleeding  Vital Signs: BP (!) 109/58   Pulse (!) 59   Temp (!) 97.4 F (36.3 C) (Oral)   Resp (!) 9   Ht 6\' 5"  (1.956 m)   Wt 220 lb (99.8 kg)   SpO2 98%   BMI 26.09 kg/m   Physical Exam awake, alert.  Chest clear to auscultation bilaterally.  Heart with slightly bradycardic but regular rhythm.  Abdomen soft, positive bowel sounds, nontender.  Some rt CVA tenderness. No lower extremity edema.  Imaging: CT ABDOMEN PELVIS W CONTRAST  Result Date: 04/13/2021 CLINICAL DATA:  Right lower quadrant pain EXAM: CT ABDOMEN AND PELVIS WITH CONTRAST TECHNIQUE: Multidetector CT imaging of the abdomen and pelvis was performed using the standard protocol following bolus administration of intravenous contrast. CONTRAST:  119mL OMNIPAQUE IOHEXOL 300 MG/ML  SOLN COMPARISON:  CT abdomen and pelvis 09/28/2009 FINDINGS: Lower chest: Mild subsegmental atelectasis in the lung bases.  Hepatobiliary: Liver is normal in size and contour with no suspicious mass identified. Small hypodensity adjacent to the falciform ligament likely represents focal fatty infiltration. No gallstones, gallbladder wall thickening, or biliary dilatation. Pancreas: Unremarkable. No pancreatic ductal dilatation or surrounding inflammatory changes. Spleen: Enlarged measuring 14 cm in length. Adrenals/Urinary Tract: Adrenal glands appear normal. Urinary bladder appears within normal limits. Right kidney: Large calculus in the lower pole of the right kidney measuring up to 1.5 x 1.5 x 2.6 cm in axial and craniocaudal dimensions.  The stone appears to obstruct the lower pole calices which are mild-to-moderately distended. There is relatively heterogeneous and diminished perfusion in the lower pole of the right kidney with adjacent mild fat stranding/fluid. No dilatation of the renal pelvis or ureter. 2.3 cm simple appearing exophytic cyst in the upper pole. Left kidney: No nephrolithiasis or hydronephrosis. There is a smoothly marginated partially exophytic mass at the medial aspect of the kidney which demonstrates inhomogeneous apparent mild diffuse enhancement and measures up to 3.9 x 6.5 x 6.3 cm in axial and craniocaudal dimensions, and is new since previous study. Stomach/Bowel: No bowel obstruction, free air or pneumatosis. No bowel wall edema. Appendix is normal. Vascular/Lymphatic: No bulky lymphadenopathy identified. Mild atherosclerotic disease. Reproductive: Prostate gland is enlarged. Other: No ascites. Small umbilical hernia containing fat. Small right inguinal hernia containing fat. Musculoskeletal: No suspicious bony lesions. IMPRESSION: 1. Large calculus in the lower pole of the right kidney causing obstruction of the lower pole calices. Mild perinephric fat stranding/fluid at the lower pole right kidney may be reactive and/or secondary to caliceal rupture. Also correlation for possible superimposed infection  recommended. 2. Solid-appearing exophytic mass at the medial aspect of the left kidney which appears to demonstrate mild diffuse enhancement, concerning for malignancy. Consider follow-up with ultrasound and renal mass protocol MRI, and urology consultation is recommended. 3. Prostatomegaly. 4. Splenomegaly. Electronically Signed   By: Ofilia Neas M.D.   On: 04/13/2021 08:40   US RENAL  Result Date: 04/14/2021 CLINICAL DATA:  Abnormal CT scan EXAM: RENAL / URINARY TRACT ULTRASOUND COMPLETE COMPARISON:  CT abdomen and pelvis dated April 13, 2021 FINDINGS: Right Kidney: Renal measurements: 14.7 x 6.7 x 5.8 cm = volume: 299 mL. Simple cyst of the upper pole of the right kidney measuring 2.2 x 2.0 x 1.5 cm. Large calculus of the lower pole the right kidney measuring up to 2.4 cm. No hydronephrosis. Left Kidney: Renal measurements: 14.0 x 6.8 x 7.0 cm = volume: 346 mL. Solid mass of the lower pole left kidney measuring 3.1 x 4.0 x 4.4 cm. No hydronephrosis. Bladder: Appears normal for degree of bladder distention. Bilateral ureter jets visualized. Other: Suboptimal images due to bowel gas. IMPRESSION: 1. 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 Recommend further evaluation with renal protocol MRI. 2. Large calculus of the lower pole the right kidney measuring up to 2.4 cm. 3. No hydronephrosis. Caliectasis of the lower pole the right kidney which was seen on prior CT is not well visualized, likely due to overlying bowel gas. Electronically Signed   By: Yetta Glassman M.D.   On: 04/14/2021 11:25   DG Chest Port 1 View  Result Date: 04/14/2021 CLINICAL DATA:  55 year old male with possible sepsis. EXAM: PORTABLE CHEST 1 VIEW COMPARISON:  Chest radiographs 03/23/2014 and earlier. FINDINGS: Portable AP upright view at 0410 hours. Normal lung volumes and mediastinal contours. Visualized tracheal air column is within normal limits. Allowing for  portable technique the lungs are clear. No pneumothorax or pleural effusion. No acute osseous abnormality identified. IMPRESSION: Negative portable chest. Electronically Signed   By: Genevie Ann M.D.   On: 04/14/2021 04:29    Labs:  CBC: Recent Labs    02/21/21 0920 04/13/21 0549 04/14/21 0354  WBC 6.3 13.1* 12.2*  HGB 14.9 15.9 15.2  HCT 43.5 44.3 44.1  PLT 263 245 182    COAGS: Recent Labs    04/14/21 0354  INR 1.4*  APTT 28    BMP: Recent Labs    02/21/21 0920 04/13/21 0549 04/14/21 0354  NA 140 136 137  K 4.6 4.0 3.8  CL 102 107 105  CO2 23 19* 21*  GLUCOSE 101* 122* 155*  BUN 20 18 28*  CALCIUM 9.9 9.5 9.2  CREATININE 1.16 1.19 1.86*  GFRNONAA  --  >60 42*    LIVER FUNCTION TESTS: Recent Labs    02/21/21 0920 04/13/21 0549 04/14/21 0354  BILITOT 0.5 0.5 2.8*  AST 23 24 35  ALT 22 24 28   ALKPHOS 66 59 58  PROT 7.1 7.4 7.5  ALBUMIN 4.9 4.5 4.2    TUMOR MARKERS: No results for input(s): AFPTM, CEA, CA199, CHROMGRNA in the last 8760 hours.  Assessment and Plan: 55 y.o. male ,ex-smoker, with history of nephrolithiasis who recently presented to [facility] ED with fever, chills, rigors, weakness, nausea, abdominal/back discomfort.  CT of the abdomen and pelvis yesterday revealed:  1. Large calculus in the lower pole of the right kidney causing obstruction of the lower pole calices. Mild perinephric fat stranding/fluid at the lower pole right kidney may be reactive and/or secondary to caliceal rupture. Also correlation for possible superimposed infection recommended. 2. Solid-appearing exophytic mass at the medial aspect of the left kidney which appears to demonstrate mild diffuse enhancement, concerning for malignancy. Consider follow-up with ultrasound and renal mass protocol MRI, and urology consultation is recommended. 3. Prostatomegaly. 4. Splenomegaly.   He is currently afebrile, lactic acid normal, urine culture pending, blood culture negative  to date, COVID-19/flu negative WBC 12.2, hemoglobin/platelets normal, creatinine 1.86, PT/INR 17.1/1.4; pt on IV rocephin; urology has now requested right percutaneous nephrostomy.  Imaging studies have been reviewed by Dr.Mugweru. Risks and benefits of right PCN placement was discussed with the patient including, but not limited to, infection, bleeding, significant bleeding causing loss or decrease in renal function or damage to adjacent structures.   All of the patient's questions were answered, patient is agreeable to proceed.  Consent signed and in chart.  Procedure scheduled for today.       Thank you for this interesting consult.  I greatly enjoyed meeting Mitchell Hughes and look forward to participating in their care.  A copy of this report was sent to the requesting provider on this date.  Electronically Signed: D. Rowe Robert, PA-C 04/14/2021, 11:38 AM   I spent a total of  25 minutes   in face to face in clinical consultation, greater than 50% of which was counseling/coordinating care for right percutaneous nephrostomy

## 2021-04-14 NOTE — ED Notes (Signed)
Upon assessment pt's bedding and clothes saturated with sweat. This nurse did a full linen change, changed pt out of regular clothes, placed pt into hospital gown and grip socks.

## 2021-04-14 NOTE — Consult Note (Signed)
Reason for Consult Large RIGHT Renal Stone with Focal Obstruction, Large LEFT Renal Mass, Right Non-Complex Renal Cysts, Sepsis of Urinary Origin, Prostate Screening  Referring Physician: Joseph Berkshire Hughes  Mitchell Hughes is an 55 y.o. male.   HPI:   1 - Large RIGHT Renal Stone with Focal Obstruction - Rt 2.6cm lower mid infundibular stone with lower pole focal hydro on ER CT 04/2021 on eva flank pain. Stone is solitary.  2- Large LEFT Renal Mass - 6cm heterogenous, enhancing mid posterior mass incidental on CT 04/2021. 2 artery (lower acessory) / 1 vein left renovascular anatomy.   3- Right Non-Complex Renal Cyst - 2cm Rt upper posterior cyst w/o complex featurs or mass effect on CT 04/2021. Unlike solid left mass, this has NO overt neoplastic featurs.  4 -Sepsis of Urinary Origin - fever to 104, tachycardia, l;actic acidosis, rigors at ER presentation 04/2021. UA unimpressive, but high suspicion of Rt lower pole kidney obstructing source. Hughes, Mitchell 12/11 pending. Placed on empiric Rocephin.  5 -  Prostate Screening - PSA 1.7 2022.  PMH sig for foot surgery. No ischemic CV disease / blood thinners. His PCP is Mitchell Hughes.   Today "Mitchell Hughes" is seen for emergent consultation for above. He has complex problem with acute sepsis from likely partially obstructing large Rt renal stone, but also significant left renal mass. Last meal 8pm 12/10.   Past Medical History:  Diagnosis Date   Erectile dysfunction    Family history of colon cancer    sister in 47s   Family history of heart disease 2011   baseline cardiac eval 2011 with Dr. Sallyanne Kuster   H/O echocardiogram 2015   Dr. Wynonia Lawman   History of exercise stress test    04/2014 Dr. Wynonia Lawman, prior 2011 normal Bruce treadmill stress test, Dr. Debara Pickett   LOW BACK PAIN, ACUTE 09/26/2009   RENAL CALCULUS, RIGHT 09/28/2009   9.35mm on CT   TOBACCO USE, QUIT 09/26/2009   Wears glasses     Past Surgical History:  Procedure Laterality Date    COLONOSCOPY  08/2017   tubular adenoma, repeat 5 years; Dr. La Paz Cellar   FOOT SURGERY     bone spur, right; Dr. Milinda Pointer   WISDOM TOOTH EXTRACTION      Family History  Problem Relation Age of Onset   Thyroid disease Mother    Other Mother        brain disease, possibly dementia?   Gallstones Mother    Other Father        spinal cord injury/accident   Heart disease Father 67   Heart disease Sister 26       artificial valve   Colon cancer Sister    Heart disease Brother 25       MI   Diabetes Brother    Pancreatic cancer Brother    Cancer Sister 91       colon   Cancer Sister        pancreas   Pancreatic cancer Sister    Heart disease Sister    Heart disease Brother 68       pacemaker   Cancer Brother        liver, formerly pancreas   Heart disease Other        parent, other relative   Colon cancer Other    Stroke Neg Hx    Hypertension Neg Hx    Rectal cancer Neg Hx    Stomach cancer Neg Hx  Social History:  reports that he quit smoking about 12 years ago. He has a 7.50 pack-year smoking history. He has never used smokeless tobacco. He reports current alcohol use of about 4.0 standard drinks per week. He reports that he does not use drugs.  Allergies: No Known Allergies  Medications: I have reviewed the patient's current medications.  Results for orders placed or performed during the hospital encounter of 04/14/21 (from the past 48 hour(s))  Lactic acid, plasma     Status: Abnormal   Collection Time: 04/14/21  3:54 AM  Result Value Ref Range   Lactic Acid, Venous 3.8 (HH) 0.5 - 1.9 mmol/L    Comment: CRITICAL RESULT CALLED TO, READ BACK BY AND VERIFIED WITH: GALLOWAY,B @ 0433 ON 04/14/21 BY JUW Performed at [facility], [address] [place], [place] [address]   Comprehensive metabolic panel     Status: Abnormal   Collection Time: 04/14/21  3:54 AM  Result Value Ref Range   Sodium 137 135 - 145 mmol/L   Potassium 3.8 3.5 - 5.1 mmol/L   Chloride  105 98 - 111 mmol/L   CO2 21 (L) 22 - 32 mmol/L   Glucose, Bld 155 (H) 70 - 99 mg/dL    Comment: Glucose reference range applies only to samples taken after fasting for at least 8 hours.   BUN 28 (H) 6 - 20 mg/dL   Creatinine, Ser 1.86 (H) 0.61 - 1.24 mg/dL   Calcium 9.2 8.9 - 10.3 mg/dL   Total Protein 7.5 6.5 - 8.1 g/dL   Albumin 4.2 3.5 - 5.0 g/dL   AST 35 15 - 41 U/L   ALT 28 0 - 44 U/L   Alkaline Phosphatase 58 38 - 126 U/L   Total Bilirubin 2.8 (H) 0.3 - 1.2 mg/dL   GFR, Estimated 42 (L) >60 mL/min    Comment: (NOTE) Calculated using the CKD-EPI Creatinine Equation (2021)    Anion gap 11 5 - 15    Comment: Performed at [facility], [address], [place], [place] [address]  CBC WITH DIFFERENTIAL     Status: Abnormal   Collection Time: 04/14/21  3:54 AM  Result Value Ref Range   WBC 12.2 (H) 4.0 - 10.5 K/uL   RBC 4.95 4.22 - 5.81 MIL/uL   Hemoglobin 15.2 13.0 - 17.0 g/dL   HCT 44.1 39.0 - 52.0 %   MCV 89.1 80.0 - 100.0 fL   MCH 30.7 26.0 - 34.0 pg   MCHC 34.5 30.0 - 36.0 g/dL   RDW 12.7 11.5 - 15.5 %   Platelets 182 150 - 400 K/uL   nRBC 0.0 0.0 - 0.2 %   Neutrophils Relative % 90 %   Neutro Abs 10.9 (H) 1.7 - 7.7 K/uL   Lymphocytes Relative 8 %   Lymphs Abs 1.0 0.7 - 4.0 K/uL   Monocytes Relative 1 %   Monocytes Absolute 0.2 0.1 - 1.0 K/uL   Eosinophils Relative 0 %   Eosinophils Absolute 0.0 0.0 - 0.5 K/uL   Basophils Relative 0 %   Basophils Absolute 0.1 0.0 - 0.1 K/uL   Immature Granulocytes 1 %   Abs Immature Granulocytes 0.10 (H) 0.00 - 0.07 K/uL    Comment: Performed at [facility], [address], [place], [place] [address]  Protime-INR     Status: Abnormal   Collection Time: 04/14/21  3:54 AM  Result Value Ref Range   Prothrombin Time 17.1 (H) 11.4 - 15.2 seconds   INR  1.4 (H) 0.8 - 1.2    Comment: (NOTE) INR goal varies based on device and disease states. Performed at [facility], [address] [place], [place] [address]   APTT     Status: None    Collection Time: 04/14/21  3:54 AM  Result Value Ref Range   aPTT 28 24 - 36 seconds    Comment: Performed at [facility], [address], [place], [place] [address]  Blood Culture (routine x 2)     Status: None (Preliminary result)   Collection Time: 04/14/21  3:55 AM   Specimen: Right Antecubital; Blood  Result Value Ref Range   Specimen Description RIGHT ANTECUBITAL    Special Requests      BOTTLES DRAWN AEROBIC AND ANAEROBIC Blood Culture adequate volume   Culture      NO GROWTH < 12 HOURS Performed at [facility], [address] [place], [place] [address]    Report Status PENDING   Blood Culture (routine x 2)     Status: None (Preliminary result)   Collection Time: 04/14/21  3:57 AM   Specimen: Left Antecubital; Blood  Result Value Ref Range   Specimen Description LEFT ANTECUBITAL    Special Requests      BOTTLES DRAWN AEROBIC AND ANAEROBIC Blood Culture adequate volume   Culture      NO GROWTH < 12 HOURS Performed at [facility], [address], [place], [place] [address]    Report Status PENDING   Resp Panel by RT-PCR (Flu A&B, Covid) Nasopharyngeal Swab     Status: None   Collection Time: 04/14/21  4:03 AM   Specimen: Nasopharyngeal Swab; Nasopharyngeal(NP) swabs in vial transport medium  Result Value Ref Range   SARS Coronavirus 2 by RT PCR NEGATIVE NEGATIVE    Comment: (NOTE) SARS-CoV-2 target nucleic acids are NOT DETECTED.  The SARS-CoV-2 RNA is generally detectable in upper respiratory specimens during the acute phase of infection. The lowest concentration of SARS-CoV-2 viral copies this assay can detect is 138 copies/mL. A negative result does not preclude SARS-Cov-2 infection and should not be used as the sole basis for treatment or other patient management decisions. A negative result may occur with  improper specimen collection/handling, submission of specimen other than nasopharyngeal swab, presence of viral mutation(s) within the areas targeted by  this assay, and inadequate number of viral copies(<138 copies/mL). A negative result must be combined with clinical observations, patient history, and epidemiological information. The expected result is Negative.  Fact Sheet for Patients:  EntrepreneurPulse.com.au  Fact Sheet for Healthcare Providers:  IncredibleEmployment.be  This test is no t yet approved or cleared by the [place] FDA and  has been authorized for detection and/or diagnosis of SARS-CoV-2 by FDA under an Emergency Use Authorization (EUA). This EUA will remain  in effect (meaning this test can be used) for the duration of the COVID-19 declaration under Section 564(b)(1) of the Act, 21 U.S.C.section 360bbb-3(b)(1), unless the authorization is terminated  or revoked sooner.       Influenza A by PCR NEGATIVE NEGATIVE   Influenza B by PCR NEGATIVE NEGATIVE    Comment: (NOTE) The Xpert Xpress SARS-CoV-2/FLU/RSV plus assay is intended as an aid in the diagnosis of influenza from Nasopharyngeal swab specimens and should not be used as a sole basis for treatment. Nasal washings and aspirates are unacceptable for Xpert Xpress SARS-CoV-2/FLU/RSV testing.  Fact Sheet for Patients: EntrepreneurPulse.com.au  Fact Sheet for Healthcare Providers: IncredibleEmployment.be  This test is not yet approved  or cleared by the [place] and has been authorized for detection and/or diagnosis of SARS-CoV-2 by FDA under an Emergency Use Authorization (EUA). This EUA will remain in effect (meaning this test can be used) for the duration of the COVID-19 declaration under Section 564(b)(1) of the Act, 21 U.S.C. section 360bbb-3(b)(1), unless the authorization is terminated or revoked.  Performed at [facility], [address], [place], [place] [address]   Urinalysis, Routine w reflex microscopic Urine, Clean Catch     Status: Abnormal   Collection  Time: 04/14/21  4:41 AM  Result Value Ref Range   Color, Urine YELLOW YELLOW   APPearance CLEAR CLEAR   Specific Gravity, Urine 1.020 1.005 - 1.030   pH 6.0 5.0 - 8.0   Glucose, UA NEGATIVE NEGATIVE mg/dL   Hgb urine dipstick MODERATE (A) NEGATIVE   Bilirubin Urine NEGATIVE NEGATIVE   Ketones, ur NEGATIVE NEGATIVE mg/dL   Protein, ur 30 (A) NEGATIVE mg/dL   Nitrite NEGATIVE NEGATIVE   Leukocytes,Ua MODERATE (A) NEGATIVE    Comment: Performed at [facility], [address], [place], [place] [address]  Urinalysis, Microscopic (reflex)     Status: Abnormal   Collection Time: 04/14/21  4:41 AM  Result Value Ref Range   RBC / HPF 0-5 0 - 5 RBC/hpf   WBC, UA 11-20 0 - 5 WBC/hpf   Bacteria, UA FEW (A) NONE SEEN   Squamous Epithelial / LPF 0-5 0 - 5    Comment: Performed at [facility], [address], [place], [place] [address]  Lactic acid, plasma     Status: None   Collection Time: 04/14/21  5:32 AM  Result Value Ref Range   Lactic Acid, Venous 1.5 0.5 - 1.9 mmol/L    Comment: Performed at [facility], [address] [place], [place] [address]    CT ABDOMEN PELVIS W CONTRAST  Result Date: 04/13/2021 CLINICAL DATA:  Right lower quadrant pain EXAM: CT ABDOMEN AND PELVIS WITH CONTRAST TECHNIQUE: Multidetector CT imaging of the abdomen and pelvis was performed using the standard protocol following bolus administration of intravenous contrast. CONTRAST:  179mL OMNIPAQUE IOHEXOL 300 MG/ML  SOLN COMPARISON:  CT abdomen and pelvis 09/28/2009 FINDINGS: Lower chest: Mild subsegmental atelectasis in the lung bases. Hepatobiliary: Liver is normal in size and contour with no suspicious mass identified. Small hypodensity adjacent to the falciform ligament likely represents focal fatty infiltration. No gallstones, gallbladder wall thickening, or biliary dilatation. Pancreas: Unremarkable. No pancreatic ductal dilatation or surrounding inflammatory changes. Spleen: Enlarged measuring 14 cm in length.  Adrenals/Urinary Tract: Adrenal glands appear normal. Urinary bladder appears within normal limits. Right kidney: Large calculus in the lower pole of the right kidney measuring up to 1.5 x 1.5 x 2.6 cm in axial and craniocaudal dimensions. The stone appears to obstruct the lower pole calices which are mild-to-moderately distended. There is relatively heterogeneous and diminished perfusion in the lower pole of the right kidney with adjacent mild fat stranding/fluid. No dilatation of the renal pelvis or ureter. 2.3 cm simple appearing exophytic cyst in the upper pole. Left kidney: No nephrolithiasis or hydronephrosis. There is a smoothly marginated partially exophytic mass at the medial aspect of the kidney which demonstrates inhomogeneous apparent mild diffuse enhancement and measures up to 3.9 x 6.5 x 6.3 cm in axial and craniocaudal dimensions, and is new since previous study. Stomach/Bowel: No bowel obstruction, free air or pneumatosis. No bowel wall edema. Appendix is normal. Vascular/Lymphatic: No bulky lymphadenopathy identified. Mild atherosclerotic disease.  Reproductive: Prostate gland is enlarged. Other: No ascites. Small umbilical hernia containing fat. Small right inguinal hernia containing fat. Musculoskeletal: No suspicious bony lesions. IMPRESSION: 1. Large calculus in the lower pole of the right kidney causing obstruction of the lower pole calices. Mild perinephric fat stranding/fluid at the lower pole right kidney may be reactive and/or secondary to caliceal rupture. Also correlation for possible superimposed infection recommended. 2. Solid-appearing exophytic mass at the medial aspect of the left kidney which appears to demonstrate mild diffuse enhancement, concerning for malignancy. Consider follow-up with ultrasound and renal mass protocol MRI, and urology consultation is recommended. 3. Prostatomegaly. 4. Splenomegaly. Electronically Signed   By: Ofilia Neas M.D.   On: 04/13/2021 08:40    DG Chest Port 1 View  Result Date: 04/14/2021 CLINICAL DATA:  55 year old male with possible sepsis. EXAM: PORTABLE CHEST 1 VIEW COMPARISON:  Chest radiographs 03/23/2014 and earlier. FINDINGS: Portable AP upright view at 0410 hours. Normal lung volumes and mediastinal contours. Visualized tracheal air column is within normal limits. Allowing for portable technique the lungs are clear. No pneumothorax or pleural effusion. No acute osseous abnormality identified. IMPRESSION: Negative portable chest. Electronically Signed   By: Genevie Ann M.D.   On: 04/14/2021 04:29    Review of Systems  Constitutional:  Negative for chills, fatigue and fever.  HENT: Negative.    Eyes: Negative.   Respiratory: Negative.    Cardiovascular: Negative.   Gastrointestinal: Negative.   Endocrine: Negative.   Genitourinary:  Positive for flank pain.  Skin: Negative.   Allergic/Immunologic: Negative.   Neurological: Negative.   Hematological: Negative.   Psychiatric/Behavioral: Negative.    Blood pressure (!) 91/59, pulse 82, temperature (!) 101.1 F (38.4 C), temperature source Rectal, resp. rate 20, height 6\' 5"  (1.956 m), weight 99.8 kg, SpO2 96 %. Physical Exam Constitutional:      Comments: Very pleasant.   HENT:     Head: Normocephalic.     Mouth/Throat:     Mouth: Mucous membranes are moist.  Eyes:     Pupils: Pupils are equal, round, and reactive to light.  Pulmonary:     Effort: Pulmonary effort is normal.  Abdominal:     General: Abdomen is flat.  Genitourinary:    Comments: Mild Rt CVAT at present Musculoskeletal:        General: Normal range of motion.     Cervical back: Normal range of motion.  Skin:    General: Skin is warm.  Neurological:     General: No focal deficit present.     Mental Status: He is alert.  Psychiatric:        Mood and Affect: Mood normal.    Assessment/Plan:  Frankly discussed with pt acute and subacute goals. Acute goal is Rt renal decompression and IV ABX  to clear urosepsis. Prefer lower pole access nephrostomy as ideal as this would allow reliable drainage of affected obstructed area and provide access for percutaneous stone surgery after clear infectious parameters. Stenting not preferred as low chance of being able to relived the lower pole focal obstruction and does nothing for future renal access.  Medium term, rec Rt PCNL with goal of stone free, then left radical nephrectomy after healed and verified new GFR baseline (likely few mos from now, MRI after stone removal for left mass restaging, verify new Rt anatomic baseline)  Sincerely appreciate ER, IR, Medical service comanagment of this complex patient.     Alexis Frock 04/14/2021, 8:32 AM

## 2021-04-14 NOTE — ED Notes (Signed)
Called c- link for er-er transfer at this time will be after shift change. Mitchell Hughes

## 2021-04-14 NOTE — Procedures (Signed)
Vascular and Interventional Radiology Procedure Note  Patient: Mitchell Hughes DOB: [birth year] Medical Record Number: [id number] Note Date/Time: 04/14/21 12:34 PM   Performing Physician: Michaelle Birks, MD Assistant(s): None  Diagnosis: Obstructing R kidney stone  Procedure:  RIGHT NEPHROSTOMY TUBE PLACEMENT  Anesthesia: Conscious Sedation Complications: None Estimated Blood Loss: Minimal Specimens:  None  Findings:  Successful placement of right-sided, 10 F nephrostomy tube into the right kidney(s).  5 mL purulent aspirate. Submitted for GS and Cx  Plan: Flush PCN w 5 mL and record drain output qShift. Follow up for routine nephrostomy tube exchange in 8 week(s).   See detailed procedure note with images in PACS. The patient tolerated the procedure well without incident or complication and was returned to Floor Bed in stable condition.    Michaelle Birks, MD [facility] Radiology   Pager. Evangeline

## 2021-04-15 LAB — COMPREHENSIVE METABOLIC PANEL
ALT: 24 U/L (ref 0–44)
AST: 25 U/L (ref 15–41)
Albumin: 3.1 g/dL — ABNORMAL LOW (ref 3.5–5.0)
Alkaline Phosphatase: 39 U/L (ref 38–126)
Anion gap: 8 (ref 5–15)
BUN: 22 mg/dL — ABNORMAL HIGH (ref 6–20)
CO2: 23 mmol/L (ref 22–32)
Calcium: 8.3 mg/dL — ABNORMAL LOW (ref 8.9–10.3)
Chloride: 106 mmol/L (ref 98–111)
Creatinine, Ser: 1.15 mg/dL (ref 0.61–1.24)
GFR, Estimated: >60 mL/min (ref >60)
Glucose, Bld: 128 mg/dL — ABNORMAL HIGH (ref 70–99)
Potassium: 3.9 mmol/L (ref 3.5–5.1)
Sodium: 137 mmol/L (ref 135–145)
Total Bilirubin: 1.6 mg/dL — ABNORMAL HIGH (ref 0.3–1.2)
Total Protein: 6 g/dL — ABNORMAL LOW (ref 6.5–8.1)

## 2021-04-15 LAB — URINE CULTURE: Culture: NO GROWTH

## 2021-04-15 LAB — CBC WITH DIFFERENTIAL/PLATELET
Abs Immature Granulocytes: 0.06 10*3/uL (ref 0.00–0.07)
Basophils Absolute: 0 10*3/uL (ref 0.0–0.1)
Basophils Relative: 0 %
Eosinophils Absolute: 0 10*3/uL (ref 0.0–0.5)
Eosinophils Relative: 0 %
HCT: 36 % — ABNORMAL LOW (ref 39.0–52.0)
Hemoglobin: 12.1 g/dL — ABNORMAL LOW (ref 13.0–17.0)
Immature Granulocytes: 1 %
Lymphocytes Relative: 4 %
Lymphs Abs: 0.4 10*3/uL — ABNORMAL LOW (ref 0.7–4.0)
MCH: 30 pg (ref 26.0–34.0)
MCHC: 33.6 g/dL (ref 30.0–36.0)
MCV: 89.1 fL (ref 80.0–100.0)
Monocytes Absolute: 0.5 10*3/uL (ref 0.1–1.0)
Monocytes Relative: 5 %
Neutro Abs: 7.5 10*3/uL (ref 1.7–7.7)
Neutrophils Relative %: 90 %
Platelets: 103 10*3/uL — ABNORMAL LOW (ref 150–400)
RBC: 4.04 MIL/uL — ABNORMAL LOW (ref 4.22–5.81)
RDW: 13.1 % (ref 11.5–15.5)
WBC: 8.3 10*3/uL (ref 4.0–10.5)
nRBC: 0 % (ref 0.0–0.2)

## 2021-04-15 LAB — PROTIME-INR
INR: 1.6 — ABNORMAL HIGH (ref 0.8–1.2)
Prothrombin Time: 18.6 seconds — ABNORMAL HIGH (ref 11.4–15.2)

## 2021-04-15 LAB — BLOOD CULTURE ID PANEL (REFLEXED) - BCID2

## 2021-04-15 LAB — MAGNESIUM: Magnesium: 2 mg/dL (ref 1.7–2.4)

## 2021-04-15 LAB — PROCALCITONIN: Procalcitonin: 46.05 ng/mL

## 2021-04-15 LAB — CORTISOL-AM, BLOOD: Cortisol - AM: 41.8 ug/dL — ABNORMAL HIGH (ref 6.7–22.6)

## 2021-04-15 MED ORDER — LORATADINE 10 MG PO TABS
10.0000 mg | ORAL_TABLET | Freq: Two times a day (BID) | ORAL | Status: DC
  Administered 2021-04-15 – 2021-04-17 (×4): 10 mg via ORAL
  Filled 2021-04-15 (×4): qty 1

## 2021-04-15 NOTE — Progress Notes (Addendum)
PROGRESS NOTE    Mitchell Hughes  JHE:[id number] DOB: 19-Oct-1965 DOA: 04/14/2021 PCP: Carlena Hurl, PA-C    Brief Narrative:  This 55 years old male with PMH significant for nephrolithiasis presented to the ED with complaints of right lower quadrant abdominal pain associated with nausea, vomiting and fever.CT abdomen and pelvis shows large calculus in the lower pole of right kidney causing obstruction and possible superimposed infection.  There is also a solid-appearing exophytic mass in the medial aspect of left kidney demonstrate diffuse enhancement concerning for malignancy. Patient is admitted for sepsis secondary to UTI /infected nephrolithiasis.  Patient started on empiric antibiotics,  IV Rocephin, IV hydration.  Urology is consulted.  Patient underwent percutaneous nephrostomy tubes by IR on 12/11.   Assessment & Plan:   Principal Problem:   Sepsis ([place]) Active Problems:   Nephrolithiasis   Acute lower UTI  Severe sepsis secondary to UTI: Patient presented with fever, tachycardia, tachypnea, leukocytosis, AKI,  lactic acidosis and hypotension. UA consistent with UTI Continue empiric antibiotics ceftriaxone. Continue IV hydration.  Hypotension has resolved with hydration. Sepsis physiology is improving. Follow-up urine cultures.  Right Nephrolithiasis with hydronephrosis.: Urology is consulted.  Recommended percutaneous nephrostomy tube. Patient underwent percutaneous nephrostomy tube by IR on 12/11. Continue adequate pain control and IV hydration.  AKI: Suspect Multifactorial. Patient has been taking ibuprofen for chronic pain. Obstructing nephrolithiasis also contributing to AKI. Decreased p.o. intake with dehydration Continue IV gentle hydration. Avoid nephrotoxic medication Renal functions improving.  Mass on the CT scan: Urology is following needs outpatient follow-up once infection is over.   DVT prophylaxis: Lovenox Code Status: Full code. Family  Communication: No family at bed side. Disposition Plan:   Status is: Inpatient  Remains inpatient appropriate because: Admitted for sepsis secondary to obstructing infected nephrolithiasis ,underwent percutaneous nephrostomy tube.  Sepsis physiology improving.   Patient medically cleared for discharge: No   Consultants:  Urology  Procedures: Percutaneous nephrostomy tube Antimicrobials:  Anti-infectives (From admission, onward)    Start     Dose/Rate Route Frequency Ordered Stop   04/14/21 0345  cefTRIAXone (ROCEPHIN) 2 g in sodium chloride 0.9 % 100 mL IVPB        2 g 200 mL/hr over 30 Minutes Intravenous Every 24 hours 04/14/21 0343 04/21/21 0344        Subjective: Patient was seen and examined at bedside.  Overnight events noted.   Patient reports feeling much improved.  He remains afebrile, renal functions are improving.  Objective: Vitals:   04/15/21 1156 04/15/21 1200 04/15/21 1300 04/15/21 1400  BP:  113/61 108/62 120/72  Pulse:  66 68 72  Resp:  18 16 16   Temp: 98.2 F (36.8 C)     TempSrc: Oral     SpO2:  94% 99% 96%  Weight:      Height:        Intake/Output Summary (Last 24 hours) at 04/15/2021 1502 Last data filed at 04/15/2021 1432 Gross per 24 hour  Intake 1623.67 ml  Output 1275 ml  Net 348.67 ml   Filed Weights   04/14/21 0336  Weight: 99.8 kg    Examination:  General exam: Appears comfortable, not in any acute distress. Respiratory system: Clear to auscultation bilaterally, respiratory effort normal, RR 15. Cardiovascular system: S1-S2 heard, regular rate and rhythm, no murmur. Gastrointestinal system: Abdomen is soft, nontender, nondistended, BS+.  Nephrostomy tube noted on right side. Central nervous system: Alert and oriented X 3. No focal neurological deficits. Extremities: No edema,  no cyanosis, no clubbing. Skin: No rashes, lesions or ulcers Psychiatry: Judgement and insight appear normal. Mood & affect appropriate.     Data  Reviewed: I have personally reviewed following labs and imaging studies  CBC: Recent Labs  Lab 04/13/21 0549 04/14/21 0354 04/15/21 0251  WBC 13.1* 12.2* 8.3  NEUTROABS 11.6* 10.9* 7.5  HGB 15.9 15.2 12.1*  HCT 44.3 44.1 36.0*  MCV 86.9 89.1 89.1  PLT 245 182 213*   Basic Metabolic Panel: Recent Labs  Lab 04/13/21 0549 04/14/21 0354 04/14/21 0923 04/15/21 0251  NA 136 137 139 137  K 4.0 3.8 3.3* 3.9  CL 107 105 109 106  CO2 19* 21* 24 23  GLUCOSE 122* 155* 121* 128*  BUN 18 28* 27* 22*  CREATININE 1.19 1.86* 1.58* 1.15  CALCIUM 9.5 9.2 8.7* 8.3*  MG  --   --   --  2.0   GFR: Estimated Creatinine Clearance: 89.3 mL/min (by C-G formula based on SCr of 1.15 mg/dL). Liver Function Tests: Recent Labs  Lab 04/13/21 0549 04/14/21 0354 04/15/21 0251  AST 24 35 25  ALT 24 28 24   ALKPHOS 59 58 39  BILITOT 0.5 2.8* 1.6*  PROT 7.4 7.5 6.0*  ALBUMIN 4.5 4.2 3.1*   No results for input(s): LIPASE, AMYLASE in the last 168 hours. No results for input(s): AMMONIA in the last 168 hours. Coagulation Profile: Recent Labs  Lab 04/14/21 0354 04/15/21 0251  INR 1.4* 1.6*   Cardiac Enzymes: No results for input(s): CKTOTAL, CKMB, CKMBINDEX, TROPONINI in the last 168 hours. BNP (last 3 results) No results for input(s): PROBNP in the last 8760 hours. HbA1C: No results for input(s): HGBA1C in the last 72 hours. CBG: No results for input(s): GLUCAP in the last 168 hours. Lipid Profile: No results for input(s): CHOL, HDL, LDLCALC, TRIG, CHOLHDL, LDLDIRECT in the last 72 hours. Thyroid Function Tests: No results for input(s): TSH, T4TOTAL, FREET4, T3FREE, THYROIDAB in the last 72 hours. Anemia Panel: No results for input(s): VITAMINB12, FOLATE, FERRITIN, TIBC, IRON, RETICCTPCT in the last 72 hours. Sepsis Labs: Recent Labs  Lab 04/14/21 0354 04/14/21 0532 04/14/21 0923 04/15/21 0251  PROCALCITON  --   --   --  46.05  LATICACIDVEN 3.8* 1.5 1.5  --     Recent Results  (from the past 240 hour(s))  Blood Culture (routine x 2)     Status: None (Preliminary result)   Collection Time: 04/14/21  3:55 AM   Specimen: Right Antecubital; Blood  Result Value Ref Range Status   Specimen Description   Final    RIGHT ANTECUBITAL Performed at [facility], [address] [place], [place] [address]    Special Requests   Final    BOTTLES DRAWN AEROBIC AND ANAEROBIC Blood Culture adequate volume Performed at [facility], [address] [place], [place] [address]    Culture  Setup Time   Final    GRAM NEGATIVE RODS Gram Stain Report Called to,Read Back By and Verified With:  A. MANSFIELD @ 1713 04/14/21 BY STEPHTR IN BOTH AEROBIC AND ANAEROBIC BOTTLES CRITICAL RESULT CALLED TO, READ BACK BY AND VERIFIED WITH: M LILLISTON,[email or web address]@0015  04/16/99    Culture   Final    GRAM NEGATIVE RODS TOO YOUNG TO READ Performed at [facility], [place] [address], [place], [place] [address]    Report Status PENDING  Incomplete  Blood Culture ID Panel (Reflexed)     Status: Abnormal   Collection Time: 04/14/21  3:55 AM  Result Value Ref Range Status   Enterococcus faecalis NOT DETECTED NOT DETECTED Final   Enterococcus Faecium NOT DETECTED NOT DETECTED Final   Listeria monocytogenes NOT DETECTED NOT DETECTED Final   Staphylococcus species NOT DETECTED NOT DETECTED Final   Staphylococcus aureus (BCID) NOT DETECTED NOT DETECTED Final   Staphylococcus epidermidis NOT DETECTED NOT DETECTED Final   Staphylococcus lugdunensis NOT DETECTED NOT DETECTED Final   Streptococcus species NOT DETECTED NOT DETECTED Final   Streptococcus agalactiae NOT DETECTED NOT DETECTED Final   Streptococcus pneumoniae NOT DETECTED NOT DETECTED Final   Streptococcus pyogenes NOT DETECTED NOT DETECTED Final   A.calcoaceticus-baumannii NOT DETECTED NOT DETECTED Final   Bacteroides fragilis NOT DETECTED NOT DETECTED Final   Enterobacterales DETECTED (A) NOT DETECTED Final    Comment: Enterobacterales  represent a large order of gram negative bacteria, not a single organism. CRITICAL RESULT CALLED TO, READ BACK BY AND VERIFIED WITH: M LILLISTON,[email or web address]@0014  04/15/21 [place]    Enterobacter cloacae complex NOT DETECTED NOT DETECTED Final   Escherichia coli NOT DETECTED NOT DETECTED Final   Klebsiella aerogenes NOT DETECTED NOT DETECTED Final   Klebsiella oxytoca DETECTED (A) NOT DETECTED Final    Comment: CRITICAL RESULT CALLED TO, READ BACK BY AND VERIFIED WITH: M LILLISTON,[email or web address]@0014  04/15/21 [place]    Klebsiella pneumoniae NOT DETECTED NOT DETECTED Final   Proteus species NOT DETECTED NOT DETECTED Final   Salmonella species NOT DETECTED NOT DETECTED Final   Serratia marcescens NOT DETECTED NOT DETECTED Final   Haemophilus influenzae NOT DETECTED NOT DETECTED Final   Neisseria meningitidis NOT DETECTED NOT DETECTED Final   Pseudomonas aeruginosa NOT DETECTED NOT DETECTED Final   Stenotrophomonas maltophilia NOT DETECTED NOT DETECTED Final   Candida albicans NOT DETECTED NOT DETECTED Final   Candida auris NOT DETECTED NOT DETECTED Final   Candida glabrata NOT DETECTED NOT DETECTED Final   Candida krusei NOT DETECTED NOT DETECTED Final   Candida parapsilosis NOT DETECTED NOT DETECTED Final   Candida tropicalis NOT DETECTED NOT DETECTED Final   Cryptococcus neoformans/gattii NOT DETECTED NOT DETECTED Final   CTX-M ESBL NOT DETECTED NOT DETECTED Final   Carbapenem resistance IMP NOT DETECTED NOT DETECTED Final   Carbapenem resistance KPC NOT DETECTED NOT DETECTED Final   Carbapenem resistance NDM NOT DETECTED NOT DETECTED Final   Carbapenem resist OXA 48 LIKE NOT DETECTED NOT DETECTED Final   Carbapenem resistance VIM NOT DETECTED NOT DETECTED Final    Comment: Performed at [facility], [address] [place], [place], [place] [address]  Blood Culture (routine x 2)     Status: None (Preliminary result)   Collection Time: 04/14/21  3:57 AM   Specimen: Left Antecubital; Blood  Result Value Ref  Range Status   Specimen Description   Final    LEFT ANTECUBITAL Performed at [facility], [address] [place], [place] [address]    Special Requests   Final    BOTTLES DRAWN AEROBIC AND ANAEROBIC Blood Culture adequate volume Performed at [facility], [address], [place], [place] [address]    Culture  Setup Time   Final    GRAM NEGATIVE RODS IN BOTH AEROBIC AND ANAEROBIC BOTTLES CRITICAL VALUE NOTED.  VALUE IS CONSISTENT WITH PREVIOUSLY REPORTED AND CALLED VALUE.    Culture   Final    GRAM NEGATIVE RODS TOO YOUNG TO READ Performed at [facility], [place] [address] [place],  [address]    Report Status PENDING  Incomplete  Resp Panel by RT-PCR (Flu  A&B, Covid) Nasopharyngeal Swab     Status: None   Collection Time: 04/14/21  4:03 AM   Specimen: Nasopharyngeal Swab; Nasopharyngeal(NP) swabs in vial transport medium  Result Value Ref Range Status   SARS Coronavirus 2 by RT PCR NEGATIVE NEGATIVE Final    Comment: (NOTE) SARS-CoV-2 target nucleic acids are NOT DETECTED.  The SARS-CoV-2 RNA is generally detectable in upper respiratory specimens during the acute phase of infection. The lowest concentration of SARS-CoV-2 viral copies this assay can detect is 138 copies/mL. A negative result does not preclude SARS-Cov-2 infection and should not be used as the sole basis for treatment or other patient management decisions. A negative result may occur with  improper specimen collection/handling, submission of specimen other than nasopharyngeal swab, presence of viral mutation(s) within the areas targeted by this assay, and inadequate number of viral copies(<138 copies/mL). A negative result must be combined with clinical observations, patient history, and epidemiological information. The expected result is Negative.  Fact Sheet for Patients:  EntrepreneurPulse.com.au  Fact Sheet for Healthcare Providers:   IncredibleEmployment.be  This test is no t yet approved or cleared by the [place] FDA and  has been authorized for detection and/or diagnosis of SARS-CoV-2 by FDA under an Emergency Use Authorization (EUA). This EUA will remain  in effect (meaning this test can be used) for the duration of the COVID-19 declaration under Section 564(b)(1) of the Act, 21 U.S.C.section 360bbb-3(b)(1), unless the authorization is terminated  or revoked sooner.       Influenza A by PCR NEGATIVE NEGATIVE Final   Influenza B by PCR NEGATIVE NEGATIVE Final    Comment: (NOTE) The Xpert Xpress SARS-CoV-2/FLU/RSV plus assay is intended as an aid in the diagnosis of influenza from Nasopharyngeal swab specimens and should not be used as a sole basis for treatment. Nasal washings and aspirates are unacceptable for Xpert Xpress SARS-CoV-2/FLU/RSV testing.  Fact Sheet for Patients: EntrepreneurPulse.com.au  Fact Sheet for Healthcare Providers: IncredibleEmployment.be  This test is not yet approved or cleared by the [place] FDA and has been authorized for detection and/or diagnosis of SARS-CoV-2 by FDA under an Emergency Use Authorization (EUA). This EUA will remain in effect (meaning this test can be used) for the duration of the COVID-19 declaration under Section 564(b)(1) of the Act, 21 U.S.C. section 360bbb-3(b)(1), unless the authorization is terminated or revoked.  Performed at [facility], [address], [place], [place] [address]   Urine Culture     Status: None   Collection Time: 04/14/21  4:41 AM   Specimen: In/Out Cath Urine  Result Value Ref Range Status   Specimen Description   Final    IN/OUT CATH URINE Performed at [facility], [address] [place], [place] [address]    Special Requests   Final    NONE Performed at [facility], [address], [place], [place] [address]    Culture   Final    NO GROWTH Performed at  [facility], [place] [address] [place], [place] [address]    Report Status 04/15/2021 FINAL  Final  MRSA Next Gen by PCR, Nasal     Status: None   Collection Time: 04/14/21  9:08 AM   Specimen: Nasal Mucosa; Nasal Swab  Result Value Ref Range Status   MRSA by PCR Next Gen NOT DETECTED NOT DETECTED Final    Comment: (NOTE) The GeneXpert MRSA Assay (FDA approved for NASAL specimens only), is one component of a comprehensive MRSA colonization surveillance program.  It is not intended to diagnose MRSA infection nor to guide or monitor treatment for MRSA infections. Test performance is not FDA approved in patients less than 44 years old. Performed at [facility], [place] [address], [place], [place] [address]   Aerobic/Anaerobic Culture w Gram Stain (surgical/deep wound)     Status: None (Preliminary result)   Collection Time: 04/14/21 12:33 PM   Specimen: Kidney; Urine  Result Value Ref Range Status   Specimen Description   Final    KIDNEY RIGHT Performed at [place] [address], [place], [place] [address]    Special Requests   Final    NONE Performed at [facility], [place] [address], [place], [place] [address]    Gram Stain   Final    ABUNDANT WBC PRESENT,BOTH PMN AND MONONUCLEAR NO ORGANISMS SEEN    Culture   Final    ABUNDANT KLEBSIELLA OXYTOCA SUSCEPTIBILITIES TO FOLLOW Performed at [facility], [place] [address], [place], [place] [address]    Report Status PENDING  Incomplete         Radiology Studies: US RENAL  Result Date: 04/14/2021 CLINICAL DATA:  Abnormal CT scan EXAM: RENAL / URINARY TRACT ULTRASOUND COMPLETE COMPARISON:  CT abdomen and pelvis dated April 13, 2021 FINDINGS: Right Kidney: Renal measurements: 14.7 x 6.7 x 5.8 cm = volume: 299 mL. Simple cyst of the upper pole of the right kidney measuring 2.2 x 2.0 x 1.5 cm. Large calculus of the lower pole the right kidney measuring up to  2.4 cm. No hydronephrosis. Left Kidney: Renal measurements: 14.0 x 6.8 x 7.0 cm = volume: 346 mL. Solid mass of the lower pole left kidney measuring 3.1 x 4.0 x 4.4 cm. No hydronephrosis. Bladder: Appears normal for degree of bladder distention. Bilateral ureter jets visualized. Other: Suboptimal images due to bowel gas. IMPRESSION: 1. 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 Recommend further evaluation with renal protocol MRI. 2. Large calculus of the lower pole the right kidney measuring up to 2.4 cm. 3. No hydronephrosis. Caliectasis of the lower pole the right kidney which was seen on prior CT is not well visualized, likely due to overlying bowel gas. Electronically Signed   By: Yetta Glassman M.D.   On: 04/14/2021 11:25   DG Chest Port 1 View  Result Date: 04/14/2021 CLINICAL DATA:  55 year old male with possible sepsis. EXAM: PORTABLE CHEST 1 VIEW COMPARISON:  Chest radiographs 03/23/2014 and earlier. FINDINGS: Portable AP upright view at 0410 hours. Normal lung volumes and mediastinal contours. Visualized tracheal air column is within normal limits. Allowing for portable technique the lungs are clear. No pneumothorax or pleural effusion. No acute osseous abnormality identified. IMPRESSION: Negative portable chest. Electronically Signed   By: Genevie Ann M.D.   On: 04/14/2021 04:29   IR NEPHROSTOMY PLACEMENT RIGHT  Result Date: 04/14/2021 INDICATION: RIGHT obstructing nephrolith. EXAM: ULTRASOUND AND FLUOROSCOPIC GUIDED PLACEMENT OF RIGHT NEPHROSTOMY TUBE COMPARISON:  CT AP, 04/13/2021. MEDICATIONS: The patient is on schedule IV antibiotics, with Rocephin. ANESTHESIA/SEDATION: Moderate (conscious) sedation was employed during this procedure. A total of Versed 2.5 mg and Fentanyl 150 mcg was administered intravenously. Moderate Sedation Time: 29 minutes. The patient's level of consciousness and vital signs were monitored continuously by radiology  nursing throughout the procedure under my direct supervision. CONTRAST:  15 mL Isovue 300 - administered into the renal collecting system FLUOROSCOPY TIME:  3.3  minutes 11 mGy. COMPLICATIONS: None immediate. PROCEDURE: The procedure, risks, benefits, and alternatives were explained to the the patient and/or patient's representative, questions were encouraged and answered and informed consent was obtained. A timeout was performed prior to the initiation of the procedure. The operative site was prepped and draped in the usual sterile fashion and a sterile drape was applied covering the operative field. A sterile gown and sterile gloves were used for the procedure. Local anesthesia was provided with 1% Lidocaine with epinephrine. Ultrasound was used to localize the RIGHT kidney. Under direct ultrasound guidance, a 20 gauge needle was advanced into the renal collecting system. An ultrasound image documentation was performed. Access within the collecting system was confirmed with the efflux of urine followed by limited contrast injection. Under intermittent fluoroscopic guidance, an 0.018 wire was advanced into the collecting system and the tract was dilated with an Accustick stent. Next, over a short Amplatz wire, the track was further dilated ultimately allowing placement of a 10-French percutaneous nephrostomy catheter with end coiled and locked within the renal pelvis. Contrast was injected and several spot fluoroscopic images were obtained in various obliquities. The catheter was secured at the skin entrance site with an interrupted suture and a stat lock device and connected to a gravity bag. Dressings were applied. The patient tolerated procedure well without immediate postprocedural complication. FINDINGS: Ultrasound scanning demonstrates a moderate to severely dilated RIGHT collecting system. A posterior inferior calix was targeted and allow placement of a 10-French percutaneous nephrostomy catheter with end  coiled and locked within the RIGHT inferior pole renal collecting system. Contrast injection confirmed appropriate positioning. 5 mL of purulent urine was obtained and submitted for analysis. IMPRESSION: Successful placement of a RIGHT sided 10.2 Fr percutaneous nephrostomy tube. 5 mL of purulent urine was obtained and submitted for analysis. Michaelle Birks, MD [facility] [address] [facility] Radiology Electronically Signed   By: Michaelle Birks M.D.   On: 04/14/2021 15:58    Scheduled Meds:  Chlorhexidine Gluconate Cloth  6 each Topical Daily   gabapentin  300 mg Oral QHS   heparin  5,000 Units Subcutaneous Q8H   hydrocortisone sod succinate (SOLU-CORTEF) inj  100 mg Intravenous Q12H   loratadine  10 mg Oral Daily   sodium chloride flush  5 mL Intracatheter Q12H   Continuous Infusions:  cefTRIAXone (ROCEPHIN)  IV Stopped (04/15/21 0415)   methocarbamol (ROBAXIN) IV Stopped (04/14/21 1447)     LOS: 1 day    Time spent: 35 mins    Dalayah Deahl, MD [facility]   If 7PM-7AM, please contact night-coverage

## 2021-04-15 NOTE — Progress Notes (Signed)
Subjective/Chief Complaint:  1 - Large RIGHT Renal Stone with Focal Obstruction - Rt 2.6cm lower mid infundibular stone with lower pole focal hydro on ER CT 04/2021 on eva flank pain. Stone is solitary.   2- Large LEFT Renal Mass - 6cm heterogenous, enhancing mid posterior mass incidental on CT 04/2021. 2 artery (lower acessory) / 1 vein left renovascular anatomy.    3- Sepsis of Urinary Origin - fever to 104, tachycardia, lactic acidosis, rigors at ER presentation 04/2021. UA unimpressive, but high suspicion of Rt lower pole kidney obstructing source. [place], [place] 12/11 klebsiella / pending. Placed on empiric Rocephin. IR nephrostomy paced 12/11.      Today "Mitchell Hughes" is improving. No high grade fevers overnight after neph tueb placed. [place] with klebsiella / penidng. Less malaise overnight.   Objective: Vital signs in last 24 hours: Temp:  [97.4 F (36.3 C)-100.3 F (37.9 C)] 98.3 F (36.8 C) (12/12 0800) Pulse Rate:  [59-111] 75 (12/12 0800) Resp:  [9-30] 22 (12/12 0800) BP: (90-148)/(52-79) 141/65 (12/12 0800) SpO2:  [94 %-99 %] 98 % (12/12 0800)    Intake/Output from previous day: 12/11 0701 - 12/12 0700 In: 1623.7 [I.V.:1576.3; IV Piggyback:47.4] Out: 1255 [Urine:1250] Intake/Output this shift: No intake/output data recorded.  Physical Exam Constitutional:      Comments: Very pleasant.  In Good Spirits HENT:     Head: Normocephalic.     Mouth/Throat:     Mouth: Mucous membranes are moist.  Eyes:     Pupils: Pupils are equal, round, and reactive to light.  Pulmonary:     Effort: Pulmonary effort is normal.  Abdominal:     General: Abdomen is flat.  Genitourinary:    Comments: Rt neph tube I nlace  Musculoskeletal:        General: Normal range of motion.     Cervical back: Normal range of motion.  Skin:    General: Skin is warm.  Neurological:     General: No focal deficit present.     Mental Status: He is alert.  Psychiatric:        Mood and Affect: Mood  normal.  Lab Results:  Recent Labs    04/14/21 0354 04/15/21 0251  WBC 12.2* 8.3  HGB 15.2 12.1*  HCT 44.1 36.0*  PLT 182 103*   BMET Recent Labs    04/14/21 0923 04/15/21 0251  NA 139 137  K 3.3* 3.9  CL 109 106  CO2 24 23  GLUCOSE 121* 128*  BUN 27* 22*  CREATININE 1.58* 1.15  CALCIUM 8.7* 8.3*   PT/INR Recent Labs    04/14/21 0354 04/15/21 0251  LABPROT 17.1* 18.6*  INR 1.4* 1.6*   ABG No results for input(s): PHART, HCO3 in the last 72 hours.  Invalid input(s): PCO2, PO2  Studies/Results: US RENAL  Result Date: 04/14/2021 CLINICAL DATA:  Abnormal CT scan EXAM: RENAL / URINARY TRACT ULTRASOUND COMPLETE COMPARISON:  CT abdomen and pelvis dated April 13, 2021 FINDINGS: Right Kidney: Renal measurements: 14.7 x 6.7 x 5.8 cm = volume: 299 mL. Simple cyst of the upper pole of the right kidney measuring 2.2 x 2.0 x 1.5 cm. Large calculus of the lower pole the right kidney measuring up to 2.4 cm. No hydronephrosis. Left Kidney: Renal measurements: 14.0 x 6.8 x 7.0 cm = volume: 346 mL. Solid mass of the lower pole left kidney measuring 3.1 x 4.0 x 4.4 cm. No hydronephrosis. Bladder: Appears normal for degree of bladder distention. Bilateral ureter jets visualized.  Other: Suboptimal images due to bowel gas. IMPRESSION: 1. 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 Recommend further evaluation with renal protocol MRI. 2. Large calculus of the lower pole the right kidney measuring up to 2.4 cm. 3. No hydronephrosis. Caliectasis of the lower pole the right kidney which was seen on prior CT is not well visualized, likely due to overlying bowel gas. Electronically Signed   By: Yetta Glassman M.D.   On: 04/14/2021 11:25   DG Chest Port 1 View  Result Date: 04/14/2021 CLINICAL DATA:  55 year old male with possible sepsis. EXAM: PORTABLE CHEST 1 VIEW COMPARISON:  Chest radiographs 03/23/2014 and earlier. FINDINGS: Portable AP  upright view at 0410 hours. Normal lung volumes and mediastinal contours. Visualized tracheal air column is within normal limits. Allowing for portable technique the lungs are clear. No pneumothorax or pleural effusion. No acute osseous abnormality identified. IMPRESSION: Negative portable chest. Electronically Signed   By: Genevie Ann M.D.   On: 04/14/2021 04:29   IR NEPHROSTOMY PLACEMENT RIGHT  Result Date: 04/14/2021 INDICATION: RIGHT obstructing nephrolith. EXAM: ULTRASOUND AND FLUOROSCOPIC GUIDED PLACEMENT OF RIGHT NEPHROSTOMY TUBE COMPARISON:  CT AP, 04/13/2021. MEDICATIONS: The patient is on schedule IV antibiotics, with Rocephin. ANESTHESIA/SEDATION: Moderate (conscious) sedation was employed during this procedure. A total of Versed 2.5 mg and Fentanyl 150 mcg was administered intravenously. Moderate Sedation Time: 29 minutes. The patient's level of consciousness and vital signs were monitored continuously by radiology nursing throughout the procedure under my direct supervision. CONTRAST:  15 mL Isovue 300 - administered into the renal collecting system FLUOROSCOPY TIME:  3.3 minutes 11 mGy. COMPLICATIONS: None immediate. PROCEDURE: The procedure, risks, benefits, and alternatives were explained to the the patient and/or patient's representative, questions were encouraged and answered and informed consent was obtained. A timeout was performed prior to the initiation of the procedure. The operative site was prepped and draped in the usual sterile fashion and a sterile drape was applied covering the operative field. A sterile gown and sterile gloves were used for the procedure. Local anesthesia was provided with 1% Lidocaine with epinephrine. Ultrasound was used to localize the RIGHT kidney. Under direct ultrasound guidance, a 20 gauge needle was advanced into the renal collecting system. An ultrasound image documentation was performed. Access within the collecting system was confirmed with the efflux of  urine followed by limited contrast injection. Under intermittent fluoroscopic guidance, an 0.018 wire was advanced into the collecting system and the tract was dilated with an Accustick stent. Next, over a short Amplatz wire, the track was further dilated ultimately allowing placement of a 10-French percutaneous nephrostomy catheter with end coiled and locked within the renal pelvis. Contrast was injected and several spot fluoroscopic images were obtained in various obliquities. The catheter was secured at the skin entrance site with an interrupted suture and a stat lock device and connected to a gravity bag. Dressings were applied. The patient tolerated procedure well without immediate postprocedural complication. FINDINGS: Ultrasound scanning demonstrates a moderate to severely dilated RIGHT collecting system. A posterior inferior calix was targeted and allow placement of a 10-French percutaneous nephrostomy catheter with end coiled and locked within the RIGHT inferior pole renal collecting system. Contrast injection confirmed appropriate positioning. 5 mL of purulent urine was obtained and submitted for analysis. IMPRESSION: Successful placement of a RIGHT sided 10.2 Fr percutaneous nephrostomy tube. 5 mL of purulent urine was obtained and submitted for analysis. Michaelle Birks, MD [facility] Radiology Electronically Signed   By: Michaelle Birks M.D.   On: 04/14/2021 15:58    Anti-infectives: Anti-infectives (From admission, onward)    Start     Dose/Rate Route Frequency Ordered Stop   04/14/21 0345  cefTRIAXone (ROCEPHIN) 2 g in sodium chloride 0.9 % 100 mL IVPB        2 g 200 mL/hr over 30 Minutes Intravenous Every 24 hours 04/14/21 0343 04/21/21 0344       Assessment/Plan:  Improving quickly from urosepsis. Keep current neph tube, he will go home with this. Again discussed plan of Rt PCNL in elective setting with goal of stone free in several weeks.  Then restaging imaging and likely left nephrectomy after that for mass. He has very good understanding of goals of care and plan.   Agree with current rocephin pending additional CX data.  Would rec minimum DC criteria afebrile x 24 hours.  Greatly appreciate IR and hospitalist team and pharmacy comanagement.      Alexis Frock 04/15/2021

## 2021-04-15 NOTE — Progress Notes (Signed)
PHARMACY - PHYSICIAN COMMUNICATION CRITICAL VALUE ALERT - BLOOD CULTURE IDENTIFICATION (BCID)  Mitchell Hughes is an 55 y.o. male who presented to [facility] on 04/14/2021 with a chief complaint of fever.  Assessment:  Patient admitted with obstructing kidney stone and superimposed infection s/p right nephrostomy tube placement yesterday. Blood cx + GNR (BCID + klebsiella oxytoca with no resistance.   Patient is currently afebrile with improved hemodynamics.   Name of physician (or Provider) Contacted: X. Blount  Current antibiotics: Rocephin 2gm IV q24h  Changes to prescribed antibiotics recommended:  Patient is on recommended antibiotics - No changes needed  Results for orders placed or performed during the hospital encounter of 04/14/21  Blood Culture ID Panel (Reflexed) (Collected: 04/14/2021  3:55 AM)  Result Value Ref Range   Enterococcus faecalis NOT DETECTED NOT DETECTED   Enterococcus Faecium NOT DETECTED NOT DETECTED   Listeria monocytogenes NOT DETECTED NOT DETECTED   Staphylococcus species NOT DETECTED NOT DETECTED   Staphylococcus aureus (BCID) NOT DETECTED NOT DETECTED   Staphylococcus epidermidis NOT DETECTED NOT DETECTED   Staphylococcus lugdunensis NOT DETECTED NOT DETECTED   Streptococcus species NOT DETECTED NOT DETECTED   Streptococcus agalactiae NOT DETECTED NOT DETECTED   Streptococcus pneumoniae NOT DETECTED NOT DETECTED   Streptococcus pyogenes NOT DETECTED NOT DETECTED   A.calcoaceticus-baumannii NOT DETECTED NOT DETECTED   Bacteroides fragilis NOT DETECTED NOT DETECTED   Enterobacterales DETECTED (A) NOT DETECTED   Enterobacter cloacae complex NOT DETECTED NOT DETECTED   Escherichia coli NOT DETECTED NOT DETECTED   Klebsiella aerogenes NOT DETECTED NOT DETECTED   Klebsiella oxytoca DETECTED (A) NOT DETECTED   Klebsiella pneumoniae NOT DETECTED NOT DETECTED   Proteus species NOT DETECTED NOT DETECTED   Salmonella species NOT DETECTED NOT DETECTED    Serratia marcescens NOT DETECTED NOT DETECTED   Haemophilus influenzae NOT DETECTED NOT DETECTED   Neisseria meningitidis NOT DETECTED NOT DETECTED   Pseudomonas aeruginosa NOT DETECTED NOT DETECTED   Stenotrophomonas maltophilia NOT DETECTED NOT DETECTED   Candida albicans NOT DETECTED NOT DETECTED   Candida auris NOT DETECTED NOT DETECTED   Candida glabrata NOT DETECTED NOT DETECTED   Candida krusei NOT DETECTED NOT DETECTED   Candida parapsilosis NOT DETECTED NOT DETECTED   Candida tropicalis NOT DETECTED NOT DETECTED   Cryptococcus neoformans/gattii NOT DETECTED NOT DETECTED   CTX-M ESBL NOT DETECTED NOT DETECTED   Carbapenem resistance IMP NOT DETECTED NOT DETECTED   Carbapenem resistance KPC NOT DETECTED NOT DETECTED   Carbapenem resistance NDM NOT DETECTED NOT DETECTED   Carbapenem resist OXA 48 LIKE NOT DETECTED NOT DETECTED   Carbapenem resistance VIM NOT DETECTED NOT DETECTED    Netta Cedars, PharmD 04/15/2021  12:24 AM

## 2021-04-16 LAB — CBC
HCT: 36 % — ABNORMAL LOW (ref 39.0–52.0)
Hemoglobin: 11.9 g/dL — ABNORMAL LOW (ref 13.0–17.0)
MCH: 30.1 pg (ref 26.0–34.0)
MCHC: 33.1 g/dL (ref 30.0–36.0)
MCV: 90.9 fL (ref 80.0–100.0)
Platelets: 121 10*3/uL — ABNORMAL LOW (ref 150–400)
RBC: 3.96 MIL/uL — ABNORMAL LOW (ref 4.22–5.81)
RDW: 13.1 % (ref 11.5–15.5)
WBC: 8.5 10*3/uL (ref 4.0–10.5)
nRBC: 0 % (ref 0.0–0.2)

## 2021-04-16 LAB — PHOSPHORUS: Phosphorus: 2.7 mg/dL (ref 2.5–4.6)

## 2021-04-16 LAB — MAGNESIUM: Magnesium: 2.3 mg/dL (ref 1.7–2.4)

## 2021-04-16 LAB — BASIC METABOLIC PANEL
Anion gap: 7 (ref 5–15)
BUN: 18 mg/dL (ref 6–20)
CO2: 23 mmol/L (ref 22–32)
Calcium: 8.3 mg/dL — ABNORMAL LOW (ref 8.9–10.3)
Chloride: 107 mmol/L (ref 98–111)
Creatinine, Ser: 1.06 mg/dL (ref 0.61–1.24)
GFR, Estimated: >60 mL/min (ref >60)
Glucose, Bld: 131 mg/dL — ABNORMAL HIGH (ref 70–99)
Potassium: 4 mmol/L (ref 3.5–5.1)
Sodium: 137 mmol/L (ref 135–145)

## 2021-04-16 MED ORDER — SULFAMETHOXAZOLE-TRIMETHOPRIM 800-160 MG PO TABS
1.0000 | ORAL_TABLET | Freq: Two times a day (BID) | ORAL | Status: DC
  Administered 2021-04-17: 11:00:00 1 via ORAL
  Filled 2021-04-16: qty 1

## 2021-04-16 MED ORDER — SENNOSIDES-DOCUSATE SODIUM 8.6-50 MG PO TABS
1.0000 | ORAL_TABLET | Freq: Two times a day (BID) | ORAL | Status: DC
  Administered 2021-04-16 – 2021-04-17 (×3): 1 via ORAL
  Filled 2021-04-16 (×3): qty 1

## 2021-04-16 NOTE — Progress Notes (Signed)
Primary team informed of the dysuria and scant bleeding.

## 2021-04-16 NOTE — Progress Notes (Signed)
Referring Physician(s): Manny,T  Supervising Physician: Markus Daft  Patient Status:  [facility] - In-pt  Chief Complaint:  Recent right flank pain, renal stone, urosepsis  Subjective: Pt sitting up in chair; feeling better; has less flank pain; denies fever/chills/N/V   Allergies: Patient has no known allergies.  Medications: Prior to Admission medications   Medication Sig Start Date End Date Taking? Authorizing Provider  gabapentin (NEURONTIN) 300 MG capsule Take 1 capsule (300 mg total) by mouth at bedtime. 02/21/21  Yes Tysinger, Camelia Eng, PA-C  ibuprofen (ADVIL) 800 MG tablet Take 1 tablet (800 mg total) by mouth 3 (three) times daily. Patient taking differently: Take 800 mg by mouth every 8 (eight) hours as needed for moderate pain. 04/13/21  Yes Truddie Hidden, MD  oxyCODONE-acetaminophen (PERCOCET/ROXICET) 5-325 MG tablet Take 1 tablet by mouth every 6 (six) hours as needed for severe pain. 04/13/21  Yes Truddie Hidden, MD  sildenafil (VIAGRA) 100 MG tablet Take 1 tablet (100 mg total) by mouth daily as needed for erectile dysfunction. 02/14/21  Yes Tysinger, Camelia Eng, PA-C     Vital Signs: BP 119/63 (BP Location: Left Arm)   Pulse 69   Temp 98 F (36.7 C) (Oral)   Resp 17   Ht 6\' 5"  (1.956 m)   Wt 220 lb (99.8 kg)   SpO2 97%   BMI 26.09 kg/m   Physical Exam awake/alert; rt PCN intact, insertion site ok, mildly tender, OP 125 cc blood-tinged urine  Imaging: CT ABDOMEN PELVIS W CONTRAST  Result Date: 04/13/2021 CLINICAL DATA:  Right lower quadrant pain EXAM: CT ABDOMEN AND PELVIS WITH CONTRAST TECHNIQUE: Multidetector CT imaging of the abdomen and pelvis was performed using the standard protocol following bolus administration of intravenous contrast. CONTRAST:  126mL OMNIPAQUE IOHEXOL 300 MG/ML  SOLN COMPARISON:  CT abdomen and pelvis 09/28/2009 FINDINGS: Lower chest: Mild subsegmental atelectasis in the lung bases. Hepatobiliary: Liver is normal in size and  contour with no suspicious mass identified. Small hypodensity adjacent to the falciform ligament likely represents focal fatty infiltration. No gallstones, gallbladder wall thickening, or biliary dilatation. Pancreas: Unremarkable. No pancreatic ductal dilatation or surrounding inflammatory changes. Spleen: Enlarged measuring 14 cm in length. Adrenals/Urinary Tract: Adrenal glands appear normal. Urinary bladder appears within normal limits. Right kidney: Large calculus in the lower pole of the right kidney measuring up to 1.5 x 1.5 x 2.6 cm in axial and craniocaudal dimensions. The stone appears to obstruct the lower pole calices which are mild-to-moderately distended. There is relatively heterogeneous and diminished perfusion in the lower pole of the right kidney with adjacent mild fat stranding/fluid. No dilatation of the renal pelvis or ureter. 2.3 cm simple appearing exophytic cyst in the upper pole. Left kidney: No nephrolithiasis or hydronephrosis. There is a smoothly marginated partially exophytic mass at the medial aspect of the kidney which demonstrates inhomogeneous apparent mild diffuse enhancement and measures up to 3.9 x 6.5 x 6.3 cm in axial and craniocaudal dimensions, and is new since previous study. Stomach/Bowel: No bowel obstruction, free air or pneumatosis. No bowel wall edema. Appendix is normal. Vascular/Lymphatic: No bulky lymphadenopathy identified. Mild atherosclerotic disease. Reproductive: Prostate gland is enlarged. Other: No ascites. Small umbilical hernia containing fat. Small right inguinal hernia containing fat. Musculoskeletal: No suspicious bony lesions. IMPRESSION: 1. Large calculus in the lower pole of the right kidney causing obstruction of the lower pole calices. Mild perinephric fat stranding/fluid at the lower pole right kidney may be reactive and/or secondary to caliceal  rupture. Also correlation for possible superimposed infection recommended. 2. Solid-appearing exophytic  mass at the medial aspect of the left kidney which appears to demonstrate mild diffuse enhancement, concerning for malignancy. Consider follow-up with ultrasound and renal mass protocol MRI, and urology consultation is recommended. 3. Prostatomegaly. 4. Splenomegaly. Electronically Signed   By: Ofilia Neas M.D.   On: 04/13/2021 08:40   US RENAL  Result Date: 04/14/2021 CLINICAL DATA:  Abnormal CT scan EXAM: RENAL / URINARY TRACT ULTRASOUND COMPLETE COMPARISON:  CT abdomen and pelvis dated April 13, 2021 FINDINGS: Right Kidney: Renal measurements: 14.7 x 6.7 x 5.8 cm = volume: 299 mL. Simple cyst of the upper pole of the right kidney measuring 2.2 x 2.0 x 1.5 cm. Large calculus of the lower pole the right kidney measuring up to 2.4 cm. No hydronephrosis. Left Kidney: Renal measurements: 14.0 x 6.8 x 7.0 cm = volume: 346 mL. Solid mass of the lower pole left kidney measuring 3.1 x 4.0 x 4.4 cm. No hydronephrosis. Bladder: Appears normal for degree of bladder distention. Bilateral ureter jets visualized. Other: Suboptimal images due to bowel gas. IMPRESSION: 1. 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 Recommend further evaluation with renal protocol MRI. 2. Large calculus of the lower pole the right kidney measuring up to 2.4 cm. 3. No hydronephrosis. Caliectasis of the lower pole the right kidney which was seen on prior CT is not well visualized, likely due to overlying bowel gas. Electronically Signed   By: Yetta Glassman M.D.   On: 04/14/2021 11:25   DG Chest Port 1 View  Result Date: 04/14/2021 CLINICAL DATA:  55 year old male with possible sepsis. EXAM: PORTABLE CHEST 1 VIEW COMPARISON:  Chest radiographs 03/23/2014 and earlier. FINDINGS: Portable AP upright view at 0410 hours. Normal lung volumes and mediastinal contours. Visualized tracheal air column is within normal limits. Allowing for portable technique the lungs are clear. No  pneumothorax or pleural effusion. No acute osseous abnormality identified. IMPRESSION: Negative portable chest. Electronically Signed   By: Genevie Ann M.D.   On: 04/14/2021 04:29   IR NEPHROSTOMY PLACEMENT RIGHT  Result Date: 04/14/2021 INDICATION: RIGHT obstructing nephrolith. EXAM: ULTRASOUND AND FLUOROSCOPIC GUIDED PLACEMENT OF RIGHT NEPHROSTOMY TUBE COMPARISON:  CT AP, 04/13/2021. MEDICATIONS: The patient is on schedule IV antibiotics, with Rocephin. ANESTHESIA/SEDATION: Moderate (conscious) sedation was employed during this procedure. A total of Versed 2.5 mg and Fentanyl 150 mcg was administered intravenously. Moderate Sedation Time: 29 minutes. The patient's level of consciousness and vital signs were monitored continuously by radiology nursing throughout the procedure under my direct supervision. CONTRAST:  15 mL Isovue 300 - administered into the renal collecting system FLUOROSCOPY TIME:  3.3 minutes 11 mGy. COMPLICATIONS: None immediate. PROCEDURE: The procedure, risks, benefits, and alternatives were explained to the the patient and/or patient's representative, questions were encouraged and answered and informed consent was obtained. A timeout was performed prior to the initiation of the procedure. The operative site was prepped and draped in the usual sterile fashion and a sterile drape was applied covering the operative field. A sterile gown and sterile gloves were used for the procedure. Local anesthesia was provided with 1% Lidocaine with epinephrine. Ultrasound was used to localize the RIGHT kidney. Under direct ultrasound guidance, a 20 gauge needle was advanced into the renal collecting system. An ultrasound image documentation was performed. Access within the collecting system was confirmed with the efflux of urine followed by limited contrast injection.  Under intermittent fluoroscopic guidance, an 0.018 wire was advanced into the collecting system and the tract was dilated with an Accustick  stent. Next, over a short Amplatz wire, the track was further dilated ultimately allowing placement of a 10-French percutaneous nephrostomy catheter with end coiled and locked within the renal pelvis. Contrast was injected and several spot fluoroscopic images were obtained in various obliquities. The catheter was secured at the skin entrance site with an interrupted suture and a stat lock device and connected to a gravity bag. Dressings were applied. The patient tolerated procedure well without immediate postprocedural complication. FINDINGS: Ultrasound scanning demonstrates a moderate to severely dilated RIGHT collecting system. A posterior inferior calix was targeted and allow placement of a 10-French percutaneous nephrostomy catheter with end coiled and locked within the RIGHT inferior pole renal collecting system. Contrast injection confirmed appropriate positioning. 5 mL of purulent urine was obtained and submitted for analysis. IMPRESSION: Successful placement of a RIGHT sided 10.2 Fr percutaneous nephrostomy tube. 5 mL of purulent urine was obtained and submitted for analysis. Michaelle Birks, MD [facility] Radiology Electronically Signed   By: Michaelle Birks M.D.   On: 04/14/2021 15:58    Labs:  CBC: Recent Labs    04/13/21 0549 04/14/21 0354 04/15/21 0251 04/16/21 0252  WBC 13.1* 12.2* 8.3 8.5  HGB 15.9 15.2 12.1* 11.9*  HCT 44.3 44.1 36.0* 36.0*  PLT 245 182 103* 121*    COAGS: Recent Labs    04/14/21 0354 04/15/21 0251  INR 1.4* 1.6*  APTT 28  --     BMP: Recent Labs    04/14/21 0354 04/14/21 0923 04/15/21 0251 04/16/21 0252  NA 137 139 137 137  K 3.8 3.3* 3.9 4.0  CL 105 109 106 107  CO2 21* 24 23 23   GLUCOSE 155* 121* 128* 131*  BUN 28* 27* 22* 18  CALCIUM 9.2 8.7* 8.3* 8.3*  CREATININE 1.86* 1.58* 1.15 1.06  GFRNONAA 42* 51* >60 >60    LIVER FUNCTION TESTS: Recent Labs    02/21/21 0920 04/13/21 0549  04/14/21 0354 04/15/21 0251  BILITOT 0.5 0.5 2.8* 1.6*  AST 23 24 35 25  ALT 22 24 28 24   ALKPHOS 66 59 58 39  PROT 7.1 7.4 7.5 6.0*  ALBUMIN 4.9 4.5 4.2 3.1*    Assessment and Plan: Pt with hx large rt renal stone with focal obstruction/hydronephrosis, recent renal insufficiency, urosepsis; left renal mass; s/p rt PCN 12/11; afebrile; WBC nl; hgb 11.9(12.1), plts 121k, creat nl; urine cx- klebsiella; cont current tx as outlined by urology; await antbx sensitivities; hydrate   Electronically Signed: D. Rowe Robert, PA-C 04/16/2021, 9:12 AM   I spent a total of 15 Minutes at the the patient's bedside AND on the patient's hospital floor or unit, greater than 50% of which was counseling/coordinating care for right percutaneous nephrostomy    Patient ID: Mitchell Hughes, male   DOB: Sep 14, 1965, 55 y.o.   MRN: [id number]

## 2021-04-16 NOTE — Progress Notes (Addendum)
PROGRESS NOTE    Mitchell Hughes  XBJ:[id number] DOB: Dec 10, 1965 DOA: 04/14/2021 PCP: Carlena Hurl, PA-C    Brief Narrative:  This 55 years old male with PMH significant for nephrolithiasis presented to the ED with complaints of right lower quadrant abdominal pain associated with nausea, vomiting and fever. CT abdomen and pelvis shows large calculus in the lower pole of right kidney causing obstruction and possible superimposed infection.  There is also a solid-appearing exophytic mass in the medial aspect of left kidney demonstrate diffuse enhancement concerning for malignancy. Patient is admitted for severe sepsis secondary to UTI /infected nephrolithiasis.  Patient started on empiric antibiotics,  IV Rocephin, IV hydration.  Urology is consulted.  Patient underwent percutaneous nephrostomy tubes by IR on 12/11.  Patient has been doing much better.   Assessment & Plan:   Principal Problem:   Sepsis ([place]) Active Problems:   Nephrolithiasis   Acute lower UTI  Severe sepsis secondary to UTI: Patient presented with fever, tachycardia, tachypnea, leukocytosis, AKI,  lactic acidosis and hypotension.  UA consistent with UTI Continue Empiric antibiotics ceftriaxone. Continue IV hydration.  Hypotension has resolved with hydration. Start tapering of his stress dose of steroids. Sepsis physiology is improving. Urine culture grew Klebsiella oxytoca, sensitivity pending.  Right Nephrolithiasis with hydronephrosis.: Urology is consulted.  Recommended percutaneous nephrostomy tube. Patient underwent percutaneous nephrostomy tube by IR on 12/11. Continue adequate pain control and IV hydration. Patient can go home with nephrostomy tube and will be followed outpatient.  AKI: > Resolved. Suspect Multifactorial. Patient has been taking ibuprofen for chronic pain. Obstructing nephrolithiasis also contributing to AKI. Decreased p.o. intake with dehydration. Continue IV gentle hydration. Avoid  nephrotoxic medication   Left renal Mass on the CT scan: Urology is following needs outpatient follow-up once infection is over. Needs outpatient workup, staging and likely nephrectomy.    DVT prophylaxis: Lovenox Code Status: Full code. Family Communication: No family at bed side. Disposition Plan:   Status is: Inpatient  Remains inpatient appropriate because: Admitted for sepsis secondary to obstructing infected nephrolithiasis ,underwent percutaneous nephrostomy tube.  Sepsis physiology improving.   Patient medically cleared for discharge: No  Anticipated discharge home on 12/14 with  nephrostomy tube on oral antibiotics based on culture and sensitivity report which is pending.   Consultants:  Urology  Procedures: Percutaneous nephrostomy tube Antimicrobials:  Anti-infectives (From admission, onward)    Start     Dose/Rate Route Frequency Ordered Stop   04/14/21 0345  cefTRIAXone (ROCEPHIN) 2 g in sodium chloride 0.9 % 100 mL IVPB        2 g 200 mL/hr over 30 Minutes Intravenous Every 24 hours 04/14/21 0343 04/21/21 0344        Subjective: Patient was seen and examined at bedside.  Overnight events noted.   Patient reports feeling much improved.  He remains afebrile. He denies any pain at nephrostomy tube area.  Objective: Vitals:   04/16/21 0800 04/16/21 0900 04/16/21 1000 04/16/21 1223  BP: 136/66 119/63 123/62 105/63  Pulse: 76 69 67 63  Resp: 14 17 14 18   Temp: 98 F (36.7 C)   97.7 F (36.5 C)  TempSrc: Oral   Oral  SpO2: 92% 97% 97% 95%  Weight:      Height:        Intake/Output Summary (Last 24 hours) at 04/16/2021 1444 Last data filed at 04/16/2021 1323 Gross per 24 hour  Intake 1170 ml  Output 2850 ml  Net -1680 ml   [employer]  04/14/21 0336  Weight: 99.8 kg    Examination:  General exam: Appears comfortable, not in any acute distress, Respiratory system: Clear to auscultation bilaterally, respiratory effort normal, RR  15. Cardiovascular system: S1-S2 heard, regular rate and rhythm, no murmur. Gastrointestinal system: Abdomen is soft, nontender, nondistended, BS+. PCN tube noted on right side. Central nervous system: Alert and oriented X 3. No focal neurological deficits. Extremities: No edema, no cyanosis, no clubbing. Skin: No rashes, lesions or ulcers Psychiatry: Judgement and insight appear normal. Mood & affect appropriate.     Data Reviewed: I have personally reviewed following labs and imaging studies  CBC: Recent Labs  Lab 04/13/21 0549 04/14/21 0354 04/15/21 0251 04/16/21 0252  WBC 13.1* 12.2* 8.3 8.5  NEUTROABS 11.6* 10.9* 7.5  --   HGB 15.9 15.2 12.1* 11.9*  HCT 44.3 44.1 36.0* 36.0*  MCV 86.9 89.1 89.1 90.9  PLT 245 182 103* 509*   Basic Metabolic Panel: Recent Labs  Lab 04/13/21 0549 04/14/21 0354 04/14/21 0923 04/15/21 0251 04/16/21 0252  NA 136 137 139 137 137  K 4.0 3.8 3.3* 3.9 4.0  CL 107 105 109 106 107  CO2 19* 21* 24 23 23   GLUCOSE 122* 155* 121* 128* 131*  BUN 18 28* 27* 22* 18  CREATININE 1.19 1.86* 1.58* 1.15 1.06  CALCIUM 9.5 9.2 8.7* 8.3* 8.3*  MG  --   --   --  2.0 2.3  PHOS  --   --   --   --  2.7   GFR: Estimated Creatinine Clearance: 96.9 mL/min (by C-G formula based on SCr of 1.06 mg/dL). Liver Function Tests: Recent Labs  Lab 04/13/21 0549 04/14/21 0354 04/15/21 0251  AST 24 35 25  ALT 24 28 24   ALKPHOS 59 58 39  BILITOT 0.5 2.8* 1.6*  PROT 7.4 7.5 6.0*  ALBUMIN 4.5 4.2 3.1*   No results for input(s): LIPASE, AMYLASE in the last 168 hours. No results for input(s): AMMONIA in the last 168 hours. Coagulation Profile: Recent Labs  Lab 04/14/21 0354 04/15/21 0251  INR 1.4* 1.6*   Cardiac Enzymes: No results for input(s): CKTOTAL, CKMB, CKMBINDEX, TROPONINI in the last 168 hours. BNP (last 3 results) No results for input(s): PROBNP in the last 8760 hours. HbA1C: No results for input(s): HGBA1C in the last 72 hours. CBG: No  results for input(s): GLUCAP in the last 168 hours. Lipid Profile: No results for input(s): CHOL, HDL, LDLCALC, TRIG, CHOLHDL, LDLDIRECT in the last 72 hours. Thyroid Function Tests: No results for input(s): TSH, T4TOTAL, FREET4, T3FREE, THYROIDAB in the last 72 hours. Anemia Panel: No results for input(s): VITAMINB12, FOLATE, FERRITIN, TIBC, IRON, RETICCTPCT in the last 72 hours. Sepsis Labs: Recent Labs  Lab 04/14/21 0354 04/14/21 0532 04/14/21 0923 04/15/21 0251  PROCALCITON  --   --   --  46.05  LATICACIDVEN 3.8* 1.5 1.5  --     Recent Results (from the past 240 hour(s))  Blood Culture (routine x 2)     Status: Abnormal (Preliminary result)   Collection Time: 04/14/21  3:55 AM   Specimen: Right Antecubital; Blood  Result Value Ref Range Status   Specimen Description   Final    RIGHT ANTECUBITAL Performed at [facility], [address] [place], [place] [address]    Special Requests   Final    BOTTLES DRAWN AEROBIC AND ANAEROBIC Blood Culture adequate volume Performed at [facility], [address] [place], [place] [address]    Culture  Setup Time   Final    GRAM NEGATIVE RODS Gram Stain Report Called to,Read Back By and Verified With:  A. MANSFIELD @ 1713 04/14/21 BY STEPHTR IN BOTH AEROBIC AND ANAEROBIC BOTTLES CRITICAL RESULT CALLED TO, READ BACK BY AND VERIFIED WITH: M LILLISTON,PHARMD@0015  04/16/99    Culture (A)  Final    KLEBSIELLA OXYTOCA SUSCEPTIBILITIES TO FOLLOW Performed at [facility], [address] [place], [place] [address]    Report Status PENDING  Incomplete  Blood Culture ID Panel (Reflexed)     Status: Abnormal   Collection Time: 04/14/21  3:55 AM  Result Value Ref Range Status   Enterococcus faecalis NOT DETECTED NOT DETECTED Final   Enterococcus Faecium NOT DETECTED NOT DETECTED Final   Listeria monocytogenes NOT DETECTED NOT DETECTED Final   Staphylococcus species NOT DETECTED NOT DETECTED Final   Staphylococcus aureus (BCID) NOT  DETECTED NOT DETECTED Final   Staphylococcus epidermidis NOT DETECTED NOT DETECTED Final   Staphylococcus lugdunensis NOT DETECTED NOT DETECTED Final   Streptococcus species NOT DETECTED NOT DETECTED Final   Streptococcus agalactiae NOT DETECTED NOT DETECTED Final   Streptococcus pneumoniae NOT DETECTED NOT DETECTED Final   Streptococcus pyogenes NOT DETECTED NOT DETECTED Final   A.calcoaceticus-baumannii NOT DETECTED NOT DETECTED Final   Bacteroides fragilis NOT DETECTED NOT DETECTED Final   Enterobacterales DETECTED (A) NOT DETECTED Final    Comment: Enterobacterales represent a large order of gram negative bacteria, not a single organism. CRITICAL RESULT CALLED TO, READ BACK BY AND VERIFIED WITH: M LILLISTON,PHARMD@0014  04/15/21 [place]    Enterobacter cloacae complex NOT DETECTED NOT DETECTED Final   Escherichia coli NOT DETECTED NOT DETECTED Final   Klebsiella aerogenes NOT DETECTED NOT DETECTED Final   Klebsiella oxytoca DETECTED (A) NOT DETECTED Final    Comment: CRITICAL RESULT CALLED TO, READ BACK BY AND VERIFIED WITH: M LILLISTON,PHARMD@0014  04/15/21 [place]    Klebsiella pneumoniae NOT DETECTED NOT DETECTED Final   Proteus species NOT DETECTED NOT DETECTED Final   Salmonella species NOT DETECTED NOT DETECTED Final   Serratia marcescens NOT DETECTED NOT DETECTED Final   Haemophilus influenzae NOT DETECTED NOT DETECTED Final   Neisseria meningitidis NOT DETECTED NOT DETECTED Final   Pseudomonas aeruginosa NOT DETECTED NOT DETECTED Final   Stenotrophomonas maltophilia NOT DETECTED NOT DETECTED Final   Candida albicans NOT DETECTED NOT DETECTED Final   Candida auris NOT DETECTED NOT DETECTED Final   Candida glabrata NOT DETECTED NOT DETECTED Final   Candida krusei NOT DETECTED NOT DETECTED Final   Candida parapsilosis NOT DETECTED NOT DETECTED Final   Candida tropicalis NOT DETECTED NOT DETECTED Final   Cryptococcus neoformans/gattii NOT DETECTED NOT DETECTED Final   CTX-M ESBL NOT  DETECTED NOT DETECTED Final   Carbapenem resistance IMP NOT DETECTED NOT DETECTED Final   Carbapenem resistance KPC NOT DETECTED NOT DETECTED Final   Carbapenem resistance NDM NOT DETECTED NOT DETECTED Final   Carbapenem resist OXA 48 LIKE NOT DETECTED NOT DETECTED Final   Carbapenem resistance VIM NOT DETECTED NOT DETECTED Final    Comment: Performed at [facility], [address], [place], [place] [address]  Blood Culture (routine x 2)     Status: Abnormal (Preliminary result)   Collection Time: 04/14/21  3:57 AM   Specimen: Left Antecubital; Blood  Result Value Ref Range Status   Specimen Description   Final    LEFT ANTECUBITAL Performed at [facility], [address] [place], [place] [address]    Special Requests  Final    BOTTLES DRAWN AEROBIC AND ANAEROBIC Blood Culture adequate volume Performed at [facility], [address], [place], [place] [address]    Culture  Setup Time   Final    GRAM NEGATIVE RODS IN BOTH AEROBIC AND ANAEROBIC BOTTLES CRITICAL VALUE NOTED.  VALUE IS CONSISTENT WITH PREVIOUSLY REPORTED AND CALLED VALUE.    Culture (A)  Final    KLEBSIELLA OXYTOCA CULTURE REINCUBATED FOR BETTER GROWTH Performed at [facility], [place] [address] [place], [place] [address]    Report Status PENDING  Incomplete  Resp Panel by RT-PCR (Flu A&B, Covid) Nasopharyngeal Swab     Status: None   Collection Time: 04/14/21  4:03 AM   Specimen: Nasopharyngeal Swab; Nasopharyngeal(NP) swabs in vial transport medium  Result Value Ref Range Status   SARS Coronavirus 2 by RT PCR NEGATIVE NEGATIVE Final    Comment: (NOTE) SARS-CoV-2 target nucleic acids are NOT DETECTED.  The SARS-CoV-2 RNA is generally detectable in upper respiratory specimens during the acute phase of infection. The lowest concentration of SARS-CoV-2 viral copies this assay can detect is 138 copies/mL. A negative result does not preclude SARS-Cov-2 infection and should not be used as the sole basis  for treatment or other patient management decisions. A negative result may occur with  improper specimen collection/handling, submission of specimen other than nasopharyngeal swab, presence of viral mutation(s) within the areas targeted by this assay, and inadequate number of viral copies(<138 copies/mL). A negative result must be combined with clinical observations, patient history, and epidemiological information. The expected result is Negative.  Fact Sheet for Patients:  EntrepreneurPulse.com.au  Fact Sheet for Healthcare Providers:  IncredibleEmployment.be  This test is no t yet approved or cleared by the [place] FDA and  has been authorized for detection and/or diagnosis of SARS-CoV-2 by FDA under an Emergency Use Authorization (EUA). This EUA will remain  in effect (meaning this test can be used) for the duration of the COVID-19 declaration under Section 564(b)(1) of the Act, 21 U.S.C.section 360bbb-3(b)(1), unless the authorization is terminated  or revoked sooner.       Influenza A by PCR NEGATIVE NEGATIVE Final   Influenza B by PCR NEGATIVE NEGATIVE Final    Comment: (NOTE) The Xpert Xpress SARS-CoV-2/FLU/RSV plus assay is intended as an aid in the diagnosis of influenza from Nasopharyngeal swab specimens and should not be used as a sole basis for treatment. Nasal washings and aspirates are unacceptable for Xpert Xpress SARS-CoV-2/FLU/RSV testing.  Fact Sheet for Patients: EntrepreneurPulse.com.au  Fact Sheet for Healthcare Providers: IncredibleEmployment.be  This test is not yet approved or cleared by the [place] FDA and has been authorized for detection and/or diagnosis of SARS-CoV-2 by FDA under an Emergency Use Authorization (EUA). This EUA will remain in effect (meaning this test can be used) for the duration of the COVID-19 declaration under Section 564(b)(1) of the Act, 21  U.S.C. section 360bbb-3(b)(1), unless the authorization is terminated or revoked.  Performed at [facility], [address] [place], [place] [address]   Urine Culture     Status: None   Collection Time: 04/14/21  4:41 AM   Specimen: In/Out Cath Urine  Result Value Ref Range Status   Specimen Description   Final    IN/OUT CATH URINE Performed at [facility], [address] [place], [place] [address]    Special Requests   Final    NONE Performed at [facility], [address] [place], [place] [address]  Culture   Final    NO GROWTH Performed at [facility], [place] [address], [place], [place] [address]    Report Status 04/15/2021 FINAL  Final  MRSA Next Gen by PCR, Nasal     Status: None   Collection Time: 04/14/21  9:08 AM   Specimen: Nasal Mucosa; Nasal Swab  Result Value Ref Range Status   MRSA by PCR Next Gen NOT DETECTED NOT DETECTED Final    Comment: (NOTE) The GeneXpert MRSA Assay (FDA approved for NASAL specimens only), is one component of a comprehensive MRSA colonization surveillance program. It is not intended to diagnose MRSA infection nor to guide or monitor treatment for MRSA infections. Test performance is not FDA approved in patients less than 39 years old. Performed at [facility], [place] [address], [place], [place] [address]   Aerobic/Anaerobic Culture w Gram Stain (surgical/deep wound)     Status: None (Preliminary result)   Collection Time: 04/14/21 12:33 PM   Specimen: Kidney; Urine  Result Value Ref Range Status   Specimen Description   Final    KIDNEY RIGHT Performed at [place] [address] [place], [place] [address]    Special Requests   Final    NONE Performed at [facility], [place] [address], [place], [place] [address]    Gram Stain   Final    ABUNDANT WBC PRESENT,BOTH PMN AND MONONUCLEAR NO ORGANISMS SEEN Performed at [facility], [place] [address]  [place], [place] [address]    Culture   Final    ABUNDANT KLEBSIELLA OXYTOCA NO ANAEROBES ISOLATED; CULTURE IN PROGRESS FOR 5 DAYS    Report Status PENDING  Incomplete   Organism ID, Bacteria KLEBSIELLA OXYTOCA  Final      Susceptibility   Klebsiella oxytoca - MIC*    AMPICILLIN >=32 RESISTANT Resistant     CEFAZOLIN 8 SENSITIVE Sensitive     CEFEPIME <=0.12 SENSITIVE Sensitive     CEFTAZIDIME <=1 SENSITIVE Sensitive     CEFTRIAXONE <=0.25 SENSITIVE Sensitive     CIPROFLOXACIN <=0.25 SENSITIVE Sensitive     GENTAMICIN <=1 SENSITIVE Sensitive     IMIPENEM <=0.25 SENSITIVE Sensitive     TRIMETH/SULFA <=20 SENSITIVE Sensitive     AMPICILLIN/SULBACTAM 16 INTERMEDIATE Intermediate     PIP/TAZO <=4 SENSITIVE Sensitive     * ABUNDANT KLEBSIELLA OXYTOCA    Radiology Studies: No results found.  Scheduled Meds:  Chlorhexidine Gluconate Cloth  6 each Topical Daily   gabapentin  300 mg Oral QHS   heparin  5,000 Units Subcutaneous Q8H   hydrocortisone sod succinate (SOLU-CORTEF) inj  100 mg Intravenous Q12H   loratadine  10 mg Oral BID   senna-docusate  1 tablet Oral BID   sodium chloride flush  5 mL Intracatheter Q12H   Continuous Infusions:  cefTRIAXone (ROCEPHIN)  IV Stopped (04/16/21 0409)   methocarbamol (ROBAXIN) IV Stopped (04/14/21 1447)     LOS: 2 days    Time spent: 25 mins    Tasean Mancha, MD [facility]   If 7PM-7AM, please contact night-coverage

## 2021-04-16 NOTE — TOC CM/SW Note (Signed)
°  Transition of Care [facility]) Screening Note   Patient Details  Name: Mitchell Hughes Date of Birth: July 06, 1965   Transition of Care [facility]) CM/SW Contact:    Ross Ludwig, LCSW Phone Number: 04/16/2021, 10:33 AM    Transition of Care Department [facility]) has reviewed patient and no TOC needs have been identified at this time. We will continue to monitor patient advancement through interdisciplinary progression rounds. If new patient transition needs arise, please place a TOC consult.

## 2021-04-17 DIAGNOSIS — R6520 Severe sepsis without septic shock: Secondary | ICD-10-CM

## 2021-04-17 DIAGNOSIS — N200 Calculus of kidney: Secondary | ICD-10-CM

## 2021-04-17 DIAGNOSIS — A419 Sepsis, unspecified organism: Secondary | ICD-10-CM

## 2021-04-17 DIAGNOSIS — N390 Urinary tract infection, site not specified: Secondary | ICD-10-CM

## 2021-04-17 DIAGNOSIS — N2889 Other specified disorders of kidney and ureter: Secondary | ICD-10-CM

## 2021-04-17 DIAGNOSIS — N179 Acute kidney failure, unspecified: Secondary | ICD-10-CM

## 2021-04-17 LAB — BASIC METABOLIC PANEL
Anion gap: 8 (ref 5–15)
BUN: 21 mg/dL — ABNORMAL HIGH (ref 6–20)
CO2: 22 mmol/L (ref 22–32)
Calcium: 8.3 mg/dL — ABNORMAL LOW (ref 8.9–10.3)
Chloride: 106 mmol/L (ref 98–111)
Creatinine, Ser: 1.13 mg/dL (ref 0.61–1.24)
GFR, Estimated: >60 mL/min (ref >60)
Glucose, Bld: 97 mg/dL (ref 70–99)
Potassium: 3.6 mmol/L (ref 3.5–5.1)
Sodium: 136 mmol/L (ref 135–145)

## 2021-04-17 LAB — CBC
HCT: 36.5 % — ABNORMAL LOW (ref 39.0–52.0)
Hemoglobin: 11.9 g/dL — ABNORMAL LOW (ref 13.0–17.0)
MCH: 29.5 pg (ref 26.0–34.0)
MCHC: 32.6 g/dL (ref 30.0–36.0)
MCV: 90.3 fL (ref 80.0–100.0)
Platelets: 132 10*3/uL — ABNORMAL LOW (ref 150–400)
RBC: 4.04 MIL/uL — ABNORMAL LOW (ref 4.22–5.81)
RDW: 13 % (ref 11.5–15.5)
WBC: 6.6 10*3/uL (ref 4.0–10.5)
nRBC: 0 % (ref 0.0–0.2)

## 2021-04-17 LAB — CULTURE, BLOOD (ROUTINE X 2)
Special Requests: ADEQUATE
Special Requests: ADEQUATE

## 2021-04-17 MED ORDER — SULFAMETHOXAZOLE-TRIMETHOPRIM 800-160 MG PO TABS
1.0000 | ORAL_TABLET | Freq: Two times a day (BID) | ORAL | 0 refills | Status: AC
Start: 2021-04-17 — End: 2021-04-24

## 2021-04-17 NOTE — Discharge Summary (Signed)
Physician Discharge Summary  Mitchell Hughes FKC:[id number] DOB: Jan 31, 1966 DOA: 04/14/2021  PCP: Carlena Hurl, PA-C  Admit date: 04/14/2021 Discharge date: 04/17/2021  Admitted From: Home  Discharge disposition: Home  Recommendations for Outpatient Follow-Up:   Follow up with your primary care provider in one week.  Check CBC, BMP, magnesium in the next visit Follow-up with urology for definitive treatment of kidney stone and management of percutaneous nephrostomy  Discharge Diagnosis:   Principal Problem:   Sepsis ([place]) Active Problems:   Nephrolithiasis   Acute lower UTI   Discharge Condition: Improved.  Diet recommendation:  Regular.  Wound care: None.  Code status: Full.   History of Present Illness:   This 55 years old male with past medical history of nephrolithiasis presented hospital with right lower quadrant pain with nausea vomiting and fever.  CT scan of the abdomen pelvis showed large calculus in the lower pole of right kidney causing obstruction and superimposed infection.  There was also note of solid appearing exophytic mass in the medial aspect of the left kidney with diffuse enhancement concerning for malignancy.  Patient was then admitted to hospital for severe sepsis secondary to UTI with infected nephrolithiasis.  Patient was started on empiric antibiotic IV Rocephin and urology was consulted.    Hospital Course:   Following conditions were addressed during hospitalization as listed below,  Severe sepsis secondary to UTI: Urine culture grew Klebsiella oxytoca, considered for Bactrim.  Patient initially was on IV Rocephin.  This will be changed to Bactrim on discharge to complete the course.   Right Nephrolithiasis with hydronephrosis.: Urology was consulted and patient underwent percutaneous nephrostomy placement on 12/11 by interventional radiology.  Patient will continue nephrostomy tube and will be followed by urology as outpatient.  Spoke  with Dr. Matilde Sprang urology who recommended continuation of PCN tube and follow-up in the clinic with Dr Tresa Moore.  AKI: > Resolved.   Was multifactorial likely secondary to NSAIDs and obstructive uropathy.  Has improved at this time.  Creatinine of 1.1 prior to discharge   Left renal Mass on the CT scan: Possibility of malignant mass.  Will need outpatient follow-up with urology for definite management patient might need surgical intervention.  Disposition.  At this time, patient is stable for disposition home with outpatient PCP and urology follow-up with  Medical Consultants:   Urology.  Procedures:    Percutaneous nephrostomy placement by interventional radiology on 04/14/2021 Subjective:   Today, patient was seen and examined at bedside.  Denies any nausea vomiting fever chills or rigor.  Discharge Exam:   Vitals:   04/17/21 1100 04/17/21 1200  BP: (!) 108/51 (!) 102/52  Pulse: 66   Resp: 19 16  Temp:    SpO2: 94%    Vitals:   04/17/21 0900 04/17/21 1000 04/17/21 1100 04/17/21 1200  BP: (!) 105/50 (!) 101/50 (!) 108/51 (!) 102/52  Pulse: 66 65 66   Resp: (!) 22 (!) 25 19 16   Temp:      TempSrc:      SpO2: 96% 93% 94%   Weight:      Height:        General: Alert awake, not in obvious distress HENT: pupils equally reacting to light,  No scleral pallor or icterus noted. Oral mucosa is moist.  Chest:  Clear breath sounds.  Diminished breath sounds bilaterally. No crackles or wheezes.  CVS: S1 &S2 heard. No murmur.  Regular rate and rhythm. Abdomen: Soft, nontender, nondistended.  Bowel sounds are  heard.  Right-sided percutaneous nephrostomy in place. Extremities: No cyanosis, clubbing or edema.  Peripheral pulses are palpable. Psych: Alert, awake and oriented, normal mood CNS:  No cranial nerve deficits.  Power equal in all extremities.   Skin: Warm and dry.  No rashes noted.  The results of significant diagnostics from this hospitalization (including imaging,  microbiology, ancillary and laboratory) are listed below for reference.     Diagnostic Studies:   US RENAL  Result Date: 04/14/2021 CLINICAL DATA:  Abnormal CT scan EXAM: RENAL / URINARY TRACT ULTRASOUND COMPLETE COMPARISON:  CT abdomen and pelvis dated April 13, 2021 FINDINGS: Right Kidney: Renal measurements: 14.7 x 6.7 x 5.8 cm = volume: 299 mL. Simple cyst of the upper pole of the right kidney measuring 2.2 x 2.0 x 1.5 cm. Large calculus of the lower pole the right kidney measuring up to 2.4 cm. No hydronephrosis. Left Kidney: Renal measurements: 14.0 x 6.8 x 7.0 cm = volume: 346 mL. Solid mass of the lower pole left kidney measuring 3.1 x 4.0 x 4.4 cm. No hydronephrosis. Bladder: Appears normal for degree of bladder distention. Bilateral ureter jets visualized. Other: Suboptimal images due to bowel gas. IMPRESSION: 1. 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 Recommend further evaluation with renal protocol MRI. 2. Large calculus of the lower pole the right kidney measuring up to 2.4 cm. 3. No hydronephrosis. Caliectasis of the lower pole the right kidney which was seen on prior CT is not well visualized, likely due to overlying bowel gas. Electronically Signed   By: Yetta Glassman M.D.   On: 04/14/2021 11:25   DG Chest Port 1 View  Result Date: 04/14/2021 CLINICAL DATA:  55 year old male with possible sepsis. EXAM: PORTABLE CHEST 1 VIEW COMPARISON:  Chest radiographs 03/23/2014 and earlier. FINDINGS: Portable AP upright view at 0410 hours. Normal lung volumes and mediastinal contours. Visualized tracheal air column is within normal limits. Allowing for portable technique the lungs are clear. No pneumothorax or pleural effusion. No acute osseous abnormality identified. IMPRESSION: Negative portable chest. Electronically Signed   By: Genevie Ann M.D.   On: 04/14/2021 04:29   IR NEPHROSTOMY PLACEMENT RIGHT  Result Date:  04/14/2021 INDICATION: RIGHT obstructing nephrolith. EXAM: ULTRASOUND AND FLUOROSCOPIC GUIDED PLACEMENT OF RIGHT NEPHROSTOMY TUBE COMPARISON:  CT AP, 04/13/2021. MEDICATIONS: The patient is on schedule IV antibiotics, with Rocephin. ANESTHESIA/SEDATION: Moderate (conscious) sedation was employed during this procedure. A total of Versed 2.5 mg and Fentanyl 150 mcg was administered intravenously. Moderate Sedation Time: 29 minutes. The patient's level of consciousness and vital signs were monitored continuously by radiology nursing throughout the procedure under my direct supervision. CONTRAST:  15 mL Isovue 300 - administered into the renal collecting system FLUOROSCOPY TIME:  3.3 minutes 11 mGy. COMPLICATIONS: None immediate. PROCEDURE: The procedure, risks, benefits, and alternatives were explained to the the patient and/or patient's representative, questions were encouraged and answered and informed consent was obtained. A timeout was performed prior to the initiation of the procedure. The operative site was prepped and draped in the usual sterile fashion and a sterile drape was applied covering the operative field. A sterile gown and sterile gloves were used for the procedure. Local anesthesia was provided with 1% Lidocaine with epinephrine. Ultrasound was used to localize the RIGHT kidney. Under direct ultrasound guidance, a 20 gauge needle was advanced into the renal collecting system. An ultrasound image documentation was performed. Access within the collecting system was  confirmed with the efflux of urine followed by limited contrast injection. Under intermittent fluoroscopic guidance, an 0.018 wire was advanced into the collecting system and the tract was dilated with an Accustick stent. Next, over a short Amplatz wire, the track was further dilated ultimately allowing placement of a 10-French percutaneous nephrostomy catheter with end coiled and locked within the renal pelvis. Contrast was injected and  several spot fluoroscopic images were obtained in various obliquities. The catheter was secured at the skin entrance site with an interrupted suture and a stat lock device and connected to a gravity bag. Dressings were applied. The patient tolerated procedure well without immediate postprocedural complication. FINDINGS: Ultrasound scanning demonstrates a moderate to severely dilated RIGHT collecting system. A posterior inferior calix was targeted and allow placement of a 10-French percutaneous nephrostomy catheter with end coiled and locked within the RIGHT inferior pole renal collecting system. Contrast injection confirmed appropriate positioning. 5 mL of purulent urine was obtained and submitted for analysis. IMPRESSION: Successful placement of a RIGHT sided 10.2 Fr percutaneous nephrostomy tube. 5 mL of purulent urine was obtained and submitted for analysis. Michaelle Birks, MD [facility] Radiology Electronically Signed   By: Michaelle Birks M.D.   On: 04/14/2021 15:58     Labs:   Basic Metabolic Panel: Recent Labs  Lab 04/14/21 0354 04/14/21 0923 04/15/21 0251 04/16/21 0252 04/17/21 0234  NA 137 139 137 137 136  K 3.8 3.3* 3.9 4.0 3.6  CL 105 109 106 107 106  CO2 21* 24 23 23 22   GLUCOSE 155* 121* 128* 131* 97  BUN 28* 27* 22* 18 21*  CREATININE 1.86* 1.58* 1.15 1.06 1.13  CALCIUM 9.2 8.7* 8.3* 8.3* 8.3*  MG  --   --  2.0 2.3  --   PHOS  --   --   --  2.7  --    GFR Estimated Creatinine Clearance: 90.9 mL/min (by C-G formula based on SCr of 1.13 mg/dL). Liver Function Tests: Recent Labs  Lab 04/13/21 0549 04/14/21 0354 04/15/21 0251  AST 24 35 25  ALT 24 28 24   ALKPHOS 59 58 39  BILITOT 0.5 2.8* 1.6*  PROT 7.4 7.5 6.0*  ALBUMIN 4.5 4.2 3.1*   No results for input(s): LIPASE, AMYLASE in the last 168 hours. No results for input(s): AMMONIA in the last 168 hours. Coagulation profile Recent Labs  Lab 04/14/21 0354 04/15/21 0251   INR 1.4* 1.6*    CBC: Recent Labs  Lab 04/13/21 0549 04/14/21 0354 04/15/21 0251 04/16/21 0252 04/17/21 0234  WBC 13.1* 12.2* 8.3 8.5 6.6  NEUTROABS 11.6* 10.9* 7.5  --   --   HGB 15.9 15.2 12.1* 11.9* 11.9*  HCT 44.3 44.1 36.0* 36.0* 36.5*  MCV 86.9 89.1 89.1 90.9 90.3  PLT 245 182 103* 121* 132*   Cardiac Enzymes: No results for input(s): CKTOTAL, CKMB, CKMBINDEX, TROPONINI in the last 168 hours. BNP: Invalid input(s): POCBNP CBG: No results for input(s): GLUCAP in the last 168 hours. D-Dimer No results for input(s): DDIMER in the last 72 hours. Hgb A1c No results for input(s): HGBA1C in the last 72 hours. Lipid Profile No results for input(s): CHOL, HDL, LDLCALC, TRIG, CHOLHDL, LDLDIRECT in the last 72 hours. Thyroid function studies No results for input(s): TSH, T4TOTAL, T3FREE, THYROIDAB in the last 72 hours.  Invalid input(s): FREET3 Anemia work up No results for input(s): VITAMINB12, FOLATE, FERRITIN, TIBC, IRON, RETICCTPCT in the last 72 hours. Microbiology Recent Results (from the  past 240 hour(s))  Blood Culture (routine x 2)     Status: Abnormal   Collection Time: 04/14/21  3:55 AM   Specimen: Right Antecubital; Blood  Result Value Ref Range Status   Specimen Description   Final    RIGHT ANTECUBITAL Performed at [facility], [address], [place], [place] [address]    Special Requests   Final    BOTTLES DRAWN AEROBIC AND ANAEROBIC Blood Culture adequate volume Performed at [facility], [address], [place], [place] [address]    Culture  Setup Time   Final    GRAM NEGATIVE RODS Gram Stain Report Called to,Read Back By and Verified With:  A. MANSFIELD @ 1713 04/14/21 BY STEPHTR IN BOTH AEROBIC AND ANAEROBIC BOTTLES CRITICAL RESULT CALLED TO, READ BACK BY AND VERIFIED WITH: M LILLISTON,PHARMD@0015  04/16/99 Performed at [facility], [address], [place], [place] [address]    Culture KLEBSIELLA OXYTOCA (A)  Final   Report Status  04/17/2021 FINAL  Final   Organism ID, Bacteria KLEBSIELLA OXYTOCA  Final      Susceptibility   Klebsiella oxytoca - MIC*    AMPICILLIN >=32 RESISTANT Resistant     CEFAZOLIN 8 SENSITIVE Sensitive     CEFEPIME <=0.12 SENSITIVE Sensitive     CEFTAZIDIME <=1 SENSITIVE Sensitive     CEFTRIAXONE <=0.25 SENSITIVE Sensitive     CIPROFLOXACIN <=0.25 SENSITIVE Sensitive     GENTAMICIN <=1 SENSITIVE Sensitive     IMIPENEM <=0.25 SENSITIVE Sensitive     TRIMETH/SULFA <=20 SENSITIVE Sensitive     AMPICILLIN/SULBACTAM 16 INTERMEDIATE Intermediate     PIP/TAZO <=4 SENSITIVE Sensitive     * KLEBSIELLA OXYTOCA  Blood Culture ID Panel (Reflexed)     Status: Abnormal   Collection Time: 04/14/21  3:55 AM  Result Value Ref Range Status   Enterococcus faecalis NOT DETECTED NOT DETECTED Final   Enterococcus Faecium NOT DETECTED NOT DETECTED Final   Listeria monocytogenes NOT DETECTED NOT DETECTED Final   Staphylococcus species NOT DETECTED NOT DETECTED Final   Staphylococcus aureus (BCID) NOT DETECTED NOT DETECTED Final   Staphylococcus epidermidis NOT DETECTED NOT DETECTED Final   Staphylococcus lugdunensis NOT DETECTED NOT DETECTED Final   Streptococcus species NOT DETECTED NOT DETECTED Final   Streptococcus agalactiae NOT DETECTED NOT DETECTED Final   Streptococcus pneumoniae NOT DETECTED NOT DETECTED Final   Streptococcus pyogenes NOT DETECTED NOT DETECTED Final   A.calcoaceticus-baumannii NOT DETECTED NOT DETECTED Final   Bacteroides fragilis NOT DETECTED NOT DETECTED Final   Enterobacterales DETECTED (A) NOT DETECTED Final    Comment: Enterobacterales represent a large order of gram negative bacteria, not a single organism. CRITICAL RESULT CALLED TO, READ BACK BY AND VERIFIED WITH: M LILLISTON,[email or web address]@0014  04/15/21 [place]    Enterobacter cloacae complex NOT DETECTED NOT DETECTED Final   Escherichia coli NOT DETECTED NOT DETECTED Final   Klebsiella aerogenes NOT DETECTED NOT DETECTED Final    Klebsiella oxytoca DETECTED (A) NOT DETECTED Final    Comment: CRITICAL RESULT CALLED TO, READ BACK BY AND VERIFIED WITH: M LILLISTON,PHARMD@0014  04/15/21 [place]    Klebsiella pneumoniae NOT DETECTED NOT DETECTED Final   Proteus species NOT DETECTED NOT DETECTED Final   Salmonella species NOT DETECTED NOT DETECTED Final   Serratia marcescens NOT DETECTED NOT DETECTED Final   Haemophilus influenzae NOT DETECTED NOT DETECTED Final   Neisseria meningitidis NOT DETECTED NOT DETECTED Final   Pseudomonas aeruginosa NOT DETECTED NOT DETECTED Final   Stenotrophomonas maltophilia NOT DETECTED NOT DETECTED Final  Candida albicans NOT DETECTED NOT DETECTED Final   Candida auris NOT DETECTED NOT DETECTED Final   Candida glabrata NOT DETECTED NOT DETECTED Final   Candida krusei NOT DETECTED NOT DETECTED Final   Candida parapsilosis NOT DETECTED NOT DETECTED Final   Candida tropicalis NOT DETECTED NOT DETECTED Final   Cryptococcus neoformans/gattii NOT DETECTED NOT DETECTED Final   CTX-M ESBL NOT DETECTED NOT DETECTED Final   Carbapenem resistance IMP NOT DETECTED NOT DETECTED Final   Carbapenem resistance KPC NOT DETECTED NOT DETECTED Final   Carbapenem resistance NDM NOT DETECTED NOT DETECTED Final   Carbapenem resist OXA 48 LIKE NOT DETECTED NOT DETECTED Final   Carbapenem resistance VIM NOT DETECTED NOT DETECTED Final    Comment: Performed at [facility], [address] [place], [place] [address]  Blood Culture (routine x 2)     Status: Abnormal   Collection Time: 04/14/21  3:57 AM   Specimen: Left Antecubital; Blood  Result Value Ref Range Status   Specimen Description   Final    LEFT ANTECUBITAL Performed at [facility], [address] [place], [place] [address]    Special Requests   Final    BOTTLES DRAWN AEROBIC AND ANAEROBIC Blood Culture adequate volume Performed at [facility], [address], [place], [place] [address]    Culture  Setup Time   Final    GRAM NEGATIVE  RODS IN BOTH AEROBIC AND ANAEROBIC BOTTLES CRITICAL VALUE NOTED.  VALUE IS CONSISTENT WITH PREVIOUSLY REPORTED AND CALLED VALUE.    Culture (A)  Final    KLEBSIELLA OXYTOCA SUSCEPTIBILITIES PERFORMED ON PREVIOUS CULTURE WITHIN THE LAST 5 DAYS. Performed at [facility], [place] [address], [place], [place] [address]    Report Status 04/17/2021 FINAL  Final  Resp Panel by RT-PCR (Flu A&B, Covid) Nasopharyngeal Swab     Status: None   Collection Time: 04/14/21  4:03 AM   Specimen: Nasopharyngeal Swab; Nasopharyngeal(NP) swabs in vial transport medium  Result Value Ref Range Status   SARS Coronavirus 2 by RT PCR NEGATIVE NEGATIVE Final    Comment: (NOTE) SARS-CoV-2 target nucleic acids are NOT DETECTED.  The SARS-CoV-2 RNA is generally detectable in upper respiratory specimens during the acute phase of infection. The lowest concentration of SARS-CoV-2 viral copies this assay can detect is 138 copies/mL. A negative result does not preclude SARS-Cov-2 infection and should not be used as the sole basis for treatment or other patient management decisions. A negative result may occur with  improper specimen collection/handling, submission of specimen other than nasopharyngeal swab, presence of viral mutation(s) within the areas targeted by this assay, and inadequate number of viral copies(<138 copies/mL). A negative result must be combined with clinical observations, patient history, and epidemiological information. The expected result is Negative.  Fact Sheet for Patients:  EntrepreneurPulse.com.au  Fact Sheet for Healthcare Providers:  IncredibleEmployment.be  This test is no t yet approved or cleared by the [place] FDA and  has been authorized for detection and/or diagnosis of SARS-CoV-2 by FDA under an Emergency Use Authorization (EUA). This EUA will remain  in effect (meaning this test can be used) for the duration of the COVID-19  declaration under Section 564(b)(1) of the Act, 21 U.S.C.section 360bbb-3(b)(1), unless the authorization is terminated  or revoked sooner.       Influenza A by PCR NEGATIVE NEGATIVE Final   Influenza B by PCR NEGATIVE NEGATIVE Final    Comment: (NOTE) The Xpert Xpress SARS-CoV-2/FLU/RSV plus assay is  intended as an aid in the diagnosis of influenza from Nasopharyngeal swab specimens and should not be used as a sole basis for treatment. Nasal washings and aspirates are unacceptable for Xpert Xpress SARS-CoV-2/FLU/RSV testing.  Fact Sheet for Patients: EntrepreneurPulse.com.au  Fact Sheet for Healthcare Providers: IncredibleEmployment.be  This test is not yet approved or cleared by the [place] FDA and has been authorized for detection and/or diagnosis of SARS-CoV-2 by FDA under an Emergency Use Authorization (EUA). This EUA will remain in effect (meaning this test can be used) for the duration of the COVID-19 declaration under Section 564(b)(1) of the Act, 21 U.S.C. section 360bbb-3(b)(1), unless the authorization is terminated or revoked.  Performed at [facility], [address] [place], [place] [address]   Urine Culture     Status: None   Collection Time: 04/14/21  4:41 AM   Specimen: In/Out Cath Urine  Result Value Ref Range Status   Specimen Description   Final    IN/OUT CATH URINE Performed at [facility], [address] [place], [place] [address]    Special Requests   Final    NONE Performed at [facility], [address], [place], [place] [address]    Culture   Final    NO GROWTH Performed at [facility], [place] [address], [place], [place] [address]    Report Status 04/15/2021 FINAL  Final  MRSA Next Gen by PCR, Nasal     Status: None   Collection Time: 04/14/21  9:08 AM   Specimen: Nasal Mucosa; Nasal Swab  Result Value Ref Range Status   MRSA by PCR Next Gen NOT DETECTED NOT DETECTED Final    Comment:  (NOTE) The GeneXpert MRSA Assay (FDA approved for NASAL specimens only), is one component of a comprehensive MRSA colonization surveillance program. It is not intended to diagnose MRSA infection nor to guide or monitor treatment for MRSA infections. Test performance is not FDA approved in patients less than 12 years old. Performed at [facility], [place] [address], [place], [place] [address]   Aerobic/Anaerobic Culture w Gram Stain (surgical/deep wound)     Status: None (Preliminary result)   Collection Time: 04/14/21 12:33 PM   Specimen: Kidney; Urine  Result Value Ref Range Status   Specimen Description   Final    KIDNEY RIGHT Performed at [place] [address], [place], [place] [address]    Special Requests   Final    NONE Performed at [facility], [place] [address] [place], [place] [address]    Gram Stain   Final    ABUNDANT WBC PRESENT,BOTH PMN AND MONONUCLEAR NO ORGANISMS SEEN Performed at [facility], [place] [address], [place], [place] [address]    Culture   Final    ABUNDANT KLEBSIELLA OXYTOCA NO ANAEROBES ISOLATED; CULTURE IN PROGRESS FOR 5 DAYS    Report Status PENDING  Incomplete   Organism ID, Bacteria KLEBSIELLA OXYTOCA  Final      Susceptibility   Klebsiella oxytoca - MIC*    AMPICILLIN >=32 RESISTANT Resistant     CEFAZOLIN 8 SENSITIVE Sensitive     CEFEPIME <=0.12 SENSITIVE Sensitive     CEFTAZIDIME <=1 SENSITIVE Sensitive     CEFTRIAXONE <=0.25 SENSITIVE Sensitive     CIPROFLOXACIN <=0.25 SENSITIVE Sensitive     GENTAMICIN <=1 SENSITIVE Sensitive     IMIPENEM <=0.25 SENSITIVE Sensitive     TRIMETH/SULFA <=20 SENSITIVE Sensitive     AMPICILLIN/SULBACTAM 16  INTERMEDIATE Intermediate     PIP/TAZO <=4 SENSITIVE Sensitive     * ABUNDANT KLEBSIELLA OXYTOCA     Discharge Instructions:   Discharge Instructions     Call MD for:  persistant nausea and vomiting   Complete by: As directed     Call MD for:  severe uncontrolled pain   Complete by: As directed    Call MD for:  temperature >100.4   Complete by: As directed    Diet - low sodium heart healthy   Complete by: As directed    Discharge instructions   Complete by: As directed    Follow up with your primary care provider in 1 week.  Complete course of antibiotic.  Seek medical attention for worsening symptoms. Follow up with Dr Tresa Moore in the urology clinic for definitive management of your stone.  Please call the office to set up an appointment.  Increase fluid intake.   Increase activity slowly   Complete by: As directed    No wound care   Complete by: As directed       Allergies as of 04/17/2021   No Known Allergies      Medication List     TAKE these medications    gabapentin 300 MG capsule Commonly known as: NEURONTIN Take 1 capsule (300 mg total) by mouth at bedtime.   ibuprofen 800 MG tablet Commonly known as: ADVIL Take 1 tablet (800 mg total) by mouth 3 (three) times daily. What changed:  when to take this reasons to take this   oxyCODONE-acetaminophen 5-325 MG tablet Commonly known as: PERCOCET/ROXICET Take 1 tablet by mouth every 6 (six) hours as needed for severe pain.   sildenafil 100 MG tablet Commonly known as: VIAGRA Take 1 tablet (100 mg total) by mouth daily as needed for erectile dysfunction.   sulfamethoxazole-trimethoprim 800-160 MG tablet Commonly known as: BACTRIM DS Take 1 tablet by mouth 2 (two) times daily for 7 days.        Follow-up Information     Tysinger, Camelia Eng, PA-C Follow up in 1 week(s).   Specialty: Family Medicine Contact information: [address] [place] [address] [phone]         Alexis Frock, MD. Call.   Specialty: Urology Why: for follow up appointment Contact information: [place] [address] [phone]                  Time coordinating discharge: 39 minutes  Signed:  Linah Klapper  [facility] 04/17/2021, 12:10 PM

## 2021-04-17 NOTE — Progress Notes (Signed)
Liane Comber RN gave patient his discharge instructions. Patient verbalized understanding of teaching and new medication. Patients Ivs were removed by Alejandra NT. Patient was taken down in a wheelchair by Alejandra NT @ 1325  where he was picked up by his friend.

## 2021-04-19 ENCOUNTER — Other Ambulatory Visit: Payer: Self-pay

## 2021-04-19 ENCOUNTER — Ambulatory Visit: Payer: BC Managed Care – PPO | Admitting: Medical

## 2021-04-19 ENCOUNTER — Encounter: Payer: Self-pay | Admitting: Medical

## 2021-04-19 VITALS — BP 120/70 | HR 72 | Temp 97.2°F | Wt 223.0 lb

## 2021-04-19 DIAGNOSIS — R161 Splenomegaly, not elsewhere classified: Secondary | ICD-10-CM

## 2021-04-19 DIAGNOSIS — R6520 Severe sepsis without septic shock: Secondary | ICD-10-CM

## 2021-04-19 DIAGNOSIS — N2889 Other specified disorders of kidney and ureter: Secondary | ICD-10-CM | POA: Diagnosis not present

## 2021-04-19 DIAGNOSIS — N200 Calculus of kidney: Secondary | ICD-10-CM | POA: Diagnosis not present

## 2021-04-19 DIAGNOSIS — A419 Sepsis, unspecified organism: Secondary | ICD-10-CM | POA: Diagnosis not present

## 2021-04-19 LAB — AEROBIC/ANAEROBIC CULTURE W GRAM STAIN (SURGICAL/DEEP WOUND)

## 2021-04-19 NOTE — Progress Notes (Signed)
Subjective:  Mitchell Hughes is a 55 y.o. male who presents for Chief Complaint  Patient presents with   Hospitalization Follow-up    Hospital follow-up      Here for hospital follow-up.  Was admitted December 11 through April 17, 2021 for sepsis.  He presented to the hospital with lower quadrant right pain nausea vomiting fever.  He was on antibiotics.  He had kidney stent placed in this hospitalization.  He was sent home on antibiotics after receiving the IV antibiotics in the hospital.  Currently he is taking the Bactrim antibiotic, gabapentin at night and using the ibuprofen and pain medicine periodic when needed.  Overall he does feel like he is improving.  He is just trying to get his energy back.  In the last few days no fever, no nausea, no severe pain, is able to hydrate well.  He has questions about the CT scan and mass that was found on scan  He sees Dr. Tresa Moore, Urology next week   No other aggravating or relieving factors.    No other c/o.  Past Medical History:  Diagnosis Date   Erectile dysfunction    Family history of colon cancer    sister in 65s   Family history of heart disease 2011   baseline cardiac eval 2011 with Dr. Sallyanne Kuster   H/O echocardiogram 2015   Dr. Wynonia Lawman   History of exercise stress test    04/2014 Dr. Wynonia Lawman, prior 2011 normal [place] treadmill stress test, Dr. Debara Pickett   LOW BACK PAIN, ACUTE 09/26/2009   RENAL CALCULUS, RIGHT 09/28/2009   9.45mm on CT   TOBACCO USE, QUIT 09/26/2009   Wears glasses    Current Outpatient Medications on File Prior to Visit  Medication Sig Dispense Refill   gabapentin (NEURONTIN) 300 MG capsule Take 1 capsule (300 mg total) by mouth at bedtime. 30 capsule 2   oxyCODONE-acetaminophen (PERCOCET/ROXICET) 5-325 MG tablet Take 1 tablet by mouth every 6 (six) hours as needed for severe pain. 15 tablet 0   sildenafil (VIAGRA) 100 MG tablet Take 1 tablet (100 mg total) by mouth daily as needed for erectile dysfunction. 10 tablet  2   sulfamethoxazole-trimethoprim (BACTRIM DS) 800-160 MG tablet Take 1 tablet by mouth 2 (two) times daily for 7 days. 14 tablet 0   ibuprofen (ADVIL) 800 MG tablet Take 1 tablet (800 mg total) by mouth 3 (three) times daily. (Patient taking differently: Take 800 mg by mouth every 8 (eight) hours as needed for moderate pain.) 21 tablet 0   No current facility-administered medications on file prior to visit.    The following portions of the patient's history were reviewed and updated as appropriate: allergies, current medications, past family history, past medical history, past social history, past surgical history and problem list.  ROS Otherwise as in subjective above  Objective: BP 120/70    Pulse 72    Temp (!) 97.2 F (36.2 C)    Wt 223 lb (101.2 kg)    BMI 26.44 kg/m   General appearance: alert, no distress, somewhat fatigued and pale appearing Neck: supple, no lymphadenopathy, no thyromegaly, no masses Heart: RRR, normal S1, S2, no murmurs Lungs: CTA bilaterally, no wheezes, rhonchi, or rales Abdomen: +bs, soft, non tender, non distended, no masses, no hepatomegaly, no splenomegaly Right lateral flank with surgical bandages in place and urine drainage bag present. Pulses: 2+ radial pulses, 2+ pedal pulses, normal cap refill Ext: no edema  CT abdomen pelvis April 13, 2021 IMPRESSION: 1.  Large calculus in the lower pole of the right kidney causing obstruction of the lower pole calices. Mild perinephric fat stranding/fluid at the lower pole right kidney may be reactive and/or secondary to caliceal rupture. Also correlation for possible superimposed infection recommended. 2. Solid-appearing exophytic mass at the medial aspect of the left kidney which appears to demonstrate mild diffuse enhancement, concerning for malignancy. Consider follow-up with ultrasound and renal mass protocol MRI, and urology consultation is recommended. 3. Prostatomegaly. 4. Splenomegaly.     Assessment: Encounter Diagnoses  Name Primary?   Sepsis with acute organ dysfunction, due to unspecified organism, unspecified type, unspecified whether septic shock present ([place]) Yes   Kidney stone    Renal mass    Splenomegaly      Plan: I reviewed his discharge summary.  Medications were reconciled. I reviewed imaging and labs done at the hospital. He is feeling improved in general but still recuperating.  He will continue and finish out antibiotic Bactrim, continue gabapentin at night, and he is using ibuprofen and oxycodone as needed as needed  We reviewed his findings together.  We discussed the CT abdomen pelvis findings from April 13, 2021 as noted above.  We discussed question at the ask urology when he sees him next week regarding further evaluation or treatment for the left kidney mass.  I also discussed the findings of splenomegaly and prostatomegaly  Advise if any new symptoms in the short-term such as fever, worse pain, uncontrolled nausea vomiting then go back to the emergency department   Ansley was seen today for hospitalization follow-up.  Diagnoses and all orders for this visit:  Sepsis with acute organ dysfunction, due to unspecified organism, unspecified type, unspecified whether septic shock present [facility])  Kidney stone  Renal mass  Splenomegaly   Follow up: with urology next week

## 2021-04-19 NOTE — Patient Instructions (Signed)
Questions to ask urologist about the kidney mass seen on scan What is the role for PET scan, should this be done Is there a role for kidney biopsy or is this not safe   Ask Urologist about short term disability, time proposed back to work    Dietary Guidelines to Help Prevent Kidney Stones Kidney stones are deposits of minerals and salts that form inside your kidneys. Your risk of developing kidney stones may be greater depending on your diet, your lifestyle, the medicines you take, and whether you have certain medical conditions. Most people can lower their chances of developing kidney stones by following the instructions below. Your dietitian may give you more specific instructions depending on your overall health and the type of kidney stones you tend to develop. What are tips for following this plan? Reading food labels  Choose foods with "no salt added" or "low-salt" labels. Limit your salt (sodium) intake to less than 1,500 mg a day. Choose foods with calcium for each meal and snack. Try to eat about 300 mg of calcium at each meal. Foods that contain 200-500 mg of calcium a serving include: 8 oz (237 mL) of milk, calcium-fortifiednon-dairy milk, and calcium-fortifiedfruit juice. Calcium-fortified means that calcium has been added to these drinks. 8 oz (237 mL) of kefir, yogurt, and soy yogurt. 4 oz (114 g) of tofu. 1 oz (28 g) of cheese. 1 cup (150 g) of dried figs. 1 cup (91 g) of cooked broccoli. One 3 oz (85 g) can of sardines or mackerel. Most people need 1,000-1,500 mg of calcium a day. Talk to your dietitian about how much calcium is recommended for you. Shopping Buy plenty of fresh fruits and vegetables. Most people do not need to avoid fruits and vegetables, even if these foods contain nutrients that may contribute to kidney stones. When shopping for convenience foods, choose: Whole pieces of fruit. Pre-made salads with dressing on the side. Low-fat fruit and yogurt  smoothies. Avoid buying frozen meals or prepared deli foods. These can be high in sodium. Look for foods with live cultures, such as yogurt and kefir. Choose high-fiber grains, such as whole-wheat breads, oat bran, and wheat cereals. Cooking Do not add salt to food when cooking. Place a salt shaker on the table and allow each person to add his or her own salt to taste. Use vegetable protein, such as beans, textured vegetable protein (TVP), or tofu, instead of meat in pasta, casseroles, and soups. Meal planning Eat less salt, if told by your dietitian. To do this: Avoid eating processed or pre-made food. Avoid eating fast food. Eat less animal protein, including cheese, meat, poultry, or fish, if told by your dietitian. To do this: Limit the number of times you have meat, poultry, fish, or cheese each week. Eat a diet free of meat at least 2 days a week. Eat only one serving each day of meat, poultry, fish, or seafood. When you prepare animal protein, cut pieces into small portion sizes. For most meat and fish, one serving is about the size of the palm of your hand. Eat at least five servings of fresh fruits and vegetables each day. To do this: Keep fruits and vegetables on hand for snacks. Eat one piece of fruit or a handful of berries with breakfast. Have a salad and fruit at lunch. Have two kinds of vegetables at dinner. Limit foods that are high in a substance called oxalate. These include: Spinach (cooked), rhubarb, beets, sweet potatoes, and Swiss chard.  Peanuts. Potato chips, french fries, and baked potatoes with skin on. Nuts and nut products. Chocolate. If you regularly take a diuretic medicine, make sure to eat at least 1 or 2 servings of fruits or vegetables that are high in potassium each day. These include: Avocado. Banana. Orange, prune, carrot, or tomato juice. Baked potato. Cabbage. Beans and split peas. Lifestyle  Drink enough fluid to keep your urine pale yellow.  This is the most important thing you can do. Spread your fluid intake throughout the day. If you drink alcohol: Limit how much you use to: 0-1 drink a day for women who are not pregnant. 0-2 drinks a day for men. Be aware of how much alcohol is in your drink. In the [place] one drink equals one 12 oz bottle of beer (355 mL), one 5 oz glass of wine (148 mL), or one 1 oz glass of hard liquor (44 mL). Lose weight if told by your health care provider. Work with your dietitian to find an eating plan and weight loss strategies that work best for you. General information Talk to your health care provider and dietitian about taking daily supplements. You may be told the following depending on your health and the cause of your kidney stones: Not to take supplements with vitamin C. To take a calcium supplement. To take a daily probiotic supplement. To take other supplements such as magnesium, fish oil, or vitamin B6. Take over-the-counter and prescription medicines only as told by your health care provider. These include supplements. What foods should I limit? Limit your intake of the following foods, or eat them as told by your dietitian. Vegetables Spinach. Rhubarb. Beets. Canned vegetables. Angie Fava. Olives. Baked potatoes with skin. Grains Wheat bran. Baked goods. Salted crackers. Cereals high in sugar. Meats and other proteins Nuts. Nut butters. Large portions of meat, poultry, or fish. Salted, precooked, or cured meats, such as sausages, meat loaves, and hot dogs. Dairy Cheese. Beverages Regular soft drinks. Regular vegetable juice. Seasonings and condiments Seasoning blends with salt. Salad dressings. Soy sauce. Ketchup. Barbecue sauce. Other foods Canned soups. Canned pasta sauce. Casseroles. Pizza. Lasagna. Frozen meals. Potato chips. [place] fries. The items listed above may not be a complete list of foods and beverages you should limit. Contact a dietitian for more information. What  foods should I avoid? Talk to your dietitian about specific foods you should avoid based on the type of kidney stones you have and your overall health. Fruits Grapefruit. The item listed above may not be a complete list of foods and beverages you should avoid. Contact a dietitian for more information. Summary Kidney stones are deposits of minerals and salts that form inside your kidneys. You can lower your risk of kidney stones by making changes to your diet. The most important thing you can do is drink enough fluid. Drink enough fluid to keep your urine pale yellow. Talk to your dietitian about how much calcium you should have each day, and eat less salt and animal protein as told by your dietitian. This information is not intended to replace advice given to you by your health care provider. Make sure you discuss any questions you have with your health care provider. Document Revised: 04/14/2019 Document Reviewed: 04/14/2019 Elsevier Patient Education  2022 [employer].

## 2021-04-23 ENCOUNTER — Other Ambulatory Visit: Payer: Self-pay | Admitting: Urology

## 2021-04-24 ENCOUNTER — Ambulatory Visit: Payer: BC Managed Care – PPO | Admitting: Diagnostic Neuroimaging

## 2021-04-25 DIAGNOSIS — N200 Calculus of kidney: Secondary | ICD-10-CM | POA: Diagnosis not present

## 2021-04-25 DIAGNOSIS — C642 Malignant neoplasm of left kidney, except renal pelvis: Secondary | ICD-10-CM | POA: Diagnosis not present

## 2021-04-30 DIAGNOSIS — N200 Calculus of kidney: Secondary | ICD-10-CM | POA: Diagnosis not present

## 2021-05-09 ENCOUNTER — Institutional Professional Consult (permissible substitution): Payer: BC Managed Care – PPO | Admitting: Neurology

## 2021-05-13 ENCOUNTER — Telehealth: Payer: Self-pay | Admitting: *Deleted

## 2021-05-13 NOTE — Telephone Encounter (Signed)
Transition Care Management Unsuccessful Follow-up Telephone Call  Date of discharge and from where:  04/17/21 [facility]  Attempts:  1st Attempt  Reason for unsuccessful TCM follow-up call:  Left voice message   Kelli Churn RN, CCM, [place] Management Coordinator - Managed [place] [facility] [phone]

## 2021-05-14 ENCOUNTER — Telehealth: Payer: Self-pay | Admitting: *Deleted

## 2021-05-14 NOTE — Patient Instructions (Addendum)
DUE TO COVID-19 ONLY ONE VISITOR IS ALLOWED TO COME WITH YOU AND STAY IN THE WAITING ROOM ONLY DURING PRE OP AND PROCEDURE.   **NO VISITORS ARE ALLOWED IN THE SHORT STAY AREA OR RECOVERY ROOM!!**  IF YOU WILL BE ADMITTED INTO THE HOSPITAL YOU ARE ALLOWED ONLY TWO SUPPORT PEOPLE DURING VISITATION HOURS ONLY (7 AM -8PM)   The support person(s) must pass our screening, gel in and out, and wear a mask at all times, including in the patients room. Patients must also wear a mask when staff or their support person are in the room. Visitors GUEST BADGE MUST BE WORN VISIBLY  One adult visitor may remain with you overnight and MUST be in the room by 8 P.M.  No visitors under the age of 42. Any visitor under the age of 22 must be accompanied by an adult.    COVID SWAB TESTING MUST BE COMPLETED ON:  05/15/21 @ 12:45 PM  You are not required to quarantine, however you are required to wear a well-fitted mask when you are out and around people not in your household.  Hand Hygiene often Do NOT share personal items Notify your provider if you are in close contact with someone who has COVID or you develop fever 100.4 or greater, new onset of sneezing, cough, sore throat, shortness of breath or body aches.       Your procedure is scheduled on: 05/17/21   Report to [facility] Main Entrance    Report to admitting at 11:45 AM   Call this number if you have problems the morning of surgery [phone]   Do not eat food :After Midnight.   May have liquids until 11:00 AM day of surgery  CLEAR LIQUID DIET  Foods Allowed                                                                     Foods Excluded  Water, Black Coffee and tea (no milk or creamer)           liquids that you cannot  Plain Jell-O in any flavor  (No red)                                    see through such as: Fruit ices (not with fruit pulp)                                            milk, soups, orange juice              Iced  Popsicles (No red)                                               All solid food                                   Apple  juices Sports drinks like Gatorade (No red) Lightly seasoned clear broth or consume(fat free) Sugar   Oral Hygiene is also important to reduce your risk of infection.                                    Remember - BRUSH YOUR TEETH THE MORNING OF SURGERY WITH YOUR REGULAR TOOTHPASTE   Take these medicines the morning of surgery with A SIP OF WATER: None             You may not have any metal on your body including jewelry, and body piercing             Do not wear lotions, powders, cologne, or deodorant               Men may shave face and neck.   Do not bring valuables to the hospital. [place].   Bring small overnight bag day of surgery.   Special Instructions: Bring a copy of your healthcare power of attorney and living will documents         the day of surgery if you haven't scanned them before.              Please read over the following fact sheets you were given: IF YOU HAVE QUESTIONS ABOUT YOUR PRE-OP INSTRUCTIONS PLEASE CALL [place] - Preparing for Surgery Before surgery, you can play an important role.  Because skin is not sterile, your skin needs to be as free of germs as possible.  You can reduce the number of germs on your skin by washing with CHG (chlorahexidine gluconate) soap before surgery.  CHG is an antiseptic cleaner which kills germs and bonds with the skin to continue killing germs even after washing. Please DO NOT use if you have an allergy to CHG or antibacterial soaps.  If your skin becomes reddened/irritated stop using the CHG and inform your nurse when you arrive at Short Stay. Do not shave (including legs and underarms) for at least 48 hours prior to the first CHG shower.  You may shave your face/neck.  Please follow these instructions carefully:  1.  Shower with  CHG Soap the night before surgery and the  morning of surgery.  2.  If you choose to wash your hair, wash your hair first as usual with your normal  shampoo.  3.  After you shampoo, rinse your hair and body thoroughly to remove the shampoo.                             4.  Use CHG as you would any other liquid soap.  You can apply chg directly to the skin and wash.  Gently with a scrungie or clean washcloth.  5.  Apply the CHG Soap to your body ONLY FROM THE NECK DOWN.   Do   not use on face/ open                           Wound or open sores. Avoid contact with eyes, ears mouth and   genitals (private parts).  Wash face,  Genitals (private parts) with your normal soap.             6.  Wash thoroughly, paying special attention to the area where your    surgery  will be performed.  7.  Thoroughly rinse your body with warm water from the neck down.  8.  DO NOT shower/wash with your normal soap after using and rinsing off the CHG Soap.                9.  Pat yourself dry with a clean towel.            10.  Wear clean pajamas.            11.  Place clean sheets on your bed the night of your first shower and do not  sleep with pets. Day of Surgery : Do not apply any lotions/deodorants the morning of surgery.  Please wear clean clothes to the hospital/surgery center.  FAILURE TO FOLLOW THESE INSTRUCTIONS MAY RESULT IN THE CANCELLATION OF YOUR SURGERY  PATIENT SIGNATURE_________________________________  NURSE SIGNATURE__________________________________  ________________________________________________________________________

## 2021-05-14 NOTE — Telephone Encounter (Signed)
Transition Care Management Follow-up Telephone Call Date of discharge and from where: 04/17/21 [facility] How have you been since you were released from the hospital? "Fine" Any questions or concerns? Per patient request preop testing appointment date and time of 05/15/21 at 11:00 am at [facility] verified  Items Reviewed: Did the pt receive and understand the discharge instructions provided? Yes  Medications obtained and verified? Yes  Other? No  Any new allergies since your discharge? No  Dietary orders reviewed? Yes Do you have support at home? Yes   Home Care and Equipment/Supplies: Were home health services ordered? not applicable If so, what is the name of the agency? Not applicable  Has the agency set up a time to come to the patient's home? not applicable Were any new equipment or medical supplies ordered?  No What is the name of the medical supply agency? Not applicable Were you able to get the supplies/equipment? not applicable Do you have any questions related to the use of the equipment or supplies? Not applicable  Functional Questionnaire: (I = Independent and D = Dependent) ADLs: I  Bathing/Dressing- I  Meal Prep- I  Eating- I  Maintaining continence- I  Transferring/Ambulation- I  Managing Meds- I  Follow up appointments reviewed:  PCP Hospital f/u appt confirmed? Yes  Scheduled to see Chana Bode PA on 04/19/21 @ 11:45 AM- completed Specialist Hospital f/u appt confirmed? Yes  Scheduled to see Dr Tresa Moore  on 04/25/21- completed Are transportation arrangements needed? No  If their condition worsens, is the pt aware to call PCP or go to the Emergency Dept.? Yes Was the patient provided with contact information for the PCP's office or ED? Yes Was to pt encouraged to call back with questions or concerns? Yes

## 2021-05-14 NOTE — Progress Notes (Addendum)
COVID swab appointment: 05/15/21 @ 1245  COVID Vaccine Completed: yes x2 Date COVID Vaccine completed: 07/17/19, 08/08/19 Has received booster: COVID vaccine manufacturer: Pfizer      Date of COVID positive in last 90 days: no  PCP - Chana Bode, PA Cardiologist - n/a  Chest x-ray - 04/14/21 Epic EKG - 04/15/21 Epic Stress Test - 2011 ECHO - 02/07/19 Epic Cardiac Cath - n/a Pacemaker/ICD device last checked: n/a Spinal Cord Stimulator:n/a  Sleep Study - no, scheduled fr next month CPAP -   Fasting Blood Sugar - n/a Checks Blood Sugar _____ times a day  Blood Thinner Instructions: n/a Aspirin Instructions: Last Dose:  Activity level: Can go up a flight of stairs and perform activities of daily living without stopping and without symptoms of chest pain or shortness of breath.    Anesthesia review: hospitalized for sepsis 04/14/21  Patient denies shortness of breath, fever, cough and chest pain at PAT appointment   Patient verbalized understanding of instructions that were given to them at the PAT appointment. Patient was also instructed that they will need to review over the PAT instructions again at home before surgery.

## 2021-05-15 ENCOUNTER — Encounter (HOSPITAL_COMMUNITY): Payer: Self-pay

## 2021-05-15 ENCOUNTER — Encounter (HOSPITAL_COMMUNITY)
Admission: RE | Admit: 2021-05-15 | Discharge: 2021-05-15 | Disposition: A | Discharge status: Home / Self Care | Payer: BC Managed Care – PPO | Source: Ambulatory Visit | Attending: Urology | Admitting: Urology

## 2021-05-15 ENCOUNTER — Other Ambulatory Visit: Payer: Self-pay

## 2021-05-15 DIAGNOSIS — Z01812 Encounter for preprocedural laboratory examination: Secondary | ICD-10-CM | POA: Insufficient documentation

## 2021-05-15 DIAGNOSIS — Z20822 Contact with and (suspected) exposure to covid-19: Secondary | ICD-10-CM | POA: Diagnosis not present

## 2021-05-15 DIAGNOSIS — Z01818 Encounter for other preprocedural examination: Secondary | ICD-10-CM

## 2021-05-15 LAB — SARS CORONAVIRUS 2 (TAT 6-24 HRS): SARS Coronavirus 2: NEGATIVE

## 2021-05-16 NOTE — Anesthesia Preprocedure Evaluation (Addendum)
Anesthesia Evaluation  Patient identified by MRN, date of birth, ID band Patient awake    Reviewed: Allergy & Precautions, NPO status , Patient's Chart, lab work & pertinent test results  Airway Mallampati: II  TM Distance: >3 FB Neck ROM: Full    Dental no notable dental hx. (+) Teeth Intact, Dental Advisory Given   Pulmonary neg pulmonary ROS, former smoker,    Pulmonary exam normal breath sounds clear to auscultation       Cardiovascular Normal cardiovascular exam Rhythm:Regular Rate:Normal     Neuro/Psych negative neurological ROS  negative psych ROS   GI/Hepatic   Endo/Other  negative endocrine ROS  Renal/GU Renal disease (nephrolithiasis)Lab Results      Component                Value               Date                      CREATININE               1.13                04/17/2021                NA                       136                 04/17/2021                K                        3.6                 04/17/2021                         Musculoskeletal  (+) Arthritis ,   Abdominal   Peds  Hematology Lab Results      Component                Value               Date                            HGB                      11.9 (L)            04/17/2021                HCT                      36.5 (L)            04/17/2021                PLT                      132 (L)             04/17/2021              Anesthesia Other Findings NKA  Reproductive/Obstetrics  Anesthesia Physical Anesthesia Plan  ASA: 2  Anesthesia Plan: General   Post-op Pain Management: Dilaudid IV and Tylenol PO (pre-op)   Induction: Intravenous  PONV Risk Score and Plan: 3 and Treatment may vary due to age or medical condition, Midazolam, Ondansetron and Dexamethasone  Airway Management Planned: Oral ETT  Additional Equipment: None  Intra-op Plan:   Post-operative Plan:  Extubation in OR  Informed Consent: I have reviewed the patients History and Physical, chart, labs and discussed the procedure including the risks, benefits and alternatives for the proposed anesthesia with the patient or authorized representative who has indicated his/her understanding and acceptance.     Dental advisory given  Plan Discussed with: CRNA and Anesthesiologist  Anesthesia Plan Comments:        Anesthesia Quick Evaluation

## 2021-05-17 ENCOUNTER — Ambulatory Visit (HOSPITAL_COMMUNITY): Payer: BC Managed Care – PPO

## 2021-05-17 ENCOUNTER — Ambulatory Visit (HOSPITAL_COMMUNITY): Payer: BC Managed Care – PPO | Admitting: Anesthesiology

## 2021-05-17 ENCOUNTER — Ambulatory Visit (HOSPITAL_COMMUNITY): Payer: BC Managed Care – PPO | Admitting: Physician Assistant

## 2021-05-17 ENCOUNTER — Encounter (HOSPITAL_COMMUNITY): Payer: Self-pay | Admitting: Urology

## 2021-05-17 ENCOUNTER — Other Ambulatory Visit: Payer: Self-pay

## 2021-05-17 ENCOUNTER — Observation Stay (HOSPITAL_COMMUNITY)
Admission: RE | Admit: 2021-05-17 | Discharge: 2021-05-18 | Disposition: A | Discharge status: Home / Self Care | Payer: BC Managed Care – PPO | Source: Ambulatory Visit | Attending: Urology | Admitting: Urology

## 2021-05-17 ENCOUNTER — Encounter (HOSPITAL_COMMUNITY)
Admission: RE | Disposition: A | Discharge status: Home / Self Care | Payer: Self-pay | Source: Ambulatory Visit | Attending: Urology

## 2021-05-17 DIAGNOSIS — N2889 Other specified disorders of kidney and ureter: Secondary | ICD-10-CM | POA: Diagnosis not present

## 2021-05-17 DIAGNOSIS — D49512 Neoplasm of unspecified behavior of left kidney: Secondary | ICD-10-CM | POA: Diagnosis not present

## 2021-05-17 DIAGNOSIS — Z87891 Personal history of nicotine dependence: Secondary | ICD-10-CM | POA: Diagnosis not present

## 2021-05-17 DIAGNOSIS — N200 Calculus of kidney: Principal | ICD-10-CM | POA: Insufficient documentation

## 2021-05-17 HISTORY — PX: NEPHROLITHOTOMY: SHX5134

## 2021-05-17 LAB — CBC WITH DIFFERENTIAL/PLATELET
Abs Immature Granulocytes: 0.01 10*3/uL (ref 0.00–0.07)
Basophils Absolute: 0 10*3/uL (ref 0.0–0.1)
Basophils Relative: 1 %
Eosinophils Absolute: 0.2 10*3/uL (ref 0.0–0.5)
Eosinophils Relative: 3 %
HCT: 43.2 % (ref 39.0–52.0)
Hemoglobin: 14.9 g/dL (ref 13.0–17.0)
Immature Granulocytes: 0 %
Lymphocytes Relative: 22 %
Lymphs Abs: 1.4 10*3/uL (ref 0.7–4.0)
MCH: 29.4 pg (ref 26.0–34.0)
MCHC: 34.5 g/dL (ref 30.0–36.0)
MCV: 85.2 fL (ref 80.0–100.0)
Monocytes Absolute: 0.4 10*3/uL (ref 0.1–1.0)
Monocytes Relative: 7 %
Neutro Abs: 4.2 10*3/uL (ref 1.7–7.7)
Neutrophils Relative %: 67 %
Platelets: 237 10*3/uL (ref 150–400)
RBC: 5.07 MIL/uL (ref 4.22–5.81)
RDW: 12.6 % (ref 11.5–15.5)
WBC: 6.2 10*3/uL (ref 4.0–10.5)
nRBC: 0 % (ref 0.0–0.2)

## 2021-05-17 LAB — BASIC METABOLIC PANEL
Anion gap: 4 — ABNORMAL LOW (ref 5–15)
BUN: 17 mg/dL (ref 6–20)
CO2: 22 mmol/L (ref 22–32)
Calcium: 9.1 mg/dL (ref 8.9–10.3)
Chloride: 108 mmol/L (ref 98–111)
Creatinine, Ser: 1.05 mg/dL (ref 0.61–1.24)
GFR, Estimated: >60 mL/min (ref >60)
Glucose, Bld: 107 mg/dL — ABNORMAL HIGH (ref 70–99)
Potassium: 4 mmol/L (ref 3.5–5.1)
Sodium: 134 mmol/L — ABNORMAL LOW (ref 135–145)

## 2021-05-17 LAB — HEMOGLOBIN AND HEMATOCRIT, BLOOD
HCT: 41.9 % (ref 39.0–52.0)
Hemoglobin: 13.9 g/dL (ref 13.0–17.0)

## 2021-05-17 SURGERY — NEPHROLITHOTOMY PERCUTANEOUS
Anesthesia: General | Site: Ureter | Laterality: Right

## 2021-05-17 MED ORDER — SUGAMMADEX SODIUM 200 MG/2ML IV SOLN
INTRAVENOUS | Status: DC | PRN
  Administered 2021-05-17: 300 mg via INTRAVENOUS

## 2021-05-17 MED ORDER — CHLORHEXIDINE GLUCONATE 0.12 % MT SOLN
15.0000 mL | Freq: Once | OROMUCOSAL | Status: AC
  Administered 2021-05-17: 15 mL via OROMUCOSAL

## 2021-05-17 MED ORDER — HYDROMORPHONE HCL 1 MG/ML IJ SOLN
0.2500 mg | INTRAMUSCULAR | Status: DC | PRN
  Administered 2021-05-17: 0.5 mg via INTRAVENOUS

## 2021-05-17 MED ORDER — IOHEXOL 300 MG/ML  SOLN
INTRAMUSCULAR | Status: DC | PRN
  Administered 2021-05-17: 9 mL

## 2021-05-17 MED ORDER — ONDANSETRON HCL 4 MG/2ML IJ SOLN
INTRAMUSCULAR | Status: DC | PRN
  Administered 2021-05-17: 4 mg via INTRAVENOUS

## 2021-05-17 MED ORDER — ACETAMINOPHEN 10 MG/ML IV SOLN: 1000.0000 mg | Freq: Once | INTRAVENOUS | Status: DC | PRN

## 2021-05-17 MED ORDER — ROCURONIUM BROMIDE 10 MG/ML (PF) SYRINGE
PREFILLED_SYRINGE | INTRAVENOUS | Status: DC | PRN
  Administered 2021-05-17: 100 mg via INTRAVENOUS

## 2021-05-17 MED ORDER — ONDANSETRON HCL 4 MG/2ML IJ SOLN
INTRAMUSCULAR | Status: AC
  Filled 2021-05-17: qty 2

## 2021-05-17 MED ORDER — OXYCODONE HCL 5 MG PO TABS
5.0000 mg | ORAL_TABLET | Freq: Once | ORAL | Status: AC | PRN
Start: 2021-05-17 — End: 2021-05-17
  Administered 2021-05-17: 5 mg via ORAL

## 2021-05-17 MED ORDER — OXYCODONE HCL 5 MG PO TABS
5.0000 mg | ORAL_TABLET | ORAL | Status: DC | PRN
  Administered 2021-05-18: 5 mg via ORAL
  Filled 2021-05-17: qty 1

## 2021-05-17 MED ORDER — ROCURONIUM BROMIDE 10 MG/ML (PF) SYRINGE
PREFILLED_SYRINGE | INTRAVENOUS | Status: AC
  Filled 2021-05-17: qty 10

## 2021-05-17 MED ORDER — OXYCODONE HCL 5 MG PO TABS
ORAL_TABLET | ORAL | Status: AC
  Filled 2021-05-17: qty 1

## 2021-05-17 MED ORDER — BELLADONNA ALKALOIDS-OPIUM 16.2-60 MG RE SUPP
RECTAL | Status: AC
  Filled 2021-05-17: qty 1

## 2021-05-17 MED ORDER — DEXAMETHASONE SODIUM PHOSPHATE 10 MG/ML IJ SOLN
INTRAMUSCULAR | Status: DC | PRN
  Administered 2021-05-17: 10 mg via INTRAVENOUS

## 2021-05-17 MED ORDER — SODIUM CHLORIDE 0.9 % IV SOLN: INTRAVENOUS | Status: DC

## 2021-05-17 MED ORDER — BELLADONNA ALKALOIDS-OPIUM 16.2-30 MG RE SUPP
1.0000 | Freq: Once | RECTAL | Status: AC
  Administered 2021-05-17: 1 via RECTAL

## 2021-05-17 MED ORDER — LIDOCAINE HCL (PF) 2 % IJ SOLN
INTRAMUSCULAR | Status: AC
  Filled 2021-05-17: qty 5

## 2021-05-17 MED ORDER — MIDAZOLAM HCL 2 MG/2ML IJ SOLN
INTRAMUSCULAR | Status: AC
  Filled 2021-05-17: qty 2

## 2021-05-17 MED ORDER — MIDAZOLAM HCL 2 MG/2ML IJ SOLN
INTRAMUSCULAR | Status: DC | PRN
  Administered 2021-05-17: 2 mg via INTRAVENOUS

## 2021-05-17 MED ORDER — SODIUM CHLORIDE 0.9 % IR SOLN
Status: DC | PRN
  Administered 2021-05-17: 6000 mL
  Administered 2021-05-17 (×3): 3000 mL

## 2021-05-17 MED ORDER — SURGIFLO WITH THROMBIN (HEMOSTATIC MATRIX KIT) OPTIME
TOPICAL | Status: DC | PRN
  Administered 2021-05-17: 1 via TOPICAL

## 2021-05-17 MED ORDER — OXYCODONE HCL 5 MG/5ML PO SOLN: 5.0000 mg | Freq: Once | ORAL | Status: AC | PRN

## 2021-05-17 MED ORDER — AMISULPRIDE (ANTIEMETIC) 5 MG/2ML IV SOLN: 10.0000 mg | Freq: Once | INTRAVENOUS | Status: DC | PRN

## 2021-05-17 MED ORDER — ORAL CARE MOUTH RINSE: 15.0000 mL | Freq: Once | OROMUCOSAL | Status: AC

## 2021-05-17 MED ORDER — ONDANSETRON HCL 4 MG/2ML IJ SOLN: 4.0000 mg | Freq: Once | INTRAMUSCULAR | Status: DC | PRN

## 2021-05-17 MED ORDER — FENTANYL CITRATE (PF) 250 MCG/5ML IJ SOLN
INTRAMUSCULAR | Status: AC
  Filled 2021-05-17: qty 5

## 2021-05-17 MED ORDER — BELLADONNA ALKALOIDS-OPIUM 16.2-30 MG RE SUPP: 1.0000 | Freq: Three times a day (TID) | RECTAL | Status: DC | PRN

## 2021-05-17 MED ORDER — PROPOFOL 10 MG/ML IV BOLUS
INTRAVENOUS | Status: DC | PRN
  Administered 2021-05-17: 190 mg via INTRAVENOUS

## 2021-05-17 MED ORDER — GABAPENTIN 300 MG PO CAPS
300.0000 mg | ORAL_CAPSULE | Freq: Every day | ORAL | Status: DC
  Administered 2021-05-17: 300 mg via ORAL
  Filled 2021-05-17: qty 1

## 2021-05-17 MED ORDER — LACTATED RINGERS IV SOLN: INTRAVENOUS | Status: DC

## 2021-05-17 MED ORDER — SENNOSIDES-DOCUSATE SODIUM 8.6-50 MG PO TABS
1.0000 | ORAL_TABLET | Freq: Two times a day (BID) | ORAL | Status: DC
  Administered 2021-05-17 – 2021-05-18 (×2): 1 via ORAL
  Filled 2021-05-17 (×2): qty 1

## 2021-05-17 MED ORDER — LIDOCAINE 2% (20 MG/ML) 5 ML SYRINGE
INTRAMUSCULAR | Status: DC | PRN
  Administered 2021-05-17: 100 mg via INTRAVENOUS

## 2021-05-17 MED ORDER — GENTAMICIN SULFATE 40 MG/ML IJ SOLN
5.0000 mg/kg | INTRAVENOUS | Status: AC
  Administered 2021-05-17: 470 mg via INTRAVENOUS
  Filled 2021-05-17: qty 11.75

## 2021-05-17 MED ORDER — BELLADONNA ALKALOIDS-OPIUM 16.2-60 MG RE SUPP: 1.0000 | Freq: Once | RECTAL | Status: AC

## 2021-05-17 MED ORDER — HYDROMORPHONE HCL 1 MG/ML IJ SOLN
0.5000 mg | INTRAMUSCULAR | Status: DC | PRN
  Administered 2021-05-17: 1 mg via INTRAVENOUS
  Filled 2021-05-17: qty 1

## 2021-05-17 MED ORDER — FENTANYL CITRATE (PF) 100 MCG/2ML IJ SOLN
INTRAMUSCULAR | Status: DC | PRN
Start: 2021-05-17 — End: 2021-05-17
  Administered 2021-05-17: 100 ug via INTRAVENOUS
  Administered 2021-05-17 (×3): 50 ug via INTRAVENOUS

## 2021-05-17 MED ORDER — HYDROMORPHONE HCL 1 MG/ML IJ SOLN
INTRAMUSCULAR | Status: AC
  Administered 2021-05-17: 0.5 mg via INTRAVENOUS
  Filled 2021-05-17: qty 1

## 2021-05-17 MED ORDER — DEXAMETHASONE SODIUM PHOSPHATE 10 MG/ML IJ SOLN
INTRAMUSCULAR | Status: AC
  Filled 2021-05-17: qty 1

## 2021-05-17 MED ORDER — ACETAMINOPHEN 500 MG PO TABS
1000.0000 mg | ORAL_TABLET | Freq: Three times a day (TID) | ORAL | Status: DC
  Administered 2021-05-17 – 2021-05-18 (×3): 1000 mg via ORAL
  Filled 2021-05-17 (×3): qty 2

## 2021-05-17 SURGICAL SUPPLY — 72 items
APPLICATOR SURGIFLO ENDO (HEMOSTASIS) ×2 IMPLANT
BAG COUNTER SPONGE SURGICOUNT (BAG) IMPLANT
BAG URINE DRAIN 2000ML AR STRL (UROLOGICAL SUPPLIES) IMPLANT
BAG URO CATCHER STRL LF (MISCELLANEOUS) IMPLANT
BASKET LASER NITINOL 1.9FR (BASKET) ×2 IMPLANT
BASKET ZERO TIP NITINOL 2.4FR (BASKET) IMPLANT
BENZOIN TINCTURE PRP APPL 2/3 (GAUZE/BANDAGES/DRESSINGS) ×3 IMPLANT
BLADE SURG 15 STRL LF DISP TIS (BLADE) ×2 IMPLANT
BLADE SURG 15 STRL SS (BLADE) ×1
CATH FOLEY 2W COUNCIL 20FR 5CC (CATHETERS) IMPLANT
CATH FOLEY 2WAY SLVR  5CC 16FR (CATHETERS) ×1
CATH FOLEY 2WAY SLVR 5CC 16FR (CATHETERS) ×2 IMPLANT
CATH MULTI PURPOSE 16FR DRAIN (CATHETERS) IMPLANT
CATH ROBINSON RED A/P 20FR (CATHETERS) IMPLANT
CATH ULTRATHANE 14FR (CATHETERS) ×2 IMPLANT
CATH URETL OPEN END 6FR 70 (CATHETERS) IMPLANT
CATH UROLOGY TORQUE 40 (MISCELLANEOUS) ×2 IMPLANT
CATH X-FORCE N30 NEPHROSTOMY (TUBING) ×2 IMPLANT
CHLORAPREP W/TINT 26 (MISCELLANEOUS) ×4 IMPLANT
DRAPE C-ARM 42X120 X-RAY (DRAPES) ×3 IMPLANT
DRAPE LINGEMAN PERC (DRAPES) ×3 IMPLANT
DRAPE SHEET LG 3/4 BI-LAMINATE (DRAPES) IMPLANT
DRAPE SURG IRRIG POUCH 19X23 (DRAPES) ×3 IMPLANT
DRSG PAD ABDOMINAL 8X10 ST (GAUZE/BANDAGES/DRESSINGS) ×6 IMPLANT
DRSG TEGADERM 4X4.75 (GAUZE/BANDAGES/DRESSINGS) IMPLANT
DRSG TEGADERM 8X12 (GAUZE/BANDAGES/DRESSINGS) ×6 IMPLANT
GAUZE SPONGE 4X4 12PLY STRL (GAUZE/BANDAGES/DRESSINGS) ×3 IMPLANT
GLOVE SURG ENC TEXT LTX SZ7.5 (GLOVE) ×3 IMPLANT
GOWN STRL REUS W/TWL LRG LVL3 (GOWN DISPOSABLE) ×3 IMPLANT
GUIDEWIRE AMPLAZ .035X145 (WIRE) ×6 IMPLANT
GUIDEWIRE ANG ZIPWIRE 035X150 (WIRE) ×2 IMPLANT
GUIDEWIRE ANG ZIPWIRE 038X150 (WIRE) ×3 IMPLANT
GUIDEWIRE STR DUAL SENSOR (WIRE) ×2 IMPLANT
IV SET EXTENSION CATH 6 NF (IV SETS) IMPLANT
KIT BASIN OR (CUSTOM PROCEDURE TRAY) ×3 IMPLANT
KIT PROBE 340X3.4XDISP GRN (MISCELLANEOUS) ×1 IMPLANT
KIT PROBE TRILOGY 3.4X340 (MISCELLANEOUS) ×1
KIT PROBE TRILOGY 3.9X350 (MISCELLANEOUS) IMPLANT
KIT TURNOVER KIT A (KITS) IMPLANT
LASER FIB FLEXIVA PULSE ID 365 (Laser) IMPLANT
LASER FIB FLEXIVA PULSE ID 550 (Laser) IMPLANT
LASER FIB FLEXIVA PULSE ID 910 (Laser) IMPLANT
MANIFOLD NEPTUNE II (INSTRUMENTS) ×3 IMPLANT
NDL TROCAR 18X15 ECHO (NEEDLE) IMPLANT
NDL TROCAR 18X20 (NEEDLE) IMPLANT
NEEDLE TROCAR 18X15 ECHO (NEEDLE) IMPLANT
NEEDLE TROCAR 18X20 (NEEDLE) IMPLANT
NS IRRIG 1000ML POUR BTL (IV SOLUTION) ×3 IMPLANT
PACK CYSTO (CUSTOM PROCEDURE TRAY) ×2 IMPLANT
SHEATH PEELAWAY SET 9 (SHEATH) ×2 IMPLANT
SPONGE T-LAP 4X18 ~~LOC~~+RFID (SPONGE) ×3 IMPLANT
STENT URET 6FRX26 CONTOUR (STENTS) ×2 IMPLANT
SURGIFLO W/THROMBIN 8M KIT (HEMOSTASIS) ×2 IMPLANT
SUT SILK 2 0 30  PSL (SUTURE) ×1
SUT SILK 2 0 30 PSL (SUTURE) ×2 IMPLANT
SUT VIC AB 2-0 CT1 27 (SUTURE)
SUT VIC AB 2-0 CT1 TAPERPNT 27 (SUTURE) IMPLANT
SUT VIC AB 2-0 SH 27 (SUTURE) ×1
SUT VIC AB 2-0 SH 27XBRD (SUTURE) ×1 IMPLANT
SYR 10ML LL (SYRINGE) ×3 IMPLANT
SYR 20ML LL LF (SYRINGE) ×6 IMPLANT
SYR 50ML LL SCALE MARK (SYRINGE) ×3 IMPLANT
TOWEL OR 17X26 10 PK STRL BLUE (TOWEL DISPOSABLE) ×3 IMPLANT
TRACTIP FLEXIVA PULS ID 200XHI (Laser) IMPLANT
TRACTIP FLEXIVA PULSE ID 200 (Laser)
TRAY FOLEY MTR SLVR 16FR STAT (SET/KITS/TRAYS/PACK) ×3 IMPLANT
TUBE CONNECTING VINYL 14FR 30C (TUBING) IMPLANT
TUBING CONNECTING 10 (TUBING) ×6 IMPLANT
TUBING STONE CATCHER TRILOGY (MISCELLANEOUS) ×2 IMPLANT
TUBING UROLOGY SET (TUBING) ×2 IMPLANT
WATER STERILE IRR 1000ML POUR (IV SOLUTION) ×3 IMPLANT
WATER STERILE IRR 3000ML UROMA (IV SOLUTION) ×3 IMPLANT

## 2021-05-17 NOTE — Brief Op Note (Signed)
05/17/2021  2:46 PM  PATIENT:  Mitchell Hughes  56 y.o. male  PRE-OPERATIVE DIAGNOSIS:  LARGE RIGHT RENAL STONES  POST-OPERATIVE DIAGNOSIS:  large right renal stones  PROCEDURE:  Procedure(s): NEPHROLITHOTOMY PERCUTANEOUS insertion double j stent (Right)  SURGEON:  Surgeon(s) and Role:    Alexis Frock, MD - Primary  PHYSICIAN ASSISTANT:   ASSISTANTS: none   ANESTHESIA:   general  EBL:  100 mL   BLOOD ADMINISTERED:none  DRAINS:  foley to gravity    LOCAL MEDICATIONS USED:  NONE  SPECIMEN:  Source of Specimen:  Rt Renal Stone Fragments  DISPOSITION OF SPECIMEN:   [facility] for compositional analysis  COUNTS:  YES  TOURNIQUET:  * No tourniquets in log *  DICTATION: .Other Dictation: Dictation Number [id number]  PLAN OF CARE: Admit for overnight observation  PATIENT DISPOSITION:  PACU - hemodynamically stable.   Delay start of Pharmacological VTE agent (>24hrs) due to surgical blood loss or risk of bleeding: yes

## 2021-05-17 NOTE — Transfer of Care (Signed)
Immediate Anesthesia Transfer of Care Note  Patient: Mitchell Hughes  Procedure(s) Performed: NEPHROLITHOTOMY PERCUTANEOUS insertion double j stent (Right: Ureter)  Patient Location: PACU  Anesthesia Type:General  Level of Consciousness: awake, alert  and oriented  Airway & Oxygen Therapy: Patient Spontanous Breathing and Patient connected to face mask oxygen  Post-op Assessment: Report given to RN and Post -op Vital signs reviewed and stable  Post vital signs: Reviewed and stable  Last Vitals:  Vitals Value Taken Time  BP 124/87 05/17/21 1500  Temp    Pulse 61 05/17/21 1501  Resp 15 05/17/21 1501  SpO2 100 % 05/17/21 1501  Vitals shown include unvalidated device data.  Last Pain:  Vitals:   05/17/21 1210  TempSrc:   PainSc: 0-No pain         Complications: No notable events documented.

## 2021-05-17 NOTE — H&P (Signed)
Mitchell Hughes is an 56 y.o. male.    Chief Complaint: Pre-OP RIGHT Percutaneous Nephrostolithotomy  HPI:    1 - Large RIGHT Renal Stone - 2.6cm lower Rt renal stone with focal hydro by ER CT 04/2021 on eval flank pain. IR neph tube placed 04/2021.   2 - LEFT Renal Mass - 6cm left mass c/w renal cell carcinoma  3 - History of Urosepsis - Klebsiella pyelo 12//2022 sens cipro.  Today "Mitchell Hughes" is seen to proceed with RIGHT percutnateous nephrostolithotomy for large Rt renal stone. No interval fevers. He has been on cipro pre-op accordign to CX.   Past Medical History:  Diagnosis Date   Erectile dysfunction    Family history of colon cancer    sister in 71s   Family history of heart disease 2011   baseline cardiac eval 2011 with Dr. Sallyanne Kuster   H/O echocardiogram 2015   Dr. Wynonia Lawman   History of exercise stress test    04/2014 Dr. Wynonia Lawman, prior 2011 normal Bruce treadmill stress test, Dr. Debara Pickett   LOW BACK PAIN, ACUTE 09/26/2009   RENAL CALCULUS, RIGHT 09/28/2009   9.24mm on CT   TOBACCO USE, QUIT 09/26/2009   Wears glasses     Past Surgical History:  Procedure Laterality Date   COLONOSCOPY  08/2017   tubular adenoma, repeat 5 years; Dr. [place] Cellar   FOOT SURGERY     bone spur, right; Dr. Milinda Pointer   IR NEPHROSTOMY PLACEMENT RIGHT  04/14/2021   WISDOM TOOTH EXTRACTION     WISDOM TOOTH EXTRACTION      Family History  Problem Relation Age of Onset   Thyroid disease Mother    Other Mother        brain disease, possibly dementia?   Gallstones Mother    Other Father        spinal cord injury/accident   Heart disease Father 77   Heart disease Sister 40       artificial valve   Colon cancer Sister    Heart disease Brother 64       MI   Diabetes Brother    Pancreatic cancer Brother    Cancer Sister 43       colon   Cancer Sister        pancreas   Pancreatic cancer Sister    Heart disease Sister    Heart disease Brother 17       pacemaker   Cancer Brother        liver,  formerly pancreas   Heart disease Other        parent, other relative   Colon cancer Other    Stroke Neg Hx    Hypertension Neg Hx    Rectal cancer Neg Hx    Stomach cancer Neg Hx    Social History:  reports that he quit smoking about 12 years ago. His smoking use included cigarettes. He has a 7.50 pack-year smoking history. He has never used smokeless tobacco. He reports that he does not currently use alcohol after a past usage of about 1.0 standard drink per week. He reports that he does not use drugs.  Allergies: No Known Allergies  No medications prior to admission.    Results for orders placed or performed during the hospital encounter of 05/15/21 (from the past 48 hour(s))  SARS CORONAVIRUS 2 (TAT 6-24 HRS) Nasopharyngeal Nasopharyngeal Swab     Status: None   Collection Time: 05/15/21 12:14 PM   Specimen: Nasopharyngeal Swab  Result Value Ref Range   SARS Coronavirus 2 NEGATIVE NEGATIVE    Comment: (NOTE) SARS-CoV-2 target nucleic acids are NOT DETECTED.  The SARS-CoV-2 RNA is generally detectable in upper and lower respiratory specimens during the acute phase of infection. Negative results do not preclude SARS-CoV-2 infection, do not rule out co-infections with other pathogens, and should not be used as the sole basis for treatment or other patient management decisions. Negative results must be combined with clinical observations, patient history, and epidemiological information. The expected result is Negative.  Fact Sheet for Patients: SugarRoll.be  Fact Sheet for Healthcare Providers: https://www.woods-mathews.com/  This test is not yet approved or cleared by the [place] FDA and  has been authorized for detection and/or diagnosis of SARS-CoV-2 by FDA under an Emergency Use Authorization (EUA). This EUA will remain  in effect (meaning this test can be used) for the duration of the COVID-19 declaration under Se ction  564(b)(1) of the Act, 21 U.S.C. section 360bbb-3(b)(1), unless the authorization is terminated or revoked sooner.  Performed at [facility], [place] [address] [place], [place] [address]    No results found.  Review of Systems  Constitutional:  Negative for chills and fever.  All other systems reviewed and are negative.  There were no vitals taken for this visit. Physical Exam Vitals reviewed.  HENT:     Head: Normocephalic.     Nose: Nose normal.  Eyes:     Pupils: Pupils are equal, round, and reactive to light.  Cardiovascular:     Rate and Rhythm: Normal rate.  Pulmonary:     Effort: Pulmonary effort is normal.  Abdominal:     General: Abdomen is flat.  Genitourinary:    Comments: Rt neph tube c/d/I with non-foul urine.  Musculoskeletal:        General: Normal range of motion.     Cervical back: Normal range of motion.  Skin:    General: Skin is warm.  Neurological:     General: No focal deficit present.     Mental Status: He is alert.     Assessment/Plan  Proceed as planned with RIGHT PCNL. Risks, benefits, alternatives, expected peri-op course discussed previously and reiterated today. Overall plan is address stone, restagin imaging, then likely left nephrectomy in elective setting in few months.   Alexis Frock, MD 05/17/2021, 7:24 AM

## 2021-05-17 NOTE — Discharge Instructions (Addendum)
[facility] [phone] Post Ureteroscopy With or Without Stent Instructions  Definitions:  Ureter: The duct that transports urine from the kidney to the bladder. Stent:   A plastic hollow tube that is placed into the ureter, from the kidney to the bladder to prevent the ureter from swelling shut.  GENERAL INSTRUCTIONS:  Despite the fact that no skin incisions were used, the area around the ureter and bladder is raw and irritated. The stent is a foreign body which will further irritate the bladder wall. This irritation is manifested by increased frequency of urination, both day and night, and by an increase in the urge to urinate. In some, the urge to urinate is present almost always. Sometimes the urge is strong enough that you may not be able to stop yourself from urinating. The only real cure is to remove the stent and then give time for the bladder wall to heal which can't be done until the danger of the ureter swelling shut has passed, which varies.  You may see some blood in your urine while the stent is in place and a few days afterwards. Do not be alarmed, even if the urine was clear for a while. Get off your feet and drink lots of fluids until clearing occurs. If you start to pass clots or don't improve, call us.  DIET: You may return to your normal diet immediately. Because of the raw surface of your bladder, alcohol, spicy foods, acid type foods and drinks with caffeine may cause irritation or frequency and should be used in moderation. To keep your urine flowing freely and to avoid constipation, drink plenty of fluids during the day ( 8-10 glasses ). Tip: Avoid cranberry juice because it is very acidic.  ACTIVITY: Your physical activity doesn't need to be restricted. However, if you are very active, you may see some blood in your urine. We suggest that you reduce your activity under these circumstances until the bleeding has stopped.  BOWELS: It is important to  keep your bowels regular during the postoperative period. Straining with bowel movements can cause bleeding. A bowel movement every other day is reasonable. Use a mild laxative if needed, such as Milk of Magnesia 2-3 tablespoons, or 2 Dulcolax tablets. Call if you continue to have problems. If you have been taking narcotics for pain, before, during or after your surgery, you may be constipated. Take a laxative if necessary.   MEDICATION: You should resume your pre-surgery medications unless told not to. In addition you will often be given an antibiotic to prevent infection. These should be taken as prescribed until the bottles are finished unless you are having an unusual reaction to one of the drugs.  PROBLEMS YOU SHOULD REPORT TO [place]: Fevers over 100.5 Fahrenheit. Heavy bleeding, or clots ( See above notes about blood in urine ). Inability to urinate. Drug reactions ( hives, rash, nausea, vomiting, diarrhea ). Severe burning or pain with urination that is not improving.

## 2021-05-17 NOTE — Anesthesia Postprocedure Evaluation (Signed)
Anesthesia Post Note  Patient: Mitchell Hughes  Procedure(s) Performed: NEPHROLITHOTOMY PERCUTANEOUS insertion double j stent (Right: Ureter)     Patient location during evaluation: PACU Anesthesia Type: General Level of consciousness: awake and alert and oriented Pain management: pain level controlled Vital Signs Assessment: post-procedure vital signs reviewed and stable Respiratory status: spontaneous breathing, nonlabored ventilation and respiratory function stable Cardiovascular status: blood pressure returned to baseline and stable Postop Assessment: no apparent nausea or vomiting Anesthetic complications: no   No notable events documented.  Last Vitals:  Vitals:   05/17/21 1456 05/17/21 1500  BP: (!) 143/80 124/87  Pulse: 71 62  Resp: 15 16  Temp: 36.7 C   SpO2: 99% 100%    Last Pain:  Vitals:   05/17/21 1456  TempSrc:   PainSc: 0-No pain                 Perlie Stene A.

## 2021-05-17 NOTE — Anesthesia Procedure Notes (Signed)
Procedure Name: Intubation Date/Time: 05/17/2021 1:19 PM Performed by: Genelle Bal, CRNA Pre-anesthesia Checklist: Patient identified, Emergency Drugs available, Suction available and Patient being monitored Patient Re-evaluated:Patient Re-evaluated prior to induction Oxygen Delivery Method: Circle system utilized Preoxygenation: Pre-oxygenation with 100% oxygen Induction Type: IV induction Ventilation: Mask ventilation without difficulty Laryngoscope Size: Miller and 2 Grade View: Grade I Tube type: Oral Tube size: 7.5 mm Number of attempts: 1 Airway Equipment and Method: Stylet and Oral airway Placement Confirmation: ETT inserted through vocal cords under direct vision, positive ETCO2 and breath sounds checked- equal and bilateral Secured at: 23 cm Tube secured with: Tape Dental Injury: Teeth and Oropharynx as per pre-operative assessment

## 2021-05-18 ENCOUNTER — Encounter (HOSPITAL_COMMUNITY): Payer: Self-pay | Admitting: Urology

## 2021-05-18 DIAGNOSIS — N200 Calculus of kidney: Secondary | ICD-10-CM | POA: Diagnosis not present

## 2021-05-18 DIAGNOSIS — Z87891 Personal history of nicotine dependence: Secondary | ICD-10-CM | POA: Diagnosis not present

## 2021-05-18 LAB — HEMOGLOBIN AND HEMATOCRIT, BLOOD
HCT: 43.8 % (ref 39.0–52.0)
Hemoglobin: 14.7 g/dL (ref 13.0–17.0)

## 2021-05-18 LAB — BASIC METABOLIC PANEL
Anion gap: 8 (ref 5–15)
BUN: 21 mg/dL — ABNORMAL HIGH (ref 6–20)
CO2: 22 mmol/L (ref 22–32)
Calcium: 9.2 mg/dL (ref 8.9–10.3)
Chloride: 107 mmol/L (ref 98–111)
Creatinine, Ser: 0.99 mg/dL (ref 0.61–1.24)
GFR, Estimated: >60 mL/min (ref >60)
Glucose, Bld: 137 mg/dL — ABNORMAL HIGH (ref 70–99)
Potassium: 4.3 mmol/L (ref 3.5–5.1)
Sodium: 137 mmol/L (ref 135–145)

## 2021-05-18 MED ORDER — SULFAMETHOXAZOLE-TRIMETHOPRIM 800-160 MG PO TABS: 1.0000 | ORAL_TABLET | Freq: Two times a day (BID) | ORAL | 0 refills | Status: DC

## 2021-05-18 MED ORDER — OXYBUTYNIN CHLORIDE 5 MG PO TABS: 5.0000 mg | ORAL_TABLET | Freq: Three times a day (TID) | ORAL | 1 refills | Status: DC | PRN

## 2021-05-18 MED ORDER — PHENAZOPYRIDINE HCL 200 MG PO TABS: 200.0000 mg | ORAL_TABLET | Freq: Three times a day (TID) | ORAL | 0 refills | Status: DC | PRN

## 2021-05-18 MED ORDER — TAMSULOSIN HCL 0.4 MG PO CAPS: 0.4000 mg | ORAL_CAPSULE | Freq: Every evening | ORAL | 0 refills | Status: DC

## 2021-05-18 MED ORDER — OXYCODONE-ACETAMINOPHEN 5-325 MG PO TABS: 1.0000 | ORAL_TABLET | Freq: Four times a day (QID) | ORAL | 0 refills | Status: DC | PRN

## 2021-05-18 NOTE — Progress Notes (Signed)
1 Day Post-Op Subjective: Mitchell Hughes is doing well today. No acute complaints. He is voiding without issue s/p foley removal  Objective: Vital signs in last 24 hours: Temp:  [97.7 F (36.5 C)-98.2 F (36.8 C)] 98.2 F (36.8 C) (01/14 0132) Pulse Rate:  [55-71] 63 (01/14 0132) Resp:  [11-20] 16 (01/14 0132) BP: (116-149)/(62-124) 130/76 (01/14 0132) SpO2:  [95 %-100 %] 95 % (01/14 0132)  Intake/Output from previous day: 01/13 0701 - 01/14 0700 In: 1564.5 [P.O.:120; I.V.:1332.7; IV Piggyback:111.8] Out: 3100 [Urine:3000; Blood:100] Intake/Output this shift: Total I/O In: 1357.1 [P.O.:960; I.V.:397.1] Out: 1125 [Urine:1125]  Physical Exam:  General: Alert and oriented CV: RRR Lungs: Clear Abdomen: Soft, ND, ATTP; bandage intact on R flank Ext: NT, No erythema  Lab Results: Recent Labs    05/17/21 1158 05/17/21 1601 05/18/21 0539  HGB 14.9 13.9 14.7  HCT 43.2 41.9 43.8   BMET Recent Labs    05/17/21 1158 05/18/21 0539  NA 134* 137  K 4.0 4.3  CL 108 107  CO2 22 22  GLUCOSE 107* 137*  BUN 17 21*  CREATININE 1.05 0.99  CALCIUM 9.1 9.2     Studies/Results: DG C-Arm 1-60 Min-No Report  Result Date: 05/17/2021 Fluoroscopy was utilized by the requesting physician.  No radiographic interpretation.   DG C-Arm 1-60 Min-No Report  Result Date: 05/17/2021 Fluoroscopy was utilized by the requesting physician.  No radiographic interpretation.    Assessment/Plan: S/p R PCNL  - doing well, will discharge home   LOS: 0 days   Donald Pore MD 05/18/2021, 1:18 PM [facility]  Pager: [phone]

## 2021-05-18 NOTE — Plan of Care (Signed)
PIV removed. DC paperwork reviewed with pt.   Problem: Education: Goal: Knowledge of General Education information will improve Description: Including pain rating scale, medication(s)/side effects and non-pharmacologic comfort measures Outcome: Completed/Met   Problem: Health Behavior/Discharge Planning: Goal: Ability to manage health-related needs will improve Outcome: Completed/Met   Problem: Clinical Measurements: Goal: Ability to maintain clinical measurements within normal limits will improve Outcome: Completed/Met Goal: Will remain free from infection Outcome: Completed/Met Goal: Diagnostic test results will improve Outcome: Completed/Met Goal: Respiratory complications will improve Outcome: Completed/Met Goal: Cardiovascular complication will be avoided Outcome: Completed/Met   Problem: Activity: Goal: Risk for activity intolerance will decrease Outcome: Completed/Met   Problem: Nutrition: Goal: Adequate nutrition will be maintained Outcome: Completed/Met   Problem: Coping: Goal: Level of anxiety will decrease Outcome: Completed/Met   Problem: Elimination: Goal: Will not experience complications related to bowel motility Outcome: Completed/Met Goal: Will not experience complications related to urinary retention Outcome: Completed/Met   Problem: Pain Managment: Goal: General experience of comfort will improve Outcome: Completed/Met   Problem: Safety: Goal: Ability to remain free from injury will improve Outcome: Completed/Met   Problem: Skin Integrity: Goal: Risk for impaired skin integrity will decrease Outcome: Completed/Met   Problem: Education: Goal: Required Educational Video(s) Outcome: Completed/Met   Problem: Clinical Measurements: Goal: Ability to maintain clinical measurements within normal limits will improve Outcome: Completed/Met Goal: Postoperative complications will be avoided or minimized Outcome: Completed/Met   Problem: Skin  Integrity: Goal: Demonstration of wound healing without infection will improve Outcome: Completed/Met

## 2021-05-18 NOTE — Discharge Summary (Signed)
Date of admission: 05/17/2021  Date of discharge: 05/18/2021  Admission diagnosis: R renal stone  Discharge diagnosis: same, s/p R PCNL  History and Physical: For full details, please see admission history and physical. Briefly, Mitchell Hughes is a 56 y.o. year old patient with a large R renal stone. He was admitted after undergoing R PCNL.   Hospital Course: POD 1 Mr Mitchell Hughes' foley was removed. He was tolerating PO and his pain was well controlled, and he was stable for discharge  Laboratory values:  Recent Labs    05/17/21 1158 05/17/21 1601 05/18/21 0539  HGB 14.9 13.9 14.7  HCT 43.2 41.9 43.8   Recent Labs    05/17/21 1158 05/18/21 0539  CREATININE 1.05 0.99    Disposition: Home   Discharge medications:  Allergies as of 05/18/2021   No Known Allergies      Medication List     TAKE these medications    gabapentin 300 MG capsule Commonly known as: NEURONTIN Take 1 capsule (300 mg total) by mouth at bedtime.   ibuprofen 800 MG tablet Commonly known as: ADVIL Take 1 tablet (800 mg total) by mouth 3 (three) times daily.   oxybutynin 5 MG tablet Commonly known as: DITROPAN Take 1 tablet (5 mg total) by mouth 3 (three) times daily as needed for bladder spasms.   oxyCODONE-acetaminophen 5-325 MG tablet Commonly known as: PERCOCET/ROXICET Take 1 tablet by mouth every 6 (six) hours as needed for severe pain.   phenazopyridine 200 MG tablet Commonly known as: Pyridium Take 1 tablet (200 mg total) by mouth 3 (three) times daily as needed for pain.   sildenafil 100 MG tablet Commonly known as: VIAGRA Take 1 tablet (100 mg total) by mouth daily as needed for erectile dysfunction.   sulfamethoxazole-trimethoprim 800-160 MG tablet Commonly known as: BACTRIM DS Take 1 tablet by mouth 2 (two) times daily. Begin taking the day before your stent removal procedure   tamsulosin 0.4 MG Caps capsule Commonly known as: Flomax Take 1 capsule (0.4 mg total) by mouth at  bedtime.        Followup:   Follow-up Information     Alexis Frock, MD Follow up.   Specialty: Urology Why: Office will call to arrange post-op visit in about 3 weeks for stent removal. Contact information: [place] [address] [phone]

## 2021-05-18 NOTE — TOC CM/SW Note (Signed)
°  Transition of Care [facility]) Screening Note   Patient Details  Name: BERTIL BRICKEY Date of Birth: 1966-02-29   Transition of [facility]) CM/SW Contact:    Ross Ludwig, LCSW Phone Number: 05/18/2021, 1:32 PM    Transition of Care Department [facility]) has reviewed patient and no TOC needs have been identified at this time. We will continue to monitor patient advancement through interdisciplinary progression rounds. If new patient transition needs arise, please place a TOC consult.

## 2021-05-18 NOTE — Op Note (Signed)
Mitchell Hughes, SOLEM MEDICAL RECORD NO: [id number] ACCOUNT NO: [id number] DATE OF BIRTH: 10-28-1965 FACILITY: [facility] LOCATION: [facility] PHYSICIAN: Alexis Frock, MD  Operative Report   SURGEON:  Alexis Frock, MD  PREOPERATIVE DIAGNOSIS:  Large right renal stone, left renal mass.  PROCEDURES: 1.  Right percutaneous nephrostolithotomy, stone greater than 2 cm, complex with antegrade ureteroscopy. 2.  Dilation of percutaneous tract. 3.  Right antegrade nephrostogram. 4.  Insertion of right ureteral stent.  ESTIMATED BLOOD LOSS:  100 mL.  COMPLICATIONS:  None.  SPECIMEN:  Right renal stone fragments for composition analysis.  FINDINGS: 1.  Large lower pole infundibular stone as anticipated. 2.  Complete resolution of all stone fragments larger than one-third mm following percutaneous nephrostolithotomy, ureteroscopy, basket extraction. 3.  Successful placement of right ureteral stent, proximal end in renal pelvis, distal end in urinary bladder.  INDICATIONS:  The patient is a very pleasant 56 year old man.  He was found on workup of flank pain and sepsis to have a large right infundibular stone last month.  He unfortunately also has a large contralateral left renal mass concerning for stage II+  renal cell carcinoma. Recommended path was addressing his right kidney first.  He underwent right nephrostomy tube placement on hospital admission and cleared his infectious parameters.  He now presents for definitive stone management of his right  kidney, which is likely to become his solitary kidney in the future, and given the large stone size of greater than 2 cm, percutaneous approach clearly warranted.  Informed consent was obtained and placed on the medical record.  PROCEDURE IN DETAIL:  The patient being verified and procedure being right percutaneous nephrostolithotomy for stone greater than 2 cm was confirmed. Procedure timeout was performed.  Intravenous antibiotics administered.   General endotracheal anesthesia  introduced.  The patient was placed into a supine position on the stretcher. The patient was repositioned in a prone position, lying prone view, chest rolls, axillary rolls and sequential compression devices, padding of his knees, ankles and elbows.   After Foley catheter in placed per urethra for straight drain, a sterile field was created.  Prepped and draped the patient's right flank and in situ nephrostomy tube was capped using chlorhexidine gluconate and percutaneous drape was applied.   Initial right antegrade nephrostogram revealed good placement of right nephrostomy tube in the lower pole with somewhat dilated calix.  Large stone was noted in infundibular location.  Contrast did proceed antegrade to this. ZIPwire was advanced and  coiled the liver to the lower pole over which a KMP catheter was placed using fluoroscopic guidance in multiple angulations of the KMP catheter. A ZIPwire wire was navigated past the stone and then down the ureter all the way to the level of the bladder  and exchanged for a Super Stiff wire via the KMP catheter.  A peel-away sheath was very carefully advanced in antegrade fashion over the Super Stiff wire at the level of the proximal ureter through which a second ZIPwire was advanced and exchanged for a  second Super Stiff wire having obtained Dual Super Stiff wire access from the percutaneous tract to the level of the bladder.  Incision was made in the flank approximately 2 cm in length.  The fascia dilated using Kelly forceps under fluoroscopic vision  and the 30-French balloon dilation apparatus was carefully advanced across the lower pole calix, inflated to a pressure of 20 atmospheres, held for 90 seconds and the sheath advanced across this using fluoroscopic guidance.  Rigid nephroscopy  was then  performed.  This demonstrated excellent sheath placement and relatively dilated lower pole calix and excellent on axis visualization of large  infundibular stone.  Next, lithoclast dual Ultrasound pneumatic energy was applied to the stone, approximately 45  minutes in a very careful and deliberate fashion taking exquisite care to avoid excessive medial tension and the stone was completely ablated using this technique.  I was quite happy with the outcome of this.  Exquisite care was taken to purposely avoid  generation of multiple fragments.  Next, flexible nephroscopy was performed using a 16-French flexible cystoscope and all accessible calices were visualized.  There was no evidence of any injury, perforation or additional fragments.  The level of the UPJ  was examined as well and a ZIPwire was advanced to the level of the bladder when a Super Stiff wire was removed and a single channel digital ureteroscope was advanced to the level of the bladder using fluoroscopic guidance and flexible digital  ureteroscopy was performed on the entire length of right ureter.  There was one fragment noted in the mid ureter approximately 3-4 mm.  This was grasped with the escape basket and removed and set aside.  Following this, complete resolution of all  accessible stone fragments larger than one-third mm within the kidney and ureter all the way to the bladder, we achieved the goals of the stone removal and goals of surgery today.   Given excellent hemostasis, a tubeless technique would be optimal and a 6 x 26  contour type catheter was advanced under nephroscopic guidance in antegrade fashion using fluoroscopic guidance.  Proximal deployment was noted in the renal pelvis, distal deployment in the urinary bladder.  Safety wire was removed.  The tract was  closed using 10 mL of FloSeal, which was placed using radiopaque applicator along the tract under fluoroscopic vision. The skin was reapproximated using interrupted Vicryl.  Procedure was terminated.  The patient tolerated procedure well.  No immediate   perioperative complications.  The patient was taken to  postanesthesia care unit in stable condition.  Plan for observation, admission, likely discharge home tomorrow pending clinical status.  He will then undergo removal of the stent, restaging imaging and then likely switch towards addressing his left renal mass.   NIK D: 05/17/2021 2:54:26 pm T: 05/18/2021 1:58:00 am  JOB: [id number]/ [id number]

## 2021-05-20 ENCOUNTER — Other Ambulatory Visit: Payer: Self-pay | Admitting: Medical

## 2021-06-06 DIAGNOSIS — N200 Calculus of kidney: Secondary | ICD-10-CM | POA: Diagnosis not present

## 2021-06-10 ENCOUNTER — Other Ambulatory Visit: Payer: Self-pay | Admitting: Urology

## 2021-06-14 MED ORDER — POLYETHYLENE GLYCOL 3350 17 GM/SCOOP PO POWD: 1.0000 | Freq: Once | ORAL | Status: AC

## 2021-06-18 ENCOUNTER — Ambulatory Visit: Payer: BC Managed Care – PPO | Admitting: Diagnostic Neuroimaging

## 2021-07-03 DIAGNOSIS — C642 Malignant neoplasm of left kidney, except renal pelvis: Secondary | ICD-10-CM | POA: Diagnosis not present

## 2021-07-03 DIAGNOSIS — N200 Calculus of kidney: Secondary | ICD-10-CM | POA: Diagnosis not present

## 2021-07-08 ENCOUNTER — Telehealth: Payer: Self-pay | Admitting: Neurology

## 2021-07-08 ENCOUNTER — Institutional Professional Consult (permissible substitution): Payer: BC Managed Care – PPO | Admitting: Neurology

## 2021-07-08 DIAGNOSIS — C642 Malignant neoplasm of left kidney, except renal pelvis: Secondary | ICD-10-CM | POA: Diagnosis not present

## 2021-07-08 NOTE — Telephone Encounter (Signed)
Pt cancelled appt due to scheduling conflict. Transferred to Billing. 

## 2021-07-16 NOTE — Patient Instructions (Addendum)
DUE TO COVID-19 ONLY ONE VISITOR  (aged 57 and older)  IS ALLOWED TO COME WITH YOU AND STAY IN THE WAITING ROOM ONLY DURING PRE OP AND PROCEDURE.   ?**NO VISITORS ARE ALLOWED IN THE SHORT STAY AREA OR RECOVERY ROOM!!** ? ?IF YOU WILL BE ADMITTED INTO THE HOSPITAL YOU ARE ALLOWED ONLY TWO SUPPORT PEOPLE DURING VISITATION HOURS ONLY (7 AM -8PM)   ?The support person(s) must pass our screening, gel in and out, and wear a mask at all times, including in the patient?s room. ?Patients must also wear a mask when staff or their support person are in the room. ?Visitors GUEST BADGE MUST BE WORN VISIBLY  ?One adult visitor may remain with you overnight and MUST be in the room by 8 P.M. ?  ? Your procedure is scheduled on: 07/26/21 ? ? Report to [facility] Main Entrance ? ?  Report to admitting at : 8:45 AM ? ? Call this number if you have problems the morning of surgery [phone] ? ? Do not eat food :After Midnight. ? ? After Midnight you may have the following liquids until: 8:00 AM   DAY OF SURGERY ? ?Water ?Black Coffee (sugar ok, NO MILK/CREAM OR CREAMERS)  ?Tea (sugar ok, NO MILK/CREAM OR CREAMERS) regular and decaf                             ?Plain Jell-O (NO RED)                                           ?Fruit ices (not with fruit pulp, NO RED)                                     ?Popsicles (NO RED)                                                                  ?Juice: apple, WHITE grape, WHITE cranberry ?Sports drinks like Gatorade (NO RED) ?Clear broth(vegetable,chicken,beef) ? ?FOLLOW BOWEL PREP AND ANY ADDITIONAL PRE OP INSTRUCTIONS YOU RECEIVED FROM YOUR SURGEON'S OFFICE!!! ?  ? Oral Hygiene is also important to reduce your risk of infection.                                    ?Remember - BRUSH YOUR TEETH THE MORNING OF SURGERY WITH YOUR REGULAR TOOTHPASTE ? ? Do NOT smoke after Midnight ? ? Take these medicines the morning of surgery with A SIP OF WATER:  ? ?DO NOT TAKE ANY ORAL DIABETIC  MEDICATIONS DAY OF YOUR SURGERY ? ?Bring CPAP mask and tubing day of surgery. ?                  ?           You may not have any metal on your body including hair pins, jewelry, and body piercing ? ?  Do not wear  lotions, powders, perfumes/cologne, or deodoran ? ?            Men may shave face and neck. ? ? Do not bring valuables to the hospital. [facility] NOT ?            RESPONSIBLE   FOR VALUABLES. ? ? Contacts, dentures or bridgework may not be worn into surgery. ? ? Bring small overnight bag day of surgery. ?  ? Patients discharged on the day of surgery will not be allowed to drive home.  Someone NEEDS to stay with you for the first 24 hours after anesthesia. ? ? Special Instructions: Bring a copy of your healthcare power of attorney and living will documents         the day of surgery if you haven't scanned them before. ? ?            Please read over the following fact sheets you were given: IF [place] [phone] ? ?   [place] - Preparing for Surgery ?Before surgery, you can play an important role.  Because skin is not sterile, your skin needs to be as free of germs as possible.  You can reduce the number of germs on your skin by washing with CHG (chlorahexidine gluconate) soap before surgery.  CHG is an antiseptic cleaner which kills germs and bonds with the skin to continue killing germs even after washing. ?Please DO NOT use if you have an allergy to CHG or antibacterial soaps.  If your skin becomes reddened/irritated stop using the CHG and inform your nurse when you arrive at Short Stay. ?Do not shave (including legs and underarms) for at least 48 hours prior to the first CHG shower.  You may shave your face/neck. ?Please follow these instructions carefully: ? 1.  Shower with CHG Soap the night before surgery and the  morning of Surgery. ? 2.  If you choose to wash your hair, wash your hair first as usual with your  normal   shampoo. ? 3.  After you shampoo, rinse your hair and body thoroughly to remove the  shampoo.                           4.  Use CHG as you would any other liquid soap.  You can apply chg directly  to the skin and wash  ?                     Gently with a scrungie or clean washcloth. ? 5.  Apply the CHG Soap to your body ONLY FROM THE NECK DOWN.   Do not use on face/ open      ?                     Wound or open sores. Avoid contact with eyes, ears mouth and genitals (private parts).  ?                     Production manager,  Genitals (private parts) with your normal soap. ?            6.  Wash thoroughly, paying special attention to the area where your surgery  will be performed. ? 7.  Thoroughly rinse your body with warm water from the neck down. ? 8.  DO NOT shower/wash with your normal  soap after using and rinsing off  the CHG Soap. ?               9.  Pat yourself dry with a clean towel. ?           10.  Wear clean pajamas. ?           11.  Place clean sheets on your bed the night of your first shower and do not  sleep with pets. ?Day of Surgery : ?Do not apply any lotions/deodorants the morning of surgery.  Please wear clean clothes to the hospital/surgery center. ? ?FAILURE TO FOLLOW THESE INSTRUCTIONS MAY RESULT IN THE CANCELLATION OF YOUR SURGERY ?PATIENT SIGNATURE_________________________________ ? ?NURSE SIGNATURE__________________________________ ? ?________________________________________________________________________  ?

## 2021-07-17 ENCOUNTER — Other Ambulatory Visit: Payer: Self-pay

## 2021-07-17 ENCOUNTER — Encounter (HOSPITAL_COMMUNITY): Payer: Self-pay

## 2021-07-17 ENCOUNTER — Encounter (HOSPITAL_COMMUNITY)
Admission: RE | Admit: 2021-07-17 | Discharge: 2021-07-17 | Disposition: A | Discharge status: Home / Self Care | Payer: BC Managed Care – PPO | Source: Ambulatory Visit | Attending: Urology | Admitting: Urology

## 2021-07-17 VITALS — BP 135/83 | HR 76 | Temp 98.7°F | Ht 77.0 in | Wt 236.0 lb

## 2021-07-17 DIAGNOSIS — Z01812 Encounter for preprocedural laboratory examination: Secondary | ICD-10-CM | POA: Diagnosis not present

## 2021-07-17 DIAGNOSIS — Z01818 Encounter for other preprocedural examination: Secondary | ICD-10-CM

## 2021-07-17 HISTORY — DX: Unspecified osteoarthritis, unspecified site: M19.90

## 2021-07-17 HISTORY — DX: Personal history of urinary calculi: Z87.442

## 2021-07-17 LAB — CBC
HCT: 46.5 % (ref 39.0–52.0)
Hemoglobin: 15.8 g/dL (ref 13.0–17.0)
MCH: 29.9 pg (ref 26.0–34.0)
MCHC: 34 g/dL (ref 30.0–36.0)
MCV: 88.1 fL (ref 80.0–100.0)
Platelets: 229 10*3/uL (ref 150–400)
RBC: 5.28 MIL/uL (ref 4.22–5.81)
RDW: 12.7 % (ref 11.5–15.5)
WBC: 4.5 10*3/uL (ref 4.0–10.5)
nRBC: 0 % (ref 0.0–0.2)

## 2021-07-17 LAB — BASIC METABOLIC PANEL
Anion gap: 7 (ref 5–15)
BUN: 17 mg/dL (ref 6–20)
CO2: 22 mmol/L (ref 22–32)
Calcium: 9.2 mg/dL (ref 8.9–10.3)
Chloride: 108 mmol/L (ref 98–111)
Creatinine, Ser: 1.1 mg/dL (ref 0.61–1.24)
GFR, Estimated: >60 mL/min (ref >60)
Glucose, Bld: 98 mg/dL (ref 70–99)
Potassium: 4.1 mmol/L (ref 3.5–5.1)
Sodium: 137 mmol/L (ref 135–145)

## 2021-07-17 NOTE — Progress Notes (Signed)
For Short Stay: ?[place] appointment date: N/A ?Date of COVID positive in last 90 days: N/A ?COVID Vaccine: Pfizer x 3: 2021 ?Bowel Prep reminder: Reviewed ? ? ?For Anesthesia: ?PCP - Chana Bode: PA-C ?Cardiologist -  ? ?Chest x-ray - 04/14/21 ?EKG - 04/15/21 ?Stress Test -  ?ECHO -  ?Cardiac Cath -  ?Pacemaker/ICD device last checked: ?Pacemaker orders received: ?Device Rep notified: ? ?Spinal Cord Stimulator: ? ?Sleep Study -  ?CPAP -  ? ?Fasting Blood Sugar -  ?Checks Blood Sugar _____ times a day ?Date and result of last Hgb A1c- ? ?Blood Thinner Instructions: ?Aspirin Instructions: ?Last Dose: ? ?Activity level: Can go up a flight of stairs and activities of daily living without stopping and without chest pain and/or shortness of breath ?  Able to exercise without chest pain and/or shortness of breath ?  Unable to go up a flight of stairs without chest pain and/or shortness of breath ?   ? ?Anesthesia review:  ? ?Patient denies shortness of breath, fever, cough and chest pain at PAT appointment ? ? ?Patient verbalized understanding of instructions that were given to them at the PAT appointment. Patient was also instructed that they will need to review over the PAT instructions again at home before surgery.  ?

## 2021-07-26 ENCOUNTER — Inpatient Hospital Stay (HOSPITAL_COMMUNITY): Payer: BC Managed Care – PPO | Admitting: Anesthesiology

## 2021-07-26 ENCOUNTER — Encounter (HOSPITAL_COMMUNITY): Payer: Self-pay | Admitting: Urology

## 2021-07-26 ENCOUNTER — Inpatient Hospital Stay (HOSPITAL_COMMUNITY)
Admission: RE | Admit: 2021-07-26 | Discharge: 2021-07-28 | DRG: 658 | Disposition: A | Discharge status: Home / Self Care | Payer: BC Managed Care – PPO | Source: Ambulatory Visit | Attending: Urology | Admitting: Urology

## 2021-07-26 ENCOUNTER — Other Ambulatory Visit: Payer: Self-pay

## 2021-07-26 ENCOUNTER — Encounter (HOSPITAL_COMMUNITY)
Admission: RE | Disposition: A | Discharge status: Home / Self Care | Payer: Self-pay | Source: Ambulatory Visit | Attending: Urology

## 2021-07-26 DIAGNOSIS — R590 Localized enlarged lymph nodes: Secondary | ICD-10-CM | POA: Diagnosis present

## 2021-07-26 DIAGNOSIS — N2889 Other specified disorders of kidney and ureter: Secondary | ICD-10-CM | POA: Diagnosis not present

## 2021-07-26 DIAGNOSIS — Z87442 Personal history of urinary calculi: Secondary | ICD-10-CM | POA: Diagnosis not present

## 2021-07-26 DIAGNOSIS — N4889 Other specified disorders of penis: Secondary | ICD-10-CM | POA: Diagnosis not present

## 2021-07-26 DIAGNOSIS — C642 Malignant neoplasm of left kidney, except renal pelvis: Secondary | ICD-10-CM | POA: Diagnosis not present

## 2021-07-26 DIAGNOSIS — M1990 Unspecified osteoarthritis, unspecified site: Secondary | ICD-10-CM | POA: Diagnosis present

## 2021-07-26 DIAGNOSIS — N200 Calculus of kidney: Secondary | ICD-10-CM | POA: Diagnosis present

## 2021-07-26 DIAGNOSIS — N529 Male erectile dysfunction, unspecified: Secondary | ICD-10-CM | POA: Diagnosis not present

## 2021-07-26 DIAGNOSIS — Z87891 Personal history of nicotine dependence: Secondary | ICD-10-CM | POA: Diagnosis not present

## 2021-07-26 HISTORY — PX: ROBOT ASSISTED LAPAROSCOPIC NEPHRECTOMY: SHX5140

## 2021-07-26 LAB — TYPE AND SCREEN
ABO/RH(D): AB POS
Antibody Screen: NEGATIVE

## 2021-07-26 LAB — ABO/RH: ABO/RH(D): AB POS

## 2021-07-26 LAB — HEMOGLOBIN AND HEMATOCRIT, BLOOD
HCT: 44.7 % (ref 39.0–52.0)
Hemoglobin: 15.2 g/dL (ref 13.0–17.0)

## 2021-07-26 SURGERY — NEPHRECTOMY, RADICAL, ROBOT-ASSISTED, LAPAROSCOPIC, ADULT
Anesthesia: General | Laterality: Left

## 2021-07-26 MED ORDER — FENTANYL CITRATE (PF) 250 MCG/5ML IJ SOLN
INTRAMUSCULAR | Status: AC
  Filled 2021-07-26: qty 5

## 2021-07-26 MED ORDER — BACITRACIN-NEOMYCIN-POLYMYXIN 400-5-5000 EX OINT: 1.0000 "application " | TOPICAL_OINTMENT | Freq: Three times a day (TID) | CUTANEOUS | Status: DC | PRN

## 2021-07-26 MED ORDER — LACTATED RINGERS IR SOLN
Status: DC | PRN
  Administered 2021-07-26: 1000 mL

## 2021-07-26 MED ORDER — DIPHENHYDRAMINE HCL 50 MG/ML IJ SOLN: 12.5000 mg | Freq: Four times a day (QID) | INTRAMUSCULAR | Status: DC | PRN

## 2021-07-26 MED ORDER — SODIUM CHLORIDE (PF) 0.9 % IJ SOLN
INTRAMUSCULAR | Status: AC
  Filled 2021-07-26: qty 20

## 2021-07-26 MED ORDER — LACTATED RINGERS IV SOLN: INTRAVENOUS | Status: DC

## 2021-07-26 MED ORDER — BUPIVACAINE LIPOSOME 1.3 % IJ SUSP
INTRAMUSCULAR | Status: DC | PRN
  Administered 2021-07-26: 20 mL

## 2021-07-26 MED ORDER — FENTANYL CITRATE PF 50 MCG/ML IJ SOSY
25.0000 ug | PREFILLED_SYRINGE | INTRAMUSCULAR | Status: DC | PRN
  Administered 2021-07-26 (×3): 50 ug via INTRAVENOUS

## 2021-07-26 MED ORDER — CELECOXIB 200 MG PO CAPS
200.0000 mg | ORAL_CAPSULE | Freq: Once | ORAL | Status: AC
  Administered 2021-07-26: 200 mg via ORAL
  Filled 2021-07-26: qty 1

## 2021-07-26 MED ORDER — LIDOCAINE HCL (CARDIAC) PF 100 MG/5ML IV SOSY
PREFILLED_SYRINGE | INTRAVENOUS | Status: DC | PRN
  Administered 2021-07-26: 60 mg via INTRAVENOUS

## 2021-07-26 MED ORDER — ONDANSETRON HCL 4 MG/2ML IJ SOLN: 4.0000 mg | Freq: Once | INTRAMUSCULAR | Status: DC | PRN

## 2021-07-26 MED ORDER — STERILE WATER FOR IRRIGATION IR SOLN
Status: DC | PRN
  Administered 2021-07-26: 1000 mL

## 2021-07-26 MED ORDER — DOCUSATE SODIUM 100 MG PO CAPS
100.0000 mg | ORAL_CAPSULE | Freq: Two times a day (BID) | ORAL | Status: DC
  Administered 2021-07-26 – 2021-07-28 (×4): 100 mg via ORAL
  Filled 2021-07-26 (×4): qty 1

## 2021-07-26 MED ORDER — ORAL CARE MOUTH RINSE: 15.0000 mL | Freq: Once | OROMUCOSAL | Status: AC

## 2021-07-26 MED ORDER — CEFAZOLIN SODIUM-DEXTROSE 2-4 GM/100ML-% IV SOLN
2.0000 g | INTRAVENOUS | Status: AC
  Administered 2021-07-26: 2 g via INTRAVENOUS
  Filled 2021-07-26: qty 100

## 2021-07-26 MED ORDER — DEXAMETHASONE SODIUM PHOSPHATE 10 MG/ML IJ SOLN
INTRAMUSCULAR | Status: AC
  Filled 2021-07-26: qty 1

## 2021-07-26 MED ORDER — KETAMINE HCL 10 MG/ML IJ SOLN
INTRAMUSCULAR | Status: DC | PRN
  Administered 2021-07-26: 10 mg via INTRAVENOUS
  Administered 2021-07-26 (×2): 20 mg via INTRAVENOUS

## 2021-07-26 MED ORDER — SODIUM CHLORIDE (PF) 0.9 % IJ SOLN
INTRAMUSCULAR | Status: DC | PRN
  Administered 2021-07-26: 20 mL

## 2021-07-26 MED ORDER — DEXTROSE-NACL 5-0.45 % IV SOLN: INTRAVENOUS | Status: DC

## 2021-07-26 MED ORDER — ONDANSETRON HCL 4 MG/2ML IJ SOLN: 4.0000 mg | INTRAMUSCULAR | Status: DC | PRN

## 2021-07-26 MED ORDER — ACETAMINOPHEN 500 MG PO TABS
1000.0000 mg | ORAL_TABLET | Freq: Once | ORAL | Status: AC
  Administered 2021-07-26: 1000 mg via ORAL
  Filled 2021-07-26: qty 2

## 2021-07-26 MED ORDER — ACETAMINOPHEN 325 MG PO TABS: 325.0000 mg | ORAL_TABLET | ORAL | Status: DC | PRN

## 2021-07-26 MED ORDER — DIPHENHYDRAMINE HCL 12.5 MG/5ML PO ELIX: 12.5000 mg | ORAL_SOLUTION | Freq: Four times a day (QID) | ORAL | Status: DC | PRN

## 2021-07-26 MED ORDER — DEXAMETHASONE SODIUM PHOSPHATE 4 MG/ML IJ SOLN
INTRAMUSCULAR | Status: DC | PRN
  Administered 2021-07-26: 10 mg via INTRAVENOUS

## 2021-07-26 MED ORDER — HYDROMORPHONE HCL 1 MG/ML IJ SOLN
0.5000 mg | INTRAMUSCULAR | Status: DC | PRN
  Filled 2021-07-26: qty 1

## 2021-07-26 MED ORDER — DOCUSATE SODIUM 100 MG PO CAPS: 100.0000 mg | ORAL_CAPSULE | Freq: Two times a day (BID) | ORAL | Status: DC

## 2021-07-26 MED ORDER — ACETAMINOPHEN 500 MG PO TABS
1000.0000 mg | ORAL_TABLET | Freq: Four times a day (QID) | ORAL | Status: AC
  Administered 2021-07-26 – 2021-07-27 (×2): 1000 mg via ORAL
  Filled 2021-07-26 (×2): qty 2

## 2021-07-26 MED ORDER — ONDANSETRON HCL 4 MG/2ML IJ SOLN
INTRAMUSCULAR | Status: AC
  Filled 2021-07-26: qty 2

## 2021-07-26 MED ORDER — MEPERIDINE HCL 50 MG/ML IJ SOLN: 6.2500 mg | INTRAMUSCULAR | Status: DC | PRN

## 2021-07-26 MED ORDER — OXYCODONE HCL 5 MG PO TABS: 5.0000 mg | ORAL_TABLET | Freq: Once | ORAL | Status: DC | PRN

## 2021-07-26 MED ORDER — OXYCODONE HCL 5 MG PO TABS
5.0000 mg | ORAL_TABLET | ORAL | Status: DC | PRN
  Administered 2021-07-26 – 2021-07-28 (×6): 5 mg via ORAL
  Filled 2021-07-26 (×6): qty 1

## 2021-07-26 MED ORDER — PROPOFOL 10 MG/ML IV BOLUS
INTRAVENOUS | Status: DC | PRN
  Administered 2021-07-26: 200 mg via INTRAVENOUS

## 2021-07-26 MED ORDER — DEXMEDETOMIDINE (PRECEDEX) IN NS 20 MCG/5ML (4 MCG/ML) IV SYRINGE
PREFILLED_SYRINGE | INTRAVENOUS | Status: DC | PRN
  Administered 2021-07-26: 8 ug via INTRAVENOUS
  Administered 2021-07-26: 12 ug via INTRAVENOUS

## 2021-07-26 MED ORDER — FENTANYL CITRATE (PF) 100 MCG/2ML IJ SOLN
INTRAMUSCULAR | Status: DC | PRN
  Administered 2021-07-26 (×2): 100 ug via INTRAVENOUS
  Administered 2021-07-26: 50 ug via INTRAVENOUS

## 2021-07-26 MED ORDER — KETAMINE HCL 50 MG/5ML IJ SOSY
PREFILLED_SYRINGE | INTRAMUSCULAR | Status: AC
  Filled 2021-07-26: qty 5

## 2021-07-26 MED ORDER — LIDOCAINE HCL (PF) 2 % IJ SOLN
INTRAMUSCULAR | Status: AC
  Filled 2021-07-26: qty 5

## 2021-07-26 MED ORDER — FENTANYL CITRATE PF 50 MCG/ML IJ SOSY
PREFILLED_SYRINGE | INTRAMUSCULAR | Status: AC
  Filled 2021-07-26: qty 3

## 2021-07-26 MED ORDER — HYDROMORPHONE HCL 1 MG/ML IJ SOLN
0.2500 mg | INTRAMUSCULAR | Status: DC | PRN
  Administered 2021-07-26 (×4): 0.5 mg via INTRAVENOUS

## 2021-07-26 MED ORDER — GABAPENTIN 300 MG PO CAPS
300.0000 mg | ORAL_CAPSULE | Freq: Every day | ORAL | Status: DC
  Administered 2021-07-26 – 2021-07-27 (×2): 300 mg via ORAL
  Filled 2021-07-26 (×2): qty 1

## 2021-07-26 MED ORDER — LACTATED RINGERS IV SOLN: INTRAVENOUS | Status: DC | PRN

## 2021-07-26 MED ORDER — HYOSCYAMINE SULFATE 0.125 MG SL SUBL
0.1250 mg | SUBLINGUAL_TABLET | SUBLINGUAL | Status: DC | PRN
  Filled 2021-07-26: qty 1

## 2021-07-26 MED ORDER — MIDAZOLAM HCL 5 MG/5ML IJ SOLN
INTRAMUSCULAR | Status: DC | PRN
  Administered 2021-07-26: 2 mg via INTRAVENOUS

## 2021-07-26 MED ORDER — ROCURONIUM BROMIDE 10 MG/ML (PF) SYRINGE
PREFILLED_SYRINGE | INTRAVENOUS | Status: AC
  Filled 2021-07-26: qty 20

## 2021-07-26 MED ORDER — SUGAMMADEX SODIUM 200 MG/2ML IV SOLN
INTRAVENOUS | Status: DC | PRN
  Administered 2021-07-26: 200 mg via INTRAVENOUS

## 2021-07-26 MED ORDER — ONDANSETRON HCL 4 MG/2ML IJ SOLN
INTRAMUSCULAR | Status: DC | PRN
  Administered 2021-07-26: 4 mg via INTRAVENOUS

## 2021-07-26 MED ORDER — BUPIVACAINE LIPOSOME 1.3 % IJ SUSP
INTRAMUSCULAR | Status: AC
  Filled 2021-07-26: qty 20

## 2021-07-26 MED ORDER — OXYCODONE HCL 5 MG/5ML PO SOLN: 5.0000 mg | Freq: Once | ORAL | Status: DC | PRN

## 2021-07-26 MED ORDER — CHLORHEXIDINE GLUCONATE 0.12 % MT SOLN
15.0000 mL | Freq: Once | OROMUCOSAL | Status: AC
  Administered 2021-07-26: 15 mL via OROMUCOSAL

## 2021-07-26 MED ORDER — ACETAMINOPHEN 160 MG/5ML PO SOLN: 325.0000 mg | ORAL | Status: DC | PRN

## 2021-07-26 MED ORDER — MIDAZOLAM HCL 2 MG/2ML IJ SOLN
INTRAMUSCULAR | Status: AC
  Filled 2021-07-26: qty 2

## 2021-07-26 MED ORDER — ROCURONIUM BROMIDE 10 MG/ML (PF) SYRINGE
PREFILLED_SYRINGE | INTRAVENOUS | Status: DC | PRN
  Administered 2021-07-26: 20 mg via INTRAVENOUS
  Administered 2021-07-26: 100 mg via INTRAVENOUS

## 2021-07-26 MED ORDER — OXYCODONE-ACETAMINOPHEN 5-325 MG PO TABS: 1.0000 | ORAL_TABLET | Freq: Four times a day (QID) | ORAL | 0 refills | Status: DC | PRN

## 2021-07-26 MED ORDER — HYDROMORPHONE HCL 1 MG/ML IJ SOLN
INTRAMUSCULAR | Status: AC
  Filled 2021-07-26: qty 1

## 2021-07-26 SURGICAL SUPPLY — 58 items
BAG COUNTER SPONGE SURGICOUNT (BAG) ×2 IMPLANT
BAG LAPAROSCOPIC 12 15 PORT 16 (BASKET) ×1 IMPLANT
BAG RETRIEVAL 12/15 (BASKET) ×2
CHLORAPREP W/TINT 26 (MISCELLANEOUS) ×2 IMPLANT
CLIP LIGATING HEM O LOK PURPLE (MISCELLANEOUS) ×2 IMPLANT
CLIP LIGATING HEMO LOK XL GOLD (MISCELLANEOUS) ×2 IMPLANT
CLIP LIGATING HEMO O LOK GREEN (MISCELLANEOUS) ×2 IMPLANT
COVER SURGICAL LIGHT HANDLE (MISCELLANEOUS) ×2 IMPLANT
COVER TIP SHEARS 8 DVNC (MISCELLANEOUS) ×1 IMPLANT
COVER TIP SHEARS 8MM DA VINCI (MISCELLANEOUS) ×1
CUTTER ECHEON FLEX ENDO 45 340 (ENDOMECHANICALS) ×1 IMPLANT
DERMABOND ADVANCED (GAUZE/BANDAGES/DRESSINGS) ×2
DERMABOND ADVANCED .7 DNX12 (GAUZE/BANDAGES/DRESSINGS) ×2 IMPLANT
DRAIN CHANNEL 15F RND FF 3/16 (WOUND CARE) IMPLANT
DRAPE ARM DVNC X/XI (DISPOSABLE) ×4 IMPLANT
DRAPE COLUMN DVNC XI (DISPOSABLE) ×1 IMPLANT
DRAPE DA VINCI XI ARM (DISPOSABLE) ×4
DRAPE DA VINCI XI COLUMN (DISPOSABLE) ×1
DRAPE INCISE IOBAN 66X45 STRL (DRAPES) ×2 IMPLANT
DRAPE SHEET LG 3/4 BI-LAMINATE (DRAPES) ×2 IMPLANT
ELECT PENCIL ROCKER SW 15FT (MISCELLANEOUS) ×2 IMPLANT
ELECT REM PT RETURN 15FT ADLT (MISCELLANEOUS) ×2 IMPLANT
EVACUATOR SILICONE 100CC (DRAIN) IMPLANT
GLOVE SURG ENC MOIS LTX SZ6.5 (GLOVE) ×2 IMPLANT
GLOVE SURG ENC TEXT LTX SZ7.5 (GLOVE) ×4 IMPLANT
HOLDER FOLEY CATH W/STRAP (MISCELLANEOUS) ×2 IMPLANT
IRRIG SUCT STRYKERFLOW 2 WTIP (MISCELLANEOUS) ×2
IRRIGATION SUCT STRKRFLW 2 WTP (MISCELLANEOUS) ×1 IMPLANT
KIT BASIN OR (CUSTOM PROCEDURE TRAY) ×2 IMPLANT
KIT TURNOVER KIT A (KITS) IMPLANT
LOOP VESSEL MAXI BLUE (MISCELLANEOUS) IMPLANT
NDL INSUFFLATION 14GA 120MM (NEEDLE) ×1 IMPLANT
NEEDLE INSUFFLATION 14GA 120MM (NEEDLE) ×2 IMPLANT
PENCIL SMOKE EVACUATOR (MISCELLANEOUS) IMPLANT
PORT ACCESS TROCAR AIRSEAL 12 (TROCAR) ×1 IMPLANT
PORT ACCESS TROCAR AIRSEAL 5M (TROCAR) ×1
PROTECTOR NERVE ULNAR (MISCELLANEOUS) ×4 IMPLANT
RELOAD STAPLE 45 2.6 WHT THIN (STAPLE) IMPLANT
SEAL CANN UNIV 5-8 DVNC XI (MISCELLANEOUS) ×4 IMPLANT
SEAL XI 5MM-8MM UNIVERSAL (MISCELLANEOUS) ×4
SET TRI-LUMEN FLTR TB AIRSEAL (TUBING) ×2 IMPLANT
SOLUTION ELECTROLUBE (MISCELLANEOUS) ×2 IMPLANT
SPIKE FLUID TRANSFER (MISCELLANEOUS) ×2 IMPLANT
SPONGE T-LAP 4X18 ~~LOC~~+RFID (SPONGE) ×2 IMPLANT
STAPLE RELOAD 45 WHT (STAPLE) ×4 IMPLANT
STAPLE RELOAD 45MM WHITE (STAPLE) ×4
SUT ETHILON 3 0 PS 1 (SUTURE) IMPLANT
SUT MNCRL AB 4-0 PS2 18 (SUTURE) ×4 IMPLANT
SUT PDS AB 1 CT1 27 (SUTURE) ×7 IMPLANT
SUT VICRYL 0 UR6 27IN ABS (SUTURE) IMPLANT
TOWEL OR 17X26 10 PK STRL BLUE (TOWEL DISPOSABLE) ×2 IMPLANT
TOWEL OR NON WOVEN STRL DISP B (DISPOSABLE) ×2 IMPLANT
TRAY FOLEY MTR SLVR 16FR STAT (SET/KITS/TRAYS/PACK) ×2 IMPLANT
TRAY LAPAROSCOPIC (CUSTOM PROCEDURE TRAY) ×2 IMPLANT
TROCAR BLADELESS OPT 5 100 (ENDOMECHANICALS) IMPLANT
TROCAR UNIVERSAL OPT 12M 100M (ENDOMECHANICALS) ×2 IMPLANT
TROCAR XCEL 12X100 BLDLESS (ENDOMECHANICALS) ×2 IMPLANT
WATER STERILE IRR 1000ML POUR (IV SOLUTION) ×2 IMPLANT

## 2021-07-26 NOTE — Anesthesia Procedure Notes (Signed)
Procedure Name: Intubation ?Date/Time: 07/26/2021 11:37 AM ?Performed by: Lieutenant Diego, CRNA ?Pre-anesthesia Checklist: Patient identified, Emergency Drugs available, Suction available and Patient being monitored ?Patient Re-evaluated:Patient Re-evaluated prior to induction ?Oxygen Delivery Method: Circle system utilized ?Preoxygenation: Pre-oxygenation with 100% oxygen ?Induction Type: IV induction ?Ventilation: Mask ventilation without difficulty ?Laryngoscope Size: Sabra Heck and 2 ?Grade View: Grade I ?Tube type: Oral ?Tube size: 7.5 mm ?Number of attempts: 1 ?Airway Equipment and Method: Stylet and Oral airway ?Placement Confirmation: ETT inserted through vocal cords under direct vision, positive ETCO2 and breath sounds checked- equal and bilateral ?Secured at: 23 cm ?Tube secured with: Tape ?Dental Injury: Teeth and Oropharynx as per pre-operative assessment  ? ? ? ? ?

## 2021-07-26 NOTE — H&P (Signed)
Mitchell Hughes is an 56 y.o. male.   ? ?Chief Complaint: Pre-Op LEFT Robotic Radical Nephrectomy ? ?HPI:  ? ?1 - Large RIGHT Renal Stone with Focal Obstruction - s/p Rt PCNL 05/2021 for 2.6cm lower mid infundibular stone with lower pole focal hydro.  ? ?2- Large LEFT Renal Mass - 6cm heterogenous, enhancing mid posterior mass incidental on CT 04/2021. 2 artery (lower acessory) / 1 vein left renovascular anatomy.  ? ?PMH sig for foot surgery. No ischemic CV disease / blood thinners. He works at [employer] His PCP is Mitchell Rout MD.  ? ? ?Today "Mitchell Hughes" is seen to proceed with LEFT robotic radical nephrectomy. Interval CT stone free with some local progression of left mass, no hilar adenopathy or overt distant disease. Hgb 15, Cr 1.1. Most recent UCX negative.  ? ? ?Past Medical History:  ?Diagnosis Date  ? Arthritis   ? Erectile dysfunction   ? Family history of colon cancer   ? sister in 37s  ? Family history of heart disease 2011  ? baseline cardiac eval 2011 with Dr. Sallyanne Hughes  ? H/O echocardiogram 2015  ? Dr. Wynonia Hughes  ? History of exercise stress test   ? 04/2014 Dr. Wynonia Hughes, prior 2011 normal Bruce treadmill stress test, Dr. Debara Pickett  ? History of kidney stones   ? LOW BACK PAIN, ACUTE 09/26/2009  ? RENAL CALCULUS, RIGHT 09/28/2009  ? 9.30m on CT  ? TOBACCO USE, QUIT 09/26/2009  ? Wears glasses   ? ? ?Past Surgical History:  ?Procedure Laterality Date  ? COLONOSCOPY  08/2017  ? tubular adenoma, repeat 5 years; Mitchell Hughes ? FOOT SURGERY    ? bone spur, right; Dr. HMilinda Hughes ? IR NEPHROSTOMY PLACEMENT RIGHT  04/14/2021  ? NEPHROLITHOTOMY Right 05/17/2021  ? Procedure: NEPHROLITHOTOMY PERCUTANEOUS insertion double j stent;  Surgeon: Mitchell Frock MD;  Location: [facility];  Service: Urology;  Laterality: Right;  ? WISDOM TOOTH EXTRACTION    ? WISDOM TOOTH EXTRACTION    ? ? ?Family History  ?Problem Relation Age of Onset  ? Thyroid disease Mother   ? Other Mother   ?     brain disease, possibly dementia?   ? Gallstones Mother   ? Other Father   ?     spinal cord injury/accident  ? Heart disease Father 743 ? Heart disease Sister 472 ?     artificial valve  ? Colon cancer Sister   ? Heart disease Brother 661 ?     MI  ? Diabetes Brother   ? Pancreatic cancer Brother   ? Cancer Sister 510 ?     colon  ? Cancer Sister   ?     pancreas  ? Pancreatic cancer Sister   ? Heart disease Sister   ? Heart disease Brother 56 ?     pacemaker  ? Cancer Brother   ?     liver, formerly pancreas  ? Heart disease Other   ?     parent, other relative  ? Colon cancer Other   ? Stroke Neg Hx   ? Hypertension Neg Hx   ? Rectal cancer Neg Hx   ? Stomach cancer Neg Hx   ? ?Social History:  reports that he quit smoking about 12 years ago. His smoking use included cigarettes. He has a 7.50 pack-year smoking history. He has never used smokeless tobacco. He reports current alcohol use of about 1.0 standard drink per  week. He reports that he does not use drugs. ? ?Allergies: No Known Allergies ? ?No medications prior to admission.  ? ? ?No results found for this or any previous visit (from the past 48 hour(s)). ?No results found. ? ?Review of Systems  ?Constitutional:  Negative for chills and fever.  ?All other systems reviewed and are negative. ? ?There were no vitals taken for this visit. ?Physical Exam ?Vitals reviewed.  ?HENT:  ?   Head: Normocephalic.  ?Eyes:  ?   Pupils: Pupils are equal, round, and reactive to light.  ?Cardiovascular:  ?   Rate and Rhythm: Normal rate.  ?Pulmonary:  ?   Effort: Pulmonary effort is normal.  ?Abdominal:  ?   General: Abdomen is flat.  ?Genitourinary: ?   Comments: Piror Rt flank scar well healed ?Musculoskeletal:     ?   General: Normal range of motion.  ?Skin: ?   General: Skin is warm.  ?Neurological:  ?   General: No focal deficit present.  ?   Mental Status: He is alert.  ?Psychiatric:     ?   Mood and Affect: Mood normal.  ?  ? ?Assessment/Plan ? ?Proceed as planned with LEFT robotic radical  nephrectomy. Risks, benefits, alternatives, expected peri-op course discussed previously and reiterated today.  ? ?Mitchell Frock, MD ?07/26/2021, 6:54 AM ? ? ? ?

## 2021-07-26 NOTE — Transfer of Care (Signed)
Immediate Anesthesia Transfer of Care Note ? ?Patient: Mitchell Hughes ? ?Procedure(s) Performed: XI ROBOTIC ASSISTED LAPAROSCOPIC NEPHRECTOMY (Left) ? ?Patient Location: PACU ? ?Anesthesia Type:General ? ?Level of Consciousness: awake and alert  ? ?Airway & Oxygen Therapy: Patient Spontanous Breathing and Patient connected to face mask oxygen ? ?Post-op Assessment: Report given to RN and Post -op Vital signs reviewed and stable ? ?Post vital signs: Reviewed and stable ? ?Last Vitals:  ?Vitals Value Taken Time  ?BP    ?Temp    ?Pulse    ?Resp    ?SpO2    ? ? ?Last Pain:  ?Vitals:  ? 07/26/21 0928  ?PainSc: 0-No pain  ?   ? ?Patients Stated Pain Goal: 4 (07/26/21 0981) ? ?Complications: No notable events documented. ?

## 2021-07-26 NOTE — Brief Op Note (Signed)
07/26/2021 ? ?3:18 PM ? ?PATIENT:  Mitchell Hughes  56 y.o. male ? ?PRE-OPERATIVE DIAGNOSIS:  LEFT RENAL MASS ? ?POST-OPERATIVE DIAGNOSIS:  LEFT RENAL MASS ? ?PROCEDURE:  Procedure(s) with comments: ?XI ROBOTIC ASSISTED LAPAROSCOPIC NEPHRECTOMY (Left) - 3 HRS ? ?SURGEON:  Surgeon(s) and Role: ?   Alexis Frock, MD - Primary ? ?PHYSICIAN ASSISTANT:  ? ?ASSISTANTS: Debbrah Alar PA  ? ?ANESTHESIA:   local and general ? ?EBL:  25 mL  ? ?BLOOD ADMINISTERED:none ? ?DRAINS:  foley to gravity   ? ?LOCAL MEDICATIONS USED:  MARCAINE    ? ?SPECIMEN:  Source of Specimen:  1 - Left kidney + peri-aortic lymph nodes; 2 - Additional periaortic lymph nodes ? ?DISPOSITION OF SPECIMEN:  PATHOLOGY ? ?COUNTS:  YES ? ?TOURNIQUET:  * No tourniquets in log * ? ?DICTATION: .Other Dictation: Dictation Number [id number] ? ?PLAN OF CARE: Admit to inpatient  ? ?PATIENT DISPOSITION:  PACU - hemodynamically stable. ?  ?Delay start of Pharmacological VTE agent (>24hrs) due to surgical blood loss or risk of bleeding: yes ? ?

## 2021-07-26 NOTE — Discharge Instructions (Addendum)

## 2021-07-26 NOTE — Anesthesia Postprocedure Evaluation (Signed)
Anesthesia Post Note ? ?Patient: CASPAR FAVILA ? ?Procedure(s) Performed: XI ROBOTIC ASSISTED LAPAROSCOPIC NEPHRECTOMY (Left) ? ?  ? ?Patient location during evaluation: PACU ?Anesthesia Type: General ?Level of consciousness: awake and alert ?Pain management: pain level controlled ?Vital Signs Assessment: post-procedure vital signs reviewed and stable ?Respiratory status: spontaneous breathing, nonlabored ventilation, respiratory function stable and patient connected to nasal cannula oxygen ?Cardiovascular status: blood pressure returned to baseline and stable ?Postop Assessment: no apparent nausea or vomiting ?Anesthetic complications: no ? ? ?No notable events documented. ? ?Last Vitals:  ?Vitals:  ? 07/26/21 1600 07/26/21 1624  ?BP: (!) 145/80 (!) 156/83  ?Pulse: 61 (!) 58  ?Resp: 10 18  ?Temp: 36.4 ?C 36.5 ?C  ?SpO2: 100% 100%  ?  ?Last Pain:  ?Vitals:  ? 07/26/21 1624  ?TempSrc: Oral  ?PainSc: 8   ? ? ?  ?  ?  ?  ?  ?  ? ?Midge Momon ? ? ? ? ?

## 2021-07-26 NOTE — Anesthesia Preprocedure Evaluation (Signed)
Anesthesia Evaluation  ?Patient identified by MRN, date of birth, ID band ?Patient awake ? ? ? ?Reviewed: ?Allergy & Precautions, NPO status , Patient's Chart, lab work & pertinent test results ? ?Airway ?Mallampati: II ? ?TM Distance: >3 FB ?Neck ROM: Full ? ? ? Dental ?no notable dental hx. ?(+) Teeth Intact, Dental Advisory Given, Caps,  ?  ?Pulmonary ?neg pulmonary ROS, former smoker,  ?  ?Pulmonary exam normal ?breath sounds clear to auscultation ? ? ? ? ? ? Cardiovascular ?Normal cardiovascular exam ?Rhythm:Regular Rate:Normal ? ? ?  ?Neuro/Psych ?negative psych ROS  ? GI/Hepatic ?  ?Endo/Other  ?negative endocrine ROS ? Renal/GU ?Renal disease (nephrolithiasis)Lab Results ?     Component                Value               Date                 ?     CREATININE               1.13                04/17/2021           ?     NA                       136                 04/17/2021           ?     K                        3.6                 04/17/2021           ?         ?  ?negative genitourinary ?  ?Musculoskeletal ? ?(+) Arthritis ,  ? Abdominal ?  ?Peds ?negative pediatric ROS ?(+)  Hematology ?negative hematology ROS ?(+) Lab Results ?     Component                Value               Date                       ?     HGB                      11.9 (L)            04/17/2021           ?     HCT                      36.5 (L)            04/17/2021           ?     PLT                      132 (L)             04/17/2021           ?   ?Anesthesia Other Findings ?NKA ? Reproductive/Obstetrics ?negative OB ROS ? ?  ? ? ? ? ? ? ? ? ? ? ? ? ? ?  ?  ? ? ? ? ? ? ? ? ?  Anesthesia Physical ? ?Anesthesia Plan ? ?ASA: 2 ? ?Anesthesia Plan: General  ? ?Post-op Pain Management: Tylenol PO (pre-op)  ? ?Induction: Intravenous ? ?PONV Risk Score and Plan: 3 and Treatment may vary due to age or medical condition, Midazolam, Ondansetron and Dexamethasone ? ?Airway Management Planned: Oral  ETT ? ?Additional Equipment: None ? ?Intra-op Plan:  ? ?Post-operative Plan: Extubation in OR ? ?Informed Consent: I have reviewed the patients History and Physical, chart, labs and discussed the procedure including the risks, benefits and alternatives for the proposed anesthesia with the patient or authorized representative who has indicated his/her understanding and acceptance.  ? ? ? ?Dental advisory given ? ?Plan Discussed with: CRNA and Anesthesiologist ? ?Anesthesia Plan Comments:   ? ? ? ? ? ? ?Anesthesia Quick Evaluation ? ?

## 2021-07-27 ENCOUNTER — Other Ambulatory Visit: Payer: Self-pay

## 2021-07-27 ENCOUNTER — Encounter (HOSPITAL_COMMUNITY): Payer: Self-pay | Admitting: Urology

## 2021-07-27 LAB — BASIC METABOLIC PANEL
Anion gap: 7 (ref 5–15)
BUN: 18 mg/dL (ref 6–20)
CO2: 23 mmol/L (ref 22–32)
Calcium: 9.2 mg/dL (ref 8.9–10.3)
Chloride: 106 mmol/L (ref 98–111)
Creatinine, Ser: 1.67 mg/dL — ABNORMAL HIGH (ref 0.61–1.24)
GFR, Estimated: 47 mL/min — ABNORMAL LOW (ref >60)
Glucose, Bld: 140 mg/dL — ABNORMAL HIGH (ref 70–99)
Potassium: 4.2 mmol/L (ref 3.5–5.1)
Sodium: 136 mmol/L (ref 135–145)

## 2021-07-27 LAB — HEMOGLOBIN AND HEMATOCRIT, BLOOD
HCT: 44.2 % (ref 39.0–52.0)
Hemoglobin: 15.3 g/dL (ref 13.0–17.0)

## 2021-07-27 MED ORDER — CHLORHEXIDINE GLUCONATE CLOTH 2 % EX PADS
6.0000 | MEDICATED_PAD | Freq: Every day | CUTANEOUS | Status: DC
  Administered 2021-07-27: 6 via TOPICAL

## 2021-07-27 NOTE — Progress Notes (Signed)
Urology Inpatient Progress Report ? ?Renal mass [N28.89] ? ?Procedure(s): ?XI ROBOTIC ASSISTED LAPAROSCOPIC NEPHRECTOMY ? ?1 Day Post-Op ? ? ?Intv/Subj: ?No acute events overnight. ?Patient is without complaint. ?Passing flatus, hungry.  Pain is well controlled.  Ambulating well. ? ?Principal Problem: ?  Renal mass ? ?Current Facility-Administered Medications  ?Medication Dose Route Frequency Provider Last Rate Last Admin  ? acetaminophen (TYLENOL) tablet 1,000 mg  1,000 mg Oral Q6H Dancy, Amanda, PA-C   1,000 mg at 07/26/21 1856  ? Chlorhexidine Gluconate Cloth 2 % PADS 6 each  6 each Topical Daily Alexis Frock, MD      ? diphenhydrAMINE (BENADRYL) injection 12.5-25 mg  12.5-25 mg Intravenous Q6H PRN Debbrah Alar, PA-C      ? Or  ? diphenhydrAMINE (BENADRYL) 12.5 MG/5ML elixir 12.5-25 mg  12.5-25 mg Oral Q6H PRN Debbrah Alar, PA-C      ? docusate sodium (COLACE) capsule 100 mg  100 mg Oral BID Debbrah Alar, PA-C   100 mg at 07/27/21 8413  ? gabapentin (NEURONTIN) capsule 300 mg  300 mg Oral QHS Debbrah Alar, PA-C   300 mg at 07/26/21 2129  ? HYDROmorphone (DILAUDID) injection 0.5-1 mg  0.5-1 mg Intravenous Q2H PRN Debbrah Alar, PA-C      ? hyoscyamine (LEVSIN SL) SL tablet 0.125 mg  0.125 mg Sublingual Q4H PRN Dancy, Amanda, PA-C      ? neomycin-bacitracin-polymyxin (NEOSPORIN) ointment packet 1 application.  1 application. Topical TID PRN Debbrah Alar, PA-C      ? ondansetron (ZOFRAN) injection 4 mg  4 mg Intravenous Q4H PRN Debbrah Alar, PA-C      ? oxyCODONE (Oxy IR/ROXICODONE) immediate release tablet 5 mg  5 mg Oral Q4H PRN Debbrah Alar, PA-C   5 mg at 07/27/21 0406  ? ?Facility-Administered Medications Ordered in Other Encounters  ?Medication Dose Route Frequency Provider Last Rate Last Admin  ? polyethylene glycol powder (GLYCOLAX/MIRALAX) container 255 g  1 Container Oral Once Alexis Frock, MD      ? ? ? ?Objective: ?Vital: ?Vitals:  ? 07/27/21 0009 07/27/21 0440 07/27/21 0758 07/27/21 1217   ?BP: 123/73 111/69 117/71 124/67  ?Pulse: 66 (!) 56 (!) 56 (!) 57  ?Resp: '18 18 20 20  '$ ?Temp: 97.7 ?F (36.5 ?C) 98.2 ?F (36.8 ?C) 97.8 ?F (36.6 ?C) 98.1 ?F (36.7 ?C)  ?TempSrc: Oral Oral Oral Oral  ?SpO2: 94% 96% 98% 99%  ?Weight:      ?Height:      ? ?I/Os: ?I/O last 3 completed shifts: ?In: 3289.7 [P.O.:480; I.V.:2709.7; IV Piggyback:100] ?Out: 1175 [Urine:1150; Blood:25] ? ?Physical Exam:  ?General: Patient is in no apparent distress ?Lungs: Normal respiratory effort, chest expands symmetrically. ?GI: Incisions are c/d/i. The abdomen is soft and nontender without mass. ?Foley: Draining clear yellow urine ?Ext: lower extremities symmetric ? ?Lab Results: ?Recent Labs  ?  07/26/21 ?1527 07/27/21 ?0344  ?HGB 15.2 15.3  ?HCT 44.7 44.2  ? ?Recent Labs  ?  07/27/21 ?0344  ?NA 136  ?K 4.2  ?CL 106  ?CO2 23  ?GLUCOSE 140*  ?BUN 18  ?CREATININE 1.67*  ?CALCIUM 9.2  ? ?No results for input(s): LABPT, INR in the last 72 hours. ?No results for input(s): LABURIN in the last 72 hours. ?Results for orders placed or performed during the hospital encounter of 05/15/21  ?SARS CORONAVIRUS 2 (TAT 6-24 HRS) Nasopharyngeal Nasopharyngeal Swab     Status: None  ? Collection Time: 05/15/21 12:14 PM  ? Specimen: Nasopharyngeal Swab  ?Result  Value Ref Range Status  ? SARS Coronavirus 2 NEGATIVE NEGATIVE Final  ?  Comment: (NOTE) ?SARS-CoV-2 target nucleic acids are NOT DETECTED. ? ?The SARS-CoV-2 RNA is generally detectable in upper and lower ?respiratory specimens during the acute phase of infection. Negative ?results do not preclude SARS-CoV-2 infection, do not rule out ?co-infections with other pathogens, and should not be used as the ?sole basis for treatment or other patient management decisions. ?Negative results must be combined with clinical observations, ?patient history, and epidemiological information. The expected ?result is Negative. ? ?Fact Sheet for Patients: ?SugarRoll.be ? ?Fact Sheet for  Healthcare Providers: ?https://www.woods-mathews.com/ ? ?This test is not yet approved or cleared by the [place] FDA and  ?has been authorized for detection and/or diagnosis of SARS-CoV-2 by ?FDA under an Emergency Use Authorization (EUA). This EUA will remain  ?in effect (meaning this test can be used) for the duration of the ?COVID-19 declaration under Se ction 564(b)(1) of the Act, 21 U.S.C. ?section 360bbb-3(b)(1), unless the authorization is terminated or ?revoked sooner. ? ?Performed at [facility], [place] [address] [place], [place] ?[address] ?  ? ? ?Studies/Results: ?No results found. ? ?Assessment: ?Left renal mass ? ?Procedure(s): ?XI ROBOTIC ASSISTED LAPAROSCOPIC NEPHRECTOMY, 1 Day Post-Op  doing well. ? ?Plan: ?Advance to general diet ?Discontinue IV fluids ?Ambulate ?Discontinue Foley catheter ?Likely discharge this afternoon after voiding ? ? ?Link Snuffer, MD ?Urology ?07/27/2021, 12:19 PM ? ?

## 2021-07-27 NOTE — Plan of Care (Signed)
?  Problem: Clinical Measurements: ?Goal: Ability to maintain clinical measurements within normal limits will improve ?Outcome: Adequate for Discharge ?Goal: Postoperative complications will be avoided or minimized ?Outcome: Adequate for Discharge ?  ?Problem: Health Behavior/Discharge Planning: ?Goal: Ability to manage health-related needs will improve ?Outcome: Adequate for Discharge ?  ?Problem: Clinical Measurements: ?Goal: Ability to maintain clinical measurements within normal limits will improve ?Outcome: Adequate for Discharge ?Goal: Will remain free from infection ?Outcome: Adequate for Discharge ?Goal: Diagnostic test results will improve ?Outcome: Adequate for Discharge ?  ?Problem: Nutrition: ?Goal: Adequate nutrition will be maintained ?Outcome: Adequate for Discharge ?  ?Problem: Coping: ?Goal: Level of anxiety will decrease ?Outcome: Adequate for Discharge ?  ?Problem: Elimination: ?Goal: Will not experience complications related to bowel motility ?Outcome: Adequate for Discharge ?Goal: Will not experience complications related to urinary retention ?Outcome: Adequate for Discharge ?  ?Problem: Pain Managment: ?Goal: General experience of comfort will improve ?Outcome: Adequate for Discharge ?  ?Problem: Safety: ?Goal: Ability to remain free from injury will improve ?Outcome: Adequate for Discharge ?  ?Problem: Skin Integrity: ?Goal: Risk for impaired skin integrity will decrease ?Outcome: Adequate for Discharge ?  ?

## 2021-07-27 NOTE — Progress Notes (Signed)
Patient ambulated X 2 with walker and RN in hallway this shift.  Tolerated well. ?

## 2021-07-27 NOTE — Op Note (Signed)
NAMESOANE, WILK MEDICAL RECORD NO: [id number] ACCOUNT NO: [id number] DATE OF BIRTH: [birth year] FACILITY: [facility] LOCATION: [id number] PHYSICIAN: Sebastian Ache, MD  Operative Report   PREOPERATIVE DIAGNOSES:  Large left renal mass, questionable retroperitoneal lymphadenopathy.  PROCEDURE: 1.  Robotic-assisted laparoscopic left radical nephrectomy. 2.  Retroperitoneal lymph node dissection.  ESTIMATED BLOOD LOSS:  50 mL  COMPLICATIONS:  None.  SPECIMENS: 1.  Left kidney with periaortic lymph nodes en bloc. 2.  Additional periaortic lymph nodes.  FINDINGS:  1.  Very large left kidney with significant desmoplasia in the area of the hilum and posteriorly towards the psoas musculature as anticipated. 2.  Borderline hilar adenopathy. 3.  Two artery, one vein left renal vascular anatomy as anticipated.  INDICATIONS:  The patient is a very pleasant 56 year old man who was found initially on workup of urosepsis to have a large right infundibular stone and incidentally a significant left renal mass.  He underwent right nephrostomy tube as a temporizing  measure in the setting of urosepsis followed by percutaneous nephrostolithotomy on the right side to gently advance his stone.  The order was chosen as to optimize his right kidney as given his left mass, his right kidney will likely become solitary.  He  underwent a restaging imaging which corroborated stone free status on the right and excellent renal healing with some continued progression of his left-sided renal mass.  Options were then discussed including recommended path of proceeding with left radical nephrectomy  with retroperitoneal lymph node dissection. Given some question of hilar adenopathy, he wished to proceed.  He presents for this today.  Informed consent was obtained and placed in medical record.  PROCEDURE IN DETAIL:  The patient was verified, procedure being left robotic radical nephrectomy with retroperitoneal lymph node  dissection was confirmed.  Procedure timeout was performed.  Intravenous antibiotics were administered.  General endotracheal  anesthesia induced.  The patient was placed into a left side up full flank position, then pulling 15 degrees of table flexion, superior arm elevator, axillary roll, sequential compression devices, bottom leg bent and top leg straight.  He was further  fastened to operating table using 3-inch tape over foam padding across supraxiphoid chest and his pelvis.  Beanbag was deployed.  Axillary roll was used and superior arm elevator.  Foley catheter had been placed per urethra to straight drain.  Sterile  field was created, prepping and draping the patient's entire left flank and abdomen using chlorhexidine gluconate after clipper shaving.  Next, a high flow, low pressure pneumoperitoneum was obtained using Veress technique in the left lower quadrant,  having passed the aspiration and drop test.  An 8 mm robotic camera port was placed in position approximately 1 handbreadth superior lateral to the umbilicus.  Laparoscopic examination of the peritoneal cavity revealed no significant adhesions and no  visceral injury.  Additional ports were placed as follows:  Right paramedian inferior robotic port approximately 1 handbreadth superior to the pubic ramus, right far lateral 8 mm robotic port approximately 4 fingerbreadths superior lateral to the  anterior superior iliac spine, left subcostal 8 mm robotic port approximately 1 fingerbreadth below the costal margin and then two 12 mm assistant port sites in the midline, one approximately 3 fingerbreadths inferior to the plane of camera port and  another 3 fingerbreadths superior to the plane of camera port, both of these were in the supraumbilical location.  Robot was then docked and passed the electronic checks.  Initial attention was directed at development of the  left retroperitoneum.   Incision was made lateral to the ascending colon from the  area of the splenic flexure towards the area of the internal ring and the colon was carefully swept medially.  The anterior surface of Gerota's fascia was noted and this plane was continuously  developed, releasing the descending colon medially.  Next the plane between the anterior surface of Gerota's fascia and the inferior margin of the spleen and the pancreas was also developed allowing these structures to self-retract medially away from  Gerota's and lateral splenic attachments were taken down to again allow the spleen to medially rotate and self retract. The kidney and retroperitoneal area was quite large as anticipated given the patient's large stature. Lower pole of the kidney area  was identified, placed on gentle lateral traction.  Next, dissection proceeded medial to this.  Ureter and gonadal vessels were encountered and placed on gentle lateral traction.  Psoas musculature was identified.  Dissection proceeded within this  triangle towards the area of the renal hilum.  A purposeful plane was chosen directly on to the surface of the aorta, given the patient's borderline hilar adenopathy and this was continued. A lower pole accessory artery was identified, circumferentially  mobilized and controlled using extra large Hem-o-lok clip proximal, vascular stapler distal and another more dominant superior artery was noted and similarly controlled quite proximally using extra large Hem-o-lok clip proximal, vascular stapler distal,  resulted in excellent hemostatic control of the arterial supply. Given his significant desmoplasia in this plane, hopefully signifying reactive adenopathy and not metastatic, enlarged lymph node packet, at least 3 x 8 cm  nodal tissue padcket was developed within the plane and  very careful lymphostasis was achieved with cold clips. The left renal vein was then clearly mobilized, and the left renal vein was noted superiorly and gonadal vessels inferiorly and a plane was chosen  proximal to this and controlled using vascular  stapler resulting in excellent hemostatic control of the left renal vein.  Dissection then proceeded against directly on the aorta, thus resecting the adrenal gland superiorly to the superior aspect of the adrenal gland and superior adrenal attachments  were taken down as were the superior renal.  This completed the medial plane of dissection.  Lateral attachments were taken down with cautery scissors.  There were several areas of multiple parasitic vessels, mostly superiorly and medially.  The ureter  was doubly clipped and ligated as were the gonadal vessels.  This completely freed up the large left kidney plus retroperitoneal lymph node packet en bloc.  This was placed in an extra large EndoCatch bag for later retrieval.  The hilum was carefully  inspected and there appeared to be a residual area of nodal tissue versus ganglion in the area of the hilum in a slightly more medial location.  This was very carefully mobilized, lymphostasis achieved with cold clips, set aside labeled as additional  periaortic lymph node tissue. Hemostasis was excellent.  Sponge and needle counts were correct.  We achieved the goals of the extirpated portion of the procedure today.  Robot was then undocked.  Specimen was retrieved by extending the previous assistant  port sites in the midline and then extending the inferior most for an additional 2 cm and the very large left kidney plus retroperitoneal lymph node specimen was delivered and set aside for permanent pathology en bloc.  The abdomen was inspected via the  extraction site, and again hemostasis was excellent.  The omentum was pulled over this. Fascia  was reapproximated using figure-of-eight PDS x8 followed by reapproximation of Scarpa's with running Vicryl.  All incision sites were infiltrated with dilute  lipolyzed Marcaine and closed at the level of the skin using subcuticular Monocryl followed by Dermabond.  Procedure  was then terminated.  The patient tolerated the procedure well, no immediate perioperative complications.  The patient taken to  postanesthesia care in stable condition.  Plan for patient admission.  Please note first assistant, Harrie Foreman, was crucial for all portions of the surgery today.  She provided invaluable retraction, suctioning, specimen manipulation, vascular clipping, vascular stapling and general first assistance.   SHW D: 07/26/2021 3:29:32 pm T: 07/27/2021 1:45:00 am  JOB: [id number]/ [id number]

## 2021-07-28 NOTE — Plan of Care (Signed)
?  Problem: Clinical Measurements: ?Goal: Ability to maintain clinical measurements within normal limits will improve ?Outcome: Progressing ?Goal: Postoperative complications will be avoided or minimized ?Outcome: Progressing ?  ?Problem: Nutrition: ?Goal: Adequate nutrition will be maintained ?Outcome: Progressing ?  ?Problem: Pain Managment: ?Goal: General experience of comfort will improve ?Outcome: Progressing ?  ?

## 2021-07-28 NOTE — Discharge Summary (Signed)
Physician Discharge Summary  ?Patient ID: ?Mitchell Hughes ?MRN: [id number] ?DOB/AGE: 1966/01/25 56 y.o. ? ?Admit date: 07/26/2021 ?Discharge date: 07/28/2021 ? ?Admission Diagnoses: ? ?Discharge Diagnoses:  ?Principal Problem: ?  Renal mass ? ? ?Discharged Condition: good ? ?Hospital Course: Patient underwent a robotic assisted laparoscopic left radical nephrectomy with retroperitoneal lymph node dissection on 07/26/2021.  He tolerated the procedure well and was stable postoperative.  Following day, he was doing well and Foley catheter was removed.  Was unable to void until about 9 PM and therefore remained overnight for observation to ensure he continues to void well.  Penile edema improved and he was voiding the following day without difficulty.  He was tolerating good diet and having flatus.  No bowel movement but was stable for discharge. ? ?Consults: None ? ?Significant Diagnostic Studies: None ? ?Treatments: surgery: As above ? ?Discharge Exam: ?Blood pressure 117/82, pulse 64, temperature 98.3 ?F (36.8 ?C), temperature source Oral, resp. rate 17, height '6\' 5"'$  (1.956 m), weight 104.2 kg, SpO2 96 %. ?General appearance: alert no acute distress ?Abdomen soft, appropriately tender, incisions clean dry and intact ?Penile edema improving ? ?Disposition: Discharge disposition: 01-Home or Self Care ? ? ? ? ? ? ?Discharge Instructions   ? ? No wound care   Complete by: As directed ?  ? No wound care   Complete by: As directed ?  ? ?  ? ?Allergies as of 07/28/2021   ?No Known Allergies ?  ? ?  ?Medication List  ?  ? ?STOP taking these medications   ? ?ibuprofen 800 MG tablet ?Commonly known as: ADVIL ?  ?Melatonin 10 MG Caps ?  ?oxybutynin 5 MG tablet ?Commonly known as: DITROPAN ?  ?phenazopyridine 200 MG tablet ?Commonly known as: Pyridium ?  ?sulfamethoxazole-trimethoprim 800-160 MG tablet ?Commonly known as: BACTRIM DS ?  ?tamsulosin 0.4 MG Caps capsule ?Commonly known as: Flomax ?  ? ?  ? ?TAKE these medications    ? ?docusate sodium 100 MG capsule ?Commonly known as: COLACE ?Take 1 capsule (100 mg total) by mouth 2 (two) times daily. ?  ?gabapentin 300 MG capsule ?Commonly known as: NEURONTIN ?TAKE 1 CAPSULE(300 MG) BY MOUTH AT BEDTIME ?  ?oxyCODONE-acetaminophen 5-325 MG tablet ?Commonly known as: PERCOCET/ROXICET ?Take 1-2 tablets by mouth every 6 (six) hours as needed for severe pain or moderate pain. ?What changed:  ?how much to take ?reasons to take this ?  ?sildenafil 100 MG tablet ?Commonly known as: VIAGRA ?Take 1 tablet (100 mg total) by mouth daily as needed for erectile dysfunction. ?  ? ?  ? ? Follow-up Information   ? ? Alexis Frock, MD Follow up on 08/12/2021.   ?Specialty: Urology ?Why: at 9 AM for MD visit and pathology review. ?Contact information: ?[address] ?[place] [address] ?[phone] ? ? ?  ?  ? ?  ?  ? ?  ? ? ?Signed: ?Marton Redwood, III ?07/28/2021, 11:34 AM ? ? ?

## 2021-07-28 NOTE — Progress Notes (Signed)
AVS and discharge instructions reviewed w/ patient. Patient verbalized understanding and had no further questions. 

## 2021-07-31 LAB — SURGICAL PATHOLOGY

## 2021-08-01 ENCOUNTER — Ambulatory Visit (INDEPENDENT_AMBULATORY_CARE_PROVIDER_SITE_OTHER): Payer: BC Managed Care – PPO | Admitting: Medical

## 2021-08-01 VITALS — BP 120/70 | HR 72 | Temp 97.5°F | Wt 234.4 lb

## 2021-08-01 DIAGNOSIS — Z905 Acquired absence of kidney: Secondary | ICD-10-CM | POA: Diagnosis not present

## 2021-08-01 DIAGNOSIS — N4889 Other specified disorders of penis: Secondary | ICD-10-CM

## 2021-08-01 DIAGNOSIS — K4090 Unilateral inguinal hernia, without obstruction or gangrene, not specified as recurrent: Secondary | ICD-10-CM | POA: Diagnosis not present

## 2021-08-01 DIAGNOSIS — N2889 Other specified disorders of kidney and ureter: Secondary | ICD-10-CM | POA: Diagnosis not present

## 2021-08-01 DIAGNOSIS — R0683 Snoring: Secondary | ICD-10-CM

## 2021-08-01 DIAGNOSIS — G479 Sleep disorder, unspecified: Secondary | ICD-10-CM

## 2021-08-01 LAB — POCT URINALYSIS DIP (PROADVANTAGE DEVICE)
Bilirubin, UA: NEGATIVE
Blood, UA: NEGATIVE
Glucose, UA: NEGATIVE mg/dL
Ketones, POC UA: NEGATIVE mg/dL
Leukocytes, UA: NEGATIVE
Nitrite, UA: NEGATIVE
Protein Ur, POC: NEGATIVE mg/dL
Specific Gravity, Urine: 1.02
Urobilinogen, Ur: NEGATIVE
pH, UA: 6 (ref 5.0–8.0)

## 2021-08-01 NOTE — Progress Notes (Signed)
Subjective: ? Mitchell Hughes is a 57 y.o. male who presents for ?Chief Complaint  ?Patient presents with  ? Hospitalization Follow-up  ?  Hospital follow-up- had kidney removed.   ?   ?Here for follow-up.  He just had hospitalization. ? ?Hospitalized March 24 through July 28, 2021 due to renal mass. ? ?Hospital Course: Patient underwent a robotic assisted laparoscopic left radical nephrectomy with retroperitoneal lymph node dissection on 07/26/2021.  He tolerated the procedure well and was stable postoperative.  Following day, he was doing well and Foley catheter was removed.  Was unable to void until about 9 PM and therefore remained overnight for observation to ensure he continues to void well.  Penile edema improved and he was voiding the following day without difficulty.  He was tolerating good diet and having flatus.  No bowel movement but was stable for discharge. ?  ?Currently he reports being sore from surgery, pain, but overall doing ok.   Penis is still swollen, sore to touch.  He had talked to urology about this when in hospital, and they felt it was typical.   He is concerned as it doesn't look right.  No cloudy urine, no odorous urine.   No blood in urine. ? ?He has follow-up planned with urology next week.    ? ?No other aggravating or relieving factors.   ? ?No other c/o. ? ?The following portions of the patient's history were reviewed and updated as appropriate: allergies, current medications, past family history, past medical history, past social history, past surgical history and problem list. ? ?ROS ?Otherwise as in subjective above ? ? ? ?Objective: ?BP 120/70   Pulse 72   Temp (!) 97.5 ?F (36.4 ?C)   Wt 234 lb 6.4 oz (106.3 kg)   BMI 27.80 kg/m?  ? ?General appearance: alert, no distress, well developed, well nourished ?Abdomen: +bs, soft, somewhat distended, but not indurated, fresh surgical scars centrally, left upper and left lower abdomen, somewhat tender, no masses, no hepatomegaly, no  splenomegaly ?There is some generalized soft tissue swelling of the penis, no bruising or unusual coloration, there is asymmetrical swelling, some at the distal shaft of the penis and some at the base of the penis, there is some general swelling of the scrotum as well. ?Pulses: 2+ radial pulses, 2+ pedal pulses, normal cap refill ?Ext: no edema ? ? ?Assessment: ?Encounter Diagnoses  ?Name Primary?  ? Renal mass Yes  ? S/p nephrectomy   ? Penile swelling   ? Right inguinal hernia   ? Sleep disturbance   ? Snoring   ? ? ? ?Plan: ?I reviewed the hospital discharge summary.  Medications were reviewed and reconciled.  Medication discontinue with this hospitalization were ibuprofen, melatonin, oxybutynin, Pyridium, Bactrim and tamsulosin. ? ?I called [facility].  They did advise that the swelling of the penis was typical after this type of procedure.  Fortunately he has not any pain.  His urinalysis today does not suggest infection.  He will rest, use a towel under the scrotum to help with swelling, will recline at home.  No significant standing or walking other than just around the house for the next few days.  Follow-up with urology next week as planned. ? ?He brought up his right inguinal hernia.  He plans to pursue surgical repair later in the year ? ?He will reschedule sleep study in the next few months ? ?Mitchell Hughes was seen today for hospitalization follow-up. ? ?Diagnoses and all orders for this visit: ? ?  Renal mass ? ?S/p nephrectomy ? ?Penile swelling ? ?Right inguinal hernia ? ?Sleep disturbance ? ?Snoring ? ? ?Follow up: with urology next week as planned ? ?

## 2021-08-01 NOTE — Addendum Note (Signed)
Addended by: Minette Headland A on: 08/01/2021 02:28 PM ? ? Modules accepted: Orders ? ?

## 2021-08-12 DIAGNOSIS — Z905 Acquired absence of kidney: Secondary | ICD-10-CM | POA: Diagnosis not present

## 2021-08-30 ENCOUNTER — Other Ambulatory Visit: Payer: Self-pay | Admitting: Medical

## 2021-09-17 ENCOUNTER — Other Ambulatory Visit: Payer: Self-pay | Admitting: Medical

## 2021-09-25 DIAGNOSIS — N200 Calculus of kidney: Secondary | ICD-10-CM | POA: Diagnosis not present

## 2021-09-26 DIAGNOSIS — N200 Calculus of kidney: Secondary | ICD-10-CM | POA: Diagnosis not present

## 2021-10-01 ENCOUNTER — Ambulatory Visit (INDEPENDENT_AMBULATORY_CARE_PROVIDER_SITE_OTHER): Payer: BC Managed Care – PPO | Admitting: Medical

## 2021-10-01 VITALS — Temp 97.6°F | Wt 233.8 lb

## 2021-10-01 DIAGNOSIS — R0789 Other chest pain: Secondary | ICD-10-CM

## 2021-10-01 DIAGNOSIS — R011 Cardiac murmur, unspecified: Secondary | ICD-10-CM

## 2021-10-01 DIAGNOSIS — R079 Chest pain, unspecified: Secondary | ICD-10-CM

## 2021-10-01 DIAGNOSIS — Z8249 Family history of ischemic heart disease and other diseases of the circulatory system: Secondary | ICD-10-CM

## 2021-10-01 DIAGNOSIS — N529 Male erectile dysfunction, unspecified: Secondary | ICD-10-CM

## 2021-10-01 DIAGNOSIS — M791 Myalgia, unspecified site: Secondary | ICD-10-CM

## 2021-10-01 DIAGNOSIS — I350 Nonrheumatic aortic (valve) stenosis: Secondary | ICD-10-CM | POA: Diagnosis not present

## 2021-10-01 DIAGNOSIS — M25512 Pain in left shoulder: Secondary | ICD-10-CM

## 2021-10-01 DIAGNOSIS — G479 Sleep disorder, unspecified: Secondary | ICD-10-CM

## 2021-10-01 DIAGNOSIS — M25511 Pain in right shoulder: Secondary | ICD-10-CM

## 2021-10-01 DIAGNOSIS — K4020 Bilateral inguinal hernia, without obstruction or gangrene, not specified as recurrent: Secondary | ICD-10-CM

## 2021-10-01 DIAGNOSIS — G8929 Other chronic pain: Secondary | ICD-10-CM

## 2021-10-01 DIAGNOSIS — R200 Anesthesia of skin: Secondary | ICD-10-CM

## 2021-10-01 MED ORDER — SILDENAFIL CITRATE 100 MG PO TABS: ORAL_TABLET | ORAL | 5 refills | Status: DC

## 2021-10-01 NOTE — Progress Notes (Signed)
Subjective:  Mitchell Hughes is a 56 y.o. male who presents for Chief Complaint  Patient presents with   Chest Pain    Chest tightness and then pain will go down left arm and side. No sob, getting lightheaded and dizziness     Here for concerns.  Been having tightness in left upper lateral chest, sometimes right chest, left shoulder and down left chest wall and abdomen like a squeeze.  Symptoms present intermittent for the last 2 weeks or so .  sometimes gets dizziness unrelated to the chest tightness.   When he gets the chest tightness, denies associated SOB, left arm pain, numbness, lightheadedness.   Feels like muscles are getting tight and drawing in.  Just started with these symptoms in the last few weeks.    No recent exercise or strenuous activity.     No calve pain or calve swelling.    Asked him ongoing problems with sleep.  Before he had kidney stone surgery in March, he had to postpone referral to neurology where we are sending him for sleep evaluation. Uses some melatonin for sleep but doesn't keep him asleep  He has history of hand numbness that seems to be worse in recent weeks.  Also wants to get back on the schedule for general surgery for hernia repair.  This was also delayed because of his kidney stone issues within the last few months  No other aggravating or relieving factors.    No other c/o.  Past Medical History:  Diagnosis Date   Arthritis    Erectile dysfunction    Family history of colon cancer    sister in 29s   Family history of heart disease 2011   baseline cardiac eval 2011 with Dr. Sallyanne Kuster   H/O echocardiogram 2015   Dr. Wynonia Lawman   History of exercise stress test    04/2014 Dr. Wynonia Lawman, prior 2011 normal Bruce treadmill stress test, Dr. Debara Pickett   History of kidney stones    LOW BACK PAIN, ACUTE 09/26/2009   RENAL CALCULUS, RIGHT 09/28/2009   9.58m on CT   TOBACCO USE, QUIT 09/26/2009   Wears glasses    Current Outpatient Medications on File Prior to  Visit  Medication Sig Dispense Refill   gabapentin (NEURONTIN) 300 MG capsule TAKE 1 CAPSULE(300 MG) BY MOUTH AT BEDTIME 30 capsule 2   Current Facility-Administered Medications on File Prior to Visit  Medication Dose Route Frequency Provider Last Rate Last Admin   polyethylene glycol powder (GLYCOLAX/MIRALAX) container 255 g  1 Container Oral Once Mitchell Frock MD       Family History  Problem Relation Age of Onset   Thyroid disease Mother    Other Mother        brain disease, possibly dementia?   Gallstones Mother    Other Father        spinal cord injury/accident   Heart disease Father 758  Heart disease Sister 446      artificial valve   Colon cancer Sister    Heart disease Brother 629      MI   Diabetes Brother    Pancreatic cancer Brother    Cancer Sister 553      colon   Cancer Sister        pancreas   Pancreatic cancer Sister    Heart disease Sister    Heart disease Brother 563      pacemaker   Cancer Brother  liver, formerly pancreas   Heart disease Other        parent, other relative   Colon cancer Other    Stroke Neg Hx    Hypertension Neg Hx    Rectal cancer Neg Hx    Stomach cancer Neg Hx      The following portions of the patient's history were reviewed and updated as appropriate: allergies, current medications, past family history, past medical history, past social history, past surgical history and problem list.  ROS Otherwise as in subjective above  Objective: Temp 97.6 F (36.4 C)   Wt 233 lb 12.8 oz (106.1 kg)   BMI 27.72 kg/m   General appearance: alert, no distress, well developed, well nourished Neck: supple, no lymphadenopathy, no thyromegaly, no masses, no JVD or bruit Heart: RRR, normal S1, S2, no murmurs Lungs: CTA bilaterally, no wheezes, rhonchi, or rales Inguinal hernia bilateral reducible but somewhat tender to palpation Pulses: 2+ radial pulses, 2+ pedal pulses, normal cap refill Ext: no edema  EKG  reviewed   Assessment: Encounter Diagnoses  Name Primary?   Chest pain, unspecified type Yes   Myalgia    Chest tightness    Aortic valve stenosis, etiology of cardiac valve disease unspecified    Heart murmur    Sleep disturbance    Bilateral inguinal hernia without obstruction or gangrene, recurrence not specified    Bilateral hand numbness    Chronic pain of both shoulders    Erectile dysfunction, unspecified erectile dysfunction type    Family history of premature coronary heart disease      Plan: We discussed his current symptoms of chest pain shoulder pain chest wall pain with atypical symptoms.  He has no associated cardiac symptoms otherwise.  EKG unremarkable today.  Looking back he has an abnormal MRI of the left shoulder from February 2022 supraspinatus tendon tear, susp[ected labra tears, and spurring.  He has a history of chronic shoulder pain.  I suspect his current symptoms are more related to his shoulder issues.  However we discussed if any new cardiac symptoms such as chest pain with associated sweats, nausea, shortness of breath or left arm pain radiating, then call 911.  Basic metabolic lab today just to make sure no electrolyte abnormality causing muscle spasm  Consider follow-up with orthopedic given the ongoing shoulder pains  Referral back to cardiology given history of heart murmur, mild aortic stenosis, and family history of premature heart disease.  Although symptoms currently are not typical for cardiac origin, he is concerned about the overall symptoms and family history  Of note, we had prior referred to neurology and general surgery however he had sepsis and kidney stone emergency surgery back in March 2023 which interfered with his referrals.  We will get him back on the schedule to see general surgery for inguinal hernia with recent discomfort  We will get him back on the schedule to see neurology for sleep disturbance, possible sleep apnea, hand  numbness.   [name] was seen today for chest pain.  Diagnoses and all orders for this visit:  Chest pain, unspecified type -     EKG 12-Lead -     Basic metabolic panel -     Ambulatory referral to Cardiology  Myalgia -     Basic metabolic panel  Chest tightness -     Basic metabolic panel  Aortic valve stenosis, etiology of cardiac valve disease unspecified -     Ambulatory referral to Cardiology  Heart murmur  Sleep disturbance -     Ambulatory referral to Neurology  Bilateral inguinal hernia without obstruction or gangrene, recurrence not specified -     Ambulatory referral to General Surgery  Bilateral hand numbness -     Ambulatory referral to Neurology  Chronic pain of both shoulders  Erectile dysfunction, unspecified erectile dysfunction type  Family history of premature coronary heart disease -     Ambulatory referral to Cardiology  Other orders -     sildenafil (VIAGRA) 100 MG tablet; TAKE 1 TABLET(100 MG) BY MOUTH DAILY AS NEEDED FOR ERECTILE DYSFUNCTION   Follow up: pending labs, referrals

## 2021-10-02 ENCOUNTER — Other Ambulatory Visit: Payer: Self-pay | Admitting: Medical

## 2021-10-02 DIAGNOSIS — Z905 Acquired absence of kidney: Secondary | ICD-10-CM

## 2021-10-02 DIAGNOSIS — R7989 Other specified abnormal findings of blood chemistry: Secondary | ICD-10-CM

## 2021-10-02 LAB — BASIC METABOLIC PANEL
BUN/Creatinine Ratio: 13 (ref 9–20)
BUN: 23 mg/dL (ref 6–24)
CO2: 20 mmol/L (ref 20–29)
Calcium: 9.8 mg/dL (ref 8.7–10.2)
Chloride: 103 mmol/L (ref 96–106)
Creatinine, Ser: 1.76 mg/dL — ABNORMAL HIGH (ref 0.76–1.27)
Glucose: 98 mg/dL (ref 70–99)
Potassium: 4.7 mmol/L (ref 3.5–5.2)
Sodium: 139 mmol/L (ref 134–144)
eGFR: 45 mL/min/{1.73_m2} — ABNORMAL LOW (ref >59)

## 2021-10-02 MED ORDER — HYDROCODONE-ACETAMINOPHEN 5-325 MG PO TABS: 1.0000 | ORAL_TABLET | Freq: Two times a day (BID) | ORAL | 0 refills | Status: DC | PRN

## 2021-10-04 ENCOUNTER — Ambulatory Visit
Admission: RE | Admit: 2021-10-04 | Discharge: 2021-10-04 | Disposition: A | Discharge status: Home / Self Care | Payer: BC Managed Care – PPO | Source: Ambulatory Visit | Attending: Family Medicine | Admitting: Family Medicine

## 2021-10-04 VITALS — BP 119/74 | HR 85 | Temp 98.0°F | Resp 20

## 2021-10-04 DIAGNOSIS — N390 Urinary tract infection, site not specified: Secondary | ICD-10-CM

## 2021-10-04 DIAGNOSIS — N4889 Other specified disorders of penis: Secondary | ICD-10-CM | POA: Diagnosis not present

## 2021-10-04 DIAGNOSIS — B9689 Other specified bacterial agents as the cause of diseases classified elsewhere: Secondary | ICD-10-CM | POA: Diagnosis not present

## 2021-10-04 LAB — POCT URINALYSIS DIP (MANUAL ENTRY)
Bilirubin, UA: NEGATIVE
Glucose, UA: NEGATIVE mg/dL
Ketones, POC UA: NEGATIVE mg/dL
Nitrite, UA: NEGATIVE
Protein Ur, POC: NEGATIVE mg/dL
Spec Grav, UA: 1.025 (ref 1.010–1.025)
Urobilinogen, UA: 0.2 E.U./dL
pH, UA: 5.5 (ref 5.0–8.0)

## 2021-10-04 MED ORDER — CEPHALEXIN 500 MG PO CAPS: 500.0000 mg | ORAL_CAPSULE | Freq: Two times a day (BID) | ORAL | 0 refills | Status: DC

## 2021-10-04 NOTE — ED Provider Notes (Signed)
[facility]    CSN: [id number] Arrival date & time: 10/04/21  1635      History   Chief Complaint Chief Complaint  Patient presents with   Penile Discharge    Entered by patient    HPI Mitchell Hughes is a 56 y.o. male.   Presenting today with several day history of urethral irritation, bloody penile discharge, dysuria.  Denies fever, chills, abdominal pain, pelvic pain, nausea vomiting diarrhea.  Does have a new sexual partner and wants to be screened for STDs.  Also has a history of kidney stones status post 2 recent kidney surgeries due to this.  Not trying anything over-the-counter for symptoms.   Past Medical History:  Diagnosis Date   Arthritis    Erectile dysfunction    Family history of colon cancer    sister in 15s   Family history of heart disease 2011   baseline cardiac eval 2011 with Dr. Sallyanne Kuster   H/O echocardiogram 2015   Dr. Wynonia Lawman   History of exercise stress test    04/2014 Dr. Wynonia Lawman, prior 2011 normal Bruce treadmill stress test, Dr. Debara Pickett   History of kidney stones    LOW BACK PAIN, ACUTE 09/26/2009   RENAL CALCULUS, RIGHT 09/28/2009   9.21m on CT   TOBACCO USE, QUIT 09/26/2009   Wears glasses     Patient Active Problem List   Diagnosis Date Noted   Renal stone 05/17/2021   [place]19[place]   Kidney stone 04/19/2021   Renal mass 04/19/2021   Splenomegaly 04/19/2021   [place]   Acute lower UTI 04/14/2021   Bilateral hand numbness 02/14/2021   Localized swelling on right hand 02/14/2021   Sleep disturbance 02/14/2021   Snoring 02/14/2021   Erectile dysfunction 02/14/2021   Need for influenza vaccination 02/14/2021   Impingement syndrome of left shoulder 06/20/2020   Primary osteoarthritis of first carpometacarpal joint of left hand 06/20/2020   Encounter for health maintenance examination in adult 02/17/2020   Screening for prostate cancer 02/17/2020   History of COVID-19  02/17/2020   Toenail deformity 02/17/2020   Neuropathy of foot, right 02/17/2020   Chronic pain of both shoulders 04/22/2019   Decreased range of motion of shoulder 04/22/2019   Degenerative arthritis of thumb, left 04/22/2019   Right inguinal hernia 01/28/2019   Aortic valve calcification 01/28/2019   Vaccine counseling 06/22/2017   Family history of heart disease 12/21/2013   Family history of colon cancer 12/21/2013   Nephrolithiasis 09/28/2009    Past Surgical History:  Procedure Laterality Date   COLONOSCOPY  08/2017   tubular adenoma, repeat 5 years; Dr. SCarolina Cellar  FOOT SURGERY     bone spur, right; Dr. HMilinda Pointer  IR NEPHROSTOMY PLACEMENT RIGHT  04/14/2021   NEPHROLITHOTOMY Right 05/17/2021   Procedure: NEPHROLITHOTOMY PERCUTANEOUS insertion double j stent;  Surgeon: MAlexis Frock MD;  Location: [facility];  Service: Urology;  Laterality: Right;   ROBOT ASSISTED LAPAROSCOPIC NEPHRECTOMY Left 07/26/2021   Procedure: XI ROBOTIC ASSISTED LAPAROSCOPIC NEPHRECTOMY;  Surgeon: MAlexis Frock MD;  Location: [facility];  Service: Urology;  Laterality: Left;  3 HRS   WISDOM TOOTH EXTRACTION     WISDOM TOOTH EXTRACTION         Home Medications    Prior to Admission medications   Medication Sig Start Date End Date Taking? Authorizing Provider  cephALEXin (KEFLEX) 500 MG capsule Take 1 capsule (500 mg total) by mouth 2 (two)  times daily. 10/04/21  Yes Volney American, PA-C  gabapentin (NEURONTIN) 300 MG capsule TAKE 1 CAPSULE(300 MG) BY MOUTH AT BEDTIME 08/30/21   Tysinger, Camelia Eng, PA-C  HYDROcodone-acetaminophen (NORCO) 5-325 MG tablet Take 1 tablet by mouth 2 (two) times daily as needed. 10/02/21   Tysinger, Camelia Eng, PA-C  sildenafil (VIAGRA) 100 MG tablet TAKE 1 TABLET(100 MG) BY MOUTH DAILY AS NEEDED FOR ERECTILE DYSFUNCTION 10/01/21   Tysinger, Camelia Eng, PA-C    Family History Family History  Problem Relation Age of Onset   Thyroid disease Mother    Other Mother         brain disease, possibly dementia?   Gallstones Mother    Other Father        spinal cord injury/accident   Heart disease Father 38   Heart disease Sister 9       artificial valve   Colon cancer Sister    Heart disease Brother 59       MI   Diabetes Brother    Pancreatic cancer Brother    Cancer Sister 51       colon   Cancer Sister        pancreas   Pancreatic cancer Sister    Heart disease Sister    Heart disease Brother 51       pacemaker   Cancer Brother        liver, formerly pancreas   Heart disease Other        parent, other relative   Colon cancer Other    Stroke Neg Hx    Hypertension Neg Hx    Rectal cancer Neg Hx    Stomach cancer Neg Hx     Social History Social History   Tobacco Use   Smoking status: Former    Packs/day: 0.25    Years: 30.00    Pack years: 7.50    Types: Cigarettes    Quit date: 03/05/2009    Years since quitting: 12.5   Smokeless tobacco: Never   Tobacco comments:    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   Review of Systems Review of Systems Per HPI  Physical Exam Triage Vital Signs ED Triage Vitals  Enc Vitals Group     BP 10/04/21 1641 119/74     Pulse Rate 10/04/21 1641 85     Resp 10/04/21 1641 20     Temp 10/04/21 1641 98 F (36.7 C)     Temp Source 10/04/21 1641 Oral     SpO2 10/04/21 1641 96 %     Weight --      Height --      Head Circumference --      Peak Flow --      Pain Score 10/04/21 1643 4     Pain Loc --      Pain Edu? --      Excl. in [place]? --    No data found.  Updated Vital Signs BP 119/74 (BP Location: Right Arm)   Pulse 85   Temp 98 F (36.7 C) (Oral)   Resp 20   SpO2 96%   Visual  Acuity Right Eye Distance:   Left  Eye Distance:   Bilateral Distance:    Right Eye Near:   Left Eye Near:    Bilateral Near:     Physical Exam Vitals and nursing note reviewed.  Constitutional:      Appearance: Normal appearance.  HENT:     Head: Atraumatic.  Eyes:     Extraocular Movements: Extraocular movements intact.     Conjunctiva/sclera: Conjunctivae normal.  Cardiovascular:     Rate and Rhythm: Normal rate and regular rhythm.  Pulmonary:     Effort: Pulmonary effort is normal.     Breath sounds: Normal breath sounds.  Abdominal:     General: Bowel sounds are normal. There is no distension.     Palpations: Abdomen is soft.     Tenderness: There is no abdominal tenderness. There is no guarding.  Genitourinary:    Comments: GU exam deferred, self swab performed Musculoskeletal:        General: Normal range of motion.     Cervical back: Normal range of motion and neck supple.  Skin:    General: Skin is warm and dry.  Neurological:     General: No focal deficit present.     Mental Status: He is oriented to person, place, and time.  Psychiatric:        Mood and Affect: Mood normal.        Thought Content: Thought content normal.        Judgment: Judgment normal.     UC Treatments / Results  Labs (all labs ordered are listed, but only abnormal results are displayed) Labs Reviewed  POCT URINALYSIS DIP (MANUAL ENTRY) - Abnormal; Notable for the following components:      Result Value   Clarity, UA cloudy (*)    Blood, UA moderate (*)    Leukocytes, UA Large (3+) (*)    All other components within normal limits  URINE CULTURE  CYTOLOGY, (ORAL, ANAL, URETHRAL) ANCILLARY ONLY    EKG   Radiology No results found.  Procedures Procedures (including critical care time)  Medications Ordered in UC Medications - No data to display  Initial Impression / Assessment and Plan / UC Course  I have reviewed the triage vital signs and the nursing notes.  Pertinent labs & imaging results that were  available during my care of the patient were reviewed by me and considered in my medical decision making (see chart for details).     Large leuks, RBCs present in urinalysis today.  Urine culture and cytology swab pending.  Cover with Keflex while awaiting remainder of results.  Push fluids, return for worsening symptoms.  Final Clinical Impressions(s) / UC Diagnoses   Final diagnoses:  Acute lower UTI  Penile irritation   Discharge Instructions   None    ED Prescriptions     Medication Sig Dispense Auth. Provider   cephALEXin (KEFLEX) 500 MG capsule Take 1 capsule (500 mg total) by mouth 2 (two) times daily. 14 capsule Volney American, [place]      PDMP not reviewed this encounter.   Volney American, [place] 10/04/21 1718

## 2021-10-04 NOTE — ED Triage Notes (Signed)
Pt states he has had a burning sensation in his penis.  Pt states that he had kidney stones in January and he had them surgically removed  Pt states he had stains on sheet that looked like blood   Pt states that his penis feels like a scratchy feeling on the inside

## 2021-10-06 LAB — URINE CULTURE: Culture: NO GROWTH

## 2021-10-07 ENCOUNTER — Other Ambulatory Visit: Payer: Self-pay | Admitting: Medical

## 2021-10-07 ENCOUNTER — Encounter: Payer: Self-pay | Admitting: Internal Medicine

## 2021-10-07 ENCOUNTER — Other Ambulatory Visit: Payer: Self-pay | Admitting: Internal Medicine

## 2021-10-07 DIAGNOSIS — R200 Anesthesia of skin: Secondary | ICD-10-CM

## 2021-10-07 LAB — CYTOLOGY, (ORAL, ANAL, URETHRAL) ANCILLARY ONLY
Chlamydia: NEGATIVE
Comment: NEGATIVE
Comment: NEGATIVE
Comment: NORMAL
Neisseria Gonorrhea: POSITIVE — AB
Trichomonas: NEGATIVE

## 2021-10-08 ENCOUNTER — Ambulatory Visit
Admission: RE | Admit: 2021-10-08 | Discharge: 2021-10-08 | Disposition: A | Discharge status: Home / Self Care | Payer: BC Managed Care – PPO | Source: Ambulatory Visit | Attending: Nurse Practitioner | Admitting: Nurse Practitioner

## 2021-10-08 ENCOUNTER — Telehealth (HOSPITAL_COMMUNITY): Payer: Self-pay | Admitting: Emergency Medicine

## 2021-10-08 VITALS — BP 129/84 | HR 74 | Temp 97.4°F | Resp 20

## 2021-10-08 DIAGNOSIS — Z113 Encounter for screening for infections with a predominantly sexual mode of transmission: Secondary | ICD-10-CM | POA: Diagnosis not present

## 2021-10-08 DIAGNOSIS — A549 Gonococcal infection, unspecified: Secondary | ICD-10-CM | POA: Diagnosis not present

## 2021-10-08 MED ORDER — CEFTRIAXONE SODIUM 1 G IJ SOLR
500.0000 mg | Freq: Once | INTRAMUSCULAR | Status: AC
  Administered 2021-10-08: 500 mg via INTRAMUSCULAR

## 2021-10-08 NOTE — Telephone Encounter (Signed)
Per protocol, patient to d/c Keflex, will need treatment with IM Rocephin '500mg'$  for positive Gonorrhea.   Attempted to reach patient x 1, LVM Results seen on mychart [facility] notified

## 2021-10-08 NOTE — ED Triage Notes (Signed)
Pt states that he received his test results   Pt states he was told he had gonorrhea  Pt states he saw on his My Chart his results and was not able to speak with the nurse when she called him yesterday so he made an appointment to come to the Urgent Care today

## 2021-10-08 NOTE — Discharge Instructions (Addendum)
-   We have given you a shot of ceftriaxone 500 mg today to treat gonorrhea - We are also testing for HIV and syphilis and will let you know if these come back positive - Please wear condoms with every sexual encounter to prevent spread of STI

## 2021-10-08 NOTE — ED Provider Notes (Signed)
[facility]    CSN: [id number] Arrival date & time: 10/08/21  1550      History   Chief Complaint Chief Complaint  Patient presents with   Penile Discharge    Entered by patient    HPI Mitchell Hughes is a 56 y.o. male.   Patient presents today for treatment of gonorrhea.  He was seen in urgent care over the weekend and tested positive for gonorrhea.  Initially, he was having some bloody penile discharge, however this has now resolved according to his report.  He denies any fevers, swelling in his groin, burning with urination, nausea or vomiting.  He also denies any lesions, open sores, rashes, or skin concerns on his genitalia.    Past Medical History:  Diagnosis Date   Arthritis    Erectile dysfunction    Family history of colon cancer    sister in 75s   Family history of heart disease 2011   baseline cardiac eval 2011 with Dr. Sallyanne Kuster   H/O echocardiogram 2015   Dr. Wynonia Lawman   History of exercise stress test    04/2014 Dr. Wynonia Lawman, prior 2011 normal Bruce treadmill stress test, Dr. Debara Pickett   History of kidney stones    LOW BACK PAIN, ACUTE 09/26/2009   RENAL CALCULUS, RIGHT 09/28/2009   9.15m on CT   TOBACCO USE, QUIT 09/26/2009   Wears glasses     Patient Active Problem List   Diagnosis Date Noted   Renal stone 05/17/2021   Sepsis with acute organ dysfunction ([facility] 04/19/2021   Kidney stone 04/19/2021   Renal mass 04/19/2021   Splenomegaly 04/19/2021   Sepsis ([facility] 04/14/2021   Acute lower UTI 04/14/2021   Bilateral hand numbness 02/14/2021   Localized swelling on right hand 02/14/2021   Sleep disturbance 02/14/2021   Snoring 02/14/2021   Erectile dysfunction 02/14/2021   Need for influenza vaccination 02/14/2021   Impingement syndrome of left shoulder 06/20/2020   Primary osteoarthritis of first carpometacarpal joint of left hand 06/20/2020   Encounter for health maintenance examination in adult 02/17/2020   Screening for prostate cancer  02/17/2020   History of COVID-19 02/17/2020   Toenail deformity 02/17/2020   Neuropathy of foot, right 02/17/2020   Chronic pain of both shoulders 04/22/2019   Decreased range of motion of shoulder 04/22/2019   Degenerative arthritis of thumb, left 04/22/2019   Right inguinal hernia 01/28/2019   Aortic valve calcification 01/28/2019   Vaccine counseling 06/22/2017   Family history of heart disease 12/21/2013   Family history of colon cancer 12/21/2013   Nephrolithiasis 09/28/2009    Past Surgical History:  Procedure Laterality Date   COLONOSCOPY  08/2017   tubular adenoma, repeat 5 years; Dr. SCarolina Cellar  FOOT SURGERY     bone spur, right; Dr. HMilinda Pointer  IR NEPHROSTOMY PLACEMENT RIGHT  04/14/2021   NEPHROLITHOTOMY Right 05/17/2021   Procedure: NEPHROLITHOTOMY PERCUTANEOUS insertion double j stent;  Surgeon: MAlexis Frock MD;  Location: [facility];  Service: Urology;  Laterality: Right;   ROBOT ASSISTED LAPAROSCOPIC NEPHRECTOMY Left 07/26/2021   Procedure: XI ROBOTIC ASSISTED LAPAROSCOPIC NEPHRECTOMY;  Surgeon: MAlexis Frock MD;  Location: [facility];  Service: Urology;  Laterality: Left;  3 HRS   WISDOM TOOTH EXTRACTION     WISDOM TOOTH EXTRACTION         Home Medications    Prior to Admission medications   Medication Sig Start Date End Date Taking? Authorizing Provider  gabapentin (NEURONTIN) 300 MG capsule TAKE  1 CAPSULE(300 MG) BY MOUTH AT BEDTIME 08/30/21   Tysinger, Camelia Eng, PA-C  HYDROcodone-acetaminophen (NORCO) 5-325 MG tablet Take 1 tablet by mouth 2 (two) times daily as needed. 10/02/21   Tysinger, Camelia Eng, PA-C  sildenafil (VIAGRA) 100 MG tablet TAKE 1 TABLET(100 MG) BY MOUTH DAILY AS NEEDED FOR ERECTILE DYSFUNCTION 10/01/21   Tysinger, Camelia Eng, PA-C    Family History Family History  Problem Relation Age of Onset   Thyroid disease Mother    Other Mother        brain disease, possibly dementia?   Gallstones Mother    Other Father        spinal cord  injury/accident   Heart disease Father 14   Heart disease Sister 57       artificial valve   Colon cancer Sister    Heart disease Brother 66       MI   Diabetes Brother    Pancreatic cancer Brother    Cancer Sister 26       colon   Cancer Sister        pancreas   Pancreatic cancer Sister    Heart disease Sister    Heart disease Brother 52       pacemaker   Cancer Brother        liver, formerly pancreas   Heart disease Other        parent, other relative   Colon cancer Other    Stroke Neg Hx    Hypertension Neg Hx    Rectal cancer Neg Hx    Stomach cancer Neg Hx     Social History Social History   Tobacco Use   Smoking status: Former    Packs/day: 0.25    Years: 30.00    Pack years: 7.50    Types: Cigarettes    Quit date: 03/05/2009    Years since quitting: 12.6   Smokeless tobacco: Never   Tobacco comments:    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   Review of Systems Review of Systems Per HPI  Physical Exam Triage Vital Signs ED Triage Vitals  Enc Vitals Group     BP 10/08/21 1557 129/84     Pulse Rate 10/08/21 1557 74     Resp 10/08/21 1557 20     Temp 10/08/21 1557 (!) 97.4 F (36.3 C)     Temp Source 10/08/21 1557 Oral     SpO2 10/08/21 1557 98 %     Weight --      Height --      Head Circumference --      Peak Flow --      Pain Score 10/08/21 1559 0     Pain Loc --      Pain Edu? --      Excl. in [place]? --    No data found.  Updated Vital Signs BP 129/84 (BP Location: Right Arm)   Pulse 74   Temp (!) 97.4 F (36.3 C) (Oral)   Resp 20   SpO2 98%   Visual Acuity Right Eye Distance:   Left Eye Distance:   Bilateral Distance:  Right Eye Near:   Left Eye Near:     Bilateral Near:     Physical Exam Vitals and nursing note reviewed.  Constitutional:      General: He is not in acute distress.    Appearance: Normal appearance. He is not toxic-appearing.  Pulmonary:     Effort: Pulmonary effort is normal. No respiratory distress.  Genitourinary:    Comments: deferred Skin:    General: Skin is warm and dry.     Capillary Refill: Capillary refill takes less than 2 seconds.     Coloration: Skin is not jaundiced or pale.  Neurological:     Mental Status: He is alert and oriented to person, place, and time.     Motor: No weakness.     Gait: Gait normal.  Psychiatric:        Behavior: Behavior is cooperative.     UC Treatments / Results  Labs (all labs ordered are listed, but only abnormal results are displayed) Labs Reviewed  HIV ANTIBODY (ROUTINE TESTING W REFLEX)  RPR    EKG   Radiology No results found.  Procedures Procedures (including critical care time)  Medications Ordered in UC Medications  cefTRIAXone (ROCEPHIN) injection 500 mg (500 mg Intramuscular Given 10/08/21 1613)    Initial Impression / Assessment and Plan / UC Course  I have reviewed the triage vital signs and the nursing notes.  Pertinent labs & imaging results that were available during my care of the patient were reviewed by me and considered in my medical decision making (see chart for details).    Treat gonorrhea with ceftriaxone 500 mg IM today in urgent care.  We will also screen for HIV and syphilis.  Encouraged condom use with every sexual contour and spread of STI.  The patient was given the opportunity to ask questions.  All questions answered to their satisfaction.  The patient is in agreement to this plan.   Final Clinical Impressions(s) / UC Diagnoses   Final diagnoses:  Routine screening for STI (sexually transmitted infection)  Gonorrhea     Discharge Instructions      - We have given you a shot of ceftriaxone 500 mg today to treat  gonorrhea - We are also testing for HIV and syphilis and will let you know if these come back positive - Please wear condoms with every sexual encounter to prevent spread of STI    ED Prescriptions   None    PDMP not reviewed this encounter.   Eulogio Bear, NP 10/08/21 [phone]

## 2021-10-09 LAB — HIV ANTIBODY (ROUTINE TESTING W REFLEX): HIV Screen 4th Generation wRfx: NONREACTIVE

## 2021-10-09 LAB — RPR: RPR Ser Ql: NONREACTIVE

## 2021-10-12 ENCOUNTER — Ambulatory Visit
Admission: EM | Admit: 2021-10-12 | Discharge: 2021-10-12 | Disposition: A | Discharge status: Home / Self Care | Payer: BC Managed Care – PPO

## 2021-10-12 DIAGNOSIS — M7032 Other bursitis of elbow, left elbow: Secondary | ICD-10-CM

## 2021-10-12 NOTE — ED Triage Notes (Signed)
Pt reports swelling in left elbow x 2 days. Pt reports he had the same swelling in the right elbow a week ago after he was diagnosed with Gonorrhea and he wants to know if is related to Gonorrhea or the medication he had to the ceftriaxone.

## 2021-10-12 NOTE — ED Provider Notes (Signed)
[facility]    CSN: [id number] Arrival date & time: 10/12/21  0843      History   Chief Complaint Chief Complaint  Patient presents with   Elbow Pain    HPI Mitchell Hughes is a 56 y.o. male.   The history is provided by the patient.   Patient presents for swelling of the left elbow that has been present for the past 2 days.  Patient reports that he had the same or similar symptoms of the right elbow.  Patient was concerned because he was diagnosed with gonorrhea and received ceftriaxone for treatment.  He would like to ensure this is not related to that treatment or any residual symptoms.  Patient denies pain, injury, or trauma to the left elbow at this time.  He has not taken any medication for symptoms.  Past Medical History:  Diagnosis Date   Arthritis    Erectile dysfunction    Family history of colon cancer    sister in 40s   Family history of heart disease 2011   baseline cardiac eval 2011 with Dr. Sallyanne Kuster   H/O echocardiogram 2015   Dr. Wynonia Lawman   History of exercise stress test    04/2014 Dr. Wynonia Lawman, prior 2011 normal [place] treadmill stress test, Dr. Debara Pickett   History of kidney stones    LOW BACK PAIN, ACUTE 09/26/2009   RENAL CALCULUS, RIGHT 09/28/2009   9.77m on CT   TOBACCO USE, QUIT 09/26/2009   Wears glasses     Patient Active Problem List   Diagnosis Date Noted   Renal stone 05/17/2021   Sepsis with acute organ dysfunction ([facility] 04/19/2021   Kidney stone 04/19/2021   Renal mass 04/19/2021   Splenomegaly 04/19/2021   Sepsis ([facility] 04/14/2021   Acute lower UTI 04/14/2021   Bilateral hand numbness 02/14/2021   Localized swelling on right hand 02/14/2021   Sleep disturbance 02/14/2021   Snoring 02/14/2021   Erectile dysfunction 02/14/2021   Need for influenza vaccination 02/14/2021   Impingement syndrome of left shoulder 06/20/2020   Primary osteoarthritis of first carpometacarpal joint of left hand 06/20/2020   Encounter for health  maintenance examination in adult 02/17/2020   Screening for prostate cancer 02/17/2020   History of COVID-19 02/17/2020   Toenail deformity 02/17/2020   Neuropathy of foot, right 02/17/2020   Chronic pain of both shoulders 04/22/2019   Decreased range of motion of shoulder 04/22/2019   Degenerative arthritis of thumb, left 04/22/2019   Right inguinal hernia 01/28/2019   Aortic valve calcification 01/28/2019   Vaccine counseling 06/22/2017   Family history of heart disease 12/21/2013   Family history of colon cancer 12/21/2013   Nephrolithiasis 09/28/2009    Past Surgical History:  Procedure Laterality Date   COLONOSCOPY  08/2017   tubular adenoma, repeat 5 years; Dr. SCarolina Cellar  FOOT SURGERY     bone spur, right; Dr. HMilinda Pointer  IR NEPHROSTOMY PLACEMENT RIGHT  04/14/2021   NEPHROLITHOTOMY Right 05/17/2021   Procedure: NEPHROLITHOTOMY PERCUTANEOUS insertion double j stent;  Surgeon: MAlexis Frock MD;  Location: [facility];  Service: Urology;  Laterality: Right;   ROBOT ASSISTED LAPAROSCOPIC NEPHRECTOMY Left 07/26/2021   Procedure: XI ROBOTIC ASSISTED LAPAROSCOPIC NEPHRECTOMY;  Surgeon: MAlexis Frock MD;  Location: [facility];  Service: Urology;  Laterality: Left;  3 HRS   WISDOM TOOTH EXTRACTION     WISDOM TOOTH EXTRACTION         Home Medications    Prior to Admission medications  Medication Sig Start Date End Date Taking? Authorizing Provider  gabapentin (NEURONTIN) 300 MG capsule TAKE 1 CAPSULE(300 MG) BY MOUTH AT BEDTIME 08/30/21   Tysinger, Camelia Eng, PA-C  HYDROcodone-acetaminophen (NORCO) 5-325 MG tablet Take 1 tablet by mouth 2 (two) times daily as needed. 10/02/21   Tysinger, Camelia Eng, PA-C  sildenafil (VIAGRA) 100 MG tablet TAKE 1 TABLET(100 MG) BY MOUTH DAILY AS NEEDED FOR ERECTILE DYSFUNCTION 10/01/21   Tysinger, Camelia Eng, PA-C    Family History Family History  Problem Relation Age of Onset   Thyroid disease Mother    Other Mother        brain disease, possibly  dementia?   Gallstones Mother    Other Father        spinal cord injury/accident   Heart disease Father 81   Heart disease Sister 5       artificial valve   Colon cancer Sister    Heart disease Brother 24       MI   Diabetes Brother    Pancreatic cancer Brother    Cancer Sister 59       colon   Cancer Sister        pancreas   Pancreatic cancer Sister    Heart disease Sister    Heart disease Brother 43       pacemaker   Cancer Brother        liver, formerly pancreas   Heart disease Other        parent, other relative   Colon cancer Other    Stroke Neg Hx    Hypertension Neg Hx    Rectal cancer Neg Hx    Stomach cancer Neg Hx     Social History Social History   Tobacco Use   Smoking status: Former    Packs/day: 0.25    Years: 30.00    Total pack years: 7.50    Types: Cigarettes    Quit date: 03/05/2009    Years since quitting: 12.6   Smokeless tobacco: Never   Tobacco comments:    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   Review of Systems Review of Systems Per HPI  Physical Exam Triage Vital Signs ED Triage Vitals  Enc Vitals Group     BP 10/12/21 0919 131/76     Pulse Rate 10/12/21 0919 77     Resp 10/12/21 0919 18     Temp 10/12/21 0919 98 F (36.7 C)     Temp Source 10/12/21 0919 Oral     SpO2 10/12/21 0919 96 %     Weight --      Height --      Head Circumference --      Peak Flow --      Pain Score 10/12/21 0923 0     Pain Loc --      Pain Edu? --      Excl. in [place]? --    No data found.  Updated Vital Signs BP 131/76 (BP Location: Right Arm)   Pulse 77   Temp 98 F (36.7 C) (Oral)   Resp 18   SpO2 96%  Visual Acuity Right Eye Distance:   Left Eye  Distance:   Bilateral Distance:    Right Eye Near:   Left Eye Near:    Bilateral Near:     Physical Exam Vitals and nursing note reviewed.  Constitutional:      Appearance: Normal appearance.  Eyes:     Pupils: Pupils are equal, round, and reactive to light.  Cardiovascular:     Rate and Rhythm: Normal rate and regular rhythm.     Pulses: Normal pulses.     Heart sounds: Normal heart sounds.  Pulmonary:     Effort: Pulmonary effort is normal.     Breath sounds: Normal breath sounds.  Musculoskeletal:     Left elbow: Swelling and effusion present. Normal range of motion. No tenderness.     Cervical back: Normal range of motion.     Comments: Effusion of the left elbow is present.  Patient declined drainage of the infusion.  Neurological:     Mental Status: He is alert.      UC Treatments / Results  Labs (all labs ordered are listed, but only abnormal results are displayed) Labs Reviewed - No data to display  EKG   Radiology No results found.  Procedures Procedures (including critical care time)  Medications Ordered in UC Medications - No data to display  Initial Impression / Assessment and Plan / UC Course  I have reviewed the triage vital signs and the nursing notes.  Pertinent labs & imaging results that were available during my care of the patient were reviewed by me and considered in my medical decision making (see chart for details).  Patient presents for swelling of the left elbow that started approximately 2 days ago.  Patient was concerned because he was recently treated for gonorrhea, and would like to ensure his symptoms are not related to that.  On exam, he has an effusion of the left elbow joint consistent with bursitis.  Patient declined drainage of the effusion at this time.  Recommended supportive care treatment in the interim.  Patient advised to follow-up if symptoms do not improve. Final Clinical Impressions(s) / UC Diagnoses   Final  diagnoses:  Bursitis of left elbow, unspecified bursa     Discharge Instructions      May take ibuprofen or Tylenol for any pain, fever, or general discomfort. Recommend compression wrap to the left elbow to see if this helps with the swelling. May also apply ice to the left elbow, apply for 20 minutes, remove for 1 hour, then repeat. If symptoms do not improve, recommend return for possible drainage.     ED Prescriptions   None    PDMP not reviewed this encounter.   Tish Men, NP 10/12/21 [phone]

## 2021-10-12 NOTE — Discharge Instructions (Addendum)
May take ibuprofen or Tylenol for any pain, fever, or general discomfort. Recommend compression wrap to the left elbow to see if this helps with the swelling. May also apply ice to the left elbow, apply for 20 minutes, remove for 1 hour, then repeat. If symptoms do not improve, recommend return for possible drainage.

## 2021-10-13 ENCOUNTER — Ambulatory Visit: Payer: BC Managed Care – PPO

## 2021-10-18 ENCOUNTER — Ambulatory Visit
Admission: EM | Admit: 2021-10-18 | Discharge: 2021-10-18 | Disposition: A | Discharge status: Home / Self Care | Payer: BC Managed Care – PPO | Attending: Family Medicine | Admitting: Family Medicine

## 2021-10-18 DIAGNOSIS — M7022 Olecranon bursitis, left elbow: Secondary | ICD-10-CM

## 2021-10-18 NOTE — ED Provider Notes (Signed)
[facility]    CSN: [id number] Arrival date & time: 10/18/21  1756      History   Chief Complaint Chief Complaint  Patient presents with   Elbow Pain    HPI Mitchell Hughes is a 56 y.o. male.   Presenting today with 2-week history of left elbow bursitis, diagnosed here.  States it has not worsened since then but it has not improved either.  No redness, warmth, drainage, fever, chills but notes that it is slightly tender when bumped.  Declined an aspiration 2 weeks ago when first diagnosed but interested in 1 today.  He states he had something similar on the right side several weeks ago but that resolved.  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    Past Medical History:  Diagnosis Date   Arthritis    Erectile dysfunction    Family history of colon cancer    sister in 74s   Family history of heart disease 2011   baseline cardiac eval 2011 with Dr. Sallyanne Kuster   H/O echocardiogram 2015   Dr. Wynonia Lawman   History of exercise stress test    04/2014 Dr. Wynonia Lawman, prior 2011 normal [place] treadmill stress test, Dr. Debara Pickett   History of kidney stones    LOW BACK PAIN, ACUTE 09/26/2009   RENAL CALCULUS, RIGHT 09/28/2009   9.44m on CT   TOBACCO USE, QUIT 09/26/2009   Wears glasses     Patient Active Problem List   Diagnosis Date Noted   Renal stone 05/17/2021   Sepsis with acute organ dysfunction ([facility] 04/19/2021   Kidney stone 04/19/2021   Renal mass 04/19/2021   Splenomegaly 04/19/2021   [place] 04/14/[place]   Acute lower UTI 04/14/2021   Bilateral hand numbness 02/14/2021   Localized swelling on right hand 02/14/2021   Sleep disturbance 02/14/2021   Snoring 02/14/2021   Erectile dysfunction 02/14/2021   Need for influenza vaccination 02/14/2021   Impingement syndrome of left shoulder 06/20/2020   Primary osteoarthritis of first carpometacarpal joint of  left hand 06/20/2020   Encounter for health maintenance examination in adult 02/17/2020   Screening for prostate cancer 02/17/2020   History of COVID-19 02/17/2020   Toenail deformity 02/17/2020   Neuropathy of foot, right 02/17/2020   Chronic pain of both shoulders 04/22/2019   Decreased range of motion of shoulder 04/22/2019   Degenerative arthritis of thumb, left 04/22/2019   Right inguinal hernia 01/28/2019   Aortic valve calcification 01/28/2019   Vaccine counseling 06/22/2017   Family history of heart disease 12/21/2013   Family history of colon cancer 12/21/2013   Nephrolithiasis 09/28/2009    Past Surgical History:  Procedure Laterality Date   COLONOSCOPY  08/2017   tubular adenoma, repeat 5 years; Dr. SCarolina Cellar  FOOT SURGERY     bone spur, right; Dr. HMilinda Pointer  IR NEPHROSTOMY PLACEMENT RIGHT  04/14/2021   NEPHROLITHOTOMY Right 05/17/2021   Procedure: NEPHROLITHOTOMY PERCUTANEOUS insertion double j stent;  Surgeon: MAlexis Frock MD;  Location: [facility];  Service: Urology;  Laterality: Right;   ROBOT ASSISTED LAPAROSCOPIC NEPHRECTOMY Left 07/26/2021   Procedure: XI ROBOTIC ASSISTED LAPAROSCOPIC NEPHRECTOMY;  Surgeon: MAlexis Frock MD;  Location: [facility];  Service: Urology;  Laterality: Left;  3 HRS   WISDOM TOOTH EXTRACTION     WISDOM TOOTH EXTRACTION  Home Medications    Prior to Admission medications   Medication Sig Start Date End Date Taking? Authorizing Provider  gabapentin (NEURONTIN) 300 MG capsule TAKE 1 CAPSULE(300 MG) BY MOUTH AT BEDTIME 08/30/21   Tysinger, Camelia Eng, PA-C  HYDROcodone-acetaminophen (NORCO) 5-325 MG tablet Take 1 tablet by mouth 2 (two) times daily as needed. 10/02/21   Tysinger, Camelia Eng, PA-C  sildenafil (VIAGRA) 100 MG tablet TAKE 1 TABLET(100 MG) BY MOUTH DAILY AS NEEDED FOR ERECTILE DYSFUNCTION 10/01/21   Tysinger, Camelia Eng, PA-C    Family History Family History  Problem Relation Age of Onset   Thyroid disease Mother     Other Mother        brain disease, possibly dementia?   Gallstones Mother    Other Father        spinal cord injury/accident   Heart disease Father 16   Heart disease Sister 80       artificial valve   Colon cancer Sister    Heart disease Brother 40       MI   Diabetes Brother    Pancreatic cancer Brother    Cancer Sister 75       colon   Cancer Sister        pancreas   Pancreatic cancer Sister    Heart disease Sister    Heart disease Brother 26       pacemaker   Cancer Brother        liver, formerly pancreas   Heart disease Other        parent, other relative   Colon cancer Other    Stroke Neg Hx    Hypertension Neg Hx    Rectal cancer Neg Hx    Stomach cancer Neg Hx     Social History Social History   Tobacco Use   Smoking status: Former    Packs/day: 0.25    Years: 30.00    Total pack years: 7.50    Types: Cigarettes    Quit date: 03/05/2009    Years since quitting: 12.6   Smokeless tobacco: Never   Tobacco comments:    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   Review of Systems Review of Systems Per HPI  Physical Exam Triage Vital Signs ED Triage Vitals [10/18/21 1811]  Enc Vitals Group     BP 111/76     Pulse Rate 96     Resp 20     Temp 98 F (36.7 C)     Temp Source Oral     SpO2 97 %     Weight      Height      Head Circumference      Peak Flow      Pain Score 0     Pain Loc      Pain Edu?      Excl. in [place]?    No data found.  Updated Vital Signs BP 111/76 (BP Location: Right Arm)   Pulse 96   Temp 98 F (36.7 C) (Oral)   Resp 20   SpO2 97%   Visual Acuity Right Eye Distance:  Left Eye Distance:   Bilateral Distance:    Right Eye Near:   Left  Eye Near:    Bilateral Near:     Physical Exam Vitals and nursing note reviewed.  Constitutional:      Appearance: Normal appearance.  HENT:     Head: Atraumatic.  Eyes:     Extraocular Movements: Extraocular movements intact.     Conjunctiva/sclera: Conjunctivae normal.  Cardiovascular:     Rate and Rhythm: Normal rate and regular rhythm.  Pulmonary:     Effort: Pulmonary effort is normal.     Breath sounds: Normal breath sounds.  Musculoskeletal:        General: Normal range of motion.     Cervical back: Normal range of motion and neck supple.     Comments: Left olecranon bursitis, fluctuant.  No erythema, no significant tenderness to palpation and good range of motion at the left elbow.  Skin:    General: Skin is warm and dry.     Findings: No bruising or erythema.  Neurological:     General: No focal deficit present.     Mental Status: He is oriented to person, place, and time.     Comments: Left upper extremity neurovascular intact  Psychiatric:        Mood and Affect: Mood normal.        Thought Content: Thought content normal.        Judgment: Judgment normal.      UC Treatments / Results  Labs (all labs ordered are listed, but only abnormal results are displayed) Labs Reviewed - No data to display  EKG   Radiology No results found.  Procedures Join Aspiration/Injection  Date/Time: 10/18/2021 6:51 PM  Performed by: Volney American, PA-C Authorized by: Volney American, PA-C   Consent:    Consent obtained:  Verbal   Consent given by:  Patient   Risks, benefits, and alternatives were discussed: yes     Risks discussed:  Bleeding, infection, pain, nerve damage and incomplete drainage   Alternatives discussed:  Alternative treatment Universal protocol:    Procedure explained and questions answered to patient or proxy's satisfaction: yes     Relevant documents present and verified: yes     Patient identity confirmed:  Verbally with  patient Location:    Location:  Elbow   Elbow:  L elbow Anesthesia:    Anesthesia method:  Local infiltration   Local anesthetic:  Lidocaine 2% w/o epi Procedure details:    Preparation: Patient was prepped and draped in usual sterile fashion     Needle gauge:  18 G   Ultrasound guidance: no     Aspirate amount:  5 cc   Aspirate characteristics:  Blood-tinged and clear   Steroid injected: no   Post-procedure details:    Dressing:  Gauze roll   Procedure completion:  Tolerated well, no immediate complications  (including critical care time)  Medications Ordered in UC Medications - No data to display  Initial Impression / Assessment and Plan / UC Course  I have reviewed the triage vital signs and the nursing notes.  Pertinent labs & imaging results that were available during my care of the patient were reviewed by me and considered in my medical decision making (see chart for details).     Bursa aspirated, compression dressing placed and orthopedic follow-up recommended if worsening or not resolving.  No evidence of infection today.  Discussed good home wound care and return precautions.  Final  Clinical Impressions(s) / UC Diagnoses   Final diagnoses:  Olecranon bursitis of left elbow   Discharge Instructions   None    ED Prescriptions   None    PDMP not reviewed this encounter.   Volney American, [place] 10/18/21 1853

## 2021-10-18 NOTE — ED Triage Notes (Signed)
Pt states that his left elbow is swollen since Tuesday of last week  Pt states that he had the same thing happened with the right elbow about 2 weeks ago

## 2021-10-24 ENCOUNTER — Encounter: Payer: Self-pay | Admitting: Orthopaedic Surgery

## 2021-10-24 ENCOUNTER — Ambulatory Visit (INDEPENDENT_AMBULATORY_CARE_PROVIDER_SITE_OTHER): Payer: BC Managed Care – PPO | Admitting: Orthopaedic Surgery

## 2021-10-24 ENCOUNTER — Ambulatory Visit (INDEPENDENT_AMBULATORY_CARE_PROVIDER_SITE_OTHER): Payer: BC Managed Care – PPO

## 2021-10-24 VITALS — Ht 77.0 in | Wt 225.0 lb

## 2021-10-24 DIAGNOSIS — M7022 Olecranon bursitis, left elbow: Secondary | ICD-10-CM

## 2021-10-24 DIAGNOSIS — G8929 Other chronic pain: Secondary | ICD-10-CM

## 2021-10-24 DIAGNOSIS — M79645 Pain in left finger(s): Secondary | ICD-10-CM | POA: Diagnosis not present

## 2021-10-24 DIAGNOSIS — R202 Paresthesia of skin: Secondary | ICD-10-CM

## 2021-10-24 DIAGNOSIS — M25522 Pain in left elbow: Secondary | ICD-10-CM | POA: Diagnosis not present

## 2021-10-24 NOTE — Progress Notes (Unsigned)
Office Visit Note   Patient: Mitchell Hughes           Date of Birth: [birth year]           MRN: [id number] Visit Date: 10/24/2021              Requested by: Carlena Hurl, PA-C [address] [place],  [place] [address] PCP: Carlena Hurl, PA-C   Assessment & Plan: Visit Diagnoses:  1. Chronic thumb pain, left   2. Olecranon bursitis, left elbow   3. Paresthesia of both hands     Plan: Impression is left elbow olecranon bursitis, left thumb CMC joint osteoarthritis and bilateral hand paresthesias concerning for carpal tunnel syndrome.  In regards to the left elbow, approximately 16 cc of blood tinged fluid was aspirated from the left olecranon bursa.  This was then injected with cortisone.  My suspicion is very low for infection, but I would like to send the fluid off for cell count, crystals and culture.  We will call the patient with results.  In regards to the [facility] joint, we aspirated this with cortisone today.  In regards to the probable carpal tunnel syndrome, we have made a referral to Dr. Ernestina Patches for nerve conduction study.  Follow-up once completed.  In the meantime, we have discussed that he should take time away from working out at the gym.  Call with concerns or questions in meantime.    Follow-Up Instructions: Return if symptoms worsen or fail to improve.   Orders:  Orders Placed This Encounter  Procedures   Medium Joint Inj: L olecranon bursa   Hand/UE Inj: L thumb CMC   XR Elbow Complete Left (3+View)   XR Finger Thumb Left   No orders of the defined types were placed in this encounter.     Procedures: Medium Joint Inj: L olecranon bursa on 10/24/2021 9:58 AM Details: 22 G needle Medications: 1 mL lidocaine 1 %; 13.33 mg methylPREDNISolone acetate 40 MG/ML; 0.66 mL bupivacaine 0.25 %   Hand/UE Inj: L thumb CMC for osteoarthritis on 10/24/2021 9:59 AM Medications: 3 mL lidocaine 1 %; 0.33 mL bupivacaine 0.25 %; 13.33 mg methylPREDNISolone acetate 40  MG/ML      Clinical Data: No additional findings.   Subjective: Chief Complaint  Patient presents with   Left Hand - Pain    Thumb pain   Left Elbow - Pain    HPI patient is a pleasant 56 year old left-hand-dominant gentleman who comes in today with left elbow swelling, left thumb pain and bilateral hand paresthesias.  Regards to his left elbow, he noticed increased swelling to the olecranon bursa about 2 weeks ago.  He was seen by an urgent care setting a week ago where this was aspirated.  The swelling has recurred.  He denies any injury, bug bite, insect bite cut or scrape or any other trauma to the left elbow.  He does note he has continued to work out at the gym following the elbow aspiration last week.  He denies any pain to the elbow.  No fevers or chills.  The other issue he brings up today is left thumb pain.  This is been ongoing for years and is progressively worsened.  The pain is to the base of the thumb and is worse when he is opening jars, driving or picking things up.  He does note bilateral hand paresthesias which are worse at night where he frequently wakes up shaking his hands.  No previous nerve  conduction study either upper extremity.  Of note, he has tried bilateral carpal tunnel bracing without relief.  He is on chronic oxycodone which does not seem to help.  Review of Systems as detailed in HPI.  All others reviewed and are negative.   Objective: Vital Signs: Ht '6\' 5"'$  (1.956 m)   Wt 225 lb (102.1 kg)   BMI 26.68 kg/m   Physical Exam well-developed well-nourished gentleman in no acute distress.  Alert and oriented x3.  Ortho Exam left elbow exam reveals a golf ball size swelling to the olecranon bursa.  No skin changes.  No erythema.  No tenderness.  Full range of motion the elbow without pain.  Left thumb exam reveals pain to the base of the thumb.  He has pain but no crepitus with grind test.  Full range of motion.  He is neurovascular intact distally.   Bilateral hand exam shows negative Phalen and negative Tinel.  Specialty Comments:  No specialty comments available.  Imaging: XR Elbow Complete Left (3+View)  Result Date: 10/24/2021 X-rays demonstrate significant osteophyte formation to the olecranon  XR Finger Thumb Left  Result Date: 10/24/2021 X-rays demonstrate mild degenerative changes to the first [facility] joint    PMFS History: Patient Active Problem List   Diagnosis Date Noted   Renal stone 05/17/2021   Sepsis with acute organ dysfunction ([place]) 04/19/2021   Kidney stone 04/19/2021   Renal mass 04/19/2021   Splenomegaly 04/19/2021   Sepsis ([place]) 04/14/2021   Acute lower UTI 04/14/2021   Bilateral hand numbness 02/14/2021   Localized swelling on right hand 02/14/2021   Sleep disturbance 02/14/2021   Snoring 02/14/2021   Erectile dysfunction 02/14/2021   Need for influenza vaccination 02/14/2021   Impingement syndrome of left shoulder 06/20/2020   Primary osteoarthritis of first carpometacarpal joint of left hand 06/20/2020   Encounter for health maintenance examination in adult 02/17/2020   Screening for prostate cancer 02/17/2020   History of COVID-19 02/17/2020   Toenail deformity 02/17/2020   Neuropathy of foot, right 02/17/2020   Chronic pain of both shoulders 04/22/2019   Decreased range of motion of shoulder 04/22/2019   Degenerative arthritis of thumb, left 04/22/2019   Right inguinal hernia 01/28/2019   Aortic valve calcification 01/28/2019   Vaccine counseling 06/22/2017   Family history of heart disease 12/21/2013   Family history of colon cancer 12/21/2013   Nephrolithiasis 09/28/2009   Past Medical History:  Diagnosis Date   Arthritis    Erectile dysfunction    Family history of colon cancer    sister in 23s   Family history of heart disease 2011   baseline cardiac eval 2011 with Dr. Sallyanne Kuster   H/O echocardiogram 2015   Dr. Wynonia Lawman   History of exercise stress test    04/2014 Dr. Wynonia Lawman, prior  2011 normal [facility] treadmill stress test, Dr. Debara Pickett   History of kidney stones    LOW BACK PAIN, ACUTE 09/26/2009   RENAL CALCULUS, RIGHT 09/28/2009   9.83m on CT   TOBACCO USE, QUIT 09/26/2009   Wears glasses     Family History  Problem Relation Age of Onset   Thyroid disease Mother    Other Mother        brain disease, possibly dementia?   Gallstones Mother    Other Father        spinal cord injury/accident   Heart disease Father 74  Heart disease Sister 445      artificial valve  Colon cancer Sister    Heart disease Brother 46       MI   Diabetes Brother    Pancreatic cancer Brother    Cancer Sister 29       colon   Cancer Sister        pancreas   Pancreatic cancer Sister    Heart disease Sister    Heart disease Brother 108       pacemaker   Cancer Brother        liver, formerly pancreas   Heart disease Other        parent, other relative   Colon cancer Other    Stroke Neg Hx    Hypertension Neg Hx    Rectal cancer Neg Hx    Stomach cancer Neg Hx     Past Surgical History:  Procedure Laterality Date   COLONOSCOPY  08/2017   tubular adenoma, repeat 5 years; Dr. [place] Cellar   FOOT SURGERY     bone spur, right; Dr. Milinda Pointer   IR NEPHROSTOMY PLACEMENT RIGHT  04/14/2021   NEPHROLITHOTOMY Right 05/17/2021   Procedure: NEPHROLITHOTOMY PERCUTANEOUS insertion double j stent;  Surgeon: Alexis Frock, MD;  Location: [facility];  Service: Urology;  Laterality: Right;   ROBOT ASSISTED LAPAROSCOPIC NEPHRECTOMY Left 07/26/2021   Procedure: XI ROBOTIC ASSISTED LAPAROSCOPIC NEPHRECTOMY;  Surgeon: Alexis Frock, MD;  Location: [facility];  Service: Urology;  Laterality: Left;  3 HRS   WISDOM TOOTH EXTRACTION     WISDOM TOOTH EXTRACTION     Social History   Occupational History   Not on file  Tobacco Use   Smoking status: Former    Packs/day: 0.25    Years: 30.00    Total pack years: 7.50    Types: Cigarettes    Quit date: 03/05/2009    Years since quitting: 12.6    Smokeless tobacco: Never   Tobacco comments:    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

## 2021-10-25 MED ORDER — BUPIVACAINE HCL 0.25 % IJ SOLN
0.6600 mL | INTRAMUSCULAR | Status: AC | PRN
  Administered 2021-10-24: .66 mL via INTRA_ARTICULAR

## 2021-10-25 MED ORDER — LIDOCAINE HCL 1 % IJ SOLN
3.0000 mL | INTRAMUSCULAR | Status: AC | PRN
  Administered 2021-10-24: 3 mL

## 2021-10-25 MED ORDER — METHYLPREDNISOLONE ACETATE 40 MG/ML IJ SUSP
13.3300 mg | INTRAMUSCULAR | Status: AC | PRN
  Administered 2021-10-24: 13.33 mg

## 2021-10-25 MED ORDER — BUPIVACAINE HCL 0.25 % IJ SOLN
0.3300 mL | INTRAMUSCULAR | Status: AC | PRN
  Administered 2021-10-24: .33 mL

## 2021-10-25 MED ORDER — METHYLPREDNISOLONE ACETATE 40 MG/ML IJ SUSP
13.3300 mg | INTRAMUSCULAR | Status: AC | PRN
  Administered 2021-10-24: 13.33 mg via INTRA_ARTICULAR

## 2021-10-25 MED ORDER — LIDOCAINE HCL 1 % IJ SOLN
1.0000 mL | INTRAMUSCULAR | Status: AC | PRN
  Administered 2021-10-24: 1 mL

## 2021-10-30 LAB — ANAEROBIC AND AEROBIC CULTURE
AER RESULT:: NO GROWTH
MICRO NUMBER:: 13559542
MICRO NUMBER:: 13559543
SPECIMEN QUALITY:: ADEQUATE
SPECIMEN QUALITY:: ADEQUATE

## 2021-10-30 LAB — SYNOVIAL FLUID ANALYSIS, COMPLETE
Basophils, %: 0 %
Eosinophils-Synovial: 2 % (ref 0–2)
Lymphocytes-Synovial Fld: 65 % (ref 0–74)
Monocyte/Macrophage: 12 % (ref 0–69)
Neutrophil, Synovial: 21 % (ref 0–24)
Synoviocytes, %: 0 % (ref 0–15)
WBC, Synovial: 441 cells/uL — ABNORMAL HIGH (ref <150)

## 2021-10-31 ENCOUNTER — Telehealth: Payer: Self-pay | Admitting: Physical Medicine and Rehabilitation

## 2021-10-31 NOTE — Telephone Encounter (Signed)
Patient returned call asked for a call back   Ph# is [phone]

## 2021-11-05 ENCOUNTER — Telehealth: Payer: Self-pay | Admitting: Medical

## 2021-11-05 NOTE — Telephone Encounter (Signed)
Referral Followup °

## 2021-11-21 ENCOUNTER — Ambulatory Visit (INDEPENDENT_AMBULATORY_CARE_PROVIDER_SITE_OTHER): Payer: BC Managed Care – PPO | Admitting: Neurology

## 2021-11-21 ENCOUNTER — Encounter: Payer: Self-pay | Admitting: Neurology

## 2021-11-21 VITALS — BP 109/66 | HR 64 | Ht 77.0 in | Wt 228.0 lb

## 2021-11-21 DIAGNOSIS — R0683 Snoring: Secondary | ICD-10-CM | POA: Diagnosis not present

## 2021-11-21 DIAGNOSIS — G4730 Sleep apnea, unspecified: Secondary | ICD-10-CM | POA: Diagnosis not present

## 2021-11-21 DIAGNOSIS — G4726 Circadian rhythm sleep disorder, shift work type: Secondary | ICD-10-CM

## 2021-11-21 DIAGNOSIS — E663 Overweight: Secondary | ICD-10-CM

## 2021-11-21 DIAGNOSIS — G4719 Other hypersomnia: Secondary | ICD-10-CM

## 2021-11-21 DIAGNOSIS — R0681 Apnea, not elsewhere classified: Secondary | ICD-10-CM

## 2021-11-21 DIAGNOSIS — Z820 Family history of epilepsy and other diseases of the nervous system: Secondary | ICD-10-CM

## 2021-11-21 NOTE — Patient Instructions (Addendum)
Thank you for choosing [facility] for your sleep related care! It was nice to meet you today!   Here is what we discussed today:   You can utilize melatonin 5 to 10 mg 1 to 2 hours before projected bedtimes, you can take it even daily if needed.   Based on your symptoms and your exam I believe you are at risk for obstructive sleep apnea (aka OSA). We should proceed with a sleep study to determine whether you do or do not have OSA and how severe it is. Even, if you have mild OSA, I may want you to consider treatment with CPAP, as treatment of even borderline or mild sleep apnea can result and improvement of symptoms such as sleep disruption, daytime sleepiness, nighttime bathroom breaks, restless leg symptoms, improvement of headache syndromes, even improved mood disorder.   As explained, an attended sleep study (meaning you get to stay overnight in the sleep lab), lets [place] monitor sleep-related behaviors such as sleep talking and leg movements in sleep, in addition to monitoring for sleep apnea.  A home sleep test is a screening tool for sleep apnea diagnosis only, but unfortunately, does not help with any other sleep-related diagnoses.  Please remember, the long-term risks and ramifications of untreated moderate to severe obstructive sleep apnea may include (but are not limited to): increased risk for cardiovascular disease, including congestive heart failure, stroke, difficult to control hypertension, treatment resistant obesity, arrhythmias, especially irregular heartbeat commonly known as A. Fib. (atrial fibrillation); even type 2 diabetes has been linked to untreated OSA.   Other correlations that untreated obstructive sleep apnea include macular edema which is swelling of the retina in the eyes, droopy eyelid syndrome, and elevated hemoglobin and hematocrit levels (often referred to as polycythemia).  Sleep apnea can cause disruption of sleep and sleep deprivation in most  cases, which, in turn, can cause recurrent headaches, problems with memory, mood, concentration, focus, and vigilance. Most people with untreated sleep apnea report excessive daytime sleepiness, which can affect their ability to drive. Please do not drive or use heavy equipment or machinery, if you feel sleepy! Patients with sleep apnea can also develop difficulty initiating and maintaining sleep (aka insomnia).   Having sleep apnea may increase your risk for other sleep disorders, including involuntary behaviors sleep such as sleep terrors, sleep talking, sleepwalking.    Having sleep apnea can also increase your risk for restless leg syndrome and leg movements at night.   Please note that untreated obstructive sleep apnea may carry additional perioperative morbidity. Patients with significant obstructive sleep apnea (typically, in the moderate to severe degree) should receive, if possible, perioperative PAP (positive airway pressure) therapy and the surgeons and particularly the anesthesiologists should be informed of the diagnosis and the severity of the sleep disordered breathing.   We will call you or email you through [place] with regards to your test results and plan a follow-up in sleep clinic accordingly. Most likely, you will hear from one of our nurses.   Our sleep lab administrative assistant will call you to schedule your sleep study and give you further instructions, regarding the check in process for the sleep study, arrival time, what to bring, when you can expect to leave after the study, etc., and to answer any other logistical questions you may have. If you don't hear back from her by about 2 weeks from now, please feel free to call her direct line at [phone] or you can call our general clinic  number, or email [place] through My Chart.

## 2021-11-21 NOTE — Progress Notes (Signed)
Subjective:    Patient ID: Mitchell Hughes is a 55 y.o. male.  HPI    Star Age, MD, PhD [facility] [address], [address] [place], [place] [address]  Dear Audelia Acton,   I saw your patient, Mitchell Hughes, upon your kind request in my sleep clinic today for initial consultation of his sleep disorder with particular, concern for underlying obstructive sleep apnea.  The patient is unaccompanied today.  As you know, Mitchell Hughes is a 56 year old right-handed gentleman with an underlying medical history of low back pain, kidney stones, olecranon bursitis, arthritis, and overweight state, who reports difficulty maintaining sleep and snoring.  I reviewed your office note from 10/01/2021.  His Epworth sleepiness score is 8 out of 24, fatigue severity score is 22 out of 63.  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  He has chronic difficulty maintaining sleep for years, even before he started shift work.  He works from 7 PM to 7 AM, typically 2 days on, 2 days off, then 3 days on, working every other weekend.  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  He has tried melatonin as needed for sleep, typically 5 mg but does not take it every day.  He tries to keep a set schedule but when he is off he tries to sleep at night.  Bedtime is generally around 9, he sleeps about 4 to 5 hours consistently and when he wakes up he has trouble going back to sleep.  He is divorced, he lives alone, no pets in the house, does have a TV in the bedroom and sometimes falls asleep with the TV on but not always.  He has 2 grown sons and a granddaughter and grandson from one of his sons.  He quit smoking over 10 years ago, he drinks alcohol occasionally, not daily, he drinks coffee about 2 cups before going into work.  He denies night to night nocturia or recurrent morning or nocturnal headaches.  He has 2 sisters with sleep apnea.  He has been told that he snores and he  has also been told that he has pauses in his breathing while asleep.  He has been working on weight loss, lately his weight has been stable, in the past few months he lost about 10 pounds altogether.  His Past Medical History Is Significant For: Past Medical History:  Diagnosis Date   Arthritis    Erectile dysfunction    Family history of colon cancer    sister in 72s   Family history of heart disease 2011   baseline cardiac eval 2011 with Dr. Sallyanne Kuster   H/O echocardiogram 2015   Dr. Wynonia Lawman   History of exercise stress test    04/2014 Dr. Wynonia Lawman, prior 2011 normal Bruce treadmill stress test, Dr. Debara Pickett   History of kidney stones    LOW BACK PAIN, ACUTE 09/26/2009   RENAL CALCULUS, RIGHT 09/28/2009   9.15m on CT   TOBACCO USE, QUIT 09/26/2009   Wears glasses     His Past Surgical History Is Significant For: Past Surgical History:  Procedure Laterality Date   COLONOSCOPY  08/2017   tubular adenoma, repeat 5 years; Dr. SCarolina Cellar  FOOT SURGERY     bone spur, right; Dr. HMilinda Pointer  IR NEPHROSTOMY PLACEMENT RIGHT  04/14/2021   NEPHROLITHOTOMY Right 05/17/2021   Procedure: NEPHROLITHOTOMY PERCUTANEOUS insertion double j stent;  Surgeon: MAlexis Frock MD;  Location: [facility];  Service: Urology;  Laterality: Right;   ROBOT ASSISTED LAPAROSCOPIC NEPHRECTOMY Left 07/26/2021   Procedure: XI ROBOTIC ASSISTED LAPAROSCOPIC NEPHRECTOMY;  Surgeon: Alexis Frock, MD;  Location: [facility];  Service: Urology;  Laterality: Left;  3 HRS   WISDOM TOOTH EXTRACTION     WISDOM TOOTH EXTRACTION      His Family History Is Significant For: Family History  Problem Relation Age of Onset   Thyroid disease Mother    Other Mother        brain disease, possibly dementia?   Gallstones Mother    Other Father        spinal cord injury/accident   Heart disease Father 75   Heart disease Sister 37       artificial valve   Colon cancer Sister    Sleep apnea Sister    Cancer Sister 60       colon    Sleep apnea Sister    Cancer Sister        pancreas   Pancreatic cancer Sister    Heart disease Sister    Heart disease Brother 37       MI   Diabetes Brother    Pancreatic cancer Brother    Heart disease Brother 54       pacemaker   Cancer Brother        liver, formerly pancreas   Heart disease Other        parent, other relative   Colon cancer Other    Stroke Neg Hx    Hypertension Neg Hx    Rectal cancer Neg Hx    Stomach cancer Neg Hx     His Social History Is Significant For: Social History   Socioeconomic History   Marital status: Single    Spouse name: Not on file   Number of children: Not on file   Years of education: Not on file   Highest education level: Not on file  Occupational History   Not on file  Tobacco Use   Smoking status: Former    Packs/day: 0.25    Years: 30.00    Total pack years: 7.50    Types: Cigarettes    Quit date: 03/05/2009    Years since quitting: 12.7   Smokeless tobacco: Never   Tobacco comments:    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  01/2019   Social Determinants of Health   Financial Resource Strain: Not on file  Food Insecurity: Not on file  Transportation Needs: Not on file  Physical Activity: Not on file  Stress: Not on file  Social Connections: Not on file    His Allergies Are:  No Known Allergies:   His Current Medications Are:  Outpatient Encounter Medications as of 11/21/2021  Medication Sig   gabapentin (NEURONTIN) 300 MG capsule TAKE 1 CAPSULE(300 MG) BY  MOUTH AT BEDTIME   HYDROcodone-acetaminophen (NORCO) 5-325 MG tablet Take 1 tablet by mouth 2 (two) times  daily as needed.   Multiple Vitamin (MULTIVITAMIN PO) Take by mouth.   sildenafil (VIAGRA) 100 MG tablet TAKE 1 TABLET(100 MG) BY MOUTH DAILY AS NEEDED FOR ERECTILE DYSFUNCTION   Facility-Administered Encounter Medications as of 11/21/2021  Medication   polyethylene glycol powder (GLYCOLAX/MIRALAX) container 255 g  :   Review of Systems:  Out of a complete 14 point review of systems, all are reviewed and negative with the exception of these symptoms as listed below:  Review of Systems  Neurological:        Pt here for sleep consult Pt states he snores Pt denies Hypertension,headaches,fatigue, Sleep study and CPAP machine    ESS:8 FSS:22    Objective:  Neurological Exam  Physical Exam Physical Examination:   Vitals:   11/21/21 0915  BP: 109/66  Pulse: 64    General Examination: The patient is a very pleasant 56 y.o. male in no acute distress. He appears well-developed and well-nourished and very well groomed.   HEENT: Normocephalic, atraumatic, pupils are equal, round and reactive to light, extraocular tracking is good without limitation to gaze excursion or nystagmus noted. Hearing is grossly intact. Face is symmetric with normal facial animation. Speech is clear with no dysarthria noted. There is no hypophonia. There is no lip, neck/head, jaw or voice tremor. Neck is supple with full range of passive and active motion. There are no carotid bruits on auscultation. Oropharynx exam reveals: mild mouth dryness, good dental hygiene and moderate airway crowding, due to widened uvula, tonsillar size of 1-2+, Mallampati class III.  Neck circumference of 17-1/8 inches.  Mild to moderate overbite noted.  Tongue protrudes centrally and palate elevates symmetrically.  Chest: Clear to auscultation without wheezing, rhonchi or crackles noted.  Heart: S1+S2+0, regular and normal without  murmurs, rubs or gallops noted.   Abdomen: Soft, non-tender and non-distended.  Extremities: There is no pitting edema in the distal lower extremities bilaterally.   Skin: Warm and dry without trophic changes noted.   Musculoskeletal: exam reveals no obvious joint deformities.   Neurologically:  Mental status: The patient is awake, alert and oriented in all 4 spheres. His immediate and remote memory, attention, language skills and fund of knowledge are appropriate. There is no evidence of aphasia, agnosia, apraxia or anomia. Speech is clear with normal prosody and enunciation. Thought process is linear. Mood is normal and affect is normal.  Cranial nerves II - XII are as described above under HEENT exam.  Motor exam: Normal bulk, strength and tone is noted. There is no tremor, Romberg is negative. Reflexes are 2+ throughout. Fine motor skills and coordination: grossly intact.  Cerebellar testing: No dysmetria or intention tremor. There is no truncal or gait ataxia.  Sensory exam: intact to light touch in the upper and lower extremities.  Gait, station and balance: He stands easily. No veering to one side is noted. No leaning to one side is noted. Posture is age-appropriate and stance is narrow based. Gait shows normal stride length and normal pace. No problems turning are noted.  Assessment and Plan:  In summary, Mitchell Hughes is a very pleasant 56 y.o.-year old male with an underlying medical history of low back pain, kidney stones, olecranon bursitis, arthritis, and overweight state, whose history and physical exam are concerning for sleep disordered breathing, supporting a current working diagnosis of unspecified sleep apnea, with the main differential diagnoses of obstructive sleep apnea (OSA) versus upper airway resistance syndrome (UARS) versus central sleep apnea (CSA), or mixed sleep  apnea. A laboratory attended sleep study is considered gold standard for evaluation of sleep disordered  breathing and is recommended at this time and clinically justified.   I had a long chat with the patient about my findings and the diagnosis of sleep apnea, particularly OSA, its prognosis and treatment options. We talked about medical/conservative treatments, surgical interventions and non-pharmacological approaches for symptom control. I explained, in particular, the risks and ramifications of untreated moderate to severe OSA, especially with respect to developing cardiovascular disease down the road, including congestive heart failure (CHF), difficult to treat hypertension, cardiac arrhythmias (particularly A-fib), neurovascular complications including TIA, stroke and dementia. Even type 2 diabetes has, in part, been linked to untreated OSA. Symptoms of untreated OSA may include (but may not be limited to) daytime sleepiness, nocturia (i.e. frequent nighttime urination), memory problems, mood irritability and suboptimally controlled or worsening mood disorder such as depression and/or anxiety, lack of energy, lack of motivation, physical discomfort, as well as recurrent headaches, especially morning or nocturnal headaches. We talked about the importance of maintaining a healthy lifestyle and striving for healthy weight. In addition, we talked about the importance of striving for and maintaining good sleep hygiene.  Shifting sleep-wake schedule because of his work schedule is certainly a contributor to his sleep disturbance.  He can utilize melatonin 5 to 10 mg 1 to 2 hours before projected bedtimes, he can take it even daily if needed. I recommended the following at this time: sleep study.  I outlined the differences between a laboratory attended sleep study which is considered more comprehensive and accurate over the option of a home sleep test (HST); the latter may lead to underestimation of sleep disordered breathing in some instances and does not help with diagnosing upper airway resistance syndrome and is  not accurate enough to diagnose primary central sleep apnea typically. I explained the different sleep test procedures to the patient in detail and also outlined possible surgical and non-surgical treatment options of OSA, including the use of a pressure airway pressure (PAP) device (ie CPAP, AutoPAP/APAP or BiPAP in certain circumstances), a custom-made dental device (aka oral appliance, which would require a referral to a specialist dentist or orthodontist typically, and is generally speaking not considered a good choice for patients with full dentures or edentulous state), upper airway surgical options, such as traditional UPPP (which is not considered a first-line treatment) or the Inspire device (hypoglossal nerve stimulator, which would involve a referral for consultation with an ENT surgeon, after careful selection, following inclusion criteria). I explained the PAP treatment option to the patient in detail, as this is generally considered first-line treatment.  The patient indicated that he would be willing to try PAP therapy, if the need arises. I explained the importance of being compliant with PAP treatment, not only for insurance purposes but primarily to improve patient's symptoms symptoms, and for the patient's long term health benefit, including to reduce His cardiovascular risks longer-term.    We will pick up our discussion about the next steps and treatment options after testing.  We will keep him posted as to the test results by phone call and/or MyChart messaging where possible.  We will plan to follow-up in sleep clinic accordingly as well.  I answered all his questions today and the patient was in agreement.   I encouraged him to call with any interim questions, concerns, problems or updates or email [place] through [place].  Generally speaking, sleep test authorizations may take up to 2 weeks, sometimes  less, sometimes longer, the patient is encouraged to get in touch with [place] if they do not  hear back from the sleep lab staff directly within the next 2 weeks.  Thank you very much for allowing me to participate in the care of this nice patient. If I can be of any further assistance to you please do not hesitate to call me at [phone].  Sincerely,   Star Age, MD, PhD

## 2021-11-24 ENCOUNTER — Ambulatory Visit
Admission: RE | Admit: 2021-11-24 | Discharge: 2021-11-24 | Disposition: A | Discharge status: Home / Self Care | Payer: BC Managed Care – PPO | Source: Ambulatory Visit

## 2021-11-24 ENCOUNTER — Ambulatory Visit (INDEPENDENT_AMBULATORY_CARE_PROVIDER_SITE_OTHER): Payer: BC Managed Care – PPO

## 2021-11-24 VITALS — BP 148/80 | HR 70 | Temp 97.8°F | Resp 18

## 2021-11-24 DIAGNOSIS — M5136 Other intervertebral disc degeneration, lumbar region: Secondary | ICD-10-CM | POA: Diagnosis not present

## 2021-11-24 DIAGNOSIS — M545 Low back pain, unspecified: Secondary | ICD-10-CM

## 2021-11-24 MED ORDER — DEXAMETHASONE SODIUM PHOSPHATE 10 MG/ML IJ SOLN
10.0000 mg | INTRAMUSCULAR | Status: AC
  Administered 2021-11-24: 10 mg via INTRAMUSCULAR

## 2021-11-24 MED ORDER — IBUPROFEN 800 MG PO TABS: 800.0000 mg | ORAL_TABLET | Freq: Three times a day (TID) | ORAL | 0 refills | Status: DC | PRN

## 2021-11-24 MED ORDER — KETOROLAC TROMETHAMINE 30 MG/ML IJ SOLN
30.0000 mg | Freq: Once | INTRAMUSCULAR | Status: AC
  Administered 2021-11-24: 30 mg via INTRAMUSCULAR

## 2021-11-24 MED ORDER — METHOCARBAMOL 500 MG PO TABS: 500.0000 mg | ORAL_TABLET | Freq: Two times a day (BID) | ORAL | 0 refills | Status: DC

## 2021-11-24 NOTE — ED Provider Notes (Signed)
[facility]    CSN: [id number] Arrival date & time: 11/24/21  0846      History   Chief Complaint Chief Complaint  Patient presents with   Back Pain    Lower back pain - Entered by patient    HPI Mitchell Hughes is a 56 y.o. male.   The history is provided by the patient.   Patient presents for complaints of low back pain that started approximately 4 days ago.  Patient states he has a history of chronic back pain, but it has not been bothering him in quite some time.  Patient states he had back pain earlier on the day that his symptoms started but it went away.  He states he then went to the gym and began doing sit ups when his pain returned.  He states that he stopped.  He states over the past 24 to 48 hours, his symptoms have gradually worsened.  Pain worsens when going from a sitting to a standing position and with movement.  Pain is described as sharp and radiates into his buttocks and hips.  He denies fever, chills, abdominal pain, change in bowel pattern, loss of bowel or bladder function, lower extremity weakness, numbness, tingling.  He states that he has taken ibuprofen for his symptoms.  Past Medical History:  Diagnosis Date   Arthritis    Erectile dysfunction    Family history of colon cancer    sister in 86s   Family history of heart disease 2011   baseline cardiac eval 2011 with Dr. Sallyanne Kuster   H/O echocardiogram 2015   Dr. Wynonia Lawman   History of exercise stress test    04/2014 Dr. Wynonia Lawman, prior 2011 normal Bruce treadmill stress test, Dr. Debara Pickett   History of kidney stones    LOW BACK PAIN, ACUTE 09/26/2009   RENAL CALCULUS, RIGHT 09/28/2009   9.33m on CT   TOBACCO USE, QUIT 09/26/2009   Wears glasses     Patient Active Problem List   Diagnosis Date Noted   Renal stone 05/17/2021   Sepsis with acute organ dysfunction ([place] 04/19/2021   Kidney stone 04/19/2021   Renal mass 04/19/2021   Splenomegaly 04/19/2021   Sepsis ([facility] 04/14/2021   Acute lower  UTI 04/14/2021   Bilateral hand numbness 02/14/2021   Localized swelling on right hand 02/14/2021   Sleep disturbance 02/14/2021   Snoring 02/14/2021   Erectile dysfunction 02/14/2021   Need for influenza vaccination 02/14/2021   Impingement syndrome of left shoulder 06/20/2020   Primary osteoarthritis of first carpometacarpal joint of left hand 06/20/2020   Encounter for health maintenance examination in adult 02/17/2020   Screening for prostate cancer 02/17/2020   History of COVID-19 02/17/2020   Toenail deformity 02/17/2020   Neuropathy of foot, right 02/17/2020   Chronic pain of both shoulders 04/22/2019   Decreased range of motion of shoulder 04/22/2019   Degenerative arthritis of thumb, left 04/22/2019   Right inguinal hernia 01/28/2019   Aortic valve calcification 01/28/2019   Vaccine counseling 06/22/2017   Family history of heart disease 12/21/2013   Family history of colon cancer 12/21/2013   Nephrolithiasis 09/28/2009    Past Surgical History:  Procedure Laterality Date   COLONOSCOPY  08/2017   tubular adenoma, repeat 5 years; Dr. SCarolina Cellar  FOOT SURGERY     bone spur, right; Dr. HMilinda Pointer  IR NEPHROSTOMY PLACEMENT RIGHT  04/14/2021   NEPHROLITHOTOMY Right 05/17/2021   Procedure: NEPHROLITHOTOMY PERCUTANEOUS insertion double j stent;  Surgeon: Alexis Frock, MD;  Location: [facility];  Service: Urology;  Laterality: Right;   ROBOT ASSISTED LAPAROSCOPIC NEPHRECTOMY Left 07/26/2021   Procedure: XI ROBOTIC ASSISTED LAPAROSCOPIC NEPHRECTOMY;  Surgeon: Alexis Frock, MD;  Location: [facility];  Service: Urology;  Laterality: Left;  3 HRS   WISDOM TOOTH EXTRACTION     WISDOM TOOTH EXTRACTION         Home Medications    Prior to Admission medications   Medication Sig Start Date End Date Taking? Authorizing Provider  ibuprofen (ADVIL) 800 MG tablet Take 1 tablet (800 mg total) by mouth every 8 (eight) hours as needed. 11/24/21  Yes Zedekiah Hinderman-Warren, Alda Lea, NP   methocarbamol (ROBAXIN) 500 MG tablet Take 1 tablet (500 mg total) by mouth 2 (two) times daily. 11/24/21  Yes Paulett Kaufhold-Warren, Alda Lea, NP  gabapentin (NEURONTIN) 300 MG capsule TAKE 1 CAPSULE(300 MG) BY MOUTH AT BEDTIME 08/30/21   Tysinger, Camelia Eng, PA-C  HYDROcodone-acetaminophen (NORCO) 5-325 MG tablet Take 1 tablet by mouth 2 (two) times daily as needed. 10/02/21   Tysinger, Camelia Eng, PA-C  Multiple Vitamin (MULTIVITAMIN PO) Take by mouth.    [provider]  sildenafil (VIAGRA) 100 MG tablet TAKE 1 TABLET(100 MG) BY MOUTH DAILY AS NEEDED FOR ERECTILE DYSFUNCTION 10/01/21   Tysinger, Camelia Eng, PA-C    Family History Family History  Problem Relation Age of Onset   Thyroid disease Mother    Other Mother        brain disease, possibly dementia?   Gallstones Mother    Other Father        spinal cord injury/accident   Heart disease Father 70   Heart disease Sister 27       artificial valve   Colon cancer Sister    Sleep apnea Sister    Cancer Sister 40       colon   Sleep apnea Sister    Cancer Sister        pancreas   Pancreatic cancer Sister    Heart disease Sister    Heart disease Brother 34       MI   Diabetes Brother    Pancreatic cancer Brother    Heart disease Brother 90       pacemaker   Cancer Brother        liver, formerly pancreas   Heart disease Other        parent, other relative   Colon cancer Other    Stroke Neg Hx    Hypertension Neg Hx    Rectal cancer Neg Hx    Stomach cancer Neg Hx     Social History Social History   Tobacco Use   Smoking status: Former    Packs/day: 0.25    Years: 30.00    Total pack years: 7.50    Types: Cigarettes    Quit date: 03/05/2009    Years since quitting: 12.7   Smokeless tobacco: Never   Tobacco comments:    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   Review of Systems Review of Systems Per HPI  Physical Exam Triage Vital Signs ED Triage  Vitals  Enc Vitals Group     BP 11/24/21 0852 (!) 148/80     Pulse Rate 11/24/21 0852 70     Resp 11/24/21 0852 18     Temp 11/24/21 0852 97.8 F (36.6 C)     Temp Source 11/24/21 0852 Oral     SpO2 11/24/21 0852 97 %     Weight --      Height --      Head Circumference --      Peak Flow --      Pain Score 11/24/21 0853 10     Pain Loc --      Pain Edu? --      Excl. in [place]? --    No data found.  Updated Vital Signs BP (!) 148/80   Pulse 70   Temp 97.8 F (36.6 C) (Oral)   Resp 18   SpO2 97%   Visual Acuity Right Eye Distance:   Left Eye Distance:   Bilateral Distance:    Right Eye Near:   Left Eye Near:    Bilateral Near:     Physical Exam Vitals and nursing note reviewed.  Constitutional:      General: He is in acute distress (Appears uncomfortable due to back pain).     Appearance: Normal appearance.  HENT:     Head: Normocephalic.  Eyes:     Extraocular Movements: Extraocular movements intact.     Pupils: Pupils are equal, round, and reactive to light.  Cardiovascular:     Rate and Rhythm: Normal rate and regular rhythm.     Pulses: Normal pulses.     Heart sounds: Normal heart sounds.  Pulmonary:     Effort: Pulmonary effort is normal.     Breath sounds: Normal breath sounds.  Abdominal:     General: Bowel sounds are normal.     Palpations: Abdomen is soft.  Musculoskeletal:     Cervical back: Normal range of motion.     Lumbar back: Spasms present. No swelling, deformity or tenderness. Decreased range of motion.  Lymphadenopathy:     Cervical: No cervical adenopathy.  Skin:    General: Skin is warm and dry.  Neurological:     General: No focal deficit present.     Mental Status: He is alert and oriented to person, place, and  time.  Psychiatric:        Mood and Affect: Mood normal.        Behavior: Behavior normal.      UC Treatments / Results  Labs (all labs ordered are listed, but only abnormal results are displayed) Labs Reviewed - No data to display  EKG   Radiology DG Lumbar Spine Complete  Result Date: 11/24/2021 CLINICAL DATA:  4 day history of low back pain. EXAM: LUMBAR SPINE - COMPLETE 4+ VIEW COMPARISON:  None Available. FINDINGS: No fracture. No subluxation. Diffuse loss of intervertebral disc height noted in the lumbar spine, most advanced at T12-L1 and L2-3. SI joints and symphysis pubis unremarkable. IMPRESSION: Degenerative disc disease in the lumbar spine. No acute bony abnormality. Electronically Signed   By: Misty Stanley M.D.   On: 11/24/2021 09:20    Procedures Procedures (including critical care time)  Medications Ordered in UC Medications  dexamethasone (DECADRON) injection 10 mg (10 mg Intramuscular Given 11/24/21 0932)  ketorolac (TORADOL) 30 MG/ML injection 30 mg (30 mg Intramuscular Given 11/24/21 0932)    Initial Impression / Assessment and Plan / UC Course  I  have reviewed the triage vital signs and the nursing notes.  Pertinent labs & imaging results that were available during my care of the patient were reviewed by me and considered in my medical decision making (see chart for details).  Patient presents for complaints of low back pain that is been present for the past 4 days.  On exam, patient does not exhibit any tenderness to the area but does have spasm.  X-rays show degenerative disc disease.  Patient administered Decadron and Toradol in the office.  Will provide prescriptions for methocarbamol and ibuprofen 800 mg.  Patient was advised that he should follow-up with orthopedics for further evaluation.  Supportive care recommendations were provided to the patient.  Patient advised to follow-up as needed. Final Clinical Impressions(s) / UC Diagnoses   Final diagnoses:   Midline low back pain without sciatica, unspecified chronicity     Discharge Instructions      Your x-ray showed degeneration of the disc in your back.  This can be caused by normal wear-and-tear and as we get older. Take medication as prescribed. Gentle stretching and range of motion exercises to help decrease recovery time and improve mobility. May apply ice or heat as needed.  Apply ice for pain or swelling, heat for spasm or stiffness.  Apply for 20 minutes, remove for 1 hour, then repeat as needed. As discussed, it may be helpful for you to follow-up with orthopedics for further evaluation.  You can follow-up with [facility] [place] at [phone] or [facility] in [place] at [phone]. Follow-up as needed.     ED Prescriptions     Medication Sig Dispense Auth. Provider   methocarbamol (ROBAXIN) 500 MG tablet Take 1 tablet (500 mg total) by mouth 2 (two) times daily. 20 tablet Dehlia Kilner-Warren, Alda Lea, NP   ibuprofen (ADVIL) 800 MG tablet Take 1 tablet (800 mg total) by mouth every 8 (eight) hours as needed. 30 tablet Demari Gales-Warren, Alda Lea, NP      PDMP not reviewed this encounter.   Tish Men, NP 11/24/21 [phone]

## 2021-11-24 NOTE — Discharge Instructions (Addendum)
Your x-ray showed degeneration of the disc in your back.  This can be caused by normal wear-and-tear and as we get older. Take medication as prescribed. Gentle stretching and range of motion exercises to help decrease recovery time and improve mobility. May apply ice or heat as needed.  Apply ice for pain or swelling, heat for spasm or stiffness.  Apply for 20 minutes, remove for 1 hour, then repeat as needed. As discussed, it may be helpful for you to follow-up with orthopedics for further evaluation.  You can follow-up with [facility] [place] at [phone] or [facility] in [place] at [phone]. Follow-up as needed.

## 2021-11-24 NOTE — ED Triage Notes (Signed)
Pt reports lower back pain x 4 days. Pt was worse when he did sit ups at the gym. Ibuprofen gives no relief.

## 2021-12-10 ENCOUNTER — Telehealth: Payer: Self-pay | Admitting: Neurology

## 2021-12-10 NOTE — Telephone Encounter (Signed)
YJE-HUDJ#[id number] 8/2-9/30/2023). Patient is scheduled at [facility] for 01/07/22 at 9 AM.  Mailed packet to the patient.

## 2021-12-13 DIAGNOSIS — N1831 Chronic kidney disease, stage 3a: Secondary | ICD-10-CM | POA: Diagnosis not present

## 2021-12-13 DIAGNOSIS — N200 Calculus of kidney: Secondary | ICD-10-CM | POA: Diagnosis not present

## 2021-12-13 DIAGNOSIS — Z905 Acquired absence of kidney: Secondary | ICD-10-CM | POA: Diagnosis not present

## 2022-01-03 DIAGNOSIS — G4733 Obstructive sleep apnea (adult) (pediatric): Secondary | ICD-10-CM

## 2022-01-03 HISTORY — DX: Obstructive sleep apnea (adult) (pediatric): G47.33

## 2022-01-08 ENCOUNTER — Encounter: Payer: Self-pay | Admitting: Internal Medicine

## 2022-01-08 NOTE — Telephone Encounter (Signed)
pt no show 01/07/22 appt lvm to r/s

## 2022-01-13 ENCOUNTER — Other Ambulatory Visit: Payer: Self-pay | Admitting: Urology

## 2022-01-13 DIAGNOSIS — C642 Malignant neoplasm of left kidney, except renal pelvis: Secondary | ICD-10-CM

## 2022-01-13 DIAGNOSIS — N200 Calculus of kidney: Secondary | ICD-10-CM

## 2022-01-13 NOTE — Telephone Encounter (Signed)
Patient r/s for 01/14/22 to arrive at 3 pm.  HST-auth#[id number] 8/2-9/30/2023).

## 2022-01-14 ENCOUNTER — Ambulatory Visit: Payer: BC Managed Care – PPO | Admitting: Neurology

## 2022-01-14 DIAGNOSIS — R0683 Snoring: Secondary | ICD-10-CM

## 2022-01-14 DIAGNOSIS — G4726 Circadian rhythm sleep disorder, shift work type: Secondary | ICD-10-CM

## 2022-01-14 DIAGNOSIS — Z820 Family history of epilepsy and other diseases of the nervous system: Secondary | ICD-10-CM

## 2022-01-14 DIAGNOSIS — G4730 Sleep apnea, unspecified: Secondary | ICD-10-CM

## 2022-01-14 DIAGNOSIS — G4733 Obstructive sleep apnea (adult) (pediatric): Secondary | ICD-10-CM

## 2022-01-14 DIAGNOSIS — R0681 Apnea, not elsewhere classified: Secondary | ICD-10-CM

## 2022-01-14 DIAGNOSIS — G4719 Other hypersomnia: Secondary | ICD-10-CM

## 2022-01-14 DIAGNOSIS — E663 Overweight: Secondary | ICD-10-CM

## 2022-01-21 ENCOUNTER — Ambulatory Visit (INDEPENDENT_AMBULATORY_CARE_PROVIDER_SITE_OTHER): Payer: BC Managed Care – PPO | Admitting: Orthopaedic Surgery

## 2022-01-21 ENCOUNTER — Encounter: Payer: Self-pay | Admitting: Orthopaedic Surgery

## 2022-01-21 DIAGNOSIS — M778 Other enthesopathies, not elsewhere classified: Secondary | ICD-10-CM

## 2022-01-21 DIAGNOSIS — M7022 Olecranon bursitis, left elbow: Secondary | ICD-10-CM | POA: Diagnosis not present

## 2022-01-21 NOTE — Progress Notes (Signed)
Office Visit Note   Patient: Mitchell Hughes           Date of Birth: [birth year]           MRN: [id number] Visit Date: 01/21/2022              Requested by: Carlena Hurl, PA-C [address] [place],  [place] [address] PCP: Carlena Hurl, PA-C   Assessment & Plan: Visit Diagnoses:  1. Olecranon bursitis, left elbow   2. Tendinitis of left triceps     Plan: I impression is symptomatic triceps enthesophyte and resultant and recurrent olecranon bursitis.  Based on treatment options he would like to have the bursa surgically excised as well as removal of the enthesophyte with possible reattachment of the triceps tendon.  Risk benefits prognosis reviewed.  If light duty is available he could return back to work 2 weeks postop.  Debbie met with the patient today.  Follow-Up Instructions: No follow-ups on file.   Orders:  No orders of the defined types were placed in this encounter.  No orders of the defined types were placed in this encounter.     Procedures: No procedures performed   Clinical Data: No additional findings.   Subjective: Chief Complaint  Patient presents with   Left Elbow - Pain    HPI Mitchell Hughes returns today for recurrent left elbow pain and swelling.  Underwent olecranon bursal aspiration and cortisone injection about 3 months ago which helped temporarily.  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  He has pain with elbow extension and exertion.  Review of Systems   Objective: Vital Signs: There were no vitals taken for this visit.  Physical Exam  Ortho Exam Examination left elbow shows recurrent olecranon bursitis.  No evidence of infection.  Full range of motion of the elbow.  He has point tenderness to the insertion of the triceps and a palpable enthesophyte. Specialty Comments:  No specialty comments available.  Imaging: No results found.   PMFS History: Patient Active Problem  List   Diagnosis Date Noted   Olecranon bursitis, left elbow 01/21/2022   Tendinitis of left triceps 01/21/2022   Renal stone 05/17/2021   Sepsis with acute organ dysfunction ([place]) 04/19/2021   Kidney stone 04/19/2021   Renal mass 04/19/2021   Splenomegaly 04/19/2021   Sepsis ([place]) 04/14/2021   Acute lower UTI 04/14/2021   Bilateral hand numbness 02/14/2021   Localized swelling on right hand 02/14/2021   Sleep disturbance 02/14/2021   Snoring 02/14/2021   Erectile dysfunction 02/14/2021   Need for influenza vaccination 02/14/2021   Impingement syndrome of left shoulder 06/20/2020   Primary osteoarthritis of first carpometacarpal joint of left hand 06/20/2020   Encounter for health maintenance examination in adult 02/17/2020   Screening for prostate cancer 02/17/2020   History of COVID-19 02/17/2020   Toenail deformity 02/17/2020   Neuropathy of foot, right 02/17/2020   Chronic pain of both shoulders 04/22/2019   Decreased range of motion of shoulder 04/22/2019   Degenerative arthritis of thumb, left 04/22/2019   Right inguinal hernia 01/28/2019   Aortic valve calcification 01/28/2019   Vaccine counseling 06/22/2017   Family history of heart disease 12/21/2013   Family history of colon cancer 12/21/2013   Nephrolithiasis 09/28/2009   Past Medical History:  Diagnosis Date   Arthritis    Erectile dysfunction    Family history of colon cancer    sister in 36s  Family history of heart disease 2011   baseline cardiac eval 2011 with Dr. Sallyanne Kuster   H/O echocardiogram 2015   Dr. Wynonia Lawman   History of exercise stress test    04/2014 Dr. Wynonia Lawman, prior 2011 normal Bruce treadmill stress test, Dr. Debara Pickett   History of kidney stones    LOW BACK PAIN, ACUTE 09/26/2009   RENAL CALCULUS, RIGHT 09/28/2009   9.36m on CT   TOBACCO USE, QUIT 09/26/2009   Wears glasses     Family History  Problem Relation Age of Onset   Thyroid disease Mother    Other Mother        brain disease,  possibly dementia?   Gallstones Mother    Other Father        spinal cord injury/accident   Heart disease Father 746  Heart disease Sister 476      artificial valve   Colon cancer Sister    Sleep apnea Sister    Cancer Sister 520      colon   Sleep apnea Sister    Cancer Sister        pancreas   Pancreatic cancer Sister    Heart disease Sister    Heart disease Brother 645      MI   Diabetes Brother    Pancreatic cancer Brother    Heart disease Brother 521      pacemaker   Cancer Brother        liver, formerly pancreas   Heart disease Other        parent, other relative   Colon cancer Other    Stroke Neg Hx    Hypertension Neg Hx    Rectal cancer Neg Hx    Stomach cancer Neg Hx     Past Surgical History:  Procedure Laterality Date   COLONOSCOPY  08/2017   tubular adenoma, repeat 5 years; Dr. SCarolina Cellar  FOOT SURGERY     bone spur, right; Dr. HMilinda Pointer  IR NEPHROSTOMY PLACEMENT RIGHT  04/14/2021   NEPHROLITHOTOMY Right 05/17/2021   Procedure: NEPHROLITHOTOMY PERCUTANEOUS insertion double j stent;  Surgeon: MAlexis Frock MD;  Location: [facility];  Service: Urology;  Laterality: Right;   ROBOT ASSISTED LAPAROSCOPIC NEPHRECTOMY Left 07/26/2021   Procedure: XI ROBOTIC ASSISTED LAPAROSCOPIC NEPHRECTOMY;  Surgeon: MAlexis Frock MD;  Location[facility];  Service: Urology;  Laterality: Left;  3 HRS   WISDOM TOOTH EXTRACTION     WISDOM TOOTH EXTRACTION     Social History   Occupational History   Not on file  Tobacco Use   Smoking status: Former    Packs/day: 0.25    Years: 30.00    Total pack years: 7.50    Types: Cigarettes    Quit date: 03/05/2009    Years since quitting: 12.8   Smokeless tobacco: Never   Tobacco comments:    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

## 2022-01-21 NOTE — Procedures (Signed)
[facility]  HOME SLEEP TEST (Watch PAT) REPORT  STUDY DATE: 01/15/22  DOB: 03/22/1966  MRN: [id number]  ORDERING CLINICIAN: Star Age, MD, PhD   REFERRING CLINICIAN: Tysinger, Camelia Eng, PA-C   CLINICAL INFORMATION/HISTORY: 56 year old man with a history of low back pain, kidney stones, olecranon bursitis, arthritis, and overweight state, who reports difficulty maintaining sleep and snoring.  I reviewed your office note from 10/01/2021.  His Epworth sleepiness score is 8 out of 24, fatigue severity score is 22 out of 63.  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   Epworth sleepiness score: 8/24.  BMI: 27.1 kg/m  FINDINGS:   Sleep Summary:   Total Recording Time (hours, min): 4 hours, 53 min  Total Sleep Time (hours, min):  4 hours, 27 min  Percent REM (%):    25.5%   Respiratory Indices:   Calculated pAHI (per hour):  47.3/hour         REM pAHI:    44.6/hour       NREM pAHI: 48.3/hour  Central pAHI: 5.3/hour  Oxygen Saturation Statistics:    Oxygen Saturation (%) Mean: 93%   Minimum oxygen saturation (%):                 78%   O2 Saturation Range (%): 78-98%    O2 Saturation (minutes) <=88%: 0.2 min  Pulse Rate Statistics:   Pulse Mean (bpm):    58/min    Pulse Range (43-88/min)   IMPRESSION: OSA (obstructive sleep apnea), severe  RECOMMENDATION:  This home sleep test demonstrates severe obstructive sleep apnea with a total AHI of 47.3/hour and O2 nadir of 78%.  Intermittent mild to moderate snoring was detected.  The study was conducted during the day.  The patient is a third shift worker, total sleep time was slightly less than 4-1/2 hours. Treatment with positive airway pressure is highly recommended. This will require - ideally - a full night CPAP titration study for proper treatment settings, O2 monitoring and mask fitting. For now, the patient will be advised to proceed with an autoPAP titration/trial at home.   A laboratory attended titration study can be considered in the future for optimization of his treatment and better tolerance of therapy.  Alternative treatment options are limited secondary to the severity of the patient's sleep disordered breathing, but may include surgical treatment options or a dental device in selected patients.  Concomitant weight loss is recommended were appropriate.  Please note, that untreated obstructive sleep apnea may carry additional perioperative morbidity. Patients with significant obstructive sleep apnea should receive perioperative PAP therapy and the surgeons and particularly the anesthesiologist should be informed of the diagnosis and the severity of the sleep disordered breathing. The patient should be cautioned not to drive, work at heights, or operate dangerous or heavy equipment when tired or sleepy. Review and reiteration of good sleep hygiene measures should be pursued with any patient. Other causes of the patient's symptoms, including circadian rhythm disturbances, an underlying mood disorder, medication effect and/or an underlying medical problem cannot be ruled out based on this test. Clinical correlation is recommended. The patient and his referring provider will be notified of the test results. The patient will be seen in follow up in sleep clinic at [facility].  I certify that I have reviewed the raw data recording prior to the issuance of this report in accordance with the standards of the American Academy of Sleep Medicine (AASM).   INTERPRETING  PHYSICIAN:   Star Age, MD, PhD  Board Certified in Neurology and Sleep Medicine  [facility] [address], [place], [place] [address] [phone]

## 2022-01-21 NOTE — Addendum Note (Signed)
Addended by: Star Age on: 01/21/2022 05:27 PM   Modules accepted: Orders

## 2022-01-27 ENCOUNTER — Telehealth: Payer: Self-pay | Admitting: *Deleted

## 2022-01-27 NOTE — Telephone Encounter (Signed)
Called pt & LVM with office number asking for call back.  

## 2022-01-27 NOTE — Telephone Encounter (Signed)
-----   Message from Star Age, MD sent at 01/21/2022  5:27 PM EDT ----- Patient referred by PCP, seen by me on 11/21/2021, patient had a HST on 01/15/2022.    Please call and notify the patient that the recent home sleep test showed obstructive sleep apnea in the severe range. I recommend treatment for this in the form of autoPAP, which means, that we don't have to bring him in for a sleep study with CPAP, but will let him start using a so called autoPAP machine at home, through a DME company (of his choice, or as per insurance requirement). The DME representative will fit the patient with a mask of choice, educate him on how to use the machine, how to put the mask on, etc. I have placed an order in the chart. Please send the order to a local DME, talk to patient, send report to referring MD. Please also reinforce the need for compliance with treatment. We will need a FU in sleep clinic for 10 weeks post-PAP set up, please arrange that with me or one of our NPs. Thanks,   Star Age, MD, PhD [facility])

## 2022-01-29 NOTE — Telephone Encounter (Signed)
Pt returned my call. We discussed his sleep study results and recommendation for treatment with autopap. Pt verbalized understanding and he is amenable to the proposed plan. He does not have preference for DME but states [facility] would be just fine. Discussed insurance compliance requirements which includes using hte machine at least 4 hours at night and also being seen here at [facility] for initial visit 30-90 days after setup. Pt was scheduled for 04/15/2022 at 1045 am. Pt would like f/u letter sent through mychart.   Autopap referral faxed to [place]. Received a receipt of confirmation. F/u letter sent to pt via mychart.

## 2022-01-30 ENCOUNTER — Ambulatory Visit (INDEPENDENT_AMBULATORY_CARE_PROVIDER_SITE_OTHER): Payer: BC Managed Care – PPO | Admitting: Medical

## 2022-01-30 VITALS — BP 120/78 | HR 84 | Wt 228.2 lb

## 2022-01-30 DIAGNOSIS — Z23 Encounter for immunization: Secondary | ICD-10-CM

## 2022-01-30 DIAGNOSIS — M779 Enthesopathy, unspecified: Secondary | ICD-10-CM | POA: Diagnosis not present

## 2022-01-30 DIAGNOSIS — M7022 Olecranon bursitis, left elbow: Secondary | ICD-10-CM | POA: Diagnosis not present

## 2022-01-30 DIAGNOSIS — K4090 Unilateral inguinal hernia, without obstruction or gangrene, not specified as recurrent: Secondary | ICD-10-CM | POA: Diagnosis not present

## 2022-01-30 NOTE — Progress Notes (Signed)
Subjective:  Mitchell Hughes is a 56 y.o. male who presents for Chief Complaint  Patient presents with   discuss elbow surgery    Discuss left elbow surgery- has a bone spur.      Having  left elbow problems x 4 months.  Been seeing ortho.  Has recurrent olecranon bursitis and bone spur.  Had drainage and steroid shot few months ago.  This helped briefly but the area has continued to bother him.  Very painful at times .  Worse if putting pressure on that area.   It can be quite painful at times.  He is right handed.    He also wonders about going ahead and get his inguinal hernia repaired.  If he has to be out of work for the elbow he would like to go ahead and do the hernia surgery as well.  No other aggravating or relieving factors.    No other c/o.  The following portions of the patient's history were reviewed and updated as appropriate: allergies, current medications, past family history, past medical history, past social history, past surgical history and problem list.  ROS Otherwise as in subjective above  Objective: BP 120/78   Pulse 84   Wt 228 lb 3.2 oz (103.5 kg)   BMI 27.06 kg/m   General appearance: alert, no distress, well developed, well nourished Left elbow with a soft tissue swelling and tenderness over the olecranon Bulging right inguinal hernia, reducible but somewhat tender Pulses: 2+ radial pulses, 2+ pedal pulses, normal cap refill Ext: no edema   Assessment: Encounter Diagnoses  Name Primary?   Olecranon bursitis of left elbow Yes   Needs flu shot    Right inguinal hernia    Bone spur      Plan: Olecranon bursitis recurrent.  I reviewed his recent orthopedic notes.  I recommend he follow-up advice of the surgeon and move forward with surgery.  He has a quite large bone spur over the olecranon seems to have a lot of pain.  This seems like a relatively low risk surgery compared to other joints.  He will follow-up as planned for surgery next month  We  will go ahead and refer to general surgery to try to go ahead and get on the schedule for right inguinal hernia repair.  He would like the time of the surgery so that he can be out for the same time instead of having to take additional time out of work after the first of the year  Counseled on the influenza virus vaccine.  Vaccine information sheet given.  Influenza vaccine given after consent obtained.  Kepler was seen today for discuss elbow surgery.  Diagnoses and all orders for this visit:  Olecranon bursitis of left elbow  Needs flu shot -     Flu Vaccine QUAD 74moIM (Fluarix, Fluzone & Alfiuria Quad PF)  Right inguinal hernia -     Ambulatory referral to General Surgery  Bone spur    Follow up: surgery as scheduled

## 2022-02-03 DIAGNOSIS — Z125 Encounter for screening for malignant neoplasm of prostate: Secondary | ICD-10-CM | POA: Diagnosis not present

## 2022-02-03 DIAGNOSIS — C642 Malignant neoplasm of left kidney, except renal pelvis: Secondary | ICD-10-CM | POA: Diagnosis not present

## 2022-02-04 ENCOUNTER — Ambulatory Visit
Admission: RE | Admit: 2022-02-04 | Discharge: 2022-02-04 | Disposition: A | Discharge status: Home / Self Care | Payer: BC Managed Care – PPO | Source: Ambulatory Visit | Attending: Urology | Admitting: Urology

## 2022-02-04 ENCOUNTER — Other Ambulatory Visit: Payer: Self-pay | Admitting: Urology

## 2022-02-04 DIAGNOSIS — K5730 Diverticulosis of large intestine without perforation or abscess without bleeding: Secondary | ICD-10-CM | POA: Diagnosis not present

## 2022-02-04 DIAGNOSIS — C642 Malignant neoplasm of left kidney, except renal pelvis: Secondary | ICD-10-CM

## 2022-02-04 DIAGNOSIS — K3189 Other diseases of stomach and duodenum: Secondary | ICD-10-CM | POA: Diagnosis not present

## 2022-02-04 DIAGNOSIS — N200 Calculus of kidney: Secondary | ICD-10-CM

## 2022-02-04 DIAGNOSIS — N289 Disorder of kidney and ureter, unspecified: Secondary | ICD-10-CM | POA: Diagnosis not present

## 2022-02-04 DIAGNOSIS — Z85528 Personal history of other malignant neoplasm of kidney: Secondary | ICD-10-CM | POA: Diagnosis not present

## 2022-02-04 MED ORDER — IOPAMIDOL (ISOVUE-300) INJECTION 61%
80.0000 mL | Freq: Once | INTRAVENOUS | Status: AC | PRN
  Administered 2022-02-04: 80 mL via INTRAVENOUS

## 2022-02-10 DIAGNOSIS — C642 Malignant neoplasm of left kidney, except renal pelvis: Secondary | ICD-10-CM | POA: Diagnosis not present

## 2022-02-10 DIAGNOSIS — N200 Calculus of kidney: Secondary | ICD-10-CM | POA: Diagnosis not present

## 2022-02-11 ENCOUNTER — Encounter: Payer: Self-pay | Admitting: Internal Medicine

## 2022-02-24 ENCOUNTER — Ambulatory Visit (INDEPENDENT_AMBULATORY_CARE_PROVIDER_SITE_OTHER): Payer: BC Managed Care – PPO | Admitting: Medical

## 2022-02-24 ENCOUNTER — Ambulatory Visit: Payer: Self-pay | Admitting: Surgery

## 2022-02-24 ENCOUNTER — Encounter: Payer: Self-pay | Admitting: Medical

## 2022-02-24 VITALS — BP 110/70 | HR 51 | Ht 77.0 in | Wt 226.0 lb

## 2022-02-24 DIAGNOSIS — Z7185 Encounter for immunization safety counseling: Secondary | ICD-10-CM | POA: Diagnosis not present

## 2022-02-24 DIAGNOSIS — N401 Enlarged prostate with lower urinary tract symptoms: Secondary | ICD-10-CM

## 2022-02-24 DIAGNOSIS — K429 Umbilical hernia without obstruction or gangrene: Secondary | ICD-10-CM | POA: Diagnosis not present

## 2022-02-24 DIAGNOSIS — Z23 Encounter for immunization: Secondary | ICD-10-CM

## 2022-02-24 DIAGNOSIS — Z13220 Encounter for screening for lipoid disorders: Secondary | ICD-10-CM | POA: Diagnosis not present

## 2022-02-24 DIAGNOSIS — Z0000 Encounter for general adult medical examination without abnormal findings: Secondary | ICD-10-CM | POA: Diagnosis not present

## 2022-02-24 DIAGNOSIS — R3911 Hesitancy of micturition: Secondary | ICD-10-CM | POA: Insufficient documentation

## 2022-02-24 DIAGNOSIS — Z85528 Personal history of other malignant neoplasm of kidney: Secondary | ICD-10-CM

## 2022-02-24 DIAGNOSIS — N529 Male erectile dysfunction, unspecified: Secondary | ICD-10-CM

## 2022-02-24 DIAGNOSIS — I359 Nonrheumatic aortic valve disorder, unspecified: Secondary | ICD-10-CM

## 2022-02-24 DIAGNOSIS — Z87442 Personal history of urinary calculi: Secondary | ICD-10-CM

## 2022-02-24 DIAGNOSIS — Z800 Family history of malignant neoplasm of digestive organs: Secondary | ICD-10-CM

## 2022-02-24 DIAGNOSIS — R161 Splenomegaly, not elsewhere classified: Secondary | ICD-10-CM

## 2022-02-24 DIAGNOSIS — K4020 Bilateral inguinal hernia, without obstruction or gangrene, not specified as recurrent: Secondary | ICD-10-CM | POA: Diagnosis not present

## 2022-02-24 DIAGNOSIS — M7022 Olecranon bursitis, left elbow: Secondary | ICD-10-CM | POA: Diagnosis not present

## 2022-02-24 DIAGNOSIS — Z8249 Family history of ischemic heart disease and other diseases of the circulatory system: Secondary | ICD-10-CM

## 2022-02-24 DIAGNOSIS — R3912 Poor urinary stream: Secondary | ICD-10-CM

## 2022-02-24 DIAGNOSIS — Z125 Encounter for screening for malignant neoplasm of prostate: Secondary | ICD-10-CM | POA: Diagnosis not present

## 2022-02-24 LAB — POCT URINALYSIS DIP (PROADVANTAGE DEVICE)
Bilirubin, UA: NEGATIVE
Blood, UA: NEGATIVE
Glucose, UA: NEGATIVE mg/dL
Ketones, POC UA: NEGATIVE mg/dL
Leukocytes, UA: NEGATIVE
Nitrite, UA: NEGATIVE
Protein Ur, POC: NEGATIVE mg/dL
Specific Gravity, Urine: 1.01
Urobilinogen, Ur: NEGATIVE
pH, UA: 6 (ref 5.0–8.0)

## 2022-02-24 MED ORDER — TADALAFIL 5 MG PO TABS: 5.0000 mg | ORAL_TABLET | Freq: Every day | ORAL | 0 refills | Status: DC

## 2022-02-24 NOTE — Progress Notes (Signed)
Subjective:   HPI  Mitchell Hughes is a 56 y.o. male who presents for Chief Complaint  Patient presents with   fasting cpe,     Fasting cpe, would like covid     Patient Care Team: Melaine Mcphee, Camelia Eng, PA-C as PCP - General (Family Medicine) Leandrew Koyanagi, MD as Attending Physician (Orthopedic Surgery) Alexis Frock, MD as Consulting Physician (Urology) Star Age, MD as Attending Physician (Neurology) Vogler, Michell Heinrich, DPM as Referring Physician (Podiatry) Armbruster, Carlota Raspberry, MD as Consulting Physician (Gastroenterology) Sees dentist Sees eye doctor   Concerns: Here for routine physical.  He sees general surgery today for my recent referral for hernia.  He saw orthopedic reason about his olecranon bursa issue and is having surgery on that soon  He is concerned that Sildenafil does not seem to work/sildenafil.   He recently was diagnosed with sleep apnea and is still pending trial of PAP per neurology  He just saw [facility] in follow-up from surgery and had labs drawn recently.    Reviewed their medical, surgical, family, social, medication, and allergy history and updated chart as appropriate.  Past Medical History:  Diagnosis Date   Arthritis    Erectile dysfunction    Family history of colon cancer    sister in 9s   Family history of heart disease 2011   baseline cardiac eval 2011 with Dr. Sallyanne Kuster   H/O echocardiogram 2015   Dr. Wynonia Lawman   H/O echocardiogram 02/2019   History of exercise stress test    04/2014 Dr. Wynonia Lawman, prior 2011 normal [place] treadmill stress test, Dr. Debara Pickett   History of kidney stones    LOW BACK PAIN, ACUTE 09/26/2009   OSA (obstructive sleep apnea) 01/2022   neurology eval   RENAL CALCULUS, RIGHT 09/28/2009   9.52m on CT   TOBACCO USE, QUIT 09/26/2009   Wears glasses     Past Surgical History:  Procedure Laterality Date   COLONOSCOPY  08/2017   tubular adenoma, repeat 5 years; Dr. SCarolina Cellar  FOOT SURGERY      bone spur, right; Dr. HMilinda Pointer  IR NEPHROSTOMY PLACEMENT RIGHT  04/14/2021   NEPHROLITHOTOMY Right 05/17/2021   Procedure: NEPHROLITHOTOMY PERCUTANEOUS insertion double j stent;  Surgeon: MAlexis Frock MD;  Location: [facility];  Service: Urology;  Laterality: Right;   ROBOT ASSISTED LAPAROSCOPIC NEPHRECTOMY Left 07/26/2021   Procedure: XI ROBOTIC ASSISTED LAPAROSCOPIC NEPHRECTOMY;  Surgeon: MAlexis Frock MD;  Location: [facility];  Service: Urology;  Laterality: Left;  3 HRS   WISDOM TOOTH EXTRACTION     WISDOM TOOTH EXTRACTION      Family History  Problem Relation Age of Onset   Thyroid disease Mother    Other Mother        brain disease, possibly dementia?   Gallstones Mother    Other Father        spinal cord injury/accident   Heart disease Father 728  Heart disease Sister 454      artificial valve   Colon cancer Sister    Sleep apnea Sister    Cancer Sister 562      colon   Sleep apnea Sister    Cancer Sister        pancreas   Pancreatic cancer Sister    Heart disease Sister    Heart disease Brother 617      MI   Diabetes Brother    Pancreatic cancer Brother    Heart disease  Brother 71       pacemaker   Cancer Brother        liver, formerly pancreas   Heart disease Other        parent, other relative   Colon cancer Other    Stroke Neg Hx    Hypertension Neg Hx    Rectal cancer Neg Hx    Stomach cancer Neg Hx      Current Outpatient Medications:    gabapentin (NEURONTIN) 300 MG capsule, TAKE 1 CAPSULE(300 MG) BY MOUTH AT BEDTIME, Disp: 30 capsule, Rfl: 2   tadalafil (CIALIS) 5 MG tablet, Take 1 tablet (5 mg total) by mouth daily., Disp: 90 tablet, Rfl: 0 No current facility-administered medications for this visit.  Facility-Administered Medications Ordered in Other Visits:    polyethylene glycol powder (GLYCOLAX/MIRALAX) container 255 g, 1 Container, Oral, Once, Alexis Frock, MD  No Known Allergies   Review of Systems Constitutional: -fever, -chills,  -sweats, -unexpected weight change, -decreased appetite, -fatigue Allergy: -sneezing, -itching, -congestion Dermatology: -changing moles, --rash, -lumps ENT: -runny nose, -ear pain, -sore throat, -hoarseness, -sinus pain, -teeth pain, - ringing in ears, -hearing loss, -nosebleeds Cardiology: -chest pain, -palpitations, -swelling, -difficulty breathing when lying flat, -waking up short of breath Respiratory: -cough, -shortness of breath, -difficulty breathing with exercise or exertion, -wheezing, -coughing up blood Gastroenterology: -abdominal pain, -nausea, -vomiting, -diarrhea, -constipation, -blood in stool, -changes in bowel movement, -difficulty swallowing or eating Hematology: -bleeding, -bruising  Musculoskeletal: -joint aches, -muscle aches, -joint swelling, -back pain, -neck pain, -cramping, -changes in gait Ophthalmology: denies vision changes, eye redness, itching, discharge Urology: -burning with urination, -difficulty urinating, -blood in urine, -urinary frequency, -urgency, -incontinence Neurology: -headache, -weakness, -tingling, -numbness, -memory loss, -falls, -dizziness Psychology: -depressed mood, -agitation, -sleep problems Male GU: no testicular mass, pain, no lymph nodes swollen, no swelling, no rash.     02/24/2022    8:37 AM 01/30/2022    2:40 PM 08/01/2021   10:12 AM 04/19/2021   11:42 AM 02/14/2021    9:08 AM  Depression screen PHQ 2/9  Decreased Interest 0 0 0 0 0  Down, Depressed, Hopeless 0 0 0 0 0  PHQ - 2 Score 0 0 0 0 0        Objective:  BP 110/70   Pulse (!) 51 Comment: works out  Ht '6\' 5"'$  (1.956 m)   Wt 226 lb (102.5 kg)   BMI 26.80 kg/m   General appearance: alert, no distress, WD/WN Skin: unremarkable HEENT: normocephalic, conjunctiva/corneas normal, sclerae anicteric, PERRLA, EOMi, nares patent, no discharge or erythema, pharynx normal Oral cavity: MMM, tongue normal, teeth normal Neck: supple, no lymphadenopathy, no thyromegaly, no masses,  normal ROM, no bruits Chest: non tender, normal shape and expansion Heart: RRR, normal S1, S2, no murmurs Lungs: CTA bilaterally, no wheezes, rhonchi, or rales Abdomen: +bs, soft, surgical scars vertical abdomen and several port scars, otherwise non tender, non distended, no masses, no hepatomegaly, no splenomegaly, no bruits Back: non tender, normal ROM, no scoliosis Musculoskeletal: upper extremities non tender, no obvious deformity, normal ROM throughout, lower extremities non tender, no obvious deformity, normal ROM throughout Extremities: no edema, no cyanosis, no clubbing Pulses: 2+ symmetric, upper and lower extremities, normal cap refill Neurological: alert, oriented x 3, CN2-12 intact, strength normal upper extremities and lower extremities, sensation normal throughout, DTRs 2+ throughout, no cerebellar signs, gait normal Psychiatric: normal affect, behavior normal, pleasant  GU/rectal - deferred   Assessment and Plan :   Encounter Diagnoses  Name  Primary?   Encounter for health maintenance examination in adult Yes   Vaccine counseling    Splenomegaly    Screening for prostate cancer    Erectile dysfunction, unspecified erectile dysfunction type    Family history of colon cancer    Family history of heart disease    Aortic valve calcification    COVID-19 vaccine administered    Urinary hesitancy    Screening for lipid disorders    Benign prostatic hyperplasia with weak urinary stream    History of renal stone    History of renal cell cancer     This visit was a preventative care visit, also known as wellness visit or routine physical.   Topics typically include healthy lifestyle, diet, exercise, preventative care, vaccinations, sick and well care, proper use of emergency dept and after hours care, as well as other concerns.     Recommendations: Continue to return yearly for your annual wellness and preventative care visits.  This gives [place] a chance to discuss healthy  lifestyle, exercise, vaccinations, review your chart record, and perform screenings where appropriate.  I recommend you see your eye doctor yearly for routine vision care.  I recommend you see your dentist yearly for routine dental care including hygiene visits twice yearly.   Vaccination recommendations were reviewed Immunization History  Administered Date(s) Administered   COVID-19, mRNA, vaccine(Comirnaty)12 years and older 02/24/2022   Influenza,inj,Quad PF,6+ Mos 12/21/2013, 02/17/2020, 02/14/2021, 01/30/2022   Influenza-Unspecified 03/16/2015, 03/05/2016, 02/02/2017   PFIZER(Purple Top)SARS-COV-2 Vaccination 07/17/2019, 08/08/2019   Tdap 12/21/2013   Zoster Recombinat (Shingrix) 05/19/2020, 02/21/2021   Counseled on the Covid virus vaccine.  Vaccine information sheet given.  Covid vaccine given after consent obtained.   Screening for cancer: Colon cancer screening: I reviewed your colonoscopy on file that is up to date from 2019  We discussed PSA, prostate exam, and prostate cancer screening risks/benefits.     Skin cancer screening: Check your skin regularly for new changes, growing lesions, or other lesions of concern Come in for evaluation if you have skin lesions of concern.  Lung cancer screening: If you have a greater than 20 pack year history of tobacco use, then you may qualify for lung cancer screening with a chest CT scan.   Please call your insurance company to inquire about coverage for this test.  We currently don't have screenings for other cancers besides breast, cervical, colon, and lung cancers.  If you have a strong family history of cancer or have other cancer screening concerns, please let me know.    Bone health: Get at least 150 minutes of aerobic exercise weekly Get weight bearing exercise at least once weekly Bone density test:  A bone density test is an imaging test that uses a type of X-ray to measure the amount of calcium and other minerals in  your bones. The test may be used to diagnose or screen you for a condition that causes weak or thin bones (osteoporosis), predict your risk for a broken bone (fracture), or determine how well your osteoporosis treatment is working. The bone density test is recommended for females 69 and older, or females or males <76 if certain risk factors such as thyroid disease, long term use of steroids such as for asthma or rheumatological issues, vitamin D deficiency, estrogen deficiency, family history of osteoporosis, self or family history of fragility fracture in first degree relative.    Heart health: Get at least 150 minutes of aerobic exercise weekly Limit alcohol It is  important to maintain a healthy blood pressure and healthy cholesterol numbers  Heart disease screening: Screening for heart disease includes screening for blood pressure, fasting lipids, glucose/diabetes screening, BMI height to weight ratio, reviewed of smoking status, physical activity, and diet.    Goals include blood pressure 120/80 or less, maintaining a healthy lipid/cholesterol profile, preventing diabetes or keeping diabetes numbers under good control, not smoking or using tobacco products, exercising most days per week or at least 150 minutes per week of exercise, and eating healthy variety of fruits and vegetables, healthy oils, and avoiding unhealthy food choices like fried food, fast food, high sugar and high cholesterol foods.    Other tests may possibly include EKG test, CT coronary calcium score, echocardiogram, exercise treadmill stress test.   2020 echocardiogram reviewed  IMPRESSIONS     1. Left ventricular ejection fraction, by visual estimation, is 60 to  65%. The left ventricle has normal function. Normal left ventricular size.  There is no left ventricular hypertrophy.   2. Global right ventricle has normal systolic function.The right  ventricular size is normal. No increase in right ventricular wall   thickness.   3. Left atrial size was normal.   4. Right atrial size was normal.   5. The mitral valve is normal in structure. No evidence of mitral valve  regurgitation. No evidence of mitral stenosis.   6. The tricuspid valve is normal in structure. Tricuspid valve  regurgitation was not visualized by color flow Doppler.   7. The aortic valve was not well visualized Aortic valve regurgitation is  mild by color flow Doppler. Mild aortic valve stenosis.   8. Not well visualized but likely tri leaflet Moderate calcification and  restricted leaflet motion.   9. The pulmonic valve was normal in structure. Pulmonic valve  regurgitation is not visualized by color flow Doppler.  10. Normal pulmonary artery systolic pressure.  11. The inferior vena cava is normal in size with greater than 50%  respiratory variability, suggesting right atrial pressure of 3 mmHg.      Medical care options: I recommend you continue to seek care here first for routine care.  We try really hard to have available appointments Monday through Friday daytime hours for sick visits, acute visits, and physicals.  Urgent care should be used for after hours and weekends for significant issues that cannot wait till the next day.  The emergency department should be used for significant potentially life-threatening emergencies.  The emergency department is expensive, can often have long wait times for less significant concerns, so try to utilize primary care, urgent care, or telemedicine when possible to avoid unnecessary trips to the emergency department.  Virtual visits and telemedicine have been introduced since the pandemic started in 2020, and can be convenient ways to receive medical care.  We offer virtual appointments as well to assist you in a variety of options to seek medical care.    Advanced Directives: I recommend you consider completing a [place] and Living Will.   These documents respect your  wishes and help alleviate burdens on your loved ones if you were to become terminally ill or be in a position to need those documents enforced.    You can complete Advanced Directives yourself, have them notarized, then have copies made for our office, for you and for anybody you feel should have them in safe keeping.  Or, you can have an attorney prepare these documents.   If you haven't updated  your Last Will and Testament in a while, it may be worthwhile having an attorney prepare these documents together and save on some costs.       Separate significant issues discussed: Sleep apnea -pending CPAP trial per neurology, reviewed/23 sleep study that was abnormal showing severe sleep apnea  Inguinal hernia -pending general surgery consult today  Erectile dysfunction -failed as needed sildenafil and tadalafil.  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  I reviewed his recent February 10, 2022 urology notes  History of right renal stone with obstruction and status post surgery for stone  He had recent PSA screening through urology in October 2023    Mitchell Hughes was seen today for fasting cpe, .  Diagnoses and all orders for this visit:  Encounter for health maintenance examination in adult -     POCT Urinalysis DIP (Proadvantage Device) -     Comprehensive metabolic panel -     CBC -     Lipid panel  Vaccine counseling  Splenomegaly  Screening for prostate cancer  Erectile dysfunction, unspecified erectile dysfunction type  Family history of colon cancer  Family history of heart disease  Aortic valve calcification  COVID-19 vaccine administered -     Pf[employer] Fall 2023 Covid-19 Vaccine 71yr and older  Urinary hesitancy  Screening for lipid disorders -     Lipid panel  Benign  prostatic hyperplasia with weak urinary stream  History of renal stone  History of renal cell cancer  Other orders -     tadalafil (CIALIS) 5 MG tablet; Take 1 tablet (5 mg total) by mouth daily.    Follow-up pending labs, yearly for physical

## 2022-02-24 NOTE — Patient Instructions (Addendum)
This visit was a preventative care visit, also known as wellness visit or routine physical.   Topics typically include healthy lifestyle, diet, exercise, preventative care, vaccinations, sick and well care, proper use of emergency dept and after hours care, as well as other concerns.     Recommendations: Continue to return yearly for your annual wellness and preventative care visits.  This gives [place] a chance to discuss healthy lifestyle, exercise, vaccinations, review your chart record, and perform screenings where appropriate.  I recommend you see your eye doctor yearly for routine vision care.  I recommend you see your dentist yearly for routine dental care including hygiene visits twice yearly.   Vaccination recommendations were reviewed Immunization History  Administered Date(s) Administered   COVID-19, mRNA, vaccine(Comirnaty)12 years and older 02/24/2022   Influenza,inj,Quad PF,6+ Mos 12/21/2013, 02/17/2020, 02/14/2021, 01/30/2022   Influenza-Unspecified 03/16/2015, 03/05/2016, 02/02/2017   PFIZER(Purple Top)SARS-COV-2 Vaccination 07/17/2019, 08/08/2019   Tdap 12/21/2013   Zoster Recombinat (Shingrix) 05/19/2020, 02/21/2021   Counseled on the Covid virus vaccine.  Vaccine information sheet given.  Covid vaccine given after consent obtained.   Screening for cancer: Colon cancer screening: I reviewed your colonoscopy on file that is up to date from 2019  We discussed PSA, prostate exam, and prostate cancer screening risks/benefits.     Skin cancer screening: Check your skin regularly for new changes, growing lesions, or other lesions of concern Come in for evaluation if you have skin lesions of concern.  Lung cancer screening: If you have a greater than 20 pack year history of tobacco use, then you may qualify for lung cancer screening with a chest CT scan.   Please call your insurance company to inquire about coverage for this test.  We currently don't have screenings for  other cancers besides breast, cervical, colon, and lung cancers.  If you have a strong family history of cancer or have other cancer screening concerns, please let me know.    Bone health: Get at least 150 minutes of aerobic exercise weekly Get weight bearing exercise at least once weekly Bone density test:  A bone density test is an imaging test that uses a type of X-ray to measure the amount of calcium and other minerals in your bones. The test may be used to diagnose or screen you for a condition that causes weak or thin bones (osteoporosis), predict your risk for a broken bone (fracture), or determine how well your osteoporosis treatment is working. The bone density test is recommended for females 65 and older, or females or males <77 if certain risk factors such as thyroid disease, long term use of steroids such as for asthma or rheumatological issues, vitamin D deficiency, estrogen deficiency, family history of osteoporosis, self or family history of fragility fracture in first degree relative.    Heart health: Get at least 150 minutes of aerobic exercise weekly Limit alcohol It is important to maintain a healthy blood pressure and healthy cholesterol numbers  Heart disease screening: Screening for heart disease includes screening for blood pressure, fasting lipids, glucose/diabetes screening, BMI height to weight ratio, reviewed of smoking status, physical activity, and diet.    Goals include blood pressure 120/80 or less, maintaining a healthy lipid/cholesterol profile, preventing diabetes or keeping diabetes numbers under good control, not smoking or using tobacco products, exercising most days per week or at least 150 minutes per week of exercise, and eating healthy variety of fruits and vegetables, healthy oils, and avoiding unhealthy food choices like fried food, fast food,  high sugar and high cholesterol foods.    Other tests may possibly include EKG test, CT coronary calcium  score, echocardiogram, exercise treadmill stress test.   2020 echocardiogram reviewed  IMPRESSIONS     1. Left ventricular ejection fraction, by visual estimation, is 60 to  65%. The left ventricle has normal function. Normal left ventricular size.  There is no left ventricular hypertrophy.   2. Global right ventricle has normal systolic function.The right  ventricular size is normal. No increase in right ventricular wall  thickness.   3. Left atrial size was normal.   4. Right atrial size was normal.   5. The mitral valve is normal in structure. No evidence of mitral valve  regurgitation. No evidence of mitral stenosis.   6. The tricuspid valve is normal in structure. Tricuspid valve  regurgitation was not visualized by color flow Doppler.   7. The aortic valve was not well visualized Aortic valve regurgitation is  mild by color flow Doppler. Mild aortic valve stenosis.   8. Not well visualized but likely tri leaflet Moderate calcification and  restricted leaflet motion.   9. The pulmonic valve was normal in structure. Pulmonic valve  regurgitation is not visualized by color flow Doppler.  10. Normal pulmonary artery systolic pressure.  11. The inferior vena cava is normal in size with greater than 50%  respiratory variability, suggesting right atrial pressure of 3 mmHg.      Medical care options: I recommend you continue to seek care here first for routine care.  We try really hard to have available appointments Monday through Friday daytime hours for sick visits, acute visits, and physicals.  Urgent care should be used for after hours and weekends for significant issues that cannot wait till the next day.  The emergency department should be used for significant potentially life-threatening emergencies.  The emergency department is expensive, can often have long wait times for less significant concerns, so try to utilize primary care, urgent care, or telemedicine when possible to  avoid unnecessary trips to the emergency department.  Virtual visits and telemedicine have been introduced since the pandemic started in 2020, and can be convenient ways to receive medical care.  We offer virtual appointments as well to assist you in a variety of options to seek medical care.    Advanced Directives: I recommend you consider completing a [place] and Living Will.   These documents respect your wishes and help alleviate burdens on your loved ones if you were to become terminally ill or be in a position to need those documents enforced.    You can complete Advanced Directives yourself, have them notarized, then have copies made for our office, for you and for anybody you feel should have them in safe keeping.  Or, you can have an attorney prepare these documents.   If you haven't updated your Last Will and Testament in a while, it may be worthwhile having an attorney prepare these documents together and save on some costs.       Separate significant issues discussed: Sleep apnea -pending CPAP trial per neurology, reviewed/23 sleep study that was abnormal showing severe sleep apnea  Inguinal hernia -pending general surgery consult today  Erectile dysfunction -failed as needed sildenafil and tadalafil.  We will begin trial of daily 5 mg for ED and BPH  Screening for lipids, consider statin given prior aortic valve calcification finding  History of left renal cancer, status post left radical nephrectomy and  node dissection March 2023.  I reviewed his recent February 10, 2022 urology notes  History of right renal stone with obstruction and status post surgery for stone  He had recent PSA screening with urology

## 2022-02-25 LAB — CBC
Hematocrit: 44.6 % (ref 37.5–51.0)
Hemoglobin: 15.4 g/dL (ref 13.0–17.7)
MCH: 30.6 pg (ref 26.6–33.0)
MCHC: 34.5 g/dL (ref 31.5–35.7)
MCV: 89 fL (ref 79–97)
Platelets: 224 10*3/uL (ref 150–450)
RBC: 5.03 x10E6/uL (ref 4.14–5.80)
RDW: 12.7 % (ref 11.6–15.4)
WBC: 4.7 10*3/uL (ref 3.4–10.8)

## 2022-02-25 LAB — COMPREHENSIVE METABOLIC PANEL
ALT: 20 IU/L (ref 0–44)
AST: 20 IU/L (ref 0–40)
Albumin/Globulin Ratio: 1.9 (ref 1.2–2.2)
Albumin: 4.8 g/dL (ref 3.8–4.9)
Alkaline Phosphatase: 63 IU/L (ref 44–121)
BUN/Creatinine Ratio: 10 (ref 9–20)
BUN: 18 mg/dL (ref 6–24)
Bilirubin Total: 0.7 mg/dL (ref 0.0–1.2)
CO2: 20 mmol/L (ref 20–29)
Calcium: 10 mg/dL (ref 8.7–10.2)
Chloride: 102 mmol/L (ref 96–106)
Creatinine, Ser: 1.73 mg/dL — ABNORMAL HIGH (ref 0.76–1.27)
Globulin, Total: 2.5 g/dL (ref 1.5–4.5)
Glucose: 105 mg/dL — ABNORMAL HIGH (ref 70–99)
Potassium: 4.5 mmol/L (ref 3.5–5.2)
Sodium: 138 mmol/L (ref 134–144)
Total Protein: 7.3 g/dL (ref 6.0–8.5)
eGFR: 45 mL/min/{1.73_m2} — ABNORMAL LOW (ref >59)

## 2022-02-25 LAB — LIPID PANEL
Chol/HDL Ratio: 4.5 ratio (ref 0.0–5.0)
Cholesterol, Total: 187 mg/dL (ref 100–199)
HDL: 42 mg/dL (ref >39)
LDL Chol Calc (NIH): 116 mg/dL — ABNORMAL HIGH (ref 0–99)
Triglycerides: 161 mg/dL — ABNORMAL HIGH (ref 0–149)
VLDL Cholesterol Cal: 29 mg/dL (ref 5–40)

## 2022-03-03 DIAGNOSIS — G4733 Obstructive sleep apnea (adult) (pediatric): Secondary | ICD-10-CM | POA: Diagnosis not present

## 2022-03-05 ENCOUNTER — Telehealth: Payer: Self-pay | Admitting: Orthopaedic Surgery

## 2022-03-05 NOTE — Telephone Encounter (Signed)
Pt called in stating that he have questions about his upcoming surgery. Pt stated that he is not sure if anyone mailed him out something about his surgery date and time.... Pt is requesting a callback.Marland KitchenMarland Kitchen

## 2022-03-06 ENCOUNTER — Encounter: Payer: BC Managed Care – PPO | Admitting: Orthopaedic Surgery

## 2022-03-06 NOTE — Telephone Encounter (Signed)
Left voice mail

## 2022-03-07 ENCOUNTER — Encounter: Payer: BC Managed Care – PPO | Admitting: Orthopaedic Surgery

## 2022-03-10 ENCOUNTER — Other Ambulatory Visit: Payer: Self-pay | Admitting: Physician Assistant

## 2022-03-10 MED ORDER — ONDANSETRON HCL 4 MG PO TABS: 4.0000 mg | ORAL_TABLET | Freq: Three times a day (TID) | ORAL | 0 refills | Status: DC | PRN

## 2022-03-10 MED ORDER — HYDROCODONE-ACETAMINOPHEN 5-325 MG PO TABS: 1.0000 | ORAL_TABLET | Freq: Four times a day (QID) | ORAL | 0 refills | Status: DC | PRN

## 2022-03-13 ENCOUNTER — Encounter: Payer: Self-pay | Admitting: Orthopaedic Surgery

## 2022-03-13 DIAGNOSIS — M7022 Olecranon bursitis, left elbow: Secondary | ICD-10-CM | POA: Diagnosis not present

## 2022-03-13 DIAGNOSIS — S46312A Strain of muscle, fascia and tendon of triceps, left arm, initial encounter: Secondary | ICD-10-CM | POA: Diagnosis not present

## 2022-03-13 DIAGNOSIS — M25722 Osteophyte, left elbow: Secondary | ICD-10-CM | POA: Diagnosis not present

## 2022-03-13 DIAGNOSIS — M25729 Osteophyte, unspecified elbow: Secondary | ICD-10-CM | POA: Insufficient documentation

## 2022-03-13 DIAGNOSIS — M778 Other enthesopathies, not elsewhere classified: Secondary | ICD-10-CM | POA: Diagnosis not present

## 2022-03-13 DIAGNOSIS — G8918 Other acute postprocedural pain: Secondary | ICD-10-CM | POA: Diagnosis not present

## 2022-03-14 ENCOUNTER — Telehealth: Payer: Self-pay | Admitting: Orthopaedic Surgery

## 2022-03-14 NOTE — Telephone Encounter (Signed)
[employer] forms received. To [employer].

## 2022-03-17 ENCOUNTER — Telehealth: Payer: Self-pay | Admitting: Orthopaedic Surgery

## 2022-03-17 NOTE — Telephone Encounter (Signed)
[employer] forms received. To [employer].

## 2022-03-20 ENCOUNTER — Ambulatory Visit (INDEPENDENT_AMBULATORY_CARE_PROVIDER_SITE_OTHER): Payer: BC Managed Care – PPO | Admitting: Physician Assistant

## 2022-03-20 ENCOUNTER — Encounter: Payer: Self-pay | Admitting: Orthopaedic Surgery

## 2022-03-20 DIAGNOSIS — M778 Other enthesopathies, not elsewhere classified: Secondary | ICD-10-CM | POA: Diagnosis not present

## 2022-03-20 MED ORDER — HYDROCODONE-ACETAMINOPHEN 5-325 MG PO TABS: 1.0000 | ORAL_TABLET | Freq: Two times a day (BID) | ORAL | 0 refills | Status: DC | PRN

## 2022-03-20 NOTE — Progress Notes (Signed)
Post-Op Visit Note   Patient: Mitchell Hughes           Date of Birth: [birth year]           MRN: [id number] Visit Date: 03/20/2022 PCP: Carlena Hurl, PA-C   Assessment & Plan:  Chief Complaint:  Chief Complaint  Patient presents with   Left Elbow - Follow-up    Left elbow olecranon bursa excision and triceps tendon repair 03/13/2022   Visit Diagnoses:  1. Tendinitis of left triceps     Plan: Patient is a pleasant 56 year old gentleman who comes in today 1 week status post left olecranon bursectomy and triceps reattachment, date of surgery 03/13/2022.  He has been doing well.  He has been in some pain which has been relieved with Norco.  He has been compliant wearing his sling.  Other issue he brings up today is right shoulder pain.  This initially began about 2 years ago for which she was seen by Dr. Ninfa Linden.  Cortisone injection helped until recently.  His symptoms have returned.  No new injury.  Pain is to the proximal deltoid worse with lifting his arm as well as with internal rotation.  No weakness.  Examination of the left elbow reveals a well-healing surgical incision with nylon sutures in place.  No evidence of infection or cellulitis.  He is neurovascular intact distally.  Right shoulder exam reveals forward flexion to approximately 170 degrees.  Near full external rotation.  Internal rotation to L5.  Positive empty can test.  Full strength.  He is neurovascular intact distally.  In regards to the right shoulder, we proceeded with subacromial cortisone injection today.  We will follow-up with [place] as needed for the right shoulder.  In regards to the left elbow, it is too early to remove the sutures.  We will have him come back next Tuesday for suture removal just prior to his trip to [place] for which he will be gone for a few weeks.  He may begin gentle range of motion exercises of the elbow but no pushoff exercises where he uses his triceps.  Anticipate out of work for 6 to 8 weeks  postop.  Call with concerns or questions in the meantime.  Follow-Up Instructions: Return in about 6 days (around 03/26/2022).   Orders:  No orders of the defined types were placed in this encounter.  Meds ordered this encounter  Medications   HYDROcodone-acetaminophen (NORCO) 5-325 MG tablet    Sig: Take 1 tablet by mouth 2 (two) times daily as needed.    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   Benign prostatic hyperplasia with weak urinary stream 02/24/2022   Urinary hesitancy 02/24/2022   COVID-19 vaccine administered 02/24/2022   Screening for lipid disorders 02/24/2022   History of renal cell cancer 02/24/2022   History of renal stone 02/24/2022   Tendinitis of left triceps 01/21/2022   Renal stone 05/17/2021   Sepsis with acute organ dysfunction ([place]) 04/19/2021   Kidney stone 04/19/2021   Renal mass 04/19/2021   Splenomegaly 04/19/2021   Sepsis ([place]) 04/14/2021   Acute lower UTI 04/14/2021   Bilateral hand numbness 02/14/2021   Localized swelling on right hand 02/14/2021   Sleep disturbance 02/14/2021   Snoring 02/14/2021   Erectile dysfunction 02/14/2021   Need for influenza vaccination 02/14/2021  Impingement syndrome of left shoulder 06/20/2020   Primary osteoarthritis of first carpometacarpal joint of left hand 06/20/2020   Encounter for health maintenance examination in adult 02/17/2020   Screening for prostate cancer 02/17/2020   History of COVID-19 02/17/2020   Toenail deformity 02/17/2020   Neuropathy of foot, 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 Sallyanne Kuster   H/O echocardiogram 2015   Dr. Wynonia Lawman   H/O echocardiogram 02/2019   History of exercise stress test    04/2014 Dr. Wynonia Lawman, prior 2011 normal [place] treadmill stress test, Dr. Debara Pickett   History of kidney stones    LOW BACK PAIN, ACUTE 09/26/2009   OSA (obstructive sleep apnea) 01/2022   neurology eval   RENAL CALCULUS, RIGHT 09/28/2009   9.53m on CT   TOBACCO USE, QUIT 09/26/2009   Wears glasses     Family History  Problem Relation Age of Onset   Thyroid disease Mother    Other Mother        brain disease, possibly dementia?   Gallstones Mother    Other Father        spinal cord injury/accident   Heart disease Father 775  Heart disease Sister 413      artificial valve   Colon cancer Sister    Sleep apnea Sister    Cancer Sister 510      colon   Sleep apnea Sister    Cancer Sister        pancreas   Pancreatic cancer Sister    Heart disease Sister    Heart disease Brother 658      MI   Diabetes Brother    Pancreatic cancer Brother    Heart disease Brother 511      pacemaker   Cancer Brother        liver, formerly pancreas   Heart disease Other        parent, other relative   Colon cancer Other    Stroke Neg Hx    Hypertension Neg Hx    Rectal cancer Neg Hx    Stomach cancer Neg Hx     Past Surgical History:  Procedure Laterality Date   COLONOSCOPY  08/2017   tubular adenoma, repeat 5 years; Dr. SCarolina Cellar  FOOT SURGERY     bone spur, right; Dr. HMilinda Pointer  IR NEPHROSTOMY PLACEMENT RIGHT  04/14/2021   NEPHROLITHOTOMY Right 05/17/2021   Procedure: NEPHROLITHOTOMY PERCUTANEOUS insertion double j stent;  Surgeon: MAlexis Frock MD;  Location: [facility];   Service: Urology;  Laterality: Right;   ROBOT ASSISTED LAPAROSCOPIC NEPHRECTOMY Left 07/26/2021   Procedure: XI ROBOTIC ASSISTED LAPAROSCOPIC NEPHRECTOMY;  Surgeon: MAlexis Frock MD;  Location: [facility];  Service: Urology;  Laterality: Left;  3 HRS   WISDOM TOOTH EXTRACTION     WISDOM TOOTH EXTRACTION     Social History   Occupational  History   Not on file  Tobacco Use   Smoking status: Former    Packs/day: 0.25    Years: 30.00    Total pack years: 7.50    Types: Cigarettes    Quit date: 03/05/2009    Years since quitting: 13.0   Smokeless tobacco: Never   Tobacco comments:    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

## 2022-03-24 NOTE — Patient Instructions (Signed)
SURGICAL WAITING ROOM VISITATION Patients having surgery or a procedure may have no more than 2 support people in the waiting area - these visitors may rotate.   Children under the age of 28 must have an adult with them who is not the patient. If the patient needs to stay at the hospital during part of their recovery, the visitor guidelines for inpatient rooms apply. Pre-op nurse will coordinate an appropriate time for 1 support person to accompany patient in pre-op.  This support person may not rotate.    Please refer to the [facility] website for the visitor guidelines for Inpatients (after your surgery is over and you are in a regular room).       Your procedure is scheduled on:  04/11/2022    Report to [facility] Main Entrance    Report to admitting at  [place] AM   Call this number if you have problems the morning of surgery [phone]   Do not eat food :After Midnight.   After Midnight you may have the following liquids until ___ 0430___ Am DAY OF SURGERY  Water Non-Citrus Juices (without pulp, NO RED) Carbonated Beverages Black Coffee (NO MILK/CREAM OR CREAMERS, sugar ok)  Clear Tea (NO MILK/CREAM OR CREAMERS, sugar ok) regular and decaf                             Plain Jell-O (NO RED)                                           Fruit ices (not with fruit pulp, NO RED)                                     Popsicles (NO RED)                                                               Sports drinks like Gatorade (NO RED)              Drink 2 Ensure/G2 drinks AT 10:00 PM the night before surgery.        The day of surgery:  Drink ONE (1) Pre-Surgery Clear Ensure or G2 at   0430AM ( have completed by )  the morning of surgery. Drink in one sitting. Do not sip.  This drink was given to you during your hospital  pre-op appointment visit. Nothing else to drink after completing the  Pre-Surgery Clear Ensure or G2.          If you have questions, please contact your  surgeon's office.      Oral Hygiene is also important to reduce your risk of infection.                                    Remember - BRUSH YOUR TEETH THE MORNING OF SURGERY WITH YOUR REGULAR TOOTHPASTE   Do NOT smoke after Midnight   Take these medicines the morning of surgery with  A SIP OF WATER:  none   DO NOT TAKE ANY ORAL DIABETIC MEDICATIONS DAY OF YOUR SURGERY  Bring CPAP mask and tubing day of surgery.                              You may not have any metal on your body including hair pins, jewelry, and body piercing             Do not wear make-up, lotions, powders, perfumes/cologne, or deodorant  Do not wear nail polish including gel and S&S, artificial/acrylic nails, or any other type of covering on natural nails including finger and toenails. If you have artificial nails, gel coating, etc. that needs to be removed by a nail salon please have this removed prior to surgery or surgery may need to be canceled/ delayed if the surgeon/ anesthesia feels like they are unable to be safely monitored.   Do not shave  48 hours prior to surgery.               Men may shave face and neck.   Do not bring valuables to the hospital. [place].   Contacts, dentures or bridgework may not be worn into surgery.   Bring small overnight bag day of surgery.   DO NOT [place]. PHARMACY WILL DISPENSE MEDICATIONS LISTED ON YOUR MEDICATION LIST TO YOU DURING YOUR ADMISSION [place]!    Patients discharged on the day of surgery will not be allowed to drive home.  Someone NEEDS to stay with you for the first 24 hours after anesthesia.   Special Instructions: Bring a copy of your healthcare power of attorney and living will documents the day of surgery if you haven't scanned them before.              Please read over the following fact sheets you were given: IF [place] [phone]   If you received a COVID test during your pre-op visit  it is requested that you wear a mask when out in public, stay away from anyone that may not be feeling well and notify your surgeon if you develop symptoms. If you test positive for Covid or have been in contact with anyone that has tested positive in the last 10 days please notify you surgeon.    [place] - Preparing for Surgery Before surgery, you can play an important role.  Because skin is not sterile, your skin needs to be as free of germs as possible.  You can reduce the number of germs on your skin by washing with CHG (chlorahexidine gluconate) soap before surgery.  CHG is an antiseptic cleaner which kills germs and bonds with the skin to continue killing germs even after washing. Please DO NOT use if you have an allergy to CHG or antibacterial soaps.  If your skin becomes reddened/irritated stop using the CHG and inform your nurse when you arrive at Short Stay. Do not shave (including legs and underarms) for at least 48 hours prior to the first CHG shower.  You may shave your face/neck. Please follow these instructions carefully:  1.  Shower with CHG Soap the night before surgery and the  morning of Surgery.  2.  If you choose to wash your  hair, wash your hair first as usual with your  normal  shampoo.  3.  After you shampoo, rinse your hair and body thoroughly to remove the  shampoo.                           4.  Use CHG as you would any other liquid soap.  You can apply chg directly  to the skin and wash                       Gently with a scrungie or clean washcloth.  5.  Apply the CHG Soap to your body ONLY FROM THE NECK DOWN.   Do not use on face/ open                           Wound or open sores. Avoid contact with eyes, ears mouth and genitals (private parts).                       Wash face,  Genitals (private parts) with your normal soap.             6.  Wash thoroughly, paying special  attention to the area where your surgery  will be performed.  7.  Thoroughly rinse your body with warm water from the neck down.  8.  DO NOT shower/wash with your normal soap after using and rinsing off  the CHG Soap.                9.  Pat yourself dry with a clean towel.            10.  Wear clean pajamas.            11.  Place clean sheets on your bed the night of your first shower and do not  sleep with pets. Day of Surgery : Do not apply any lotions/deodorants the morning of surgery.  Please wear clean clothes to the hospital/surgery center.  FAILURE TO FOLLOW THESE INSTRUCTIONS MAY RESULT IN THE CANCELLATION OF YOUR SURGERY PATIENT SIGNATURE_________________________________  NURSE SIGNATURE__________________________________  ________________________________________________________________________

## 2022-03-24 NOTE — Progress Notes (Signed)
Anesthesia Review:  PCP: Cardiologist : Chest x-ray : 02/10/22- 2 view  EKG : 10/01/21 .  Echo : 2020  Stress test: Cardiac Cath :  Activity level:  Sleep Study/ CPAP : Fasting Blood Sugar :      / Checks Blood Sugar -- times a day:   Blood Thinner/ Instructions /Last Dose: ASA / Instructions/ Last Dose :  Sleep Study- 01/14/22

## 2022-03-26 ENCOUNTER — Other Ambulatory Visit: Payer: Self-pay

## 2022-03-26 ENCOUNTER — Encounter (HOSPITAL_COMMUNITY): Payer: Self-pay

## 2022-03-26 ENCOUNTER — Encounter (HOSPITAL_COMMUNITY)
Admission: RE | Admit: 2022-03-26 | Discharge: 2022-03-26 | Disposition: A | Discharge status: Home / Self Care | Payer: BC Managed Care – PPO | Source: Ambulatory Visit | Attending: Surgery | Admitting: Surgery

## 2022-03-26 ENCOUNTER — Ambulatory Visit (INDEPENDENT_AMBULATORY_CARE_PROVIDER_SITE_OTHER): Payer: BC Managed Care – PPO | Admitting: Orthopaedic Surgery

## 2022-03-26 VITALS — BP 139/90 | HR 73 | Temp 98.4°F | Resp 16 | Ht 77.0 in | Wt 216.0 lb

## 2022-03-26 DIAGNOSIS — M25722 Osteophyte, left elbow: Secondary | ICD-10-CM

## 2022-03-26 DIAGNOSIS — M25729 Osteophyte, unspecified elbow: Secondary | ICD-10-CM

## 2022-03-26 DIAGNOSIS — Z01818 Encounter for other preprocedural examination: Secondary | ICD-10-CM | POA: Diagnosis not present

## 2022-03-26 LAB — CBC
HCT: 45.6 % (ref 39.0–52.0)
Hemoglobin: 15.6 g/dL (ref 13.0–17.0)
MCH: 30.5 pg (ref 26.0–34.0)
MCHC: 34.2 g/dL (ref 30.0–36.0)
MCV: 89.1 fL (ref 80.0–100.0)
Platelets: 268 10*3/uL (ref 150–400)
RBC: 5.12 MIL/uL (ref 4.22–5.81)
RDW: 12 % (ref 11.5–15.5)
WBC: 9.2 10*3/uL (ref 4.0–10.5)
nRBC: 0 % (ref 0.0–0.2)

## 2022-03-26 NOTE — Progress Notes (Signed)
Post-Op Visit Note   Patient: Mitchell Hughes           Date of Birth: [birth year]           MRN: [id number] Visit Date: 03/26/2022 PCP: Carlena Hurl, PA-C   Assessment & Plan:  Chief Complaint:  Chief Complaint  Patient presents with   Left Elbow - Routine Post Op   Visit Diagnoses:  1. Osteophyte of olecranon process     Plan: Mendell is a 2 weeks status post resection of left olecranon osteophyte and repair of triceps tendon.  He is doing well overall.  He is here for suture removal.  He has no real complaints.  Examination left elbow shows a healed surgical incision without any signs of infection.  Expected postoperative findings.  Slight tenderness to palpation.  His general range of motion is very good.  Sutures removed Steri-Strips applied.  He can continue to increase activity and use as tolerated.  He just needs to be careful with elbow extension especially against force but as long as he listens to his symptoms that he should be fine.  Wound care instructions reviewed.  I do not think he will need any formal PT since his range of motion is quite good.  Recheck in 4 weeks.  Follow-Up Instructions: Return in about 4 weeks (around 04/23/2022).   Orders:  No orders of the defined types were placed in this encounter.  No orders of the defined types were placed in this encounter.   Imaging: No results found.  PMFS History: Patient Active Problem List   Diagnosis Date Noted   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   Benign prostatic hyperplasia with weak urinary stream 02/24/2022   Urinary hesitancy 02/24/2022   COVID-19 vaccine administered 02/24/2022   Screening for lipid disorders 02/24/2022   History of renal cell cancer 02/24/2022   History of renal stone 02/24/2022   Tendinitis of left triceps 01/21/2022   Renal stone 05/17/2021   Sepsis with acute organ dysfunction ([place]) 04/19/2021   Kidney stone 04/19/2021   Renal mass 04/19/2021   Splenomegaly  04/19/2021   Sepsis ([place]) 04/14/2021   Acute lower UTI 04/14/2021   Bilateral hand numbness 02/14/2021   Localized swelling on right hand 02/14/2021   Sleep disturbance 02/14/2021   Snoring 02/14/2021   Erectile dysfunction 02/14/2021   Need for influenza vaccination 02/14/2021   Impingement syndrome of left shoulder 06/20/2020   Primary osteoarthritis of first carpometacarpal joint of left hand 06/20/2020   Encounter for health maintenance examination in adult 02/17/2020   Screening for prostate cancer 02/17/2020   History of COVID-19 02/17/2020   Toenail deformity 02/17/2020   Neuropathy of foot, right 02/17/2020   Chronic pain of both shoulders 04/22/2019   Decreased range of motion of shoulder 04/22/2019   Degenerative arthritis of thumb, left 04/22/2019   Right inguinal hernia 01/28/2019   Aortic valve calcification 01/28/2019   Vaccine counseling 06/22/2017   Family history of heart disease 12/21/2013   Family history of colon cancer 12/21/2013   Nephrolithiasis 09/28/2009   Past Medical History:  Diagnosis Date   Arthritis    Erectile dysfunction    Family history of colon cancer    sister in 64s   Family history of heart disease 2011   baseline cardiac eval 2011 with Dr. Sallyanne Kuster   H/O echocardiogram 2015   Dr. Wynonia Lawman   H/O echocardiogram 02/2019   History of exercise stress test    04/2014 Dr. Wynonia Lawman,  prior 2011 normal [facility] treadmill stress test, Dr. Debara Pickett   History of kidney stones    LOW BACK PAIN, ACUTE 09/26/2009   OSA (obstructive sleep apnea) 01/2022   has cpap   TOBACCO USE, QUIT 09/26/2009   Wears glasses     Family History  Problem Relation Age of Onset   Thyroid disease Mother    Other Mother        brain disease, possibly dementia?   Gallstones Mother    Other Father        spinal cord injury/accident   Heart disease Father 63   Heart disease Sister 66       artificial valve   Colon cancer Sister    Sleep apnea Sister    Cancer Sister 88        colon   Sleep apnea Sister    Cancer Sister        pancreas   Pancreatic cancer Sister    Heart disease Sister    Heart disease Brother 17       MI   Diabetes Brother    Pancreatic cancer Brother    Heart disease Brother 21       pacemaker   Cancer Brother        liver, formerly pancreas   Heart disease Other        parent, other relative   Colon cancer Other    Stroke Neg Hx    Hypertension Neg Hx    Rectal cancer Neg Hx    Stomach cancer Neg Hx     Past Surgical History:  Procedure Laterality Date   COLONOSCOPY  08/2017   tubular adenoma, repeat 5 years; Dr. Portia Cellar   FOOT SURGERY     bone spur, right; Dr. Milinda Pointer   IR NEPHROSTOMY PLACEMENT RIGHT  04/14/2021   NEPHROLITHOTOMY Right 05/17/2021   Procedure: NEPHROLITHOTOMY PERCUTANEOUS insertion double j stent;  Surgeon: Alexis Frock, MD;  Location: [facility];  Service: Urology;  Laterality: Right;   ROBOT ASSISTED LAPAROSCOPIC NEPHRECTOMY Left 07/26/2021   Procedure: XI ROBOTIC ASSISTED LAPAROSCOPIC NEPHRECTOMY;  Surgeon: Alexis Frock, MD;  Location: [facility];  Service: Urology;  Laterality: Left;  3 HRS   WISDOM TOOTH EXTRACTION     WISDOM TOOTH EXTRACTION     Social History   Occupational History   Not on file  Tobacco Use   Smoking status: Former    Packs/day: 0.25    Years: 30.00    Total pack years: 7.50    Types: Cigarettes    Quit date: 03/05/2009    Years since quitting: 13.0   Smokeless tobacco: Never   Tobacco comments:    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

## 2022-04-03 DIAGNOSIS — G4733 Obstructive sleep apnea (adult) (pediatric): Secondary | ICD-10-CM | POA: Diagnosis not present

## 2022-04-09 ENCOUNTER — Other Ambulatory Visit: Payer: Self-pay | Admitting: Medical

## 2022-04-09 ENCOUNTER — Telehealth: Payer: Self-pay | Admitting: Medical

## 2022-04-09 MED ORDER — TRAZODONE HCL 50 MG PO TABS: 50.0000 mg | ORAL_TABLET | Freq: Every day | ORAL | 0 refills | Status: DC

## 2022-04-09 NOTE — Telephone Encounter (Signed)
Pt called he is still having trouble sleeping, states he has discussed with you previously. He does use CPAP but still having trouble sleepong  wakes up and can not go back to sleep

## 2022-04-11 ENCOUNTER — Encounter (HOSPITAL_COMMUNITY): Payer: Self-pay | Admitting: Surgery

## 2022-04-11 ENCOUNTER — Ambulatory Visit (HOSPITAL_COMMUNITY)
Admission: RE | Admit: 2022-04-11 | Discharge: 2022-04-11 | Disposition: A | Discharge status: Home / Self Care | Payer: BC Managed Care – PPO | Source: Ambulatory Visit | Attending: Surgery | Admitting: Surgery

## 2022-04-11 ENCOUNTER — Encounter (HOSPITAL_COMMUNITY)
Admission: RE | Disposition: A | Discharge status: Home / Self Care | Payer: Self-pay | Source: Ambulatory Visit | Attending: Surgery

## 2022-04-11 ENCOUNTER — Ambulatory Visit (HOSPITAL_COMMUNITY): Payer: BC Managed Care – PPO | Admitting: Physician Assistant

## 2022-04-11 ENCOUNTER — Other Ambulatory Visit: Payer: Self-pay

## 2022-04-11 ENCOUNTER — Ambulatory Visit (HOSPITAL_COMMUNITY): Payer: BC Managed Care – PPO | Admitting: Anesthesiology

## 2022-04-11 DIAGNOSIS — Z01818 Encounter for other preprocedural examination: Secondary | ICD-10-CM

## 2022-04-11 DIAGNOSIS — K4020 Bilateral inguinal hernia, without obstruction or gangrene, not specified as recurrent: Secondary | ICD-10-CM | POA: Diagnosis not present

## 2022-04-11 DIAGNOSIS — Z87891 Personal history of nicotine dependence: Secondary | ICD-10-CM | POA: Insufficient documentation

## 2022-04-11 DIAGNOSIS — G4730 Sleep apnea, unspecified: Secondary | ICD-10-CM | POA: Diagnosis not present

## 2022-04-11 DIAGNOSIS — K429 Umbilical hernia without obstruction or gangrene: Secondary | ICD-10-CM | POA: Diagnosis not present

## 2022-04-11 DIAGNOSIS — Z905 Acquired absence of kidney: Secondary | ICD-10-CM | POA: Insufficient documentation

## 2022-04-11 DIAGNOSIS — Z85528 Personal history of other malignant neoplasm of kidney: Secondary | ICD-10-CM | POA: Insufficient documentation

## 2022-04-11 HISTORY — PX: UMBILICAL HERNIA REPAIR: SHX196

## 2022-04-11 HISTORY — PX: INGUINAL HERNIA REPAIR: SHX194

## 2022-04-11 SURGERY — REPAIR, HERNIA, INGUINAL, BILATERAL, LAPAROSCOPIC
Anesthesia: General

## 2022-04-11 MED ORDER — ENSURE PRE-SURGERY PO LIQD: 296.0000 mL | Freq: Once | ORAL | Status: DC

## 2022-04-11 MED ORDER — DEXAMETHASONE SODIUM PHOSPHATE 10 MG/ML IJ SOLN
INTRAMUSCULAR | Status: AC
  Filled 2022-04-11: qty 1

## 2022-04-11 MED ORDER — 0.9 % SODIUM CHLORIDE (POUR BTL) OPTIME
TOPICAL | Status: DC | PRN
  Administered 2022-04-11: 1000 mL

## 2022-04-11 MED ORDER — DEXAMETHASONE SODIUM PHOSPHATE 10 MG/ML IJ SOLN
INTRAMUSCULAR | Status: DC | PRN
  Administered 2022-04-11: 5 mg via INTRAVENOUS

## 2022-04-11 MED ORDER — SODIUM CHLORIDE (PF) 0.9 % IJ SOLN
INTRAMUSCULAR | Status: DC | PRN
  Administered 2022-04-11: 30 mL

## 2022-04-11 MED ORDER — BUPIVACAINE LIPOSOME 1.3 % IJ SUSP
INTRAMUSCULAR | Status: AC
  Filled 2022-04-11: qty 20

## 2022-04-11 MED ORDER — LIDOCAINE 20MG/ML (2%) 15 ML SYRINGE OPTIME
INTRAMUSCULAR | Status: DC | PRN
  Administered 2022-04-11: 1.5 mg/kg/h via INTRAVENOUS

## 2022-04-11 MED ORDER — KETAMINE HCL 50 MG/5ML IJ SOSY
PREFILLED_SYRINGE | INTRAMUSCULAR | Status: AC
  Filled 2022-04-11: qty 5

## 2022-04-11 MED ORDER — FENTANYL CITRATE (PF) 250 MCG/5ML IJ SOLN
INTRAMUSCULAR | Status: AC
  Filled 2022-04-11: qty 5

## 2022-04-11 MED ORDER — LIDOCAINE HCL (PF) 2 % IJ SOLN
INTRAMUSCULAR | Status: AC
  Filled 2022-04-11: qty 10

## 2022-04-11 MED ORDER — LIDOCAINE HCL (PF) 2 % IJ SOLN
INTRAMUSCULAR | Status: AC
  Filled 2022-04-11: qty 5

## 2022-04-11 MED ORDER — ORAL CARE MOUTH RINSE: 15.0000 mL | Freq: Once | OROMUCOSAL | Status: AC

## 2022-04-11 MED ORDER — OXYCODONE HCL 5 MG PO TABS
5.0000 mg | ORAL_TABLET | Freq: Once | ORAL | Status: AC | PRN
  Administered 2022-04-11: 5 mg via ORAL

## 2022-04-11 MED ORDER — OXYCODONE HCL 5 MG/5ML PO SOLN: 5.0000 mg | Freq: Once | ORAL | Status: AC | PRN

## 2022-04-11 MED ORDER — OXYCODONE HCL 5 MG PO TABS
ORAL_TABLET | ORAL | Status: AC
  Filled 2022-04-11: qty 1

## 2022-04-11 MED ORDER — FENTANYL CITRATE (PF) 250 MCG/5ML IJ SOLN
INTRAMUSCULAR | Status: DC | PRN
  Administered 2022-04-11 (×3): 50 ug via INTRAVENOUS
  Administered 2022-04-11: 100 ug via INTRAVENOUS

## 2022-04-11 MED ORDER — ONDANSETRON HCL 4 MG/2ML IJ SOLN
INTRAMUSCULAR | Status: AC
  Filled 2022-04-11: qty 2

## 2022-04-11 MED ORDER — BUPIVACAINE-EPINEPHRINE (PF) 0.5% -1:200000 IJ SOLN
INTRAMUSCULAR | Status: DC | PRN
  Administered 2022-04-11: 30 mL

## 2022-04-11 MED ORDER — KETAMINE HCL 10 MG/ML IJ SOLN
INTRAMUSCULAR | Status: DC | PRN
  Administered 2022-04-11: 45 mg via INTRAVENOUS

## 2022-04-11 MED ORDER — CHLORHEXIDINE GLUCONATE 0.12 % MT SOLN
15.0000 mL | Freq: Once | OROMUCOSAL | Status: AC
  Administered 2022-04-11: 15 mL via OROMUCOSAL

## 2022-04-11 MED ORDER — ONDANSETRON HCL 4 MG/2ML IJ SOLN
INTRAMUSCULAR | Status: DC | PRN
  Administered 2022-04-11: 4 mg via INTRAVENOUS

## 2022-04-11 MED ORDER — HYDROCODONE-ACETAMINOPHEN 5-325 MG PO TABS: 1.0000 | ORAL_TABLET | Freq: Four times a day (QID) | ORAL | 0 refills | Status: AC | PRN

## 2022-04-11 MED ORDER — SODIUM CHLORIDE (PF) 0.9 % IJ SOLN
INTRAMUSCULAR | Status: AC
  Filled 2022-04-11: qty 30

## 2022-04-11 MED ORDER — BUPIVACAINE HCL 0.25 % IJ SOLN
INTRAMUSCULAR | Status: AC
  Filled 2022-04-11: qty 1

## 2022-04-11 MED ORDER — PROPOFOL 10 MG/ML IV BOLUS
INTRAVENOUS | Status: AC
  Filled 2022-04-11: qty 20

## 2022-04-11 MED ORDER — ROCURONIUM BROMIDE 10 MG/ML (PF) SYRINGE
PREFILLED_SYRINGE | INTRAVENOUS | Status: DC | PRN
  Administered 2022-04-11 (×2): 20 mg via INTRAVENOUS
  Administered 2022-04-11: 60 mg via INTRAVENOUS

## 2022-04-11 MED ORDER — SUGAMMADEX SODIUM 200 MG/2ML IV SOLN
INTRAVENOUS | Status: DC | PRN
  Administered 2022-04-11: 200 mg via INTRAVENOUS

## 2022-04-11 MED ORDER — ONDANSETRON HCL 4 MG/2ML IJ SOLN: 4.0000 mg | Freq: Four times a day (QID) | INTRAMUSCULAR | Status: DC | PRN

## 2022-04-11 MED ORDER — FENTANYL CITRATE PF 50 MCG/ML IJ SOSY
25.0000 ug | PREFILLED_SYRINGE | INTRAMUSCULAR | Status: DC | PRN
  Administered 2022-04-11 (×2): 25 ug via INTRAVENOUS

## 2022-04-11 MED ORDER — CHLORHEXIDINE GLUCONATE CLOTH 2 % EX PADS: 6.0000 | MEDICATED_PAD | Freq: Once | CUTANEOUS | Status: DC

## 2022-04-11 MED ORDER — FENTANYL CITRATE PF 50 MCG/ML IJ SOSY
PREFILLED_SYRINGE | INTRAMUSCULAR | Status: AC
  Filled 2022-04-11: qty 2

## 2022-04-11 MED ORDER — MIDAZOLAM HCL 2 MG/2ML IJ SOLN
INTRAMUSCULAR | Status: DC | PRN
  Administered 2022-04-11: 2 mg via INTRAVENOUS

## 2022-04-11 MED ORDER — LACTATED RINGERS IV SOLN: INTRAVENOUS | Status: DC

## 2022-04-11 MED ORDER — BUPIVACAINE LIPOSOME 1.3 % IJ SUSP
INTRAMUSCULAR | Status: DC | PRN
  Administered 2022-04-11: 20 mL

## 2022-04-11 MED ORDER — BUPIVACAINE-EPINEPHRINE (PF) 0.5% -1:200000 IJ SOLN
INTRAMUSCULAR | Status: AC
  Filled 2022-04-11: qty 60

## 2022-04-11 MED ORDER — PHENYLEPHRINE 80 MCG/ML (10ML) SYRINGE FOR IV PUSH (FOR BLOOD PRESSURE SUPPORT)
PREFILLED_SYRINGE | INTRAVENOUS | Status: AC
  Filled 2022-04-11: qty 10

## 2022-04-11 MED ORDER — LIDOCAINE 2% (20 MG/ML) 5 ML SYRINGE
INTRAMUSCULAR | Status: DC | PRN
  Administered 2022-04-11: 60 mg via INTRAVENOUS

## 2022-04-11 MED ORDER — ROCURONIUM BROMIDE 10 MG/ML (PF) SYRINGE
PREFILLED_SYRINGE | INTRAVENOUS | Status: AC
  Filled 2022-04-11: qty 10

## 2022-04-11 MED ORDER — BUPIVACAINE LIPOSOME 1.3 % IJ SUSP: 20.0000 mL | Freq: Once | INTRAMUSCULAR | Status: DC

## 2022-04-11 MED ORDER — ACETAMINOPHEN 500 MG PO TABS
1000.0000 mg | ORAL_TABLET | ORAL | Status: AC
  Administered 2022-04-11: 1000 mg via ORAL
  Filled 2022-04-11: qty 2

## 2022-04-11 MED ORDER — CEFAZOLIN SODIUM-DEXTROSE 2-4 GM/100ML-% IV SOLN
2.0000 g | INTRAVENOUS | Status: AC
  Administered 2022-04-11: 2 g via INTRAVENOUS
  Filled 2022-04-11: qty 100

## 2022-04-11 MED ORDER — GABAPENTIN 300 MG PO CAPS
300.0000 mg | ORAL_CAPSULE | ORAL | Status: AC
  Administered 2022-04-11: 300 mg via ORAL
  Filled 2022-04-11: qty 1

## 2022-04-11 MED ORDER — PHENYLEPHRINE 80 MCG/ML (10ML) SYRINGE FOR IV PUSH (FOR BLOOD PRESSURE SUPPORT)
PREFILLED_SYRINGE | INTRAVENOUS | Status: DC | PRN
  Administered 2022-04-11 (×2): 80 ug via INTRAVENOUS

## 2022-04-11 MED ORDER — MIDAZOLAM HCL 2 MG/2ML IJ SOLN
INTRAMUSCULAR | Status: AC
  Filled 2022-04-11: qty 2

## 2022-04-11 MED ORDER — PROPOFOL 10 MG/ML IV BOLUS
INTRAVENOUS | Status: DC | PRN
  Administered 2022-04-11: 200 mg via INTRAVENOUS

## 2022-04-11 SURGICAL SUPPLY — 41 items
BAG COUNTER SPONGE SURGICOUNT (BAG) IMPLANT
CABLE HIGH FREQUENCY MONO STRZ (ELECTRODE) ×2 IMPLANT
CHLORAPREP W/TINT 26 (MISCELLANEOUS) ×2 IMPLANT
COVER SURGICAL LIGHT HANDLE (MISCELLANEOUS) ×2 IMPLANT
DEVICE SECURE STRAP 25 ABSORB (INSTRUMENTS) IMPLANT
DRAPE WARM FLUID 44X44 (DRAPES) ×2 IMPLANT
DRSG TEGADERM 2-3/8X2-3/4 SM (GAUZE/BANDAGES/DRESSINGS) ×4 IMPLANT
DRSG TEGADERM 4X4.75 (GAUZE/BANDAGES/DRESSINGS) ×2 IMPLANT
ELECT REM PT RETURN 15FT ADLT (MISCELLANEOUS) ×2 IMPLANT
GAUZE SPONGE 2X2 8PLY STRL LF (GAUZE/BANDAGES/DRESSINGS) ×2 IMPLANT
GLOVE ECLIPSE 8.0 STRL XLNG CF (GLOVE) ×2 IMPLANT
GLOVE INDICATOR 8.0 STRL GRN (GLOVE) ×2 IMPLANT
GOWN STRL REUS W/ TWL XL LVL3 (GOWN DISPOSABLE) ×6 IMPLANT
GOWN STRL REUS W/TWL XL LVL3 (GOWN DISPOSABLE) ×6
IRRIG SUCT STRYKERFLOW 2 WTIP (MISCELLANEOUS)
IRRIGATION SUCT STRKRFLW 2 WTP (MISCELLANEOUS) IMPLANT
KIT BASIN OR (CUSTOM PROCEDURE TRAY) ×2 IMPLANT
KIT TURNOVER KIT A (KITS) IMPLANT
MARKER SKIN DUAL TIP RULER LAB (MISCELLANEOUS) ×2 IMPLANT
MESH BARD SOFT 6X6IN (Mesh General) IMPLANT
MESH HERNIA 6X6 BARD (Mesh General) IMPLANT
NDL INSUFFLATION 14GA 120MM (NEEDLE) IMPLANT
NEEDLE INSUFFLATION 14GA 120MM (NEEDLE) IMPLANT
PAD POSITIONING PINK XL (MISCELLANEOUS) ×2 IMPLANT
SCISSORS LAP 5X35 DISP (ENDOMECHANICALS) ×2 IMPLANT
SET TUBE SMOKE EVAC HIGH FLOW (TUBING) ×2 IMPLANT
SLEEVE ADV FIXATION 5X100MM (TROCAR) ×2 IMPLANT
SPIKE FLUID TRANSFER (MISCELLANEOUS) ×2 IMPLANT
SUT MNCRL AB 4-0 PS2 18 (SUTURE) ×2 IMPLANT
SUT PDS AB 1 CT1 27 (SUTURE) ×4 IMPLANT
SUT VIC AB 2-0 SH 27 (SUTURE)
SUT VIC AB 2-0 SH 27X BRD (SUTURE) IMPLANT
SUT VICRYL 0 UR6 27IN ABS (SUTURE) ×2 IMPLANT
SUT VLOC BARB 180 ABS3/0GR12 (SUTURE) ×2
SUTURE VLOC BRB 180 ABS3/0GR12 (SUTURE) IMPLANT
TOWEL OR 17X26 10 PK STRL BLUE (TOWEL DISPOSABLE) ×2 IMPLANT
TOWEL OR NON WOVEN STRL DISP B (DISPOSABLE) ×2 IMPLANT
TRAY FOLEY MTR SLVR 16FR STAT (SET/KITS/TRAYS/PACK) IMPLANT
TRAY LAPAROSCOPIC (CUSTOM PROCEDURE TRAY) ×2 IMPLANT
TROCAR ADV FIXATION 5X100MM (TROCAR) ×2 IMPLANT
TROCAR BALLN 12MMX100 BLUNT (TROCAR) ×2 IMPLANT

## 2022-04-11 NOTE — Discharge Instructions (Signed)
HERNIA REPAIR: POST OP INSTRUCTIONS  ######################################################################  EAT Gradually transition to a high fiber diet with a fiber supplement over the next few weeks after discharge.  Start with a pureed / full liquid diet (see below)  WALK Walk an hour a day.  Control your pain to do that.    CONTROL PAIN Control pain so that you can walk, sleep, tolerate sneezing/coughing, and go up/down stairs.  HAVE A BOWEL MOVEMENT DAILY Keep your bowels regular to avoid problems.  OK to try a laxative to override constipation.  OK to use an antidairrheal to slow down diarrhea.  Call if not better after 2 tries  CALL IF YOU HAVE PROBLEMS/CONCERNS Call if you are still struggling despite following these instructions. Call if you have concerns not answered by these instructions  ######################################################################    DIET: Follow a light bland diet & liquids the first 24 hours after arrival home, such as soup, liquids, starches, etc.  Be sure to drink plenty of fluids.  Quickly advance to a usual solid diet within a few days.  Avoid fast food or heavy meals as your are more likely to get nauseated or have irregular bowels.  A low-fat, high-fiber diet for the rest of your life is ideal.   Take your usually prescribed home medications unless otherwise directed.  PAIN CONTROL: Pain is best controlled by a usual combination of three different methods TOGETHER: Ice/Heat Over the counter pain medication Prescription pain medication Most patients will experience some swelling and bruising around the hernia(s) such as the bellybutton, groins, or old incisions.  Ice packs or heating pads (30-60 minutes up to 6 times a day) will help. Use ice for the first few days to help decrease swelling and bruising, then switch to heat to help relax tight/sore spots and speed recovery.  Some people prefer to use ice alone, heat alone, alternating  between ice & heat.  Experiment to what works for you.  Swelling and bruising can take several weeks to resolve.   It is helpful to take an over-the-counter pain medication regularly for the first few weeks.  Choose one of the following that works best for you: Naproxen (Aleve, etc)  Two 220mg tabs twice a day Ibuprofen (Advil, etc) Three 200mg tabs four times a day (every meal & bedtime) Acetaminophen (Tylenol, etc) 325-650mg four times a day (every meal & bedtime) A  prescription for pain medication should be given to you upon discharge.  Take your pain medication as prescribed.  If you are having problems/concerns with the prescription medicine (does not control pain, nausea, vomiting, rash, itching, etc), please call us [phone] to see if we need to switch you to a different pain medicine that will work better for you and/or control your side effect better. If you need a refill on your pain medication, please contact your pharmacy.  They will contact our office to request authorization. Prescriptions will not be filled after 5 pm or on week-ends.  Avoid getting constipated.  Between the surgery and the pain medications, it is common to experience some constipation.  Increasing fluid intake and taking a fiber supplement (such as Metamucil, Citrucel, FiberCon, MiraLax, etc) 1-2 times a day regularly will usually help prevent this problem from occurring.  A mild laxative (prune juice, Milk of Magnesia, MiraLax, etc) should be taken according to package directions if there are no bowel movements after 48 hours.    Wash / shower every day.  You may shower over the dressings   as they are waterproof.    It is good for closed incisions and even open wounds to be washed every day.  Shower every day.  Short baths are fine.  Wash the incisions and wounds clean with soap & water.    You may leave closed incisions open to air if it is dry.   You may cover the incision with clean gauze & replace it after  your daily shower for comfort.  TEGADERM:  You have clear gauze band-aid dressings over your closed incision(s).  Remove the dressings 3 days after surgery.    ACTIVITIES as tolerated:   You may resume regular (light) daily activities beginning the next day--such as daily self-care, walking, climbing stairs--gradually increasing activities as tolerated.  Control your pain so that you can walk an hour a day.  If you can walk 30 minutes without difficulty, it is safe to try more intense activity such as jogging, treadmill, bicycling, low-impact aerobics, swimming, etc. Save the most intensive and strenuous activity for last such as sit-ups, heavy lifting, contact sports, etc  Refrain from any heavy lifting or straining until you are off narcotics for pain control.   DO NOT PUSH THROUGH PAIN.  Let pain be your guide: If it hurts to do something, don't do it.  Pain is your body warning you to avoid that activity for another week until the pain goes down. You may drive when you are no longer taking prescription pain medication, you can comfortably wear a seatbelt, and you can safely maneuver your car and apply brakes. You may have sexual intercourse when it is comfortable.   FOLLOW UP in our office Please call [facility] at [phone] to set up an appointment to see your surgeon in the office for a follow-up appointment approximately 2-3 weeks after your surgery. Make sure that you call for this appointment the day you arrive home to insure a convenient appointment time.  9.  If you have disability of FMLA / Family leave forms, please bring the forms to the office for processing.  (do not give to your surgeon).  WHEN TO CALL US [phone]: Poor pain control Reactions / problems with new medications (rash/itching, nausea, etc)  Fever over 101.5 F (38.5 C) Inability to urinate Nausea and/or vomiting Worsening swelling or bruising Continued bleeding from incision. Increased pain, redness, or  drainage from the incision   The clinic staff is available to answer your questions during regular business hours (8:30am-5pm).  Please don't hesitate to call and ask to speak to one of our nurses for clinical concerns.   If you have a medical emergency, go to the nearest emergency room or call 911.  A surgeon from [facility] is always on call at the hospitals in [place]  [facility] [address], [address], [place], [place]  [address] ?  [address] [address], [place], [place]   [address] MAIN: [phone] ? TOLL FREE: [phone] ? FAX: [phone] [email or web address]  

## 2022-04-11 NOTE — Interval H&P Note (Signed)
History and Physical Interval Note:  04/11/2022 6:45 AM  Mitchell Hughes  has presented today for surgery, with the diagnosis of BILATERAL INGUINAL HERNIAS. UMBILICAL HERNIA.  The various methods of treatment have been discussed with the patient and family. After consideration of risks, benefits and other options for treatment, the patient has consented to  Procedure(s) with comments: [place] (Bilateral) - LOCAL HERNIA REPAIR UMBILICAL ADULT (N/A) as a surgical intervention.  The patient's history has been reviewed, patient examined, no change in status, stable for surgery.  I have reviewed the patient's chart and labs.  Questions were answered to the patient's satisfaction.    I have re-reviewed the the patient's records, history, medications, and allergies.  I have re-examined the patient.  I again discussed intraoperative plans and goals of post-operative recovery.  The patient agrees to proceed.  REICE BIENVENUE  [birth year] [id number]  Patient Care Team: Caryl Ada as PCP - General (Family Medicine) Leandrew Koyanagi, MD as Attending Physician (Orthopedic Surgery) Alexis Frock, MD as Consulting Physician (Urology) Star Age, MD as Attending Physician (Neurology) Vogler, Michell Heinrich, DPM as Referring Physician (Podiatry) Armbruster, Carlota Raspberry, MD as Consulting Physician (Gastroenterology)  Patient Active Problem List   Diagnosis Date Noted   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   Benign prostatic hyperplasia with weak urinary stream 02/24/2022   Urinary hesitancy 02/24/2022   COVID-19 vaccine administered 02/24/2022   Screening for lipid disorders 02/24/2022   History of renal cell cancer 02/24/2022   History of renal stone 02/24/2022   Tendinitis of left triceps 01/21/2022   Renal stone 05/17/2021   Sepsis with acute organ dysfunction ([place]) 04/19/2021   Kidney stone 04/19/2021   Renal mass 04/19/2021   Splenomegaly 04/19/2021    Sepsis ([place]) 04/14/2021   Acute lower UTI 04/14/2021   Bilateral hand numbness 02/14/2021   Localized swelling on right hand 02/14/2021   Sleep disturbance 02/14/2021   Snoring 02/14/2021   Erectile dysfunction 02/14/2021   Need for influenza vaccination 02/14/2021   Impingement syndrome of left shoulder 06/20/2020   Primary osteoarthritis of first carpometacarpal joint of left hand 06/20/2020   Encounter for health maintenance examination in adult 02/17/2020   Screening for prostate cancer 02/17/2020   History of COVID-19 02/17/2020   Toenail deformity 02/17/2020   Neuropathy of foot, right 02/17/2020   Chronic pain of both shoulders 04/22/2019   Decreased range of motion of shoulder 04/22/2019   Degenerative arthritis of thumb, left 04/22/2019   Right inguinal hernia 01/28/2019   Aortic valve calcification 01/28/2019   Vaccine counseling 06/22/2017   Family history of heart disease 12/21/2013   Family history of colon cancer 12/21/2013   Nephrolithiasis 09/28/2009    Past Medical History:  Diagnosis Date   Arthritis    Erectile dysfunction    Family history of colon cancer    sister in 33s   Family history of heart disease 2011   baseline cardiac eval 2011 with Dr. Sallyanne Kuster   H/O echocardiogram 2015   Dr. Wynonia Lawman   H/O echocardiogram 02/2019   History of exercise stress test    04/2014 Dr. Wynonia Lawman, prior 2011 normal [facility] treadmill stress test, Dr. Debara Pickett   History of kidney stones    LOW BACK PAIN, ACUTE 09/26/2009   OSA (obstructive sleep apnea) 01/2022   has cpap   TOBACCO USE, QUIT 09/26/2009   Wears glasses     Past Surgical History:  Procedure Laterality Date   COLONOSCOPY  08/2017   tubular adenoma, repeat 5 years; Dr. [place] Cellar   FOOT SURGERY     bone spur, right; Dr. Milinda Pointer   IR NEPHROSTOMY PLACEMENT RIGHT  04/14/2021   NEPHROLITHOTOMY Right 05/17/2021   Procedure: NEPHROLITHOTOMY PERCUTANEOUS insertion double j stent;  Surgeon: Alexis Frock,  MD;  Location: [facility];  Service: Urology;  Laterality: Right;   ROBOT ASSISTED LAPAROSCOPIC NEPHRECTOMY Left 07/26/2021   Procedure: XI ROBOTIC ASSISTED LAPAROSCOPIC NEPHRECTOMY;  Surgeon: Alexis Frock, MD;  Location: [facility];  Service: Urology;  Laterality: Left;  3 HRS   WISDOM TOOTH EXTRACTION     WISDOM TOOTH EXTRACTION      Social History   Socioeconomic History   Marital status: Single    Spouse name: Not on file   Number of children: Not on file   Years of education: Not on file   Highest education level: Not on file  Occupational History   Not on file  Tobacco Use   Smoking status: Former    Packs/day: 0.25    Years: 30.00    Total pack years: 7.50    Types: Cigarettes    Quit date: 03/05/2009    Years since quitting: 13.1   Smokeless tobacco: Never   Tobacco comments:    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  01/2019   Social Determinants of Health   Financial Resource Strain: Not on file  Food Insecurity: Not on file  Transportation Needs: Not on file  Physical Activity: Not on file  Stress: Not on file  Social Connections: Not on file  Intimate Partner Violence: Not on file    Family History  Problem Relation Age of Onset   Thyroid disease Mother    Other Mother        brain disease, possibly dementia?   Gallstones Mother    Other Father        spinal cord injury/accident   Heart disease Father 3   Heart disease Sister 35       artificial valve   Colon cancer Sister    Sleep apnea Sister    Cancer Sister 10        colon   Sleep apnea Sister    Cancer Sister        pancreas   Pancreatic cancer Sister    Heart disease Sister    Heart disease Brother 30       MI   Diabetes Brother    Pancreatic cancer Brother    Heart disease Brother 29       pacemaker   Cancer Brother        liver, formerly pancreas   Heart disease Other        parent, other relative   Colon cancer Other    Stroke Neg Hx    Hypertension Neg Hx    Rectal cancer Neg Hx    Stomach cancer Neg Hx     Medications Prior to Admission  Medication Sig Dispense Refill Last Dose  gabapentin (NEURONTIN) 300 MG capsule TAKE 1 CAPSULE(300 MG) BY MOUTH AT BEDTIME 30 capsule 2 04/10/2022 at 0800   HYDROcodone-acetaminophen (NORCO) 5-325 MG tablet Take 1 tablet by mouth 2 (two) times daily as needed. 20 tablet 0 Past Week   ondansetron (ZOFRAN) 4 MG tablet Take 1 tablet (4 mg total) by mouth every 8 (eight) hours as needed for nausea or vomiting. 40 tablet 0    traZODone (DESYREL) 50 MG tablet Take 1 tablet (50 mg total) by mouth at bedtime. 30 tablet 0 Past Week   HYDROcodone-acetaminophen (NORCO) 5-325 MG tablet Take 1 tablet by mouth every 6 (six) hours as needed. To be taken after surgery (Patient not taking: Reported on 03/21/2022) 30 tablet 0 Not Taking   tadalafil (CIALIS) 5 MG tablet Take 1 tablet (5 mg total) by mouth daily. 90 tablet 0 More than a month    Current Facility-Administered Medications  Medication Dose Route Frequency Provider Last Rate Last Admin   bupivacaine liposome (EXPAREL) 1.3 % injection 266 mg  20 mL Infiltration Once Michael Boston, MD       ceFAZolin (ANCEF) IVPB 2g/100 mL premix  2 g Intravenous On Call to OR Michael Boston, MD       Chlorhexidine Gluconate Cloth 2 % PADS 6 each  6 each Topical Once Michael Boston, MD       lactated ringers infusion   Intravenous Continuous Effie Berkshire, MD 10 mL/hr at 04/11/22 0547 New Bag at 04/11/22 0547   Facility-Administered Medications Ordered in Other Encounters   Medication Dose Route Frequency Provider Last Rate Last Admin   polyethylene glycol powder (GLYCOLAX/MIRALAX) container 255 g  1 Container Oral Once Alexis Frock, MD         No Known Allergies  BP (!) 146/76   Pulse 76   Temp 98 F (36.7 C) (Oral)   Resp 16   Ht '6\' 5"'$  (1.956 m)   Wt 98 kg   SpO2 96%   BMI 25.61 kg/m   Labs: No results found for this or any previous visit (from the past 48 hour(s)).  Imaging / Studies: No results found.   Adin Hector, M.D., F.A.C.S. Gastrointestinal and Minimally Invasive Surgery [facility] [address], [place], [place] [address] [phone] Main / Paging  04/11/2022 6:45 AM    Adin Hector

## 2022-04-11 NOTE — H&P (Signed)
04/11/2022   REFERRING PHYSICIAN: Tysinger, Redge Gainer, *  Patient Care Team: Tysinger, Redge Gainer, PA as PCP - General (Family Medicine) Johney Maine, Adrian Saran, MD as Consulting Provider (General Surgery) Alexis Frock, MD (Urology) Marianna Payment, MD (Orthopedic Surgery)  PROVIDER: Hollace Kinnier, MD  [facility] MRN: [id number] DOB: 1965-12-20  SUBJECTIVE  Chief Complaint: No chief complaint on file.   History of Present Illness: Mitchell Hughes is a 56 y.o. male who is seen today as an office consultation at the request of Dr. Glade Lloyd for evaluation of No chief complaint on file. Marland Kitchen  Pleasant active gentleman. Underwent left radical nephrectomy for renal cell cancer by Dr. Tresa Moore earlier this year. Margins negative. Has noted probable groin hernia for many years. He is in fact he got through the kidney surgery noted that the hernia was larger and more sensitive. Discussed with primary care physician. Hernia suspected. Possible 1 on the left as well. Surgical consultation offered.  Aside from the robotic radical nephrectomy, he has not had any overt abdominal surgeries. He has not smoked in over a decade. He rarely drinks alcohol. He moves his bowels once or twice a day. Recalls an underwhelming colonoscopy in the past 5 years. He can walk at least 1/2-hour without difficulty. No major problems with urination or defecation. He does have sleep apnea using CPAP. He works in [employer] doing light moderate lifting up to 15 pounds. He has had some left elbow issues and is followed by orthopedics. Dr. Erlinda Hong planning to do outpatient surgery soon  Medical History:  Past Medical History: Diagnosis Date Arthritis  Patient Active Problem List Diagnosis Non-recurrent bilateral inguinal hernia without obstruction or gangrene Umbilical hernia without obstruction and without gangrene Personal history of renal cancer Olecranon bursitis of left elbow  Past Surgical History: Procedure  Laterality Date PERCUTANEOUS NEPHROLITHOTOMY Left 07/26/2021 due to tumor   No Known Allergies  Current Outpatient Medications on File Prior to Visit Medication Sig Dispense Refill gabapentin (NEURONTIN) 300 MG capsule TAKE 1 CAPSULE(300 MG) BY MOUTH AT BEDTIME sildenafiL (VIAGRA) 50 MG tablet TAKE ONE TABLET BY MOUTH EVERY 48 HOURS AS NEEDED ONE HOUR BEFORE SEX. DO NOT TAKE MORE THAN 1 TIME IN 48 HOURS. THIS IS NOT A DAILY MEDICIN  No current facility-administered medications on file prior to visit.  History reviewed. No pertinent family history.  Social History  Tobacco Use Smoking Status Former Types: Cigarettes Quit date: 2010 Years since quitting: 13.8 Smokeless Tobacco Never   Social History  Socioeconomic History Marital status: Single Tobacco Use Smoking status: Former Types: Cigarettes Quit date: 2010 Years since quitting: 13.8 Smokeless tobacco: Never Vaping Use Vaping Use: Never used Substance and Sexual Activity Alcohol use: Yes Drug use: Never  ############################################################  Review of Systems: A complete review of systems (ROS) was obtained from the patient. I have reviewed this information and discussed as appropriate with the patient. See HPI as well for other pertinent ROS.  Constitutional: No fevers, chills, sweats. Weight stable Eyes: No vision changes, No discharge HENT: No sore throats, nasal drainage Lymph: No neck swelling, No bruising easily Pulmonary: No cough, productive sputum CV: No orthopnea, PND . No exertional chest/neck/shoulder/arm pain. Patient can walk 2-3 miles without difficulty.  GI: No personal nor family history of GI/colon cancer, inflammatory bowel disease, irritable bowel syndrome, allergy such as Celiac Sprue, dietary/dairy problems, colitis, ulcers nor gastritis. No recent sick contacts/gastroenteritis. No travel outside the country. No changes in diet.  Renal: No UTIs, No hematuria.  Left renal  cell cancer status post robotic radical left nephrectomy Genital: No drainage, bleeding, masses Musculoskeletal: No severe joint pain. Good ROM major joints send some left elbow stiffness. Seen by orthopedics. Dr. Erlinda Hong plaining outpatient surgery soon Skin: No sores or lesions Heme/Lymph: No easy bleeding. No swollen lymph nodes Neuro: No active seizures. No facial droop Psych: No hallucinations. No agitation  OBJECTIVE  Vitals: 02/24/22 1602 BP: 120/70 Pulse: 86 Temp: 36.4 C (97.5 F) SpO2: 92% Weight: (!) 102.3 kg (225 lb 9.6 oz) Height: 195.6 cm ('6\' 5"'$ )  Body mass index is 26.75 kg/m.  PHYSICAL EXAM:  Constitutional: Not cachectic. Hygeine adequate. Vitals signs as above. Eyes: No glasses. Vision adequate,Pupils reactive, normal extraocular movements. Sclera nonicteric Neuro: CN II-XII intact. No major focal sensory defects. No major motor deficits. Lymph: No head/neck/groin lymphadenopathy Psych: No severe agitation. No severe anxiety. Judgment & insight Adequate, Oriented x4, HENT: Normocephalic, Mucus membranes moist. No thrush. Hearing: adequate Neck: Supple, No tracheal deviation. No obvious thyromegaly Chest: No pain to chest wall compression. Good respiratory excursion. No audible wheezing CV: Pulses intact. regular. No major extremity edema Ext: No obvious deformity or contracture. Edema: Not present. No cyanosis Skin: No major subcutaneous nodules. Warm and dry Musculoskeletal: Severe joint rigidity not present. No obvious clubbing. No digital petechiae. Mobility: no assist device moving easily without restrictions  Abdomen: Flat Soft. Nondistended. Nontender. Hernia: Present at: umbilicus, size 1O1WR. Diastasis recti: Not present. No hepatomegaly. No splenomegaly.  Genital/Pelvic: Inguinal hernia: Right greater than left obvious inguinal hernias. Mildly sensitive but reducible. Mild spermatic cord and sensitivity. No testicular sensitivity. Uncircumcised  male. . Inguinal lymph nodes: without lymphadenopathy nor hidradenitis.  Rectal: (Deferred)    ###################################################################  Labs, Imaging and Diagnostic Testing:  Located in 'Care Everywhere' section of Epic EMR chart  PRIOR [facility] CLINIC NOTES:  Not applicable  SURGERY NOTES:  Located in [place]' section of Epic EMR chart  PATHOLOGY:  Located in [place]' section of Epic EMR chart  Assessment and Plan: DIAGNOSES:  Diagnoses and all orders for this visit:  Non-recurrent bilateral inguinal hernia without obstruction or gangrene  Umbilical hernia without obstruction and without gangrene  Personal history of renal cancer  Olecranon bursitis of left elbow    ASSESSMENT/PLAN  Right greater than left inguinal hernias for the past few years and getting larger more bothersome rheumatic. Small but definite umbilical hernia.  I think he would benefit from surgery to repair all these hernias. We will plan laparoscopic approach. TEP preperitoneal repair of inguinal hernias and suture repair of umbilical hernia. Outpatient surgery. He is interested in proceeding.  The anatomy & physiology of the abdominal wall and pelvic floor was discussed. The pathophysiology of hernias in the inguinal and pelvic region was discussed. Natural history risks such as progressive enlargement, pain, incarceration, and strangulation was discussed. Contributors to complications such as smoking, obesity, diabetes, prior surgery, etc were discussed.  I feel the risks of no intervention will lead to serious problems that outweigh the operative risks; therefore, I recommended surgery to reduce and repair the hernia. I explained laparoscopic techniques with possible need for an open approach. I noted usual use of mesh to patch and/or buttress hernia repair  Risks such as bleeding, infection, abscess, need for further treatment, injury to other organs,  need for repair of tissues / organs, stroke, heart attack, death, and other risks were discussed. I noted a good likelihood this will help address the problem. Goals of post-operative recovery were discussed as well. Possibility  that this will not correct all symptoms was explained. I stressed the importance of low-impact activity, aggressive pain control, avoiding constipation, & not pushing through pain to minimize risk of post-operative chronic pain or injury. Possibility of reherniation was discussed. We will work to minimize complications.  An educational handout further explaining the pathology & treatment options was given as well. Questions were answered. The patient expresses understanding & wishes to proceed with surgery.  He most likely can do light duty in 2 or 3 weeks but will need to be 6 weeks before he is completely unrestricted. Sounds like he has moderate to heavy lifting at his job in [employer] up in [place]. He may need the full 6 weeks. Will defer to him on need for FMLA or other issues.    Adin Hector, MD, FACS, MASCRS Esophageal, Gastrointestinal & Colorectal Surgery Robotic and Minimally Invasive Surgery  [facility] [address], [place], [place] [address] [phone] Fax [phone] Main  CONTACT INFORMATION:  Weekday (9AM-5PM): Call [facility] main office at [phone]  Weeknight (5PM-9AM) or Weekend/Holiday: Check [email or web address] (password " [email or web address]") for General Surgery [facility] coverage  (Please, do not use SecureChat as it is not reliable communication to reach operating surgeons for immediate patient care given surgeries/outpatient duties/clinic/cross-coverage/off post-call which would lead to a delay in care.  Epic staff messaging available for outptient concerns, but may not be answered for 48 hours or more).    04/11/2022

## 2022-04-11 NOTE — Anesthesia Preprocedure Evaluation (Signed)
Anesthesia Evaluation  Patient identified by MRN, date of birth, ID band Patient awake    Reviewed: Allergy & Precautions, H&P , NPO status , Patient's Chart, lab work & pertinent test results  Airway Mallampati: II   Neck ROM: full    Dental   Pulmonary sleep apnea , former smoker   breath sounds clear to auscultation       Cardiovascular negative cardio ROS  Rhythm:regular Rate:Normal     Neuro/Psych  Neuromuscular disease    GI/Hepatic   Endo/Other    Renal/GU stones     Musculoskeletal  (+) Arthritis ,    Abdominal   Peds  Hematology   Anesthesia Other Findings   Reproductive/Obstetrics                             Anesthesia Physical Anesthesia Plan  ASA: 2  Anesthesia Plan: General   Post-op Pain Management:    Induction: Intravenous  PONV Risk Score and Plan: 2 and Ondansetron, Dexamethasone, Midazolam and Treatment may vary due to age or medical condition  Airway Management Planned: Oral ETT  Additional Equipment:   Intra-op Plan:   Post-operative Plan: Extubation in OR  Informed Consent: I have reviewed the patients History and Physical, chart, labs and discussed the procedure including the risks, benefits and alternatives for the proposed anesthesia with the patient or authorized representative who has indicated his/her understanding and acceptance.     Dental advisory given  Plan Discussed with: CRNA, Anesthesiologist and Surgeon  Anesthesia Plan Comments:        Anesthesia Quick Evaluation

## 2022-04-11 NOTE — Transfer of Care (Signed)
Immediate Anesthesia Transfer of Care Note  Patient: Mitchell Hughes  Procedure(s) Performed: LAPAROSCOPIC BILATERAL INGUINAL HERNIA REPAIR with MESH (Bilateral) HERNIA REPAIR UMBILICAL ADULT  Patient Location: PACU  Anesthesia Type:General  Level of Consciousness: awake, drowsy, and responds to stimulation  Airway & Oxygen Therapy: Patient Spontanous Breathing and Patient connected to face mask oxygen  Post-op Assessment: Report given to RN and Post -op Vital signs reviewed and stable  Post vital signs: Reviewed and stable  Last Vitals:  Vitals Value Taken Time  BP 146/77 04/11/22 0908  Temp    Pulse 51 04/11/22 0909  Resp 25 04/11/22 0909  SpO2 97 % 04/11/22 0909  Vitals shown include unvalidated device data.  Last Pain:  Vitals:   04/11/22 0543  TempSrc:   PainSc: 0-No pain      Patients Stated Pain Goal: 4 (27/03/50 0938)  Complications: No notable events documented.

## 2022-04-11 NOTE — Op Note (Signed)
04/11/2022  9:23 AM  PATIENT:  Mitchell Hughes  56 y.o. male  Patient Care Team: Tysinger, Leward Quan as PCP - General (Family Medicine) Leandrew Koyanagi, MD as Attending Physician (Orthopedic Surgery) Alexis Frock, MD as Consulting Physician (Urology) Star Age, MD as Attending Physician (Neurology) Vogler, Michell Heinrich, DPM as Referring Physician (Podiatry) Armbruster, Carlota Raspberry, MD as Consulting Physician (Gastroenterology)  PRE-OPERATIVE DIAGNOSIS:  BILATERAL INGUINAL HERNIAS. UMBILICAL HERNIA  POST-OPERATIVE DIAGNOSIS:  BILATERAL INGUINAL HERNIAS.  UMBILICAL HERNIA  PROCEDURE:   LAPAROSCOPIC BILATERAL INGUINAL HERNIA REPAIR with MESH HERNIA REPAIR UMBILICAL ADULT  SURGEON:  Adin Hector, MD  ASSISTANT: None  ANESTHESIA:     Regional ilioinguinal and genitofemoral and spermatic cord nerve blocks  General  Regional TRANSVERSUS ABDOMINIS PLANE (TAP) nerve block for perioperative & postoperative pain control provided with liposomal bupivacaine (Experel) mixed with 0.25% bupivacaine as a Bilateral TAP block x 72m each side at the level of the transverse abdominis & preperitoneal spaces along the flank at the anterior axillary line, from subcostal ridge to iliac crest under laparoscopic guidance    EBL:  Total I/O In: 420 [I.V.:320; IV Piggyback:100] Out: 10 [Blood:10].  See anesthesia record  Delay start of Pharmacological VTE agent (>24hrs) due to surgical blood loss or risk of bleeding:  no  DRAINS: NONE  SPECIMEN:  NONE  DISPOSITION OF SPECIMEN:  N/A  COUNTS:  YES  PLAN OF CARE: Discharge to home after PACU  PATIENT DISPOSITION:  PACU - hemodynamically stable.  INDICATION: Active gentleman with symptomatic right inguinal hernia.  Found to have contralateral left inguinal hernia as well.  Small umbilical hernia.  Patient made for operative exploration and repair of hernias found.  The anatomy & physiology of the abdominal wall and pelvic floor was  discussed.  The pathophysiology of hernias in the inguinal and pelvic region was discussed.  Natural history risks such as progressive enlargement, pain, incarceration & strangulation was discussed.   Contributors to complications such as smoking, obesity, diabetes, prior surgery, etc were discussed.    I feel the risks of no intervention will lead to serious problems that outweigh the operative risks; therefore, I recommended surgery to reduce and repair the hernia.  I explained laparoscopic techniques with possible need for an open approach.  I noted usual use of mesh to patch and/or buttress hernia repair  Risks such as bleeding, infection, abscess, need for further treatment, heart attack, death, and other risks were discussed.  I noted a good likelihood this will help address the problem.   Goals of post-operative recovery were discussed as well.  Possibility that this will not correct all symptoms was explained.  I stressed the importance of low-impact activity, aggressive pain control, avoiding constipation, & not pushing through pain to minimize risk of post-operative chronic pain or injury. Possibility of reherniation was discussed.  We will work to minimize complications.     An educational handout further explaining the pathology & treatment options was given as well.  Questions were answered.  The patient expresses understanding & wishes to proceed with surgery.  OR FINDINGS: Right-sided direct space greater than indirect inguinal pantaloon type hernia.  No femoral nor obturator hernia.  On the left side small direct space and indirect inguinal pantaloon hernia there as well.  No femoral nor obturator hernias.  Umbilical hernia through the stalk 1 x 1 cm.  Primarily repaired.  DESCRIPTION:  The patient was identified & brought into the operating room. The patient was positioned  supine with arms tucked. SCDs were active during the entire case. The patient underwent general anesthesia  without any difficulty.  The abdomen was prepped and draped in a sterile fashion. The patient's bladder was emptied.  A Surgical Timeout confirmed our plan.  I made a transverse incision through the inferior umbilical fold.  I made a small transverse nick through the anterior rectus fascia contralateral to the inguinal hernia side and placed a 0-vicryl stitch through the fascia.  I placed a Hasson trocar into the preperitoneal plane.  Entry was clean.  We induced carbon dioxide insufflation. Camera inspection revealed no injury.  I used a 69m angled scope to bluntly free the peritoneum off the infraumbilical anterior abdominal wall.  I created enough of a preperitoneal pocket to place 560mports into the right & left mid-abdomen into this preperitoneal cavity.  I focused attention on the RIGHT pelvis since that was the dominant hernia side.   I used blunt & focused sharp dissection to free the peritoneum off the flank and down to the pubic rim.  I freed the anteriolateral bladder wall off the anteriolateral pelvic wall, sparing midline attachments.   I located a swath of peritoneum going into a hernia fascial defect at the  direct space consistent with  a direct space inguinal hernia..  I gradually freed the peritoneal hernia sac off safely and reduced it into the preperitoneal space.  I freed the peritoneum off the spermatic vessels & vas deferens.  I freed peritoneum off the retroperitoneum along the psoas muscle.  Noted an indirect inguinal hernia as well.  Inguinal canal cord lipoma was dissected away & removed.  I checked & assured hemostasis.  Peritoneal repair done using 3 oh V-Loc absorbable serrated suture under laparoscopic intracorporeal suturing.  I turned attention on the opposite  LEFT pelvis.  I did dissection in a similar, mirror-image fashion. The patient had a direct space inguinal hernia..   Small indirect inguinal hernia as well Ingunial canal cord lipoma was dissected away & removed.    I  checked & assured hemostasis.     I chose sheets of medium-weight polypropylene Bard Marlex 15x15cm, one for right side.  Left side hernias were smaller so I used a light weight polypropylene Bard soft mesh 15 x 15 cm.  I cut a single sigmoid-shaped slit ~6cm from a corner of each mesh.  I placed the meshes into the preperitoneal space & laid them as overlapping diamonds such that at the inferior points, a 6x6 cm corner flap rested in the true anterolateral pelvis, covering the obturator & femoral foramina.   I allowed the bladder to return to the pubis, this helping tuck the corners of the mesh in the anteriolateral pelvis.  The medial corners overlapped each other across midline cephalad to the pubic rim.   Given the numerous hernias of moderate size, I placed a third 15x15cm mesh in the center as a vertical diamond.  The lateral wings of the mesh overlap across the direct spaces and internal rings where the dominant hernias were.  This provided good coverage and reinforcement of the hernia repairs.  Because of the central mesh placement with good overlap, I did not place any tacks.   I held the hernia sacs cephalad & evacuated carbon dioxide.  I closed the fascia with absorbable suture.  I closed the skin using 4-0 monocryl stitch.  Sterile dressings were applied.   The patient was extubated & arrived in the PACU in stable condition..Marland Kitchen  I had discussed postoperative care with the patient in the holding area.  Instructions are written in the chart.  I discussed operative findings, updated the patient's status, discussed probable steps to recovery, and gave postoperative recommendations to the  patient's friend, Shanon Brow .  Recommendations were made.  Questions were answered.  He expressed understanding & appreciation.   Adin Hector, M.D., F.A.C.S. Gastrointestinal and Minimally Invasive Surgery [facility] [address], [place], [place] [address] [phone] Main  / Paging  04/11/2022 9:23 AM

## 2022-04-11 NOTE — Anesthesia Procedure Notes (Signed)
Procedure Name: Intubation Date/Time: 04/11/2022 7:29 AM  Performed by: Lollie Sails, CRNAPre-anesthesia Checklist: Patient identified, Emergency Drugs available, Suction available, Patient being monitored and Timeout performed Patient Re-evaluated:Patient Re-evaluated prior to induction Oxygen Delivery Method: Circle system utilized Preoxygenation: Pre-oxygenation with 100% oxygen Induction Type: IV induction Ventilation: Mask ventilation without difficulty Laryngoscope Size: Miller and 3 Grade View: Grade II Tube type: Oral Number of attempts: 1 Airway Equipment and Method: Stylet Placement Confirmation: ETT inserted through vocal cords under direct vision, positive ETCO2 and breath sounds checked- equal and bilateral Secured at: 24 cm Tube secured with: Tape Dental Injury: Teeth and Oropharynx as per pre-operative assessment

## 2022-04-13 NOTE — Anesthesia Postprocedure Evaluation (Signed)
Anesthesia Post Note  Patient: Mitchell Hughes  Procedure(s) Performed: LAPAROSCOPIC BILATERAL INGUINAL HERNIA REPAIR with MESH (Bilateral) HERNIA REPAIR UMBILICAL ADULT     Patient location during evaluation: PACU Anesthesia Type: General Level of consciousness: awake and alert Pain management: pain level controlled Vital Signs Assessment: post-procedure vital signs reviewed and stable Respiratory status: spontaneous breathing, nonlabored ventilation, respiratory function stable and patient connected to nasal cannula oxygen Cardiovascular status: blood pressure returned to baseline and stable Postop Assessment: no apparent nausea or vomiting Anesthetic complications: no   No notable events documented.  Last Vitals:  Vitals:   04/11/22 1000 04/11/22 1015  BP: 108/68 109/62  Pulse: (!) 54 (!) 52  Resp: 13   Temp: 36.4 C   SpO2: 95% 97%    Last Pain:  Vitals:   04/11/22 1000  TempSrc:   PainSc: [place]

## 2022-04-14 ENCOUNTER — Encounter (HOSPITAL_COMMUNITY): Payer: Self-pay | Admitting: Surgery

## 2022-04-15 ENCOUNTER — Ambulatory Visit: Payer: BC Managed Care – PPO | Admitting: Neurology

## 2022-04-22 ENCOUNTER — Ambulatory Visit (INDEPENDENT_AMBULATORY_CARE_PROVIDER_SITE_OTHER): Payer: BC Managed Care – PPO | Admitting: Physician Assistant

## 2022-04-22 DIAGNOSIS — M7022 Olecranon bursitis, left elbow: Secondary | ICD-10-CM

## 2022-04-22 MED ORDER — CEPHALEXIN 500 MG PO CAPS: 500.0000 mg | ORAL_CAPSULE | Freq: Two times a day (BID) | ORAL | 0 refills | Status: DC | PRN

## 2022-04-22 MED ORDER — MUPIROCIN 2 % EX OINT: 1.0000 | TOPICAL_OINTMENT | Freq: Two times a day (BID) | CUTANEOUS | 0 refills | Status: AC

## 2022-04-22 NOTE — Progress Notes (Signed)
Post-Op Visit Note   Patient: Mitchell Hughes           Date of Birth: 1965-05-21           MRN: [id number] Visit Date: 04/22/2022 PCP: Carlena Hurl, PA-C   Assessment & Plan:  Chief Complaint:  Chief Complaint  Patient presents with   Left Elbow - Follow-up   Visit Diagnoses:  1. Olecranon bursitis, left elbow     Plan: Patient is a pleasant 56 year old gentleman who comes in today approximately 6 weeks status post olecranon bursectomy and triceps reattachment date of surgery 03/13/2022.  He has been doing well.  He notes slight discomfort to the elbow when he is lying on a hard surface.  Otherwise no complaints.  Examination of his left elbow reveals a well-healed surgical incision.  He does have a small Vicryl suture sticking out of the most proximal and most distal aspect.  No evidence of infection.  Full range of motion of the elbow without pain.  He is neurovascular intact distally.  Today, I removed both sutures.  Mupirocin and a Band-Aid were applied.  I will have him continue with mupirocin and Band-Aid changes until this is healed.  Have sent in Keflex to take prophylactically.  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  Follow-up in 4 weeks for recheck.  Call with concerns or questions.  Patient independently seen by me today  Follow-Up Instructions: Return in about 4 weeks (around 05/20/2022).   Orders:  No orders of the defined types were placed in this encounter.  Meds ordered this encounter  Medications   mupirocin ointment (BACTROBAN) 2 %    Sig: Apply 1 Application topically 2 (two) times daily. Apply twice daily until healed    Dispense:  22 g    Refill:  0   cephALEXin (KEFLEX) 500 MG capsule    Sig: Take 1 capsule (500 mg total) by mouth 2 (two) times daily as needed.    Dispense:  20 capsule    Refill:  0    Imaging: No results found.  PMFS History: Patient Active Problem List   Diagnosis Date  Noted   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   Benign prostatic hyperplasia with weak urinary stream 02/24/2022   Urinary hesitancy 02/24/2022   COVID-19 vaccine administered 02/24/2022   Screening for lipid disorders 02/24/2022   History of renal cell cancer 02/24/2022   History of renal stone 02/24/2022   Tendinitis of left triceps 01/21/2022   Renal stone 05/17/2021   Sepsis with acute organ dysfunction ([place]) 04/19/2021   Kidney stone 04/19/2021   Renal mass 04/19/2021   Splenomegaly 04/19/2021   Sepsis ([place]) 04/14/2021   Acute lower UTI 04/14/2021   Bilateral hand numbness 02/14/2021   Localized swelling on right hand 02/14/2021   Sleep disturbance 02/14/2021   Snoring 02/14/2021   Erectile dysfunction 02/14/2021   Need for influenza vaccination 02/14/2021   Impingement syndrome of left shoulder 06/20/2020   Primary osteoarthritis of first carpometacarpal joint of left hand 06/20/2020   Encounter for health maintenance examination in adult 02/17/2020   Screening for prostate cancer 02/17/2020   History of COVID-19 02/17/2020   Toenail deformity 02/17/2020   Neuropathy of foot, right 02/17/2020   Chronic pain of both shoulders 04/22/2019   Decreased range of motion of shoulder 04/22/2019   Degenerative arthritis of thumb, left 04/22/2019   Right inguinal hernia 01/28/2019  Aortic valve calcification 01/28/2019   Vaccine counseling 06/22/2017   Family history of heart disease 12/21/2013   Family history of colon cancer 12/21/2013   Nephrolithiasis 09/28/2009   Past Medical History:  Diagnosis Date   Arthritis    Erectile dysfunction    Family history of colon cancer    sister in 81s   Family history of heart disease 2011   baseline cardiac eval 2011 with Dr. Sallyanne Kuster   H/O echocardiogram 2015   Dr. Wynonia Lawman   H/O echocardiogram 02/2019   History of exercise stress test    04/2014 Dr. Wynonia Lawman, prior 2011 normal [place] treadmill stress test, Dr. Debara Pickett   History  of kidney stones    LOW BACK PAIN, ACUTE 09/26/2009   OSA (obstructive sleep apnea) 01/2022   has cpap   TOBACCO USE, QUIT 09/26/2009   Wears glasses     Family History  Problem Relation Age of Onset   Thyroid disease Mother    Other Mother        brain disease, possibly dementia?   Gallstones Mother    Other Father        spinal cord injury/accident   Heart disease Father 52   Heart disease Sister 74       artificial valve   Colon cancer Sister    Sleep apnea Sister    Cancer Sister 22       colon   Sleep apnea Sister    Cancer Sister        pancreas   Pancreatic cancer Sister    Heart disease Sister    Heart disease Brother 29       MI   Diabetes Brother    Pancreatic cancer Brother    Heart disease Brother 61       pacemaker   Cancer Brother        liver, formerly pancreas   Heart disease Other        parent, other relative   Colon cancer Other    Stroke Neg Hx    Hypertension Neg Hx    Rectal cancer Neg Hx    Stomach cancer Neg Hx     Past Surgical History:  Procedure Laterality Date   COLONOSCOPY  08/2017   tubular adenoma, repeat 5 years; Dr. [place] Cellar   FOOT SURGERY     bone spur, right; Dr. Orlene Plum HERNIA REPAIR Bilateral 04/11/2022   Procedure: LAPAROSCOPIC BILATERAL INGUINAL HERNIA REPAIR with MESH;  Surgeon: Michael Boston, MD;  Location: [facility];  Service: General;  Laterality: Bilateral;  LOCAL   IR NEPHROSTOMY PLACEMENT RIGHT  04/14/2021   NEPHROLITHOTOMY Right 05/17/2021   Procedure: NEPHROLITHOTOMY PERCUTANEOUS insertion double j stent;  Surgeon: Alexis Frock, MD;  Location: [facility];  Service: Urology;  Laterality: Right;   ROBOT ASSISTED LAPAROSCOPIC NEPHRECTOMY Left 07/26/2021   Procedure: XI ROBOTIC ASSISTED LAPAROSCOPIC NEPHRECTOMY;  Surgeon: Alexis Frock, MD;  Location: [facility];  Service: Urology;  Laterality: Left;  3 HRS   UMBILICAL HERNIA REPAIR N/A 04/11/2022   Procedure: HERNIA REPAIR UMBILICAL ADULT;  Surgeon: Michael Boston, MD;  Location: [facility];  Service: General;  Laterality: N/A;  PRIMARY UMBILICAL HERNIA REPAIR   WISDOM TOOTH EXTRACTION     WISDOM TOOTH EXTRACTION     Social History   Occupational History   Not on file  Tobacco Use   Smoking status: Former    Packs/day: 0.25    Years: 30.00    Total pack  years: 7.50    Types: Cigarettes    Quit date: 03/05/2009    Years since quitting: 13.1   Smokeless tobacco: Never   Tobacco comments:    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

## 2022-04-25 ENCOUNTER — Ambulatory Visit: Payer: BC Managed Care – PPO | Admitting: Orthopaedic Surgery

## 2022-05-03 DIAGNOSIS — G4733 Obstructive sleep apnea (adult) (pediatric): Secondary | ICD-10-CM | POA: Diagnosis not present

## 2022-05-06 ENCOUNTER — Other Ambulatory Visit: Payer: Self-pay | Admitting: Medical

## 2022-05-06 NOTE — Telephone Encounter (Signed)
Refill request last apt 02/24/22 next apt 02/26/23.

## 2022-05-15 ENCOUNTER — Ambulatory Visit (INDEPENDENT_AMBULATORY_CARE_PROVIDER_SITE_OTHER): Payer: BC Managed Care – PPO | Admitting: Orthopaedic Surgery

## 2022-05-15 ENCOUNTER — Encounter: Payer: Self-pay | Admitting: Orthopaedic Surgery

## 2022-05-15 DIAGNOSIS — M7022 Olecranon bursitis, left elbow: Secondary | ICD-10-CM

## 2022-05-15 DIAGNOSIS — M25729 Osteophyte, unspecified elbow: Secondary | ICD-10-CM

## 2022-05-15 NOTE — Progress Notes (Signed)
Post-Op Visit Note   Patient: Mitchell Hughes           Date of Birth: 02/13/1966           MRN: [id number] Visit Date: 05/15/2022 PCP: Carlena Hurl, PA-C   Assessment & Plan:  Chief Complaint:  Chief Complaint  Patient presents with   Left Elbow - Follow-up    Left olecranon bursectomy and triceps reattachment 03/13/2022   Visit Diagnoses:  1. Olecranon bursitis, left elbow   2. Osteophyte of olecranon process     Plan: Mitchell Hughes is approximately 8 weeks status post olecranon osteophyte excision and repair of triceps on 03/13/2022.  Overall doing well.  He has been working out in [employer].  Examination left elbow shows a fully healed surgical scar.  No signs of infection.  Full range of motion.  Strength is progressing nicely.  From the standpoint he has recovered quite well from surgery.  He can continue to advance activity as tolerated.  Follow-up as needed.  Follow-Up Instructions: No follow-ups on file.   Orders:  No orders of the defined types were placed in this encounter.  No orders of the defined types were placed in this encounter.   Imaging: No results found.  PMFS History: Patient Active Problem List   Diagnosis Date Noted   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   Benign prostatic hyperplasia with weak urinary stream 02/24/2022   Urinary hesitancy 02/24/2022   COVID-19 vaccine administered 02/24/2022   Screening for lipid disorders 02/24/2022   History of renal cell cancer 02/24/2022   History of renal stone 02/24/2022   Tendinitis of left triceps 01/21/2022   Renal stone 05/17/2021   Sepsis with acute organ dysfunction ([place]) 04/19/2021   Kidney stone 04/19/2021   Renal mass 04/19/2021   Splenomegaly 04/19/2021   Sepsis ([place]) 04/14/2021   Acute lower UTI 04/14/2021   Bilateral hand numbness 02/14/2021   Localized swelling on right hand 02/14/2021   Sleep disturbance 02/14/2021   Snoring 02/14/2021   Erectile dysfunction 02/14/2021   Need  for influenza vaccination 02/14/2021   Impingement syndrome of left shoulder 06/20/2020   Primary osteoarthritis of first carpometacarpal joint of left hand 06/20/2020   Encounter for health maintenance examination in adult 02/17/2020   Screening for prostate cancer 02/17/2020   History of COVID-19 02/17/2020   Toenail deformity 02/17/2020   Neuropathy of foot, right 02/17/2020   Chronic pain of both shoulders 04/22/2019   Decreased range of motion of shoulder 04/22/2019   Degenerative arthritis of thumb, left 04/22/2019   Right inguinal hernia 01/28/2019   Aortic valve calcification 01/28/2019   Vaccine counseling 06/22/2017   Family history of heart disease 12/21/2013   Family history of colon cancer 12/21/2013   Nephrolithiasis 09/28/2009   Past Medical History:  Diagnosis Date   Arthritis    Erectile dysfunction    Family history of colon cancer    sister in 72s   Family history of heart disease 2011   baseline cardiac eval 2011 with Dr. Sallyanne Kuster   H/O echocardiogram 2015   Dr. Wynonia Lawman   H/O echocardiogram 02/2019   History of exercise stress test    04/2014 Dr. Wynonia Lawman, prior 2011 normal [place] treadmill stress test, Dr. Debara Pickett   History of kidney stones    LOW BACK PAIN, ACUTE 09/26/2009   OSA (obstructive sleep apnea) 01/2022   has cpap   TOBACCO USE, QUIT 09/26/2009   Wears glasses     Family History  Problem Relation Age of Onset   Thyroid disease Mother    Other Mother        brain disease, possibly dementia?   Gallstones Mother    Other Father        spinal cord injury/accident   Heart disease Father 75   Heart disease Sister 54       artificial valve   Colon cancer Sister    Sleep apnea Sister    Cancer Sister 75       colon   Sleep apnea Sister    Cancer Sister        pancreas   Pancreatic cancer Sister    Heart disease Sister    Heart disease Brother 7       MI   Diabetes Brother    Pancreatic cancer Brother    Heart disease Brother 21        pacemaker   Cancer Brother        liver, formerly pancreas   Heart disease Other        parent, other relative   Colon cancer Other    Stroke Neg Hx    Hypertension Neg Hx    Rectal cancer Neg Hx    Stomach cancer Neg Hx     Past Surgical History:  Procedure Laterality Date   COLONOSCOPY  08/2017   tubular adenoma, repeat 5 years; Dr. [place] Cellar   FOOT SURGERY     bone spur, right; Dr. Orlene Plum HERNIA REPAIR Bilateral 04/11/2022   Procedure: LAPAROSCOPIC BILATERAL INGUINAL HERNIA REPAIR with MESH;  Surgeon: Michael Boston, MD;  Location: [facility];  Service: General;  Laterality: Bilateral;  LOCAL   IR NEPHROSTOMY PLACEMENT RIGHT  04/14/2021   NEPHROLITHOTOMY Right 05/17/2021   Procedure: NEPHROLITHOTOMY PERCUTANEOUS insertion double j stent;  Surgeon: Alexis Frock, MD;  Location: [facility];  Service: Urology;  Laterality: Right;   ROBOT ASSISTED LAPAROSCOPIC NEPHRECTOMY Left 07/26/2021   Procedure: XI ROBOTIC ASSISTED LAPAROSCOPIC NEPHRECTOMY;  Surgeon: Alexis Frock, MD;  Location: [facility];  Service: Urology;  Laterality: Left;  3 HRS   UMBILICAL HERNIA REPAIR N/A 04/11/2022   Procedure: HERNIA REPAIR UMBILICAL ADULT;  Surgeon: Michael Boston, MD;  Location: [facility];  Service: General;  Laterality: N/A;  PRIMARY UMBILICAL HERNIA REPAIR   WISDOM TOOTH EXTRACTION     WISDOM TOOTH EXTRACTION     Social History   Occupational History   Not on file  Tobacco Use   Smoking status: Former    Packs/day: 0.25    Years: 30.00    Total pack years: 7.50    Types: Cigarettes    Quit date: 03/05/2009    Years since quitting: 13.2   Smokeless tobacco: Never   Tobacco comments:    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

## 2022-05-23 DIAGNOSIS — G4733 Obstructive sleep apnea (adult) (pediatric): Secondary | ICD-10-CM | POA: Diagnosis not present

## 2022-05-29 ENCOUNTER — Encounter: Payer: Self-pay | Admitting: Neurology

## 2022-05-29 ENCOUNTER — Telehealth: Payer: Self-pay | Admitting: *Deleted

## 2022-05-29 ENCOUNTER — Ambulatory Visit (INDEPENDENT_AMBULATORY_CARE_PROVIDER_SITE_OTHER): Payer: BC Managed Care – PPO | Admitting: Neurology

## 2022-05-29 VITALS — BP 132/75 | HR 70 | Ht 77.0 in | Wt 225.6 lb

## 2022-05-29 DIAGNOSIS — G4733 Obstructive sleep apnea (adult) (pediatric): Secondary | ICD-10-CM

## 2022-05-29 DIAGNOSIS — G4731 Primary central sleep apnea: Secondary | ICD-10-CM | POA: Diagnosis not present

## 2022-05-29 DIAGNOSIS — D485 Neoplasm of uncertain behavior of skin: Secondary | ICD-10-CM | POA: Diagnosis not present

## 2022-05-29 DIAGNOSIS — D225 Melanocytic nevi of trunk: Secondary | ICD-10-CM | POA: Diagnosis not present

## 2022-05-29 DIAGNOSIS — L218 Other seborrheic dermatitis: Secondary | ICD-10-CM | POA: Diagnosis not present

## 2022-05-29 DIAGNOSIS — G4719 Other hypersomnia: Secondary | ICD-10-CM | POA: Diagnosis not present

## 2022-05-29 DIAGNOSIS — G479 Sleep disorder, unspecified: Secondary | ICD-10-CM

## 2022-05-29 DIAGNOSIS — Z789 Other specified health status: Secondary | ICD-10-CM | POA: Diagnosis not present

## 2022-05-29 NOTE — Telephone Encounter (Signed)
Per v.o. Dr Rexene Alberts, Resmed pressure set to 9 cm CPAP w/ EPR 3, changed from autopap.

## 2022-05-29 NOTE — Progress Notes (Signed)
Subjective:    Patient ID: Mitchell Hughes is a 57 y.o. male.  HPI    Interim history:   Mitchell Hughes is a 57 year old right-handed gentleman with an underlying medical history of low back pain, kidney stones, olecranon bursitis, arthritis, and status post bilateral hernia repairs, as well as mildly overweight state, who presents for follow-up consultation of his obstructive sleep apnea after interim testing and starting AutoPap therapy.  The patient is unaccompanied today.  I first met him at the request of his primary care provider on 11/21/2021, at which time he reported difficulty maintaining sleep and snoring.  He was advised to proceed with a sleep study.  He had a home sleep test on 01/15/2022 which showed severe obstructive sleep apnea with a total AHI of 47.3/hour and O2 nadir of 78%.  Intermittent mild to moderate snoring was detected.  The study was conducted during the day.  The patient is a third shift worker, total sleep time was slightly less than 4-1/2 hours.  He was advised to proceed with home AutoPap therapy.  His set up date was 03/03/2022.  He has a ResMed air sense 10 AutoSet machine.  His DME company is [facility].   Today, 05/29/2022: I reviewed his AutoPap compliance data from 03/30/2022 through 04/28/2022, which is a total of 30 days, during which time he used his machine 25 days with percent use days greater than 4 hours at 63%, indicating suboptimal compliance with an average usage of 5 hours and 18 minutes for days on treatment, residual AHI elevated at 19.2/h, secondary to primarily central events, 95th percentile of pressure at 9.3 cm with a range of 7 to 14 cm with EPR of 3.  Review of his compliance data since set up date shows similar findings with elevated residual AHI, primarily secondary to central events.  Leak has been on the higher side with the 95th percentile around 22 L/min.  He reports still adjusting to his AutoPap therapy.  He is motivated to continue with treatment  but does not sleep very well.  He is currently not working so he has been sleeping at night but still has trouble sleeping.  He had recent surgeries including bilateral hernia repairs in December 2023.  His primary care prescribed trazodone at 50 mg strength.  He has been taking it nightly for the past 2 months or so.  It helps a little bit but does not keep him asleep.  Sometimes he is awake for several hours.  He goes to bed around 11 currently.  He will be restarting work in mid February and will go back to the night shift.  He also had left elbow surgery not too long ago.  He does have a prescription for hydrocodone but does not take it regularly, only as needed.  He has started off with the small nasal pillows and has been using these but was also given the medium recently and would like to switch to the medium nasal pillows.  He would not be opposed to coming in for a sleep study for proper titration.  We talked about his sleep test results and also current download with residual AHI elevated and central apneas noted.  His Epworth sleepiness score is 4 out of 24.    The patient's allergies, current medications, family history, past medical history, past social history, past surgical history and problem list were reviewed and updated as appropriate.   Previously:   11/21/21: (He) reports difficulty maintaining sleep and snoring.  I reviewed your office note from 10/01/2021.  His Epworth sleepiness score is 8 out of 24, fatigue severity score is 22 out of 63.  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  He has chronic difficulty maintaining sleep for years, even before he started shift work.  He works from 7 PM to 7 AM, typically 2 days on, 2 days off, then 3 days on, working every other weekend.  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  He has tried melatonin as needed for sleep, typically 5 mg but does not take it every day.  He tries to keep a set schedule but when  he is off he tries to sleep at night.  Bedtime is generally around 9, he sleeps about 4 to 5 hours consistently and when he wakes up he has trouble going back to sleep.  He is divorced, he lives alone, no pets in the house, does have a TV in the bedroom and sometimes falls asleep with the TV on but not always.  He has 2 grown sons and a granddaughter and grandson from one of his sons.  He quit smoking over 10 years ago, he drinks alcohol occasionally, not daily, he drinks coffee about 2 cups before going into work.  He denies night to night nocturia or recurrent morning or nocturnal headaches.  He has 2 sisters with sleep apnea.  He has been told that he snores and he has also been told that he has pauses in his breathing while asleep.  He has been working on weight loss, lately his weight has been stable, in the past few months he lost about 10 pounds altogether.     His Past Medical History Is Significant For: Past Medical History:  Diagnosis Date   Arthritis    Erectile dysfunction    Family history of colon cancer    sister in 16s   Family history of heart disease 2011   baseline cardiac eval 2011 with Dr. Sallyanne Kuster   H/O echocardiogram 2015   Dr. Wynonia Lawman   H/O echocardiogram 02/2019   History of exercise stress test    04/2014 Dr. Wynonia Lawman, prior 2011 normal Bruce treadmill stress test, Dr. Debara Pickett   History of kidney stones    LOW BACK PAIN, ACUTE 09/26/2009   OSA (obstructive sleep apnea) 01/2022   has cpap   TOBACCO USE, QUIT 09/26/2009   Wears glasses     His Past Surgical History Is Significant For: Past Surgical History:  Procedure Laterality Date   COLONOSCOPY  08/2017   tubular adenoma, repeat 5 years; Dr. Yulee Cellar   FOOT SURGERY     bone spur, right; Dr. Orlene Plum HERNIA REPAIR Bilateral 04/11/2022   Procedure: LAPAROSCOPIC BILATERAL INGUINAL HERNIA REPAIR with MESH;  Surgeon: Michael Boston, MD;  Location: [facility];  Service: General;  Laterality: Bilateral;   LOCAL   IR NEPHROSTOMY PLACEMENT RIGHT  04/14/2021   NEPHROLITHOTOMY Right 05/17/2021   Procedure: NEPHROLITHOTOMY PERCUTANEOUS insertion double j stent;  Surgeon: Alexis Frock, MD;  Location: [facility];  Service: Urology;  Laterality: Right;   ROBOT ASSISTED LAPAROSCOPIC NEPHRECTOMY Left 07/26/2021   Procedure: XI ROBOTIC ASSISTED LAPAROSCOPIC NEPHRECTOMY;  Surgeon: Alexis Frock, MD;  Location: [facility];  Service: Urology;  Laterality: Left;  3 HRS   UMBILICAL HERNIA REPAIR N/A 04/11/2022   Procedure: HERNIA REPAIR UMBILICAL ADULT;  Surgeon: Michael Boston, MD;  Location: [facility];  Service: General;  Laterality: N/A;  PRIMARY UMBILICAL HERNIA REPAIR   WISDOM TOOTH EXTRACTION     WISDOM TOOTH EXTRACTION      His Family History Is Significant For: Family History  Problem Relation Age of Onset   Thyroid disease Mother    Other Mother        brain disease, possibly dementia?   Gallstones Mother    Other Father        spinal cord injury/accident   Heart disease Father 58   Heart disease Sister 86       artificial valve   Colon cancer Sister    Sleep apnea Sister    Cancer Sister 23       colon   Sleep apnea Sister    Cancer Sister        pancreas   Pancreatic cancer Sister    Heart disease Sister    Heart disease Brother 72       MI   Diabetes Brother    Pancreatic cancer Brother    Heart disease Brother 101       pacemaker   Cancer Brother        liver, formerly pancreas   Heart disease Other        parent, other relative   Colon cancer Other    Stroke Neg Hx    Hypertension Neg Hx    Rectal cancer Neg Hx    Stomach cancer Neg Hx     His Social History Is Significant For: Social History   Socioeconomic History   Marital status: Single    Spouse name: Not on file   Number of children: Not on file   Years of education: Not on file   Highest education level: Not on file  Occupational History   Not on file  Tobacco Use   Smoking status: Former    Packs/day: 0.25     Years: 30.00    Total pack years: 7.50    Types: Cigarettes    Quit date: 03/05/2009    Years since quitting: 13.2   Smokeless tobacco: Never   Tobacco comments:    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  01/2019   Social Determinants of Health   Financial Resource Strain: Not on file  Food Insecurity: Not on file  Transportation Needs: Not on file  Physical Activity: Not on file  Stress: Not on file  Social Connections: Not on file    His Allergies Are:  No Known Allergies:   His Current Medications Are:  Outpatient Encounter Medications as of 05/29/2022  Medication Sig   gabapentin (NEURONTIN) 300 MG capsule TAKE 1 CAPSULE(300 MG) BY MOUTH AT BEDTIME   HYDROcodone-acetaminophen (NORCO) 5-325 MG tablet Take 1 tablet by mouth every 6 (six) hours as needed. (Patient taking differently: Take 1 tablet by mouth as needed.)   tadalafil (CIALIS) 5 MG tablet Take 1 tablet (5 mg total) by mouth daily.   traZODone (DESYREL) 50 MG tablet TAKE  1 TABLET(50 MG) BY MOUTH AT BEDTIME   cephALEXin (KEFLEX) 500 MG capsule Take 1 capsule (500 mg total) by mouth 2 (two) times daily as needed.   mupirocin ointment (BACTROBAN) 2 % Apply 1 Application topically 2 (two) times daily. Apply twice daily until healed   ondansetron (ZOFRAN) 4 MG tablet Take 1 tablet (4 mg total) by mouth every 8 (eight) hours as needed for nausea or vomiting. (Patient not taking: Reported on 05/29/2022)   Facility-Administered Encounter Medications as of 05/29/2022  Medication    polyethylene glycol powder (GLYCOLAX/MIRALAX) container 255 g  :  Review of Systems:  Out of a complete 14 point review of systems, all are reviewed and negative with the exception of these symptoms as listed below:  Review of Systems  Neurological:        Pt here for CPAP f/u Pt states he still has issues sleeping.Pt states he gets  5-6 hours of sleep nightly   ESS:4    Objective:  Neurological Exam  Physical Exam Physical Examination:   Vitals:   05/29/22 0750  BP: 132/75  Pulse: 70   General Examination: The patient is a very pleasant 57 y.o. male in no acute distress. He appears well-developed and well-nourished and well groomed.   HEENT: Normocephalic, atraumatic, pupils are equal, round and reactive to light, extraocular tracking is good without limitation to gaze excursion or nystagmus noted. Hearing is grossly intact. Face is symmetric with normal facial animation. Speech is clear with no dysarthria noted. There is no hypophonia. There is no lip, neck/head, jaw or voice tremor. Neck is supple with full range of passive and active motion. There are no carotid bruits on auscultation. Oropharynx exam reveals: mild mouth dryness, good dental hygiene and moderate airway crowding, due to widened uvula, tonsillar size of 1-2+, Mallampati class III.  Neck circumference of 17-1/8 inches.  Mild to moderate overbite noted.  Tongue protrudes centrally and palate elevates symmetrically.   Chest: Clear to auscultation without wheezing, rhonchi or crackles noted.   Heart: S1+S2+0, regular and normal without murmurs, rubs or gallops noted.    Abdomen: Soft, non-tender and non-distended.   Extremities: There is no pitting edema in the distal lower extremities bilaterally.    Skin: Warm and dry without trophic changes noted.    Musculoskeletal: exam reveals no obvious joint deformities.    Neurologically:  Mental status: The patient is awake, alert and oriented in all 4 spheres. His  immediate and remote memory, attention, language skills and fund of knowledge are appropriate. There is no evidence of aphasia, agnosia, apraxia or anomia. Speech is clear with normal prosody and enunciation. Thought process is linear. Mood is normal and affect is normal.  Cranial nerves II - XII are as described above under HEENT exam.  Motor exam: Normal bulk, strength and tone is noted. There is no tremor, Romberg is negative. Reflexes are 2+ throughout. Fine motor skills and coordination: grossly intact.  Cerebellar testing: No dysmetria or intention tremor. There is no truncal or gait ataxia.  Sensory exam: intact to light touch in the upper and lower extremities.  Gait, station and balance: He stands easily. No veering to one side is noted. No leaning to one side is noted. Posture is age-appropriate and stance is narrow based. Gait shows normal stride length and normal pace. No problems turning are noted.  Assessment and Plan:  In summary, Mitchell Hughes is a very pleasant 57 year old male with an underlying medical history of low  back pain, kidney stones, olecranon bursitis, arthritis, and status post bilateral hernia repairs, as well as mildly overweight state, who presents for follow-up consultation of his obstructive sleep apnea after interim testing and starting AutoPap therapy.  His home sleep test from 01/15/2022 showed severe obstructive sleep apnea with a total AHI of 47.3/hour and O2 nadir of 78%.  Intermittent mild to moderate snoring was detected.  The study was conducted during the day.  He has been on AutoPap therapy since 03/03/2022.  He is still struggling with compliance as well as tolerance.  His residual AHI is elevated.  He has had central events.  He had interim surgeries.  He does not take his narcotic pain medication on a regular basis but has started taking trazodone fairly regularly in the past couple months.  He is on a low dose. He has a ResMed air sense 10 AutoSet machine.  I  suggested we change him to a set pressure of 9 cm at this time in the hopes that his tolerance will be better and central apneas may improve over time.  He is advised to switch to the medium nasal pillows, his sleep is on the higher side. His DME company is [facility].  He is not completely compliant yet with treatment but has valid issues for difficulty sleeping, he has not been sleeping on his regular daytime schedule because he has been out of work due to recent surgeries.  He is a third shift Insurance underwriter.  He will be starting back on night shift in mid February.  He is advised to come in for a proper titration study.  If CPAP is not effective particularly in controlling his central apneas he may need a different treatment modalities such as BiPAP or BiPAP ST and we may have to escalate treatment beyond that but I am hopeful that BiPAP or BiPAP ST may be effective enough for him.  He is advised to continue to keep a scheduled sleep and rise time.  He has a difficult work schedule with 2 days on 2 days off, 12-hour shifts.  He would be willing to come in for a sleep study and would prefer a nighttime sleep study for titration.  We will plan a follow-up after testing.  He is encouraged to be compliant with his current machine which we will set to CPAP of 9 with EPR of 3.  I answered all his questions today and he was in agreement with our plan. I spent 40 minutes in total face-to-face time and in reviewing records during pre-charting, more than 50% of which was spent in counseling and coordination of care, reviewing test results, reviewing medications and treatment regimen and/or in discussing or reviewing the diagnosis of OSA, CSA, the prognosis and treatment options. Pertinent laboratory and imaging test results that were available during this visit with the patient were reviewed by me and considered in my medical decision making (see chart for details).

## 2022-05-29 NOTE — Telephone Encounter (Signed)
Order faxed to [place]. Received a receipt of confirmation.

## 2022-05-29 NOTE — Patient Instructions (Addendum)
It was nice to see you again today.  As discussed, we will change your treatment from AutoPap to CPAP of 9 cm, we have made the change online.  Please try the medium nasal pillows as the small pillows may be not optimal as far as fit.  We will ask insurance for authorization to bring you in for a proper titration study overnight in our sleep lab.  Please call us or email [place] if you have not heard from [place] in the next 2 to 3 weeks.  Hang in there!  Please continue to use your machine consistently is much as possible.

## 2022-06-03 ENCOUNTER — Other Ambulatory Visit: Payer: Self-pay | Admitting: Medical

## 2022-06-03 ENCOUNTER — Telehealth: Payer: Self-pay | Admitting: Neurology

## 2022-06-03 ENCOUNTER — Encounter: Payer: Self-pay | Admitting: Neurology

## 2022-06-03 DIAGNOSIS — G4733 Obstructive sleep apnea (adult) (pediatric): Secondary | ICD-10-CM

## 2022-06-03 NOTE — Telephone Encounter (Signed)
Pt is calling. Stated he failed his sleep study and the insurance want day for his CPAP.  Pt is asking if provider will submit another study so they will pay for machine. Pt is requesting a call back from nurse.

## 2022-06-03 NOTE — Telephone Encounter (Signed)
Faxed over to [facility] new order to restart.  Received fax confirmation.

## 2022-06-03 NOTE — Telephone Encounter (Signed)
I called [facility] to find out what was going on with pt.  She said pt not compliant and would need restart all over.  (Turn in machine).  She then said that speaking with compliance dept. That if we send order over to restart they will work from there.  I called and LMVM  pt know.  I asked him in my message about the cpap titration. He is to call back if questions.  Order placed for restart will fax to [facility] when cosigned.

## 2022-06-04 DIAGNOSIS — G4733 Obstructive sleep apnea (adult) (pediatric): Secondary | ICD-10-CM | POA: Diagnosis not present

## 2022-06-04 NOTE — Telephone Encounter (Signed)
Pt returned call.  I relayed below message that compliance will be looking into his case.  He has there # to follow up on.  I will send message to EE in sleep lab to see about cpap titration study.

## 2022-06-06 ENCOUNTER — Ambulatory Visit: Payer: BC Managed Care – PPO | Admitting: Orthopaedic Surgery

## 2022-06-11 ENCOUNTER — Telehealth: Payer: Self-pay | Admitting: Neurology

## 2022-06-11 NOTE — Telephone Encounter (Signed)
Jannetta Quint: [id number] (exp. 06/11/22 to 08/09/22)  LVM for pt to call me back and sent him a mychart message to schedule his SS.

## 2022-06-11 NOTE — Telephone Encounter (Signed)
Sent mychart message

## 2022-06-12 NOTE — Telephone Encounter (Signed)
CPAP- [employer] Josem Kaufmann: [id number] (exp. 06/11/22 to 08/09/22)    Patient is scheduled at [facility] for 08/04/22 at 8 pm.  Sent mychart message to the patient about his appointment information.

## 2022-06-18 ENCOUNTER — Ambulatory Visit (INDEPENDENT_AMBULATORY_CARE_PROVIDER_SITE_OTHER): Payer: BC Managed Care – PPO

## 2022-06-18 ENCOUNTER — Ambulatory Visit (INDEPENDENT_AMBULATORY_CARE_PROVIDER_SITE_OTHER): Payer: BC Managed Care – PPO | Admitting: Orthopaedic Surgery

## 2022-06-18 ENCOUNTER — Encounter: Payer: Self-pay | Admitting: Orthopaedic Surgery

## 2022-06-18 DIAGNOSIS — G8929 Other chronic pain: Secondary | ICD-10-CM | POA: Diagnosis not present

## 2022-06-18 DIAGNOSIS — M25512 Pain in left shoulder: Secondary | ICD-10-CM

## 2022-06-18 DIAGNOSIS — M25511 Pain in right shoulder: Secondary | ICD-10-CM

## 2022-06-18 MED ORDER — TRAMADOL HCL 50 MG PO TABS: 50.0000 mg | ORAL_TABLET | Freq: Three times a day (TID) | ORAL | 2 refills | Status: AC | PRN

## 2022-06-18 NOTE — Addendum Note (Signed)
Addended by: Azucena Cecil on: 06/18/2022 11:15 AM   Modules accepted: Level of Service

## 2022-06-18 NOTE — Progress Notes (Addendum)
Office Visit Note   Patient: Mitchell Hughes           Date of Birth: Jan 25, 1966           MRN: [id number] Visit Date: 06/18/2022              Requested by: Carlena Hurl, PA-C [address] [place],   [id number] PCP: Carlena Hurl, PA-C   Assessment & Plan: Visit Diagnoses:  1. Chronic pain of both shoulders     Plan: Impression is chronic bilateral shoulder pain with known rotator cuff tear on the left and probable rotator cuff pathology on the right.  Patient has undergone bilateral subacromial cortisone injections with only temporary relief.  He has been working on a guided home exercise program for several months without relief.  At this point, I would like to get an MRI of the right shoulder and repeat the MRI of the left shoulder for surgical planning.  He will follow-up with [place] once these have been completed.  Follow-Up Instructions: Return for f/u after MRI.   Orders:  Orders Placed This Encounter  Procedures   XR Shoulder Left   XR Shoulder Right   MR SHOULDER RIGHT WO CONTRAST   MR Shoulder Left w/o contrast   Meds ordered this encounter  Medications   traMADol (ULTRAM) 50 MG tablet    Sig: Take 1 tablet (50 mg total) by mouth 3 (three) times daily as needed.    Dispense:  30 tablet    Refill:  2      Procedures: No procedures performed   Clinical Data: No additional findings.   Subjective: Chief Complaint  Patient presents with   Right Shoulder - Pain   Left Shoulder - Pain    HPI patient is a pleasant 57 year old gentleman who comes in today with bilateral shoulder pain right greater than left.  Left shoulder pain for the past several years.  Right shoulder pain since December.  He denies any injury.  The pain he has is primarily to the posterior aspect of the shoulder and radiates into the deltoid.  Symptoms are constant but worse with forward flexion as well as when he is sleeping on either side.  He has tried over-the-counter pain  medication without relief.  He has previously undergone bilateral subacromial cortisone injections with mild and temporary relief.  He has previously undergone MRI of the left shoulder back in February 2022 which showed full-thickness retracted supraspinatus tear with retraction of 8.5 mm.  No MRI of the right shoulder.  Review of Systems as detailed in HPI.  All others reviewed and are negative.   Objective: Vital Signs: There were no vitals taken for this visit.  Physical Exam well-developed well-nourished gentleman in no acute distress.  Alert and oriented x 3.  Ortho Exam bilateral shoulder exam reveals forward flexion to approximate 160 degrees on the right and approximate 170 on the left but this is with pain.  He has near full internal or external rotation but this is with pain as well.  Positive empty can test.  Negative speeds and negative O'Brien's.  Negative bearhug.  Near full strength throughout.  He is neurovascularly intact distally.  Specialty Comments:  No specialty comments available.  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   Benign prostatic hyperplasia with weak urinary stream 02/24/2022   Urinary hesitancy 02/24/2022   COVID-19  vaccine administered 02/24/2022   Screening for lipid disorders 02/24/2022   History of renal cell cancer 02/24/2022   History of renal stone 02/24/2022   Tendinitis of left triceps 01/21/2022   Renal stone 05/17/2021   Sepsis with acute organ dysfunction ([place]) 04/19/2021   Kidney stone 04/19/2021   Renal mass 04/19/2021   Splenomegaly 04/19/2021   Sepsis ([place]) 04/14/2021   Acute lower UTI 04/14/2021   Bilateral hand numbness 02/14/2021   Localized swelling on right hand 02/14/2021   Sleep disturbance 02/14/2021   Snoring 02/14/2021   Erectile dysfunction 02/14/2021   Need for influenza vaccination 02/14/2021   Impingement  syndrome of left shoulder 06/20/2020   Primary osteoarthritis of first carpometacarpal joint of left hand 06/20/2020   Encounter for health maintenance examination in adult 02/17/2020   Screening for prostate cancer 02/17/2020   History of COVID-19 02/17/2020   Toenail deformity 02/17/2020   Neuropathy of foot, right 02/17/2020   Chronic pain of both shoulders 04/22/2019   Decreased range of motion of shoulder 04/22/2019   Degenerative arthritis of thumb, left 04/22/2019   Right inguinal hernia 01/28/2019   Aortic valve calcification 01/28/2019   Vaccine counseling 06/22/2017   Family history of heart disease 12/21/2013   Family history of colon cancer 12/21/2013   Nephrolithiasis 09/28/2009   Past Medical History:  Diagnosis Date   Arthritis    Erectile dysfunction    Family history of colon cancer    sister in 104s   Family history of heart disease 2011   baseline cardiac eval 2011 with Dr. Sallyanne Kuster   H/O echocardiogram 2015   Dr. Wynonia Lawman   H/O echocardiogram 02/2019   History of exercise stress test    04/2014 Dr. Wynonia Lawman, prior 2011 normal Bruce treadmill stress test, Dr. Debara Pickett   History of kidney stones    LOW BACK PAIN, ACUTE 09/26/2009   OSA (obstructive sleep apnea) 01/2022   has cpap   TOBACCO USE, QUIT 09/26/2009   Wears glasses     Family History  Problem Relation Age of Onset   Thyroid disease Mother    Other Mother        brain disease, possibly dementia?   Gallstones Mother    Other Father        spinal cord injury/accident   Heart disease Father 39   Heart disease Sister 63       artificial valve   Colon cancer Sister    Sleep apnea Sister    Cancer Sister 63       colon   Sleep apnea Sister    Cancer Sister        pancreas   Pancreatic cancer Sister    Heart disease Sister    Heart disease Brother 63       MI   Diabetes Brother    Pancreatic cancer Brother    Heart disease Brother 70       pacemaker   Cancer Brother        liver, formerly  pancreas   Heart disease Other        parent, other relative   Colon cancer Other    Stroke Neg Hx    Hypertension Neg Hx    Rectal cancer Neg Hx    Stomach cancer Neg Hx     Past Surgical History:  Procedure Laterality Date   COLONOSCOPY  08/2017   tubular adenoma, repeat 5 years; Dr. [place] Cellar   FOOT SURGERY  bone spur, right; Dr. Orlene Plum HERNIA REPAIR Bilateral 04/11/2022   Procedure: LAPAROSCOPIC BILATERAL INGUINAL HERNIA REPAIR with MESH;  Surgeon: Michael Boston, MD;  Location: [facility];  Service: General;  Laterality: Bilateral;  LOCAL   IR NEPHROSTOMY PLACEMENT RIGHT  04/14/2021   NEPHROLITHOTOMY Right 05/17/2021   Procedure: NEPHROLITHOTOMY PERCUTANEOUS insertion double j stent;  Surgeon: Alexis Frock, MD;  Location: [facility];  Service: Urology;  Laterality: Right;   ROBOT ASSISTED LAPAROSCOPIC NEPHRECTOMY Left 07/26/2021   Procedure: XI ROBOTIC ASSISTED LAPAROSCOPIC NEPHRECTOMY;  Surgeon: Alexis Frock, MD;  Location: [facility];  Service: Urology;  Laterality: Left;  3 HRS   UMBILICAL HERNIA REPAIR N/A 04/11/2022   Procedure: HERNIA REPAIR UMBILICAL ADULT;  Surgeon: Michael Boston, MD;  Location: [facility];  Service: General;  Laterality: N/A;  PRIMARY UMBILICAL HERNIA REPAIR   WISDOM TOOTH EXTRACTION     WISDOM TOOTH EXTRACTION     Social History   Occupational History   Not on file  Tobacco Use   Smoking status: Former    Packs/day: 0.25    Years: 30.00    Total pack years: 7.50    Types: Cigarettes    Quit date: 03/05/2009    Years since quitting: 13.2   Smokeless tobacco: Never   Tobacco comments:    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

## 2022-06-18 NOTE — Addendum Note (Signed)
Addended by: Lendon Collar on: 06/18/2022 11:06 AM   Modules accepted: Orders

## 2022-07-02 ENCOUNTER — Telehealth: Payer: Self-pay | Admitting: Orthopaedic Surgery

## 2022-07-02 NOTE — Telephone Encounter (Signed)
Called pt 1X and left vm for pt to call and set MRI review appt with Dr Erlinda Hong after 3/13

## 2022-07-03 DIAGNOSIS — G4733 Obstructive sleep apnea (adult) (pediatric): Secondary | ICD-10-CM | POA: Diagnosis not present

## 2022-07-12 ENCOUNTER — Other Ambulatory Visit: Payer: BC Managed Care – PPO

## 2022-07-16 ENCOUNTER — Ambulatory Visit
Admission: RE | Admit: 2022-07-16 | Discharge: 2022-07-16 | Disposition: A | Discharge status: Home / Self Care | Payer: BC Managed Care – PPO | Source: Ambulatory Visit | Attending: Orthopaedic Surgery | Admitting: Orthopaedic Surgery

## 2022-07-16 DIAGNOSIS — G8929 Other chronic pain: Secondary | ICD-10-CM

## 2022-07-16 DIAGNOSIS — M19012 Primary osteoarthritis, left shoulder: Secondary | ICD-10-CM | POA: Diagnosis not present

## 2022-07-16 DIAGNOSIS — S46011A Strain of muscle(s) and tendon(s) of the rotator cuff of right shoulder, initial encounter: Secondary | ICD-10-CM | POA: Diagnosis not present

## 2022-07-16 DIAGNOSIS — S46012A Strain of muscle(s) and tendon(s) of the rotator cuff of left shoulder, initial encounter: Secondary | ICD-10-CM | POA: Diagnosis not present

## 2022-07-16 DIAGNOSIS — M19011 Primary osteoarthritis, right shoulder: Secondary | ICD-10-CM | POA: Diagnosis not present

## 2022-07-22 ENCOUNTER — Other Ambulatory Visit: Payer: Self-pay

## 2022-07-22 ENCOUNTER — Ambulatory Visit (INDEPENDENT_AMBULATORY_CARE_PROVIDER_SITE_OTHER): Payer: BC Managed Care – PPO | Admitting: Orthopaedic Surgery

## 2022-07-22 ENCOUNTER — Ambulatory Visit
Admission: RE | Admit: 2022-07-22 | Discharge: 2022-07-22 | Disposition: A | Discharge status: Home / Self Care | Payer: BC Managed Care – PPO | Source: Ambulatory Visit | Attending: Nurse Practitioner | Admitting: Nurse Practitioner

## 2022-07-22 VITALS — BP 124/80 | HR 75 | Temp 98.6°F | Resp 20

## 2022-07-22 DIAGNOSIS — B9689 Other specified bacterial agents as the cause of diseases classified elsewhere: Secondary | ICD-10-CM

## 2022-07-22 DIAGNOSIS — M75122 Complete rotator cuff tear or rupture of left shoulder, not specified as traumatic: Secondary | ICD-10-CM | POA: Diagnosis not present

## 2022-07-22 DIAGNOSIS — J019 Acute sinusitis, unspecified: Secondary | ICD-10-CM

## 2022-07-22 DIAGNOSIS — M75121 Complete rotator cuff tear or rupture of right shoulder, not specified as traumatic: Secondary | ICD-10-CM | POA: Diagnosis not present

## 2022-07-22 DIAGNOSIS — M7541 Impingement syndrome of right shoulder: Secondary | ICD-10-CM | POA: Insufficient documentation

## 2022-07-22 DIAGNOSIS — M7542 Impingement syndrome of left shoulder: Secondary | ICD-10-CM | POA: Diagnosis not present

## 2022-07-22 MED ORDER — AMOXICILLIN-POT CLAVULANATE 875-125 MG PO TABS: 1.0000 | ORAL_TABLET | Freq: Two times a day (BID) | ORAL | 0 refills | Status: AC

## 2022-07-22 NOTE — ED Provider Notes (Signed)
[facility]    CSN: [id number] Arrival date & time: 07/22/22  1342      History   Chief Complaint Chief Complaint  Patient presents with   Nasal Congestion    I have a lot of sores and blood in my sinus - Entered by patient    HPI Mitchell Hughes is a 57 y.o. male.   Patient presents today for 3-day history of nasal congestion, runny nose, postnasal drainage, sinus pressure above his eyes.  Denies fever, body aches or chills, congested or dry cough, shortness of breath, chest pain, chest tightness, sore throat, headache, ear pain, abdominal pain, nausea/vomiting, diarrhea, decreased appetite, and fatigue.  Has been trying over-the-counter medications including Zyrtec, Allegra, sinus medications, and Afrin nasal spray without much benefit.  Patient denies antibiotic use in the past 90 days.    Past Medical History:  Diagnosis Date   Arthritis    Erectile dysfunction    Family history of colon cancer    sister in 31s   Family history of heart disease 2011   baseline cardiac eval 2011 with Dr. Sallyanne Kuster   H/O echocardiogram 2015   Dr. Wynonia Lawman   H/O echocardiogram 02/2019   History of exercise stress test    04/2014 Dr. Wynonia Lawman, prior 2011 normal [place] treadmill stress test, Dr. 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   Benign prostatic hyperplasia with weak urinary stream 02/24/2022   Urinary hesitancy 02/24/2022   COVID-19 vaccine administered 02/24/2022   Screening for lipid disorders 02/24/2022   History of renal cell cancer 02/24/2022   History of renal stone 02/24/2022   Tendinitis of left triceps 01/21/2022   Renal  stone 05/17/2021   Sepsis with acute organ dysfunction ([place]) 04/19/2021   Kidney stone 04/19/2021   Renal mass 04/19/2021   Splenomegaly 04/19/2021   Sepsis ([place]) 04/14/2021   Acute lower UTI 04/14/2021   Bilateral hand numbness 02/14/2021   Localized swelling on right hand 02/14/2021   Sleep disturbance 02/14/2021   Snoring 02/14/2021   Erectile dysfunction 02/14/2021   Need for influenza vaccination 02/14/2021   Impingement syndrome of left shoulder 06/20/2020   Primary osteoarthritis of first carpometacarpal joint of left hand 06/20/2020   Encounter for health maintenance examination in adult 02/17/2020   Screening for prostate cancer 02/17/2020   History of COVID-19 02/17/2020   Toenail deformity 02/17/2020   Neuropathy of foot, right 02/17/2020   Chronic pain of both shoulders 04/22/2019   Decreased range of motion of shoulder 04/22/2019   Degenerative arthritis of thumb, left 04/22/2019   Right inguinal hernia 01/28/2019   Aortic valve calcification 01/28/2019   Vaccine counseling 06/22/2017   Family history of heart disease 12/21/2013   Family history of colon cancer 12/21/2013   Nephrolithiasis 09/28/2009    Past Surgical History:  Procedure Laterality Date   COLONOSCOPY  08/2017   tubular adenoma, repeat 5 years; Dr. [place] Cellar   FOOT SURGERY     bone spur, right; Dr. Orlene Plum HERNIA REPAIR Bilateral 04/11/2022   Procedure: LAPAROSCOPIC BILATERAL INGUINAL HERNIA REPAIR with MESH;  Surgeon:  Michael Boston, MD;  Location: [facility];  Service: General;  Laterality: Bilateral;  LOCAL   IR NEPHROSTOMY PLACEMENT RIGHT  04/14/2021   NEPHROLITHOTOMY Right 05/17/2021   Procedure: NEPHROLITHOTOMY PERCUTANEOUS insertion double j stent;  Surgeon: Alexis Frock, MD;  Location: [facility];  Service: Urology;  Laterality: Right;   ROBOT ASSISTED LAPAROSCOPIC NEPHRECTOMY Left 07/26/2021   Procedure: XI ROBOTIC ASSISTED LAPAROSCOPIC NEPHRECTOMY;  Surgeon: Alexis Frock, MD;   Location: [facility];  Service: Urology;  Laterality: Left;  3 HRS   UMBILICAL HERNIA REPAIR N/A 04/11/2022   Procedure: HERNIA REPAIR UMBILICAL ADULT;  Surgeon: Michael Boston, MD;  Location: [facility];  Service: General;  Laterality: N/A;  PRIMARY UMBILICAL HERNIA REPAIR   WISDOM TOOTH EXTRACTION     WISDOM TOOTH EXTRACTION         Home Medications    Prior to Admission medications   Medication Sig Start Date End Date Taking? Authorizing Provider  amoxicillin-clavulanate (AUGMENTIN) 875-125 MG tablet Take 1 tablet by mouth 2 (two) times daily for 7 days. 07/22/22 07/29/22 Yes Eulogio Bear, NP  gabapentin (NEURONTIN) 300 MG capsule TAKE 1 CAPSULE(300 MG) BY MOUTH AT BEDTIME 08/30/21   Tysinger, Camelia Eng, PA-C  HYDROcodone-acetaminophen (NORCO) 5-325 MG tablet Take 1 tablet by mouth every 6 (six) hours as needed. Patient taking differently: Take 1 tablet by mouth as needed. 04/11/22   Michael Boston, MD  mupirocin ointment (BACTROBAN) 2 % Apply 1 Application topically 2 (two) times daily. Apply twice daily until healed 04/22/22   Aundra Dubin, PA-C  ondansetron (ZOFRAN) 4 MG tablet Take 1 tablet (4 mg total) by mouth every 8 (eight) hours as needed for nausea or vomiting. 03/10/22   Aundra Dubin, PA-C  tadalafil (CIALIS) 5 MG tablet TAKE 1 TABLET(5 MG) BY MOUTH DAILY 06/03/22   Tysinger, Camelia Eng, PA-C  traMADol (ULTRAM) 50 MG tablet Take 1 tablet (50 mg total) by mouth 3 (three) times daily as needed. 06/18/22   Aundra Dubin, PA-C  traZODone (DESYREL) 50 MG tablet TAKE 1 TABLET(50 MG) BY MOUTH AT BEDTIME 05/07/22   Tysinger, Camelia Eng, PA-C    Family History Family History  Problem Relation Age of Onset   Thyroid disease Mother    Other Mother        brain disease, possibly dementia?   Gallstones Mother    Other Father        spinal cord injury/accident   Heart disease Father 4   Heart disease Sister 14       artificial valve   Colon cancer Sister    Sleep apnea Sister    Cancer  Sister 38       colon   Sleep apnea Sister    Cancer Sister        pancreas   Pancreatic cancer Sister    Heart disease Sister    Heart disease Brother 26       MI   Diabetes Brother    Pancreatic cancer Brother    Heart disease Brother 74       pacemaker   Cancer Brother        liver, formerly pancreas   Heart disease Other        parent, other relative   Colon cancer Other    Stroke Neg Hx    Hypertension Neg Hx    Rectal cancer Neg Hx    Stomach cancer Neg Hx     Social History Social History  Tobacco Use   Smoking status: Former    Packs/day: 0.25    Years: 30.00    Additional pack years: 0.00    Total pack years: 7.50    Types: Cigarettes    Quit date: 03/05/2009    Years since quitting: 13.3   Smokeless tobacco: Never   Tobacco comments:    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   Review of Systems Review of Systems Per HPI  Physical Exam Triage Vital Signs ED Triage Vitals  Enc Vitals Group     BP 07/22/22 1424 124/80     Pulse Rate 07/22/22 1424 75     Resp 07/22/22 1424 20     Temp 07/22/22 1424 98.6 F (37 C)     Temp Source 07/22/22 1424 Oral     SpO2 07/22/22 1424 94 %     Weight --      Height --      Head Circumference --      Peak Flow --      Pain Score 07/22/22 1423 8     Pain Loc --      Pain Edu? --      Excl. in [place]? --    No data found.  Updated Vital Signs BP 124/80 (BP Location: Right Arm)   Pulse 75   Temp 98.6 F (37 C) (Oral)   Resp 20   SpO2 94%   Visual Acuity Right Eye Distance:   Left Eye Distance:   Bilateral Distance:    Right Eye Near:   Left Eye Near:    Bilateral Near:     Physical Exam Vitals and nursing note  reviewed.  Constitutional:      General: He is not in acute distress.    Appearance: Normal appearance. He is not ill-appearing or toxic-appearing.  HENT:     Head: Normocephalic and atraumatic.     Right Ear: Ear canal and external ear normal. A middle ear effusion is present.     Left Ear: Ear canal and external ear normal. A middle ear effusion is present.     Nose: Congestion and rhinorrhea present.     Right Sinus: Frontal sinus tenderness present. No maxillary sinus tenderness.     Left Sinus: Frontal sinus tenderness present. No maxillary sinus tenderness.     Mouth/Throat:     Mouth: Mucous membranes are moist.     Pharynx: Oropharynx is clear. No oropharyngeal exudate or posterior oropharyngeal erythema.     Tonsils: No tonsillar exudate. 0 on the right. 0 on the left.  Eyes:     General: No scleral icterus.    Extraocular Movements: Extraocular movements intact.  Cardiovascular:     Rate and Rhythm: Normal rate and regular rhythm.  Pulmonary:     Effort: Pulmonary effort is normal. No respiratory distress.     Breath sounds: Normal breath sounds. No wheezing, rhonchi or rales.  Abdominal:     General: Abdomen is flat. Bowel sounds are normal. There is no distension.     Palpations: Abdomen is soft.     Tenderness: There is no abdominal tenderness.  Musculoskeletal:     Cervical back: Normal range of motion and neck supple.  Lymphadenopathy:     Cervical: No cervical adenopathy.  Skin:    General: Skin is warm and dry.     Coloration: Skin is not jaundiced or pale.     Findings: No erythema or rash.  Neurological:     Mental Status: He is alert and oriented to person, place, and time.  Psychiatric:        Behavior: Behavior is cooperative.      UC Treatments / Results  Labs (all labs ordered are listed, but only abnormal results are displayed) Labs Reviewed - No data to display  EKG   Radiology No results found.  Procedures Procedures (including  critical care time)  Medications Ordered in UC Medications - No data to display  Initial Impression / Assessment and Plan / UC Course  I have reviewed the triage vital signs and the nursing notes.  Pertinent labs & imaging results that were available during my care of the patient were reviewed by me and considered in my medical decision making (see chart for details).   Patient is well-appearing, normotensive, afebrile, not tachycardic, not tachypneic, oxygenating well on room air.    1. Acute bacterial sinusitis Start Augmentin twice daily for 7 days Other supportive care discussed including oral antihistamine, Flonase nasal spray ER and return precautions discussed with patient  The patient was given the opportunity to ask questions.  All questions answered to their satisfaction.  The patient is in agreement to this plan.   Final Clinical Impressions(s) / UC Diagnoses   Final diagnoses:  Acute bacterial sinusitis     Discharge Instructions      You have a bacterial sinus infection.  Take the Augmentin as prescribed.  Continue oral antihistamine and flonase nasal spray.    Some things that can make you feel better are: - Increased rest - Increasing fluid with water/sugar free electrolytes - Acetaminophen and ibuprofen as needed for fever/pain - Salt water gargling, chloraseptic spray and throat lozenges - OTC guaifenesin (Mucinex) 600 mg twice daily for congestion - Saline sinus flushes or a neti pot - Humidifying the air     ED Prescriptions     Medication Sig Dispense Auth. Provider   amoxicillin-clavulanate (AUGMENTIN) 875-125 MG tablet Take 1 tablet by mouth 2 (two) times daily for 7 days. 14 tablet Eulogio Bear, NP      PDMP not reviewed this encounter.   Eulogio Bear, NP 07/22/22 1438

## 2022-07-22 NOTE — ED Triage Notes (Addendum)
Pt reports nasal congestion, headache, intermittent runny nose, facial pressure for last several weeks. Pt reports blew nose and noticed "a lot of blood" on tissue. Has tried numerous otc medication and denies any improvement of symptoms.

## 2022-07-22 NOTE — Progress Notes (Signed)
You have been diverse  Office Visit Note   Patient: Mitchell Hughes           Date of Birth: March 24, 1966           MRN: [id number] Visit Date: 07/22/2022              Requested by: Carlena Hurl, PA-C [address] [place],  [place] [address] PCP: Carlena Hurl, PA-C   Assessment & Plan: Visit Diagnoses:  1. Nontraumatic complete tear of right rotator cuff   2. Impingement syndrome of right shoulder   3. Nontraumatic complete tear of left rotator cuff   4. Impingement syndrome of left shoulder     Plan: MRI findings reviewed and explained to the patient.  Recommendation is for arthroscopic repair of rotator cuff tear as well as distal clavicle excision subacromial decompression and extensive debridement.  His right shoulder is more symptomatic.  However given the timing he is unable to take time off work.  For now he will take it easy and be very careful and he anticipates that he will be able to have surgery sometime in the fall.  Follow-Up Instructions: No follow-ups on file.   Orders:  No orders of the defined types were placed in this encounter.  No orders of the defined types were placed in this encounter.     Procedures: No procedures performed   Clinical Data: No additional findings.   Subjective: Chief Complaint  Patient presents with   Right Shoulder - Follow-up    MRI review    HPI  Mitchell Hughes returns today to discuss bilateral shoulder MRI scans.  Review of Systems   Objective: Vital Signs: There were no vitals taken for this visit.  Physical Exam  Ortho Exam  Examination right shoulder shows pain and weakness with manual muscle testing of the supraspinatus and positive cross body adduction.  Examination of left shoulder shows pain and weakness with manual muscle testing supraspinatus and positive cross body adduction.  Specialty Comments:  No specialty comments available.  Imaging: No results found.   PMFS History: Patient Active  Problem List   Diagnosis Date Noted   Nontraumatic complete tear of right rotator cuff 07/22/2022   Impingement syndrome of right shoulder 07/22/2022   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   Benign prostatic hyperplasia with weak urinary stream 02/24/2022   Urinary hesitancy 02/24/2022   COVID-19 vaccine administered 02/24/2022   Screening for lipid disorders 02/24/2022   History of renal cell cancer 02/24/2022   History of renal stone 02/24/2022   Tendinitis of left triceps 01/21/2022   Renal stone 05/17/2021   Sepsis with acute organ dysfunction ([place]) 04/19/2021   Kidney stone 04/19/2021   Renal mass 04/19/2021   Splenomegaly 04/19/2021   Sepsis ([place]) 04/14/2021   Acute lower UTI 04/14/2021   Bilateral hand numbness 02/14/2021   Localized swelling on right hand 02/14/2021   Sleep disturbance 02/14/2021   Snoring 02/14/2021   Erectile dysfunction 02/14/2021   Need for influenza vaccination 02/14/2021   Impingement syndrome of left shoulder 06/20/2020   Primary osteoarthritis of first carpometacarpal joint of left hand 06/20/2020   Encounter for health maintenance examination in adult 02/17/2020   Screening for prostate cancer 02/17/2020   History of COVID-19 02/17/2020   Toenail deformity 02/17/2020   Neuropathy of foot, right 02/17/2020   Chronic pain of both shoulders 04/22/2019   Decreased range of motion of shoulder 04/22/2019   Degenerative arthritis of thumb, left 04/22/2019  Right inguinal hernia 01/28/2019   Aortic valve calcification 01/28/2019   Vaccine counseling 06/22/2017   Family history of heart disease 12/21/2013   Family history of colon cancer 12/21/2013   Nephrolithiasis 09/28/2009   Past Medical History:  Diagnosis Date   Arthritis    Erectile dysfunction    Family history of colon cancer    sister in 40s   Family history of heart disease 2011   baseline cardiac eval 2011 with Dr. Sallyanne Kuster   H/O echocardiogram 2015   Dr. Wynonia Lawman   H/O  echocardiogram 02/2019   History of exercise stress test    04/2014 Dr. Wynonia Lawman, prior 2011 normal Bruce treadmill stress test, Dr. Debara Pickett   History of kidney stones    LOW BACK PAIN, ACUTE 09/26/2009   OSA (obstructive sleep apnea) 01/2022   has cpap   TOBACCO USE, QUIT 09/26/2009   Wears glasses     Family History  Problem Relation Age of Onset   Thyroid disease Mother    Other Mother        brain disease, possibly dementia?   Gallstones Mother    Other Father        spinal cord injury/accident   Heart disease Father 64   Heart disease Sister 53       artificial valve   Colon cancer Sister    Sleep apnea Sister    Cancer Sister 41       colon   Sleep apnea Sister    Cancer Sister        pancreas   Pancreatic cancer Sister    Heart disease Sister    Heart disease Brother 75       MI   Diabetes Brother    Pancreatic cancer Brother    Heart disease Brother 19       pacemaker   Cancer Brother        liver, formerly pancreas   Heart disease Other        parent, other relative   Colon cancer Other    Stroke Neg Hx    Hypertension Neg Hx    Rectal cancer Neg Hx    Stomach cancer Neg Hx     Past Surgical History:  Procedure Laterality Date   COLONOSCOPY  08/2017   tubular adenoma, repeat 5 years; Dr. Milner Cellar   FOOT SURGERY     bone spur, right; Dr. Orlene Plum HERNIA REPAIR Bilateral 04/11/2022   Procedure: LAPAROSCOPIC BILATERAL INGUINAL HERNIA REPAIR with MESH;  Surgeon: Michael Boston, MD;  Location: [facility];  Service: General;  Laterality: Bilateral;  LOCAL   IR NEPHROSTOMY PLACEMENT RIGHT  04/14/2021   NEPHROLITHOTOMY Right 05/17/2021   Procedure: NEPHROLITHOTOMY PERCUTANEOUS insertion double j stent;  Surgeon: Alexis Frock, MD;  Location: [facility];  Service: Urology;  Laterality: Right;   ROBOT ASSISTED LAPAROSCOPIC NEPHRECTOMY Left 07/26/2021   Procedure: XI ROBOTIC ASSISTED LAPAROSCOPIC NEPHRECTOMY;  Surgeon: Alexis Frock, MD;  Location: [facility];   Service: Urology;  Laterality: Left;  3 HRS   UMBILICAL HERNIA REPAIR N/A 04/11/2022   Procedure: HERNIA REPAIR UMBILICAL ADULT;  Surgeon: Michael Boston, MD;  Location: [facility];  Service: General;  Laterality: N/A;  PRIMARY UMBILICAL HERNIA REPAIR   WISDOM TOOTH EXTRACTION     WISDOM TOOTH EXTRACTION     Social History   Occupational History   Not on file  Tobacco Use   Smoking status: Former    Packs/day: 0.25    Years:  30.00    Additional pack years: 0.00    Total pack years: 7.50    Types: Cigarettes    Quit date: 03/05/2009    Years since quitting: 13.3   Smokeless tobacco: Never   Tobacco comments:    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

## 2022-07-22 NOTE — Discharge Instructions (Addendum)
You have a bacterial sinus infection.  Take the Augmentin as prescribed.  Continue oral antihistamine and flonase nasal spray.    Some things that can make you feel better are: - Increased rest - Increasing fluid with water/sugar free electrolytes - Acetaminophen and ibuprofen as needed for fever/pain - Salt water gargling, chloraseptic spray and throat lozenges - OTC guaifenesin (Mucinex) 600 mg twice daily for congestion - Saline sinus flushes or a neti pot - Humidifying the air

## 2022-07-27 ENCOUNTER — Other Ambulatory Visit: Payer: Self-pay | Admitting: Medical

## 2022-07-28 NOTE — Telephone Encounter (Signed)
Pt has been taking this daily for nerve damage in his foot. Pt is going to be running out soon. He says only Mitchell Hughes as prescribed it for him

## 2022-07-28 NOTE — Telephone Encounter (Signed)
I don't understand how that is possible with the refill history. I sent in #30, no refills

## 2022-07-28 NOTE — Telephone Encounter (Signed)
This was last prescribed by Audelia Acton 08/2021 for #30 with 2 refills. It is unclear to me why he takes this. You will need to call the patient and see what he takes this for, has he been taking it ongoing, if he might be getting it from another doctor that he sees (because he hasn't been getting it regularly from [place]).

## 2022-07-28 NOTE — Telephone Encounter (Signed)
Left message for pt to call me back 

## 2022-07-31 NOTE — Telephone Encounter (Signed)
I was able to get a hold of the patient. He canceled his SS appt from 08/04/22 at 8 pm and r/s it for 10/14/22 at 9 pm.  Mailed new packet to the patient.

## 2022-08-02 DIAGNOSIS — G4733 Obstructive sleep apnea (adult) (pediatric): Secondary | ICD-10-CM | POA: Diagnosis not present

## 2022-08-08 ENCOUNTER — Telehealth: Payer: Self-pay | Admitting: Orthopaedic Surgery

## 2022-08-08 NOTE — Telephone Encounter (Signed)
Patient advising he would like to go ahead and have surgery for Bilateral Shoulders

## 2022-08-11 ENCOUNTER — Telehealth: Payer: Self-pay

## 2022-08-11 ENCOUNTER — Ambulatory Visit
Admission: EM | Admit: 2022-08-11 | Discharge: 2022-08-11 | Disposition: A | Discharge status: Home / Self Care | Payer: BC Managed Care – PPO | Attending: Nurse Practitioner | Admitting: Nurse Practitioner

## 2022-08-11 DIAGNOSIS — R197 Diarrhea, unspecified: Secondary | ICD-10-CM | POA: Diagnosis not present

## 2022-08-11 DIAGNOSIS — R112 Nausea with vomiting, unspecified: Secondary | ICD-10-CM | POA: Diagnosis not present

## 2022-08-11 MED ORDER — ONDANSETRON 4 MG PO TBDP
4.0000 mg | ORAL_TABLET | Freq: Once | ORAL | Status: AC
  Administered 2022-08-11: 4 mg via ORAL

## 2022-08-11 MED ORDER — ONDANSETRON 4 MG PO TBDP: 4.0000 mg | ORAL_TABLET | Freq: Three times a day (TID) | ORAL | 0 refills | Status: DC | PRN

## 2022-08-11 NOTE — Discharge Instructions (Signed)
As we discussed, I suspect you have a viral stomach bug.  This should improve over the next couple of days.    We have given you Zofran today to help prevent vomiting.  You can continue to take this at home every 8 hours as needed for nausea/vomiting.  The diarrhea should improve over the next couple of days.  Make sure you are drinking plenty of water or sugar free electrolyte solution like Pedialyte.   If you feel like eating, you can eat soft easy to digest foods like bananas, rice, applesauce, or dry toast.  It is okay if you do not eat for a couple of days as long as you are drinking plenty of fluids.

## 2022-08-11 NOTE — ED Provider Notes (Signed)
[facility]    CSN: [id number] Arrival date & time: 08/11/22  1639      History   Chief Complaint Chief Complaint  Patient presents with   Diarrhea    HPI Mitchell Hughes is a 57 y.o. male.   Patient presents today for 1 day history of bodyaches, chills, stuffy nose, headache, nausea, abdominal pain, and more than 10 episodes of diarrhea.  Denies blood in the stool.  Reports abdominal pain is currently 7 out of 10 and described as a "queasy" feeling.  No chest pain, shortness of breath, runny nose, sore throat, vomiting, new rash.  He has been more tired than normal today.  No known sick contacts.  Reports he traveled over the weekend to [place] and went to a concert.  Denies recent antibiotic use or recent suspicious drinking water exposure.  Denies dizziness or lightheadedness.  Has not taken anything for symptoms so far.  Reports he has been drinking plenty of water today.    Past Medical History:  Diagnosis Date   Arthritis    Erectile dysfunction    Family history of colon cancer    sister in 44s   Family history of heart disease 2011   baseline cardiac eval 2011 with Dr. Royann Shivers   H/O echocardiogram 2015   Dr. Donnie Aho   H/O echocardiogram 02/2019   History of exercise stress test    04/2014 Dr. Donnie Aho, prior 2011 normal Bruce treadmill stress test, Dr. Rennis Golden   History of kidney stones    LOW BACK PAIN, ACUTE 09/26/2009   OSA (obstructive sleep apnea) 01/2022   has cpap   TOBACCO USE, QUIT 09/26/2009   Wears glasses     Patient Active Problem List   Diagnosis Date Noted   Nontraumatic complete tear of right rotator cuff 07/22/2022   Impingement syndrome of right shoulder 07/22/2022   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   Benign prostatic hyperplasia with weak urinary stream 02/24/2022   Urinary hesitancy 02/24/2022   COVID-19 vaccine administered 02/24/2022   Screening for lipid disorders 02/24/2022   History of renal cell cancer 02/24/2022    History of renal stone 02/24/2022   Tendinitis of left triceps 01/21/2022   Renal stone 05/17/2021   Sepsis with acute organ dysfunction 04/19/2021   Kidney stone 04/19/2021   Renal mass 04/19/2021   Splenomegaly 04/19/2021   Sepsis 04/14/2021   Acute lower UTI 04/14/2021   Bilateral hand numbness 02/14/2021   Localized swelling on right hand 02/14/2021   Sleep disturbance 02/14/2021   Snoring 02/14/2021   Erectile dysfunction 02/14/2021   Need for influenza vaccination 02/14/2021   Impingement syndrome of left shoulder 06/20/2020   Primary osteoarthritis of first carpometacarpal joint of left hand 06/20/2020   Encounter for health maintenance examination in adult 02/17/2020   Screening for prostate cancer 02/17/2020   History of COVID-19 02/17/2020   Toenail deformity 02/17/2020   Neuropathy of foot, right 02/17/2020   Chronic pain of both shoulders 04/22/2019   Decreased range of motion of shoulder 04/22/2019   Degenerative arthritis of thumb, left 04/22/2019   Right inguinal hernia 01/28/2019   Aortic valve calcification 01/28/2019   Vaccine counseling 06/22/2017   Family history of heart disease 12/21/2013   Family history of colon cancer 12/21/2013   Nephrolithiasis 09/28/2009    Past Surgical History:  Procedure Laterality Date   COLONOSCOPY  08/2017   tubular adenoma, repeat 5 years; Dr. Ileene Patrick   FOOT SURGERY  bone spur, right; Dr. Rolland Bimler HERNIA REPAIR Bilateral 04/11/2022   Procedure: LAPAROSCOPIC BILATERAL INGUINAL HERNIA REPAIR with MESH;  Surgeon: Karie Soda, MD;  Location: [facility];  Service: General;  Laterality: Bilateral;  LOCAL   IR NEPHROSTOMY PLACEMENT RIGHT  04/14/2021   NEPHROLITHOTOMY Right 05/17/2021   Procedure: NEPHROLITHOTOMY PERCUTANEOUS insertion double j stent;  Surgeon: Sebastian Ache, MD;  Location: [facility];  Service: Urology;  Laterality: Right;   ROBOT ASSISTED LAPAROSCOPIC NEPHRECTOMY Left 07/26/2021   Procedure:  XI ROBOTIC ASSISTED LAPAROSCOPIC NEPHRECTOMY;  Surgeon: Sebastian Ache, MD;  Location: [facility];  Service: Urology;  Laterality: Left;  3 HRS   UMBILICAL HERNIA REPAIR N/A 04/11/2022   Procedure: HERNIA REPAIR UMBILICAL ADULT;  Surgeon: Karie Soda, MD;  Location: [facility];  Service: General;  Laterality: N/A;  PRIMARY UMBILICAL HERNIA REPAIR   WISDOM TOOTH EXTRACTION     WISDOM TOOTH EXTRACTION         Home Medications    Prior to Admission medications   Medication Sig Start Date End Date Taking? Authorizing Provider  gabapentin (NEURONTIN) 300 MG capsule TAKE 1 CAPSULE(300 MG) BY MOUTH AT BEDTIME 07/28/22  Yes Joselyn Arrow, MD  ondansetron (ZOFRAN-ODT) 4 MG disintegrating tablet Take 1 tablet (4 mg total) by mouth every 8 (eight) hours as needed for nausea or vomiting. 08/11/22  Yes Valentino Nose, NP  tadalafil (CIALIS) 5 MG tablet TAKE 1 TABLET(5 MG) BY MOUTH DAILY 06/03/22  Yes Tysinger, Kermit Balo, PA-C  traMADol (ULTRAM) 50 MG tablet Take 1 tablet (50 mg total) by mouth 3 (three) times daily as needed. 06/18/22  Yes Cristie Hem, PA-C  traZODone (DESYREL) 50 MG tablet TAKE 1 TABLET(50 MG) BY MOUTH AT BEDTIME 05/07/22  Yes Tysinger, Kermit Balo, PA-C  HYDROcodone-acetaminophen (NORCO) 5-325 MG tablet Take 1 tablet by mouth every 6 (six) hours as needed. Patient taking differently: Take 1 tablet by mouth as needed. 04/11/22   Karie Soda, MD  mupirocin ointment (BACTROBAN) 2 % Apply 1 Application topically 2 (two) times daily. Apply twice daily until healed 04/22/22   Cristie Hem, PA-C    Family History Family History  Problem Relation Age of Onset   Thyroid disease Mother    Other Mother        brain disease, possibly dementia?   Gallstones Mother    Other Father        spinal cord injury/accident   Heart disease Father 94   Heart disease Sister 42       artificial valve   Colon cancer Sister    Sleep apnea Sister    Cancer Sister 27       colon   Sleep apnea Sister     Cancer Sister        pancreas   Pancreatic cancer Sister    Heart disease Sister    Heart disease Brother 51       MI   Diabetes Brother    Pancreatic cancer Brother    Heart disease Brother 67       pacemaker   Cancer Brother        liver, formerly pancreas   Heart disease Other        parent, other relative   Colon cancer Other    Stroke Neg Hx    Hypertension Neg Hx    Rectal cancer Neg Hx    Stomach cancer Neg Hx     Social History Social History   Tobacco  Use   Smoking status: Former    Packs/day: 0.25    Years: 30.00    Additional pack years: 0.00    Total pack years: 7.50    Types: Cigarettes    Quit date: 03/05/2009    Years since quitting: 13.4   Smokeless tobacco: Never   Tobacco comments:    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   Review of Systems Review of Systems Per HPI  Physical Exam Triage Vital Signs ED Triage Vitals  Enc Vitals Group     BP 08/11/22 1715 (!) 149/87     Pulse Rate 08/11/22 1715 90     Resp 08/11/22 1715 18     Temp 08/11/22 1715 98.5 F (36.9 C)     Temp Source 08/11/22 1715 Oral     SpO2 08/11/22 1715 96 %     Weight --      Height --      Head Circumference --      Peak Flow --      Pain Score 08/11/22 1716 0     Pain Loc --      Pain Edu? --      Excl. in GC? --    No data found.  Updated Vital Signs BP (!) 149/87 (BP Location: Right Arm)   Pulse 90   Temp 98.5 F (36.9 C) (Oral)   Resp 18   SpO2 96%   Visual Acuity Right Eye Distance:   Left Eye Distance:   Bilateral Distance:    Right Eye Near:   Left Eye Near:    Bilateral Near:     Physical Exam Vitals and nursing note reviewed.  Constitutional:      General: He  is not in acute distress.    Appearance: Normal appearance. He is not toxic-appearing.  HENT:     Head: Normocephalic and atraumatic.     Mouth/Throat:     Mouth: Mucous membranes are moist.     Pharynx: Oropharynx is clear. No posterior oropharyngeal erythema.  Cardiovascular:     Rate and Rhythm: Normal rate and regular rhythm.  Pulmonary:     Effort: Pulmonary effort is normal. No respiratory distress.     Breath sounds: Normal breath sounds. No wheezing, rhonchi or rales.  Abdominal:     General: Abdomen is flat. Bowel sounds are normal. There is no distension.     Palpations: Abdomen is soft.     Tenderness: There is no abdominal tenderness. There is no right CVA tenderness, left CVA tenderness, guarding or rebound.  Musculoskeletal:     Cervical back: Normal range of motion.  Lymphadenopathy:     Cervical: No cervical adenopathy.  Skin:    General: Skin is warm and dry.     Capillary Refill: Capillary refill takes less than 2 seconds.     Coloration: Skin is not jaundiced or pale.     Findings: No erythema.  Neurological:     Mental Status: He is alert.     Motor: No weakness.     Gait: Gait normal.  Psychiatric:        Behavior: Behavior  is cooperative.      UC Treatments / Results  Labs (all labs ordered are listed, but only abnormal results are displayed) Labs Reviewed - No data to display  EKG   Radiology No results found.  Procedures Procedures (including critical care time)  Medications Ordered in UC Medications  ondansetron (ZOFRAN-ODT) disintegrating tablet 4 mg (4 mg Oral Given 08/11/22 1731)    Initial Impression / Assessment and Plan / UC Course  I have reviewed the triage vital signs and the nursing notes.  Pertinent labs & imaging results that were available during my care of the patient were reviewed by me and considered in my medical decision making (see chart for details).   Patient is well-appearing, normotensive, afebrile, not  tachycardic, not tachypneic, oxygenating well on room air.    1. Diarrhea, unspecified type 2. Nausea and vomiting, unspecified vomiting type Noted flags in history or on exam today Vital signs are stable Suspect viral gastroenteritis Supportive care discussed Zofran 4 mg ODT given in urgent care today with relief of nausea, patient able to tolerate fluids without vomiting Zofran 4 mg sent to pharmacy Discussed brat diet, other supportive measures including pushing hydration Strict ER precautions with patient Note given for work  The patient was given the opportunity to ask questions.  All questions answered to their satisfaction.  The patient is in agreement to this plan.    Final Clinical Impressions(s) / UC Diagnoses   Final diagnoses:  Diarrhea, unspecified type  Nausea and vomiting, unspecified vomiting type     Discharge Instructions      As we discussed, I suspect you have a viral stomach bug.  This should improve over the next couple of days.    We have given you Zofran today to help prevent vomiting.  You can continue to take this at home every 8 hours as needed for nausea/vomiting.  The diarrhea should improve over the next couple of days.  Make sure you are drinking plenty of water or sugar free electrolyte solution like Pedialyte.   If you feel like eating, you can eat soft easy to digest foods like bananas, rice, applesauce, or dry toast.  It is okay if you do not eat for a couple of days as long as you are drinking plenty of fluids.      ED Prescriptions     Medication Sig Dispense Auth. Provider   ondansetron (ZOFRAN-ODT) 4 MG disintegrating tablet Take 1 tablet (4 mg total) by mouth every 8 (eight) hours as needed for nausea or vomiting. 20 tablet Valentino NoseMartinez, Myrlene Riera A, NP      PDMP not reviewed this encounter.   Valentino NoseMartinez, Aislee Landgren A, NP 08/11/22 [phone]

## 2022-08-11 NOTE — ED Triage Notes (Signed)
Diarrhea, chills, body aches, that started today. Not taking any OTC medication right now.

## 2022-08-12 ENCOUNTER — Telehealth: Payer: Self-pay | Admitting: Orthopaedic Surgery

## 2022-08-12 NOTE — Telephone Encounter (Signed)
Patient would like to proceed with shoulder surgery -starting with the right shoulder in June and proceed to having left shoulder in December (or sooner) if possible BUT DEFINITELY IN THE SAME YEAR.  Please provide surgery sheet if surgery is in order. If any questions regarding this arrangement please contact patient at  [phone].

## 2022-08-25 NOTE — Telephone Encounter (Signed)
Updated [employer] auth.  CPAP- [employer] Berkley Harvey: [id number] (exp. 08/25/22 to 10/23/22)

## 2022-08-28 ENCOUNTER — Ambulatory Visit (INDEPENDENT_AMBULATORY_CARE_PROVIDER_SITE_OTHER): Payer: BC Managed Care – PPO | Admitting: Medical

## 2022-08-28 ENCOUNTER — Encounter: Payer: Self-pay | Admitting: Medical

## 2022-08-28 VITALS — BP 136/76 | HR 81 | Wt 225.6 lb

## 2022-08-28 DIAGNOSIS — R2689 Other abnormalities of gait and mobility: Secondary | ICD-10-CM

## 2022-08-28 DIAGNOSIS — M79671 Pain in right foot: Secondary | ICD-10-CM | POA: Diagnosis not present

## 2022-08-28 DIAGNOSIS — Z9889 Other specified postprocedural states: Secondary | ICD-10-CM | POA: Diagnosis not present

## 2022-08-28 DIAGNOSIS — R200 Anesthesia of skin: Secondary | ICD-10-CM

## 2022-08-28 DIAGNOSIS — G5791 Unspecified mononeuropathy of right lower limb: Secondary | ICD-10-CM

## 2022-08-28 DIAGNOSIS — J301 Allergic rhinitis due to pollen: Secondary | ICD-10-CM

## 2022-08-28 DIAGNOSIS — J0110 Acute frontal sinusitis, unspecified: Secondary | ICD-10-CM

## 2022-08-28 DIAGNOSIS — R0989 Other specified symptoms and signs involving the circulatory and respiratory systems: Secondary | ICD-10-CM

## 2022-08-28 DIAGNOSIS — R202 Paresthesia of skin: Secondary | ICD-10-CM

## 2022-08-28 DIAGNOSIS — I8393 Asymptomatic varicose veins of bilateral lower extremities: Secondary | ICD-10-CM

## 2022-08-28 MED ORDER — CEFUROXIME AXETIL 500 MG PO TABS: 500.0000 mg | ORAL_TABLET | Freq: Two times a day (BID) | ORAL | 0 refills | Status: AC

## 2022-08-28 MED ORDER — PREDNISONE 10 MG PO TABS: ORAL_TABLET | ORAL | 0 refills | Status: AC

## 2022-08-28 MED ORDER — GABAPENTIN 300 MG PO CAPS: ORAL_CAPSULE | ORAL | 3 refills | Status: AC

## 2022-08-28 NOTE — Progress Notes (Signed)
Subjective: Chief Complaint  Patient presents with   Acute Visit    Lump remaining after hernia surgery, nerve pain in foot, sinus problems--went to urgent care and was treated for sinus infection but still having issues, need refill of gabapentin   Here for multiple concerns  He has a history of multiple surgeries to the right foot in the past.  For a while now has had numbness in the foot, has been using gabapentin.  He ran out of gabapentin a month ago and the symptoms are worse.  Gabapentin was helping, 300 mg nightly.  He states known from the lower leg down to the whole foot.  Lately he has had some stumbling and even bumped his toe because he cannot feel sensation in the foot.  He is concerned about a hernia.  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  He had subsequent hernia surgery back in December 2023 including bilateral inguinal hernia surgery and umbilical hernia repair.  In the last month or so he has a new bulge in the middle of his abdomen under the surgical scar that he thinks could be a new hernia or other lump.  It is sometimes tender  He has allergy problems with runny nose, sneezing, drippy nose, itchy eyes.  Currently using Flonase.  Not using any other allergy medicine right now  He has concerns about chronic sinus problems.  He was seen by urgent care about a month ago for sinus infection, treated with Augmentin.  He helped briefly but now the symptoms are just as bad including sinus pressure, teeth pressure, stuffy in the head, fatigue. No fever, no bodyaches or chills, no sore throat, no cough  ROS as in subjective   Past Medical History:  Diagnosis Date   Arthritis    Erectile dysfunction    Family history of colon cancer    sister in 30s   Family history of heart disease 2011   baseline cardiac eval 2011 with Dr. Royann Shivers   H/O echocardiogram 2015   Dr. Donnie Aho   H/O echocardiogram 02/2019   History of exercise stress test    04/2014 Dr.  Donnie Aho, prior 2011 normal Bruce treadmill stress test, Dr. Rennis Golden   History of kidney stones    LOW BACK PAIN, ACUTE 09/26/2009   OSA (obstructive sleep apnea) 01/2022   has cpap   TOBACCO USE, QUIT 09/26/2009   Wears glasses    Current Outpatient Medications on File Prior to Visit  Medication Sig Dispense Refill   HYDROcodone-acetaminophen (NORCO) 5-325 MG tablet Take 1 tablet by mouth every 6 (six) hours as needed. (Patient taking differently: Take 1 tablet by mouth as needed.) 20 tablet 0   tadalafil (CIALIS) 5 MG tablet TAKE 1 TABLET(5 MG) BY MOUTH DAILY 90 tablet 0   traMADol (ULTRAM) 50 MG tablet Take 1 tablet (50 mg total) by mouth 3 (three) times daily as needed. 30 tablet 2   traZODone (DESYREL) 50 MG tablet TAKE 1 TABLET(50 MG) BY MOUTH AT BEDTIME 90 tablet 0   mupirocin ointment (BACTROBAN) 2 % Apply 1 Application topically 2 (two) times daily. Apply twice daily until healed (Patient not taking: Reported on 08/28/2022) 22 g 0   ondansetron (ZOFRAN-ODT) 4 MG disintegrating tablet Take 1 tablet (4 mg total) by mouth every 8 (eight) hours as needed for nausea or vomiting. 20 tablet 0   Current Facility-Administered Medications on File Prior to Visit  Medication Dose Route Frequency Provider Last Rate Last  Admin   polyethylene glycol powder (GLYCOLAX/MIRALAX) container 255 g  1 Container Oral Once Loletta Parish., MD       Past Surgical History:  Procedure Laterality Date   COLONOSCOPY  08/2017   tubular adenoma, repeat 5 years; Dr. Ileene Patrick   FOOT SURGERY     bone spur, right; Dr. Rolland Bimler HERNIA REPAIR Bilateral 04/11/2022   Procedure: LAPAROSCOPIC BILATERAL INGUINAL HERNIA REPAIR with MESH;  Surgeon: Karie Soda, MD;  Location: [facility];  Service: General;  Laterality: Bilateral;  LOCAL   IR NEPHROSTOMY PLACEMENT RIGHT  04/14/2021   NEPHROLITHOTOMY Right 05/17/2021   Procedure: NEPHROLITHOTOMY PERCUTANEOUS insertion double j stent;  Surgeon: Sebastian Ache, MD;  Location: [facility];  Service: Urology;  Laterality: Right;   ROBOT ASSISTED LAPAROSCOPIC NEPHRECTOMY Left 07/26/2021   Procedure: XI ROBOTIC ASSISTED LAPAROSCOPIC NEPHRECTOMY;  Surgeon: Sebastian Ache, MD;  Location: [facility];  Service: Urology;  Laterality: Left;  3 HRS   UMBILICAL HERNIA REPAIR N/A 04/11/2022   Procedure: HERNIA REPAIR UMBILICAL ADULT;  Surgeon: Karie Soda, MD;  Location: [facility];  Service: General;  Laterality: N/A;  PRIMARY UMBILICAL HERNIA REPAIR   WISDOM TOOTH EXTRACTION     WISDOM TOOTH EXTRACTION       Objective: BP 136/76   Pulse 81   Wt 225 lb 9.6 oz (102.3 kg)   SpO2 97%   BMI 26.75 kg/m   General appearence: alert, no distress, WD/WN,  HEENT: normocephalic, sclerae anicteric, TMs flat, nares with mucoid discharge, + erythema, pharynx normal Oral cavity: MMM, no lesions Neck: supple, no lymphadenopathy, no thyromegaly, no masses Heart: RRR, normal S1, S2, no murmurs Lungs: CTA bilaterally, no wheezes, rhonchi, or rales Abdomen: +bs, soft, about 4-5 port surgical scars around the abdomen, there is a linear ventral surgical scar in the mid abdomen superior to the umbilicus but with Valsalva there is about a 4 cm bulge in the lower part of the ventral surgical scar that could be a hernia, somewhat tender but not bulging through, reducible , non tender, non distended, no masses, no hepatomegaly, no splenomegaly Moderate varicose veins both lower legs No obvious edema of the legs Right pedal pulses are 1+ compared to 2+ on the left, cap refill seems within normal limits Right foot with decreased sensation throughout, there is several surgical scars from prior, right great toe medial corner with some dried blood from where he had a recent abrasion or trauma   Assessment: Encounter Diagnoses  Name Primary?   Neuropathy of right foot Yes   Foot pain, right    Numbness and tingling of foot    History of foot surgery    Varicose veins of both lower  extremities, unspecified whether complicated    Balance problem    Decreased pedal pulses    Allergic rhinitis due to pollen, unspecified seasonality    Acute non-recurrent frontal sinusitis      Plan: Neuropathy of foot, right foot pain-referral to podiatry for further evaluation and treatment recommendations.  Restart gabapentin nightly.  Consider ABI blood flow study.  Referral to physical therapy as well  Varicose veins-continue with regular exercise, consider compression hose daily  Balance issues along with his neuropathy of the foot-referral to physical therapy  Decreased pedal pulses in right foot-consider ABI blood flow screening, but will defer to podiatry since he was seen on first  History of hernia surgery in December 2023, possible new hernia under the ventral surgical scar.  I will  send a note to general surgery as he may need to follow-up with them  Allergic rhinitis-continue Flonase but restart allergy pill at bedtime  Sinusitis-begin Ceftin and prednisone as below, Mucinex, good hydration   Jovonni was seen today for acute visit.  Diagnoses and all orders for this visit:  Neuropathy of right foot -     Ambulatory referral to Podiatry -     Ambulatory referral to Physical Therapy  Foot pain, right -     Ambulatory referral to Podiatry -     Ambulatory referral to Physical Therapy  Numbness and tingling of foot -     Ambulatory referral to Podiatry -     Ambulatory referral to Physical Therapy  History of foot surgery -     Ambulatory referral to Podiatry -     Ambulatory referral to Physical Therapy  Varicose veins of both lower extremities, unspecified whether complicated -     Ambulatory referral to Podiatry -     Ambulatory referral to Physical Therapy  Balance problem -     Ambulatory referral to Podiatry -     Ambulatory referral to Physical Therapy  Decreased pedal pulses  Allergic rhinitis due to pollen, unspecified seasonality  Acute  non-recurrent frontal sinusitis  Other orders -     gabapentin (NEURONTIN) 300 MG capsule; TAKE 1 CAPSULE(300 MG) BY MOUTH AT BEDTIME -     cefUROXime (CEFTIN) 500 MG tablet; Take 1 tablet (500 mg total) by mouth 2 (two) times daily with a meal for 10 days. -     predniSONE (DELTASONE) 10 MG tablet; 6 tablets all together day 1, 5 tablets day 2, 4 tablets day 3, 3 tablets day 4, 2 tablets day 5, 1 tablet day 6.    F/u pending referrals

## 2022-09-01 ENCOUNTER — Encounter: Payer: Self-pay | Admitting: Podiatry

## 2022-09-01 ENCOUNTER — Ambulatory Visit (INDEPENDENT_AMBULATORY_CARE_PROVIDER_SITE_OTHER): Payer: BC Managed Care – PPO | Admitting: Podiatry

## 2022-09-01 ENCOUNTER — Ambulatory Visit (INDEPENDENT_AMBULATORY_CARE_PROVIDER_SITE_OTHER): Payer: BC Managed Care – PPO

## 2022-09-01 DIAGNOSIS — T8484XA Pain due to internal orthopedic prosthetic devices, implants and grafts, initial encounter: Secondary | ICD-10-CM

## 2022-09-01 DIAGNOSIS — G5791 Unspecified mononeuropathy of right lower limb: Secondary | ICD-10-CM | POA: Diagnosis not present

## 2022-09-01 DIAGNOSIS — Z9889 Other specified postprocedural states: Secondary | ICD-10-CM

## 2022-09-01 DIAGNOSIS — M216X1 Other acquired deformities of right foot: Secondary | ICD-10-CM

## 2022-09-01 NOTE — Progress Notes (Unsigned)
Subjective:   Patient ID: Mitchell Hughes, male   DOB: 57 y.o.   MRN: [id number]   HPI Chief Complaint  Patient presents with   Peripheral Neuropathy    np Neuropathy of right foot - Foot pain, right Numbness and tingling of foot History of foot surgery - he has 2 small screws in toes 2 and 4 that are very painful at times - Varicose veins of both lower extremities, unspecified whether complicated - Balance problem     57 year old male presents the office for above concerns.  He states his foot feels like a cinder block.  He has had a history of foot surgery he has screws in his foot he also had return to the OR for exploration of neuroma as well as an anterior tarsal tunnel release.  He states the symptoms are starting up the leg.  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  He does not have the symptoms on the contralateral extremity. He has been having some balance issues as well. He did see his primary care doctor and physical therapy was ordered but he has not yet started this.  He is currently on gabapentin.   Review of Systems  All other systems reviewed and are negative.   Past Medical History:  Diagnosis Date   Arthritis    Erectile dysfunction    Family history of colon cancer    sister in 42s   Family history of heart disease 2011   baseline cardiac eval 2011 with Dr. Royann Shivers   H/O echocardiogram 2015   Dr. Donnie Aho   H/O echocardiogram 02/2019   History of exercise stress test    04/2014 Dr. Donnie Aho, prior 2011 normal [facility] treadmill stress test, Dr. Rennis Golden   History of kidney stones    LOW BACK PAIN, ACUTE 09/26/2009   OSA (obstructive sleep apnea) 01/2022   has cpap   TOBACCO USE, QUIT 09/26/2009   Wears glasses     Past Surgical History:  Procedure Laterality Date   COLONOSCOPY  08/2017   tubular adenoma, repeat 5 years; Dr. Ileene Patrick   FOOT SURGERY     bone spur, right; Dr. Rolland Bimler HERNIA REPAIR Bilateral 04/11/2022   Procedure: LAPAROSCOPIC BILATERAL INGUINAL HERNIA REPAIR with MESH;  Surgeon: Karie Soda, MD;  Location: [facility];  Service: General;  Laterality: Bilateral;  LOCAL   IR NEPHROSTOMY PLACEMENT RIGHT  04/14/2021   NEPHROLITHOTOMY Right 05/17/2021   Procedure: NEPHROLITHOTOMY PERCUTANEOUS insertion double j stent;  Surgeon: Sebastian Ache, MD;  Location: [facility];  Service: Urology;  Laterality: Right;   ROBOT ASSISTED LAPAROSCOPIC NEPHRECTOMY Left 07/26/2021   Procedure: XI ROBOTIC ASSISTED LAPAROSCOPIC NEPHRECTOMY;  Surgeon: Sebastian Ache, MD;  Location: [facility];  Service: Urology;  Laterality: Left;  3 HRS   UMBILICAL HERNIA REPAIR N/A 04/11/2022   Procedure: HERNIA REPAIR UMBILICAL ADULT;  Surgeon: Karie Soda, MD;  Location: [facility];  Service: General;  Laterality: N/A;  PRIMARY UMBILICAL HERNIA REPAIR   WISDOM TOOTH EXTRACTION     WISDOM TOOTH EXTRACTION       Current Outpatient Medications:    desonide (DESOWEN) 0.05 % cream, Apply topically., Disp: , Rfl:    cefUROXime (CEFTIN) 500 MG tablet, Take 1 tablet (500 mg total) by mouth 2 (two) times daily with a meal for 10 days., Disp: 20 tablet, Rfl: 0   gabapentin (NEURONTIN) 300 MG capsule, TAKE  1 CAPSULE(300 MG) BY MOUTH AT BEDTIME, Disp: 90 capsule, Rfl: 3   HYDROcodone-acetaminophen (NORCO) 5-325 MG tablet, Take 1 tablet by mouth every 6 (six) hours as needed. (Patient taking differently: Take 1 tablet by mouth as needed.), Disp: 20 tablet, Rfl: 0   mupirocin ointment (BACTROBAN) 2 %, Apply 1 Application topically 2 (two) times daily. Apply twice daily until healed (Patient not taking: Reported on 08/28/2022), Disp: 22 g, Rfl: 0   predniSONE (DELTASONE) 10 MG tablet, 6 tablets all together day 1, 5 tablets day 2, 4 tablets day 3, 3 tablets day 4, 2 tablets day 5, 1 tablet day 6., Disp: 21 tablet, Rfl: 0   tadalafil (CIALIS) 5 MG tablet, TAKE 1 TABLET(5 MG) BY MOUTH DAILY, Disp: 90 tablet, Rfl: 0    traMADol (ULTRAM) 50 MG tablet, Take 1 tablet (50 mg total) by mouth 3 (three) times daily as needed., Disp: 30 tablet, Rfl: 2   traZODone (DESYREL) 50 MG tablet, TAKE 1 TABLET(50 MG) BY MOUTH AT BEDTIME, Disp: 90 tablet, Rfl: 0 No current facility-administered medications for this visit.  Facility-Administered Medications Ordered in Other Visits:    polyethylene glycol powder (GLYCOLAX/MIRALAX) container 255 g, 1 Container, Oral, Once, Manny, Delbert Phenix., MD  No Known Allergies       Objective:  Physical Exam  General: AAO x3, NAD  Dermatological: Incisions of the surgery are well-healed.  Vascular: Dorsalis Pedis artery and Posterior Tibial artery pedal pulses are 2/4 bilateral with immedate capillary fill time. Pedal hair growth present.  There is no pain with calf compression, swelling, warmth, erythema.   Neruologic: Sensation mildly decreased with Semmes Weinstein monofilament.  Negative Tinel sign.  Musculoskeletal: Mild diffuse discomfort along the dorsal forefoot.  There is no area of pinpoint tenderness.  Flexor, extensor tendons are intact.  MMT 5/5.  Prominence of metatarsal heads plantarly with atrophy of the fat pad.  Gait: Unassisted, Nonantalgic.       Assessment:   Neuropathy right lower extremity     Plan:  -Treatment options discussed including all alternatives, risks, and complications -Etiology of symptoms were discussed -X-rays were obtained and reviewed with the patient.  3 views of the foot were obtained.  No evidence of acute fracture.  Hardware intact of the third and fourth metatarsals from prior surgery. -I think symptoms clinically are coming more from the prominent metatarsal heads.  I do not think the hardware is prominent causing the pain.  Concerned about the nerve issues.  I will order new nerve conduction test.  Also will order an MRI of the foot.  He is to start physical therapy for gait training.  Vivi Barrack DPM

## 2022-09-02 DIAGNOSIS — G4733 Obstructive sleep apnea (adult) (pediatric): Secondary | ICD-10-CM | POA: Diagnosis not present

## 2022-09-02 DIAGNOSIS — N200 Calculus of kidney: Secondary | ICD-10-CM | POA: Diagnosis not present

## 2022-09-02 DIAGNOSIS — Z905 Acquired absence of kidney: Secondary | ICD-10-CM | POA: Diagnosis not present

## 2022-09-02 DIAGNOSIS — N1831 Chronic kidney disease, stage 3a: Secondary | ICD-10-CM | POA: Diagnosis not present

## 2022-09-02 LAB — LAB REPORT - SCANNED: EGFR: 51

## 2022-09-03 DIAGNOSIS — R2689 Other abnormalities of gait and mobility: Secondary | ICD-10-CM | POA: Diagnosis not present

## 2022-09-03 DIAGNOSIS — M79671 Pain in right foot: Secondary | ICD-10-CM | POA: Diagnosis not present

## 2022-09-10 DIAGNOSIS — M79671 Pain in right foot: Secondary | ICD-10-CM | POA: Diagnosis not present

## 2022-09-10 DIAGNOSIS — R2689 Other abnormalities of gait and mobility: Secondary | ICD-10-CM | POA: Diagnosis not present

## 2022-09-15 DIAGNOSIS — M79671 Pain in right foot: Secondary | ICD-10-CM | POA: Diagnosis not present

## 2022-09-15 DIAGNOSIS — R2689 Other abnormalities of gait and mobility: Secondary | ICD-10-CM | POA: Diagnosis not present

## 2022-09-22 DIAGNOSIS — K432 Incisional hernia without obstruction or gangrene: Secondary | ICD-10-CM | POA: Diagnosis not present

## 2022-09-26 NOTE — Progress Notes (Signed)
Pt has already been see for his hernia on 09/22/22 at [facility]

## 2022-09-30 ENCOUNTER — Other Ambulatory Visit: Payer: Self-pay | Admitting: Medical

## 2022-09-30 DIAGNOSIS — R2689 Other abnormalities of gait and mobility: Secondary | ICD-10-CM | POA: Diagnosis not present

## 2022-09-30 DIAGNOSIS — M79671 Pain in right foot: Secondary | ICD-10-CM | POA: Diagnosis not present

## 2022-10-02 DIAGNOSIS — G4733 Obstructive sleep apnea (adult) (pediatric): Secondary | ICD-10-CM | POA: Diagnosis not present

## 2022-10-03 ENCOUNTER — Telehealth: Payer: Self-pay | Admitting: Orthopaedic Surgery

## 2022-10-03 DIAGNOSIS — R2689 Other abnormalities of gait and mobility: Secondary | ICD-10-CM | POA: Diagnosis not present

## 2022-10-03 DIAGNOSIS — M79671 Pain in right foot: Secondary | ICD-10-CM | POA: Diagnosis not present

## 2022-10-03 NOTE — Telephone Encounter (Signed)
[employer] forms received. To [employer]. 

## 2022-10-07 ENCOUNTER — Other Ambulatory Visit: Payer: Self-pay | Admitting: Physician Assistant

## 2022-10-07 MED ORDER — METHOCARBAMOL 750 MG PO TABS: 750.0000 mg | ORAL_TABLET | Freq: Two times a day (BID) | ORAL | 0 refills | Status: AC | PRN

## 2022-10-07 MED ORDER — ONDANSETRON HCL 4 MG PO TABS: 4.0000 mg | ORAL_TABLET | Freq: Three times a day (TID) | ORAL | 0 refills | Status: AC | PRN

## 2022-10-07 MED ORDER — OXYCODONE-ACETAMINOPHEN 5-325 MG PO TABS: 1.0000 | ORAL_TABLET | Freq: Four times a day (QID) | ORAL | 0 refills | Status: DC | PRN

## 2022-10-08 ENCOUNTER — Telehealth: Payer: Self-pay | Admitting: Orthopaedic Surgery

## 2022-10-08 NOTE — Telephone Encounter (Signed)
Received $25.00 cash & auth for [employer] forms. To [employer]

## 2022-10-09 ENCOUNTER — Encounter: Payer: Self-pay | Admitting: Orthopaedic Surgery

## 2022-10-09 DIAGNOSIS — S43431A Superior glenoid labrum lesion of right shoulder, initial encounter: Secondary | ICD-10-CM | POA: Diagnosis not present

## 2022-10-09 DIAGNOSIS — M7541 Impingement syndrome of right shoulder: Secondary | ICD-10-CM | POA: Diagnosis not present

## 2022-10-09 DIAGNOSIS — M659 Synovitis and tenosynovitis, unspecified: Secondary | ICD-10-CM | POA: Diagnosis not present

## 2022-10-09 DIAGNOSIS — X58XXXA Exposure to other specified factors, initial encounter: Secondary | ICD-10-CM | POA: Diagnosis not present

## 2022-10-09 DIAGNOSIS — M75121 Complete rotator cuff tear or rupture of right shoulder, not specified as traumatic: Secondary | ICD-10-CM | POA: Diagnosis not present

## 2022-10-09 DIAGNOSIS — M67813 Other specified disorders of tendon, right shoulder: Secondary | ICD-10-CM | POA: Diagnosis not present

## 2022-10-09 DIAGNOSIS — G8918 Other acute postprocedural pain: Secondary | ICD-10-CM | POA: Diagnosis not present

## 2022-10-09 DIAGNOSIS — M94261 Chondromalacia, right knee: Secondary | ICD-10-CM | POA: Diagnosis not present

## 2022-10-09 DIAGNOSIS — M75101 Unspecified rotator cuff tear or rupture of right shoulder, not specified as traumatic: Secondary | ICD-10-CM | POA: Diagnosis not present

## 2022-10-09 DIAGNOSIS — M19011 Primary osteoarthritis, right shoulder: Secondary | ICD-10-CM | POA: Diagnosis not present

## 2022-10-09 DIAGNOSIS — M7521 Bicipital tendinitis, right shoulder: Secondary | ICD-10-CM | POA: Diagnosis not present

## 2022-10-09 DIAGNOSIS — M75111 Incomplete rotator cuff tear or rupture of right shoulder, not specified as traumatic: Secondary | ICD-10-CM | POA: Diagnosis not present

## 2022-10-09 DIAGNOSIS — Y999 Unspecified external cause status: Secondary | ICD-10-CM | POA: Diagnosis not present

## 2022-10-14 ENCOUNTER — Ambulatory Visit (INDEPENDENT_AMBULATORY_CARE_PROVIDER_SITE_OTHER): Payer: BC Managed Care – PPO | Admitting: Neurology

## 2022-10-14 DIAGNOSIS — G4733 Obstructive sleep apnea (adult) (pediatric): Secondary | ICD-10-CM

## 2022-10-14 DIAGNOSIS — G4719 Other hypersomnia: Secondary | ICD-10-CM

## 2022-10-14 DIAGNOSIS — G4731 Primary central sleep apnea: Secondary | ICD-10-CM

## 2022-10-14 DIAGNOSIS — G479 Sleep disorder, unspecified: Secondary | ICD-10-CM

## 2022-10-14 DIAGNOSIS — Z789 Other specified health status: Secondary | ICD-10-CM

## 2022-10-14 DIAGNOSIS — G4720 Circadian rhythm sleep disorder, unspecified type: Secondary | ICD-10-CM

## 2022-10-15 ENCOUNTER — Telehealth: Payer: Self-pay | Admitting: Neurology

## 2022-10-15 DIAGNOSIS — G4733 Obstructive sleep apnea (adult) (pediatric): Secondary | ICD-10-CM

## 2022-10-15 DIAGNOSIS — G4731 Primary central sleep apnea: Secondary | ICD-10-CM

## 2022-10-15 NOTE — Procedures (Unsigned)
Physician Interpretation:     [facility] PAP TITRATION INTERPRETATION REPORT   STUDY DATE: 10/14/2022      PATIENT NAME:  Mitchell Hughes         DATE OF BIRTH:  Nov 22, 1965  PATIENT ID:  [id number]    TYPE OF STUDY:  CPAP  READING PHYSICIAN: Huston Foley, MD, PhD SCORING TECHNICIAN: Domingo Cocking, RPSGT   Referred by: Jac Canavan, PA-C  ? History and Indication for Testing: 57 year old right-handed gentleman with an underlying medical history of low back pain, kidney stones, olecranon bursitis, arthritis, and status post bilateral hernia repairs, overweight state, sleep apnea on AutoPap therapy, who presents for full night titration study to optimize his sleep apnea treatment. His home sleep test from 01/15/2022 showed severe obstructive sleep apnea with a total AHI of 47.3/hour and O2 nadir of 78%. Intermittent mild to moderate snoring was detected. The study was conducted during the day. He has been on AutoPap therapy since 03/03/2022. He has had some difficulty adjusting to AutoPap therapy. His AHI has been elevated in the moderate range primarily secondary to central events. 95th percentile of pressure has been around 9 cm.  The patient had recent shoulder surgery. His Epworth sleepiness score is 4 out of 24. Height: 77.0 in Weight: 225 lb (BMI 26) Neck Size: 17.0 in.    MEDICATIONS: Neurontin, Hydrocodone and Acetaminophen, Cialis, Trazodone, Keflex, Bactroban, Zofran.  DESCRIPTION: A sleep technologist was in attendance for the duration of the recording.  Data collection, scoring, video monitoring, and reporting were performed in compliance with the AASM Manual for the Scoring of Sleep and Associated Events; (Hypopnea is scored based on the criteria listed in Section VIII D. 1b in the AASM Manual V2.6 using a 4% oxygen desaturation rule or Hypopnea is scored based on the criteria listed in Section VIII D. 1a in the AASM Manual V2.6 using 3% oxygen  desaturation and /or arousal rule).  A physician certified by the American Board of Sleep Medicine reviewed each epoch of the study.   SLEEP CONTINUITY AND SLEEP ARCHITECTURE:  Lights off was at 21:38: and lights on 05:00: (7.4 hours in bed). Total sleep time was 381.0 minutes (100.0% supine;  0.0% lateral;  0.0% prone, 15.0% REM sleep), with a normal sleep efficiency at 86.1%. Sleep latency was decreased at 2.0 minutes.  Of the total sleep time, the percentage of stage N1 sleep was 7.5%, stage N2 sleep was 77.6%, which is markedly increased, stage N3 sleep was absent, and REM sleep was 15.0%, which is mildly reduced. Wake after sleep onset (WASO) time accounted for 59 minutes with intermittent sleep fragmentation noted, ranging from minimal to moderate.    AROUSAL: There were 73 arousals in total, for an arousal index of 11.5 arousals/hour.  Of these, 18 were identified as respiratory-related arousals (2.8 /h), 0 were PLM-related arousals (0.0 /h), and 70 were non-specific arousals (11.0 /h)  RESPIRATORY MONITORING:  Based on CMS criteria (using a 4% oxygen desaturation rule for scoring hypopneas), there were 66 apneas (36 obstructive; 24 central; 6 mixed), and 2 hypopneas.  Apnea index was 10.4. Hypopnea index was 0.3. The apnea-hypopnea index was 10.7 overall (10.7 supine, 0.0 non-supine; 2.1 REM, 2.1 supine REM). There were 0 respiratory effort-related arousals (RERAs).  The RERA index was 0.0 events/h. Total respiratory disturbance index (RDI) was 10.7 events/h. RDI results showed: supine RDI  10.7 /h; non-supine RDI 0.0 /h; REM RDI 2.1 /h, supine REM RDI 2.1 /h.  Based on AASM criteria (using a 3% oxygen desaturation and /or arousal rule for scoring hypopneas), there were 66 apneas (36 obstructive; 24 central; 6 mixed), and 10 hypopneas. Apnea index was 10.4. Hypopnea index was 1.6. The apnea-hypopnea index was 12.0 overall (12.0 supine, 0.0 non-supine; 2.1 REM, 2.1 supine REM). There were 0  respiratory effort-related arousals (RERAs).  The RERA index was 0.0 events/h. Total respiratory disturbance index (RDI) was 12.0 events/h. RDI results showed: supine RDI  12.0 /h; non-supine RDI 0.0 /h; REM RDI 2.1 /h, supine REM RDI 2.1 /h.  Respiratory events were associated with oxyhemoglobin desaturations (nadir during sleep 92%) from a mean of 96%). There were 0 occurrences of Cheyne Stokes breathing.   OXIMETRY: Total sleep time spent at, or below 88% was 0.1 minutes, or 0.0% of total sleep time. Snoring was classified as .   BODY POSITION: Duration of total sleep and percent of total sleep in their respective position is as follows: supine 381 minutes (100.0%), non-supine 0.0 minutes (0.0%); right 00 minutes (0.0%), left 00 minutes (0.0%), and prone 00 minutes (0.0%). Total supine REM sleep time was 57 minutes (100.0% of total REM sleep).   LIMB MOVEMENTS: There were 0 periodic limb movements of sleep (0.0/h), of which 0 (0.0/h) were associated with an arousal.  TITRATION DETAILS (SEE ALSO TABLE AT THE END OF THE REPORT):  The study was conducted using the patient's mask of choice, SW N30i nasal cushion interface.  CPAP was started on 7 cm and gradually increased to 10 cm with EPR of 2.  Some air leakage was noted due to oral venting.  On the final pressure his AHI was elevated at 48.9/h, secondary to obstructive and central events.  He only achieved a total sleep time of 13 minutes on a pressure setting.  He did quite well on a pressure of 8 cm with a total sleep time of 254 minutes, AHI 4/h with supine REM sleep achieved, O2 nadir 94%.   EEG: Review of the EEG showed no abnormal electrical discharges and symmetrical bihemispheric findings.    EKG: The EKG revealed normal sinus rhythm (NSR). The average heart rate during sleep was 62 bpm.  AUDIO/VIDEO REVIEW: The audio and video review did not show any abnormal or unusual behaviors, movements, phonations or vocalizations. The patient took no  restroom breaks. Snoring was not noted to any significant degree while on PAP therapy.    POST-STUDY QUESTIONNAIRE: Post study, the patient indicated, that sleep was the same as usual.   IMPRESSION:  1. Obstructive Sleep Apnea (OSA) 2. Central Sleep Apnea (CSA) 3. Dysfunctions associated with sleep stages or arousal from sleep   RECOMMENDATIONS:  1. This study demonstrates improvement of the patient's sleep disordered breathing including central events with CPAP of 8 cm.  I would recommend home CPAP therapy and switching him from AutoPap therapy.  Narcotic pain medication can cause increase in central apneas.  Sedating medications including and in particular narcotic pain medications should be reduced and avoided as much as possible.   2. The patient should be reminded to be fully compliant with PAP therapy to improve sleep related symptoms and decrease long term cardiovascular risks. The patient should be reminded, that it may take up to 3 months to get fully used to using PAP with all planned sleep. The earlier full compliance is achieved, the better long term compliance tends to be. Please note that untreated obstructive sleep apnea carries additional perioperative morbidity. Patients with significant obstructive sleep apnea  should receive perioperative PAP therapy and the surgeons and particularly the anesthesiologist should be informed of the diagnosis and the severity of the sleep disordered breathing.  3. This study shows sleep fragmentation and abnormal sleep stage percentages; these are nonspecific findings and per se do not signify an intrinsic sleep disorder or a cause for the patient's sleep-related symptoms. Causes include (but are not limited to) the first night effect of the sleep study, circadian rhythm disturbances, medication effect or an underlying mood disorder or medical problem.  4. The patient should be cautioned not to drive, work at heights, or operate dangerous or heavy  equipment when tired or sleepy. Review and reiteration of good sleep hygiene measures should be pursued with any patient. 5. The patient will be seen in follow-up in the sleep clinic at [facility] for discussion of the test results, symptom and treatment compliance review, further management strategies, etc. The referring provider will be notified of the test results.   I certify that I have reviewed the entire raw data recording prior to the issuance of this report in accordance with the Standards of Accreditation of the American Academy of Sleep Medicine (AASM).  Huston Foley, MD, PhD Medical Director, [facility]) Diplomat, ABPN (Neurology and Sleep)               Technical Report:   [facility] CPAP Summary    General Information  Name: Mitchell Hughes, Mitchell Hughes BMI: 26.68 Physician: Huston Foley, MD  ID: [id number] Height: 77.0 in Technician: Domingo Cocking, RPSGT  Sex: Male Weight: 225.0 lb Record: [id number]  Age: 4 [1966-01-11] Date: 10/14/2022     Medical & Medication History    Mitchell Hughes is a 57 year old man with a history of low back pain, kidney stones, olecranon bursitis, arthritis, and overweight state, who reports difficulty maintaining sleep and snoring. His Epworth sleepiness score is 8 out of 24, fatigue severity score is 22 out of 63. He works third shift. He now presents for follow-up consultation of his obstructive sleep apnea after interim testing and starting AutoPap therapy. He had a home sleep test on 01/15/2022 which showed severe obstructive sleep apnea with a total AHI of 47.3/hour and O2 nadir of 78%. Intermittent mild to moderate snoring was detected. The study was conducted during the day. The patient is a third shift worker, total sleep time was slightly less than 4-1/2 hours. He was advised to proceed with home AutoPap therapy. His set up date was 03/03/2022. He has a ResMed air sense 10  AutoSet machine. His DME company is [employer]. AutoPap compliance data from 03/30/2022 through 04/28/2022, which is a total of 30 days, during which time he used his machine 25 days with percent use days greater than 4 hours at 63%, indicating suboptimal compliance with an average usage of 5 hours and 18 minutes for days on treatment, residual AHI elevated at 19.2/h, secondary to primarily central events, 95th percentile of pressure at 9.3 cm with a range of 7 to 14 cm with EPR of 3.  Neurontin, Hydrocodone and Acetaminophen, Cialis, Trazodone, Keflex, Bactroban, Zofran   Sleep Disorder      Comments   Patient arrived for a diagnostic polysomnogram. Patient is a current compliant Auto PAP user and his data downloads are showing an increased central index as well as some moderate leaks. Patient here for a fine tune. Procedure explained and all questions answered. Patient is one week S/P rotator cuff surgery on right  shoulder. Standard paste setup without complications. Patient slept supine. No significant snoring was heard. Few respiratory events observed, primarily in REM sleep. CPAP was started at 7 cm using patient mask, a small wide N30i nasal cushion mask with heated humidity. CPAP was increased to 10 cm in an effort to control obstructive respiratory events and abolish snoring. EPR at 2 was tried for comparison. Intermittent oral venting caused leaks. No obvious cardiac arrhythmias noted. No significant PLMS observed. Patient had no restroom visit.    CPAP start time: 09:38:50 PM CPAP end time: 05:00:57 AM   Time Total Supine Side Prone Upright  Recording (TRT) 7h 22.87m 7h 22.65m 0h 0.23m 0h 0.73m 0h 0.37m  Sleep (TST) 6h 21.23m 6h 21.73m 0h 0.43m 0h 0.65m 0h 0.58m   Latency N1 N2 N3 REM Onset Per. Slp. Eff.  Actual 0h 0.38m 0h 2.86m 0h 0.66m 0h 57.52m 0h 2.48m 0h 2.14m 86.10%   Stg Dur Wake N1 N2 N3 REM  Total 61.5 28.5 295.5 0.0 57.0  Supine 61.5 28.5 295.5 0.0 57.0  Side 0.0 0.0 0.0 0.0 0.0  Prone 0.0  0.0 0.0 0.0 0.0  Upright 0.0 0.0 0.0 0.0 0.0   Stg % Wake N1 N2 N3 REM  Total 13.9 7.5 77.6 0.0 15.0  Supine 13.9 7.5 77.6 0.0 15.0  Side 0.0 0.0 0.0 0.0 0.0  Prone 0.0 0.0 0.0 0.0 0.0  Upright 0.0 0.0 0.0 0.0 0.0     Apnea Summary Sub Supine Side Prone Upright  Total 66 Total 66 66 0 0 0    REM 1 1 0 0 0    NREM 65 65 0 0 0  Obs 36 REM 0 0 0 0 0    NREM 36 36 0 0 0  Mix 6 REM 1 1 0 0 0    NREM 5 5 0 0 0  Cen 24 REM 0 0 0 0 0    NREM 24 24 0 0 0   Rera Summary Sub Supine Side Prone Upright  Total 0 Total 0 0 0 0 0    REM 0 0 0 0 0    NREM 0 0 0 0 0   Hypopnea Summary Sub Supine Side Prone Upright  Total 10 Total 10 10 0 0 0    REM 1 1 0 0 0    NREM 9 9 0 0 0   4% Hypopnea Summary Sub Supine Side Prone Upright  Total (4%) 2 Total 2 2 0 0 0    REM 1 1 0 0 0    NREM 1 1 0 0 0     AHI Total Obs Mix Cen  11.97 Apnea 10.39 5.67 0.94 3.78   Hypopnea 1.57 -- -- --  10.71 Hypopnea (4%) 0.31 -- -- --    Total Supine Side Prone Upright  Position AHI 11.97 11.97 0.00 0.00 0.00  REM AHI 2.11   NREM AHI 13.70   Position RDI 11.97 11.97 0.00 0.00 0.00  REM RDI 2.11   NREM RDI 13.70    4% Hypopnea Total Supine Side Prone Upright  Position AHI (4%) 10.71 10.71 0.00 0.00 0.00  REM AHI (4%) 2.11   NREM AHI (4%) 12.22   Position RDI (4%) 10.71 10.71 0.00 0.00 0.00  REM RDI (4%) 2.11   NREM RDI (4%) 12.22    Desaturation Information  <100% <90% <80% <70% <60% <50% <40%  Supine 57 0 0 0 0 0 0  Side 0 0 0  0 0 0 0  Prone 0 0 0 0 0 0 0  Upright 0 0 0 0 0 0 0  Total 57 0 0 0 0 0 0  Desaturation threshold setting: 3% Minimum desaturation setting: 10 seconds SaO2 nadir: 92% The longest event was a 20 sec obstructive Apnea with a minimum SaO2 of 95%. The lowest SaO2 was 92% associated with a 18 sec obstructive Hypopnea. EKG Rates EKG Avg Max Min  Awake 62 79 55  Asleep 62 80 54  EKG Events: N/A Awakening/Arousal Information # of Awakenings 24  Wake after sleep onset  59.60m  Wake after persistent sleep 59.4m   Arousal Assoc. Arousals Index  Apneas 15 2.4  Hypopneas 3 0.5  Leg Movements 0 0.0  Snore 0.0 0.0  PTT Arousals 0 0.0  Spontaneous 70 11.0  Total 88 13.9  Myoclonus Information PLMS LMs Index  Total LMs during PLMS 0 0.0  LMs w/ Microarousals 0 0.0   LM LMs Index  w/ Microarousal 0 0.0  w/ Awakening 0 0.0  w/ Resp Event 0 0.0  Spontaneous 5 0.8  Total 5 0.8      Titration Table:  [facility] CPAP/Bilevel Report    General Information  Name: Arjan, Stanton BMI: 26 Physician: Huston Foley, MD  ID: [id number] Height: 77 in Technician: Domingo Cocking  Sex: Male Weight: 225 lb Record: [id number]  Age: 44 [[birth year]] Date: 10/14/2022 Scorer: Domingo Cocking   Recommended Settings IPAP: N/A cmH20 EPAP: N/A cmH2O AHI: N/A AHI (4%): N/A   Pressure IPAP/EPAP 00 07 08 09 10   O2 Vol 0.0 0.0 0.0 0.0 0.0  Time TRT 0.17m 23.69m 278.53m 127.51m 14.50m   TST 0.27m 21.72m 252.89m 94.45m 13.63m  Sleep Stage % Wake 0.0 8.7 9.4 25.9 3.6   % REM 0.0 0.0 17.9 12.7 0.0   % N1 0.0 14.3 3.8 14.8 14.8   % N2 0.0 85.7 78.4 72.5 85.2   % N3 0.0 0.0 0.0 0.0 0.0  Respiratory Total Events 0 1 17 47 11   Obs. Apn. 0 0 8 19 9    Mixed Apn. 0 0 3 3 0   Cen. Apn. 0 1 1 20 2    Hypopneas 0 0 5 5 0   AHI 0.00 2.86 4.05 29.84 48.89   Supine AHI 0.00 2.86 4.05 29.84 48.89   Prone AHI 0.00 0.00 0.00 0.00 0.00   Side AHI 0.00 0.00 0.00 0.00 0.00  Respiratory (4%) Hypopneas (4%) 0.00 0.00 1.00 1.00 0.00   AHI (4%) 0.00 2.86 3.10 27.30 48.89   Supine AHI (4%) 0.00 2.86 3.10 27.30 48.89   Prone AHI (4%) 0.00 0.00 0.00 0.00 0.00   Side AHI (4%) 0.00 0.00 0.00 0.00 0.00  Desat Profile <= 90% 0.87m 0.21m 0.42m 0.38m 0.77m   <= 80% 0.72m 0.85m 0.76m 0.37m 0.84m   <= 70% 0.24m 0.42m 0.93m 0.82m 0.25m   <= 60% 0.38m 0.22m 0.28m 0.72m 0.50m  Arousal Index Apnea 0.0 0.0 1.2 3.8 17.8   Hypopnea 0.0 0.0 0.7 0.0 0.0   LM 0.0 0.0 0.0 0.0 0.0    Spontaneous 0.0 17.1 10.7 12.1 0.0

## 2022-10-15 NOTE — Telephone Encounter (Signed)
Patient had a titration study on 10/14/22.  I changed his autoPAP to CPAP of 9 cm in Jan.  Based on the CPAP titration study I recommend CPAP of 8 cm with EPR of 2.  Can you pull a DL from the last 90 days while he was on 9 cm? Please send order to DME and have him schedule a FU in 3 mo. May schedule with NP in a VV too.

## 2022-10-16 ENCOUNTER — Ambulatory Visit (INDEPENDENT_AMBULATORY_CARE_PROVIDER_SITE_OTHER): Payer: BC Managed Care – PPO | Admitting: Physician Assistant

## 2022-10-16 DIAGNOSIS — Z9889 Other specified postprocedural states: Secondary | ICD-10-CM

## 2022-10-16 NOTE — Progress Notes (Signed)
Post-Op Visit Note   Patient: Mitchell Hughes           Date of Birth: 1965-11-27           MRN: [id number] Visit Date: 10/16/2022 PCP: Jac Canavan, PA-C   Assessment & Plan:  Chief Complaint:  Chief Complaint  Patient presents with   Right Shoulder - Routine Post Op   Visit Diagnoses:  1. S/P arthroscopy of right shoulder     Plan: Patient is a pleasant 57 year old gentleman who comes in today 1 week status post right shoulder arthroscopic debridement, decompression and rotator cuff repair 10/09/2022.  He has been doing well.  He has been compliant in a sling.  He has been taking Percocet and Robaxin for pain.  Examination of the right shoulder reveals fully healed surgical incisions without complication.  He is neurovascular intact distally.  Today, the portal sutures were removed and Steri-Strips applied.  The rotator cuff repair incision was cleaned and covered with Tegaderm.  He will follow-up next week for suture removal.  He will continue wearing his sling for another 5 weeks.  Referral to outpatient physical therapy has been made.  Call with concerns or questions.  Follow-Up Instructions: Return in about 5 weeks (around 11/20/2022).   Orders:  Orders Placed This Encounter  Procedures   Ambulatory referral to Physical Therapy   No orders of the defined types were placed in this encounter.   Imaging: No new imaging  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   Benign prostatic hyperplasia with weak urinary stream 02/24/2022   Urinary hesitancy 02/24/2022   COVID-19 vaccine administered 02/24/2022   Screening for lipid disorders 02/24/2022   History of renal cell cancer 02/24/2022   History of renal stone 02/24/2022   Umbilical hernia without obstruction and without gangrene  02/24/2022   Tendinitis of left triceps 01/21/2022   Renal stone 05/17/2021   Sepsis with acute organ dysfunction (HCC) 04/19/2021   Kidney stone 04/19/2021   Renal mass 04/19/2021   Splenomegaly 04/19/2021   Sepsis (HCC) 04/14/2021   Acute lower UTI 04/14/2021   Bilateral hand numbness 02/14/2021   Localized swelling on right hand 02/14/2021   Sleep disturbance 02/14/2021   Snoring 02/14/2021   Erectile dysfunction 02/14/2021   Need for influenza vaccination 02/14/2021   Impingement syndrome of left shoulder 06/20/2020   Primary osteoarthritis of first carpometacarpal joint of left hand 06/20/2020   Encounter for health maintenance examination in adult 02/17/2020   Screening for prostate cancer 02/17/2020   History of COVID-19 02/17/2020   Toenail deformity 02/17/2020   Neuropathy of foot, right 02/17/2020   Chronic pain of both shoulders 04/22/2019   Decreased range of motion of shoulder 04/22/2019   Degenerative arthritis of thumb, left 04/22/2019   Right inguinal hernia 01/28/2019   Aortic valve calcification 01/28/2019   Vaccine counseling 06/22/2017   Family history of heart disease 12/21/2013   Family history of colon cancer 12/21/2013   Nephrolithiasis 09/28/2009   Past Medical History:  Diagnosis Date   Arthritis    Erectile dysfunction    Family history of colon cancer    sister in 72s   Family history of heart disease 2011   baseline cardiac eval 2011 with Dr. Royann Shivers   H/O echocardiogram 2015   Dr. Donnie Aho  H/O echocardiogram 02/2019   History of exercise stress test    04/2014 Dr. Donnie Aho, prior 2011 normal Bruce treadmill stress test, Dr. Rennis Golden   History of kidney stones    LOW BACK PAIN, ACUTE 09/26/2009   OSA (obstructive sleep apnea) 01/2022   has cpap   TOBACCO USE, QUIT 09/26/2009   Wears glasses     Family History  Problem Relation Age of Onset   Thyroid disease Mother    Other Mother        brain disease, possibly dementia?   Gallstones  Mother    Other Father        spinal cord injury/accident   Heart disease Father 81   Heart disease Sister 30       artificial valve   Colon cancer Sister    Sleep apnea Sister    Cancer Sister 39       colon   Sleep apnea Sister    Cancer Sister        pancreas   Pancreatic cancer Sister    Heart disease Sister    Heart disease Brother 47       MI   Diabetes Brother    Pancreatic cancer Brother    Heart disease Brother 48       pacemaker   Cancer Brother        liver, formerly pancreas   Heart disease Other        parent, other relative   Colon cancer Other    Stroke Neg Hx    Hypertension Neg Hx    Rectal cancer Neg Hx    Stomach cancer Neg Hx     Past Surgical History:  Procedure Laterality Date   COLONOSCOPY  08/2017   tubular adenoma, repeat 5 years; Dr. Ileene Patrick   FOOT SURGERY     bone spur, right; Dr. Rolland Bimler HERNIA REPAIR Bilateral 04/11/2022   Procedure: LAPAROSCOPIC BILATERAL INGUINAL HERNIA REPAIR with MESH;  Surgeon: Karie Soda, MD;  Location: [facility];  Service: General;  Laterality: Bilateral;  LOCAL   IR NEPHROSTOMY PLACEMENT RIGHT  04/14/2021   NEPHROLITHOTOMY Right 05/17/2021   Procedure: NEPHROLITHOTOMY PERCUTANEOUS insertion double j stent;  Surgeon: Sebastian Ache, MD;  Location: [facility];  Service: Urology;  Laterality: Right;   ROBOT ASSISTED LAPAROSCOPIC NEPHRECTOMY Left 07/26/2021   Procedure: XI ROBOTIC ASSISTED LAPAROSCOPIC NEPHRECTOMY;  Surgeon: Sebastian Ache, MD;  Location: [facility];  Service: Urology;  Laterality: Left;  3 HRS   UMBILICAL HERNIA REPAIR N/A 04/11/2022   Procedure: HERNIA REPAIR UMBILICAL ADULT;  Surgeon: Karie Soda, MD;  Location: [facility];  Service: General;  Laterality: N/A;  PRIMARY UMBILICAL HERNIA REPAIR   WISDOM TOOTH EXTRACTION     WISDOM TOOTH EXTRACTION     Social History   Occupational History   Not on file  Tobacco Use   Smoking status: Former    Packs/day: 0.25    Years: 30.00     Additional pack years: 0.00    Total pack years: 7.50    Types: Cigarettes    Quit date: 03/05/2009    Years since quitting: 13.6   Smokeless tobacco: Never   Tobacco comments:    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

## 2022-10-16 NOTE — Telephone Encounter (Signed)
I called pt, relayed message , cpap to 8cm.  [facility] messaged.  Made appt with SS/NP 01-15-2023 at 1245.  Pt verbalized understanding of plan.

## 2022-10-16 NOTE — Telephone Encounter (Signed)
Zott, Hennie Duos, RN; Melvern Sample Got It       Previous Messages    ----- Message ----- From: Guy Begin, RN Sent: 10/16/2022   3:12 PM EDT To: Melvern Sample; Kennyth Arnold Zott Subject: change from auto to cpap                      Mitchell Hughes Male, 57 y.o., December 04, 1965 MRN: [id number] Phone: [phone]   New order in EPIC  Thank you  Andrey Campanile RN

## 2022-10-23 NOTE — Therapy (Signed)
OUTPATIENT PHYSICAL THERAPY SHOULDER EVALUATION   Patient Name: Mitchell Hughes MRN: [id number] DOB:02/16/1966, 57 y.o., male Today's Date: 10/24/2022  END OF SESSION:  PT End of Session - 10/24/22 1210     Visit Number 1    Number of Visits 18    Date for PT Re-Evaluation 01/02/23    Authorization - Visit Number 1    Authorization - Number of Visits 60    Progress Note Due on Visit 18    PT Start Time 0941    PT Stop Time 1019    PT Time Calculation (min) 38 min    Activity Tolerance Patient tolerated treatment well;No increased pain;Patient limited by pain    Behavior During Therapy [facility] for tasks assessed/performed             Past Medical History:  Diagnosis Date   Arthritis    Erectile dysfunction    Family history of colon cancer    sister in 26s   Family history of heart disease 2011   baseline cardiac eval 2011 with Dr. Royann Shivers   H/O echocardiogram 2015   Dr. Donnie Aho   H/O echocardiogram 02/2019   History of exercise stress test    04/2014 Dr. Donnie Aho, prior 2011 normal Bruce treadmill stress test, Dr. Rennis Golden   History of kidney stones    LOW BACK PAIN, ACUTE 09/26/2009   OSA (obstructive sleep apnea) 01/2022   has cpap   TOBACCO USE, QUIT 09/26/2009   Wears glasses    Past Surgical History:  Procedure Laterality Date   COLONOSCOPY  08/2017   tubular adenoma, repeat 5 years; Dr. Ileene Patrick   FOOT SURGERY     bone spur, right; Dr. Rolland Bimler HERNIA REPAIR Bilateral 04/11/2022   Procedure: LAPAROSCOPIC BILATERAL INGUINAL HERNIA REPAIR with MESH;  Surgeon: Karie Soda, MD;  Location: [facility];  Service: General;  Laterality: Bilateral;  LOCAL   IR NEPHROSTOMY PLACEMENT RIGHT  04/14/2021   NEPHROLITHOTOMY Right 05/17/2021   Procedure: NEPHROLITHOTOMY PERCUTANEOUS insertion double j stent;  Surgeon: Sebastian Ache, MD;  Location: [facility];  Service: Urology;  Laterality: Right;   ROBOT ASSISTED LAPAROSCOPIC NEPHRECTOMY Left 07/26/2021   Procedure: XI  ROBOTIC ASSISTED LAPAROSCOPIC NEPHRECTOMY;  Surgeon: Sebastian Ache, MD;  Location: [facility];  Service: Urology;  Laterality: Left;  3 HRS   UMBILICAL HERNIA REPAIR N/A 04/11/2022   Procedure: HERNIA REPAIR UMBILICAL ADULT;  Surgeon: Karie Soda, MD;  Location: [facility];  Service: General;  Laterality: N/A;  PRIMARY UMBILICAL HERNIA REPAIR   WISDOM TOOTH EXTRACTION     WISDOM TOOTH EXTRACTION     Patient Active Problem List   Diagnosis Date Noted   Nontraumatic complete tear of right rotator cuff 07/22/2022   Impingement syndrome of right shoulder 07/22/2022   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   Benign prostatic hyperplasia with weak urinary stream 02/24/2022   Urinary hesitancy 02/24/2022   COVID-19 vaccine administered 02/24/2022   Screening for lipid disorders 02/24/2022   History of renal cell cancer 02/24/2022   History of renal stone 02/24/2022   Umbilical hernia without obstruction and without gangrene 02/24/2022   Tendinitis of left triceps 01/21/2022   Renal stone 05/17/2021   Sepsis with acute organ dysfunction (HCC) 04/19/2021   Kidney stone 04/19/2021   Renal mass 04/19/2021   Splenomegaly 04/19/2021   Sepsis (HCC) 04/14/2021   Acute lower UTI 04/14/2021   Bilateral hand numbness 02/14/2021   Localized swelling on right hand 02/14/2021   Sleep  disturbance 02/14/2021   Snoring 02/14/2021   Erectile dysfunction 02/14/2021   Need for influenza vaccination 02/14/2021   Impingement syndrome of left shoulder 06/20/2020   Primary osteoarthritis of first carpometacarpal joint of left hand 06/20/2020   Encounter for health maintenance examination in adult 02/17/2020   Screening for prostate cancer 02/17/2020   History of COVID-19 02/17/2020   Toenail deformity 02/17/2020   Neuropathy of foot, right 02/17/2020   Chronic pain of both shoulders 04/22/2019   Decreased range of motion of shoulder 04/22/2019   Degenerative arthritis of thumb, left 04/22/2019   Right  inguinal hernia 01/28/2019   Aortic valve calcification 01/28/2019   Vaccine counseling 06/22/2017   Family history of heart disease 12/21/2013   Family history of colon cancer 12/21/2013   Nephrolithiasis 09/28/2009    PCP: Jac Canavan, PA-C  REFERRING PROVIDER: Cristie Hem, PA-C  REFERRING DIAG:  Diagnosis  [phone] (ICD-10-CM) - S/P arthroscopy of right shoulder  PT eval and treat S/p right shoulder mini open rotator cuff repair  THERAPY DIAG:  Acute pain of right shoulder - Plan: PT plan of care cert/re-cert  Stiffness of right shoulder, not elsewhere classified - Plan: PT plan of care cert/re-cert  Localized edema - Plan: PT plan of care cert/re-cert  Muscle weakness (generalized) - Plan: PT plan of care cert/re-cert  Rationale for Evaluation and Treatment: Rehabilitation  ONSET DATE: October 09, 2022 surgery  SUBJECTIVE:                                                                                                                                                                                      SUBJECTIVE STATEMENT: Jeriah had his right rotator cuff repaired 2 weeks ago yesterday.  He is currently getting 1-2 hours of uninterrupted sleep.  He has done no physical therapy post-surgery until today.  Hand dominance: Right  PERTINENT HISTORY: OA left 1st CMC joint, right foot surgery, significant kidney history  PAIN:  Are you having pain? Yes: NPRS scale: 6-10/10 Pain location: Global right shoulder Pain description: Achy with occasional sharp, stabbing Aggravating factors: Most right shoulder use Relieving factors: Oxycodone day and night  PRECAUTIONS: Other: Rotator cuff repair 10/09/2022  WEIGHT BEARING RESTRICTIONS: Yes None on right upper extremity  FALLS:  Has patient fallen in last 6 months? 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 Moderate distal infraspinatus tendinosis with articular and bursal sided fraying. Mild distal subscapularis tendinosis. No significant muscle atrophy.   Mild intra-articular long head biceps tendinosis.   Mild glenohumeral and AC joint osteoarthritis.  PATIENT SURVEYS:  FOTO 32 (Goal 68 in 18 visits)  COGNITION: Overall cognitive status: Within functional limits for tasks assessed     SENSATION: Augusto has no complaints of peripheral pain or paresthesias related to his right shoulder surgery  POSTURE: Mild forward head, internally rotated and protracted shoulders  UPPER EXTREMITY ROM:   Passive ROM Left/Right 10/24/2022   Shoulder flexion 135/90   Shoulder extension    Shoulder abduction    Shoulder adduction    Shoulder internal rotation 70/35   Shoulder external rotation 85/10   Elbow flexion    Elbow extension    Wrist flexion    Wrist extension    Wrist ulnar deviation    Wrist radial deviation    Wrist pronation    Wrist supination    (Blank rows = not tested)  UPPER EXTREMITY Strength:  MMT Left/Right 10/24/2022   Shoulder flexion    Shoulder extension    Shoulder abduction    Shoulder adduction    Shoulder internal rotation Deferred   Shoulder external rotation Deferred   Middle trapezius    Lower trapezius    Elbow flexion    Elbow extension    Wrist flexion    Wrist extension    Wrist ulnar deviation    Wrist radial deviation    Wrist pronation    Wrist supination    Grip strength (lbs)    (Blank rows = not tested) Strength testing was deferred today secondary to being 15 days status post right rotator cuff  repair   TODAY'S TREATMENT:                                                                                                                                         DATE: 10/24/2022 Codman's pendulums: Forward and back; clockwise and counterclockwise 20 times each Shoulder blade pinches 5 x 5 seconds  PATIENT EDUCATION: Education details: Reviewed exam findings, home exercises and discussed the expected course and duration of supervised physical therapy Person educated: Patient Education method: Explanation, Demonstration, Tactile cues, Verbal cues, and Handouts Education comprehension: verbalized understanding, returned demonstration, verbal cues required, tactile cues required, and needs further education  HOME EXERCISE PROGRAM: [id number]  ASSESSMENT:  CLINICAL IMPRESSION: Patient is a 57 y.o. male who was seen today for physical therapy evaluation and treatment for  Diagnosis  Z98.890 (ICD-10-CM) - S/P arthroscopy of right shoulder  S/p right shoulder mini open rotator cuff repair.  As would be expected at this point postsurgery, Finnean is only getting 1 to 2 hours of uninterrupted sleep.  He was a bit guarded during passive range of motion  assessment today and passive range of motion was not aggressively pursued.  We started him on a phase 1 postsurgical protocol with early emphasis on improving capsular flexibility and passive range of motion.  Rehabilitation will shift to more active range of motion 4-6 weeks postsurgery with addition of appropriate scapular strengthening 6 to 8 weeks postsurgery.  I generally like to defer more isolated rotator cuff strengthening until least 10 weeks post-surgery to allow adequate healing and I expect Derry will be ready for transfer into independent rehabilitation week 12 post-surgery.  OBJECTIVE IMPAIRMENTS: decreased activity tolerance, decreased endurance, decreased knowledge of condition, decreased ROM, decreased strength, decreased safety awareness,  increased edema, impaired perceived functional ability, increased muscle spasms, impaired UE functional use, postural dysfunction, and pain.   ACTIVITY LIMITATIONS: carrying, lifting, sleeping, toileting, dressing, reach over head, and hygiene/grooming  PARTICIPATION LIMITATIONS: meal prep, cleaning, laundry, driving, shopping, community activity, and occupation  PERSONAL FACTORS: OA left 1st CMC joint, right foot surgery, significant kidney history are also affecting patient's functional outcome.   REHAB POTENTIAL: Good  CLINICAL DECISION MAKING: Stable/uncomplicated  EVALUATION COMPLEXITY: Low   GOALS: Goals reviewed with patient? Yes  SHORT TERM GOALS: Target date: 11/21/2022  Jayke will be independent with his day 1 HEP Baseline: Started 10/24/2022 Goal status: INITIAL  2.  Improve right shoulder passive range of motion for flexion to 150 degrees; internal rotation 50 degrees and external rotation to 70 degrees Baseline: 90; 35 and 10 respectively Goal status: INITIAL  3.  Laterrance will be able to sleep at least 4 hours uninterrupted Baseline: 1 to 2 hours Goal status: INITIAL   LONG TERM GOALS: Target date: 01/02/2023  Improve FOTO to 68 in 18 visits Baseline: 32 Goal status: INITIAL  2.  Improve right should pain to consistently 0-3/10 on the VAS. Baseline: 6-10 out of 10 Goal status: INITIAL  3.  Improve right shoulder AROM for flexion to 170 degrees; ER to 90 degrees; IR to 60 degrees and horizontal adduction to 40 degrees Baseline: See above Goal status: INITIAL  4.  Improve right shoulder strength for ER to at least 15 pounds and IR to at least 30 pounds.  60% strength right vs left is expected 12 weeks post-surgery with 90% or better strength 9-12 months post-surgery. Baseline: Deferred secondary to surgery 15 days ago at evaluation Goal status: INITIAL  5.  Nuchem will be independent with his long-term maintenance HEP at DC Baseline: Started 10/24/2022 Goal  status: INITIAL   PLAN:  PT FREQUENCY: 1-2x/week  PT DURATION: 10 weeks  PLANNED INTERVENTIONS: Therapeutic exercises, Therapeutic activity, Neuromuscular re-education, Patient/Family education, Self Care, Joint mobilization, Cryotherapy, Vasopneumatic device, and Manual therapy  PLAN FOR NEXT SESSION: Focus on passive range of motion through July 4.  Start AAROM and AROM July 5.  Consider progressing resistance on scapular strengthening July 19 and consider adding resistance to rotator cuff strengthening beginning August 9th.   Cherlyn Cushing, PT, MPT 10/24/2022, 12:29 PM

## 2022-10-24 ENCOUNTER — Ambulatory Visit (INDEPENDENT_AMBULATORY_CARE_PROVIDER_SITE_OTHER): Payer: BC Managed Care – PPO | Admitting: Rehabilitative and Restorative Service Providers"

## 2022-10-24 ENCOUNTER — Ambulatory Visit: Payer: BC Managed Care – PPO

## 2022-10-24 ENCOUNTER — Encounter: Payer: Self-pay | Admitting: Rehabilitative and Restorative Service Providers"

## 2022-10-24 ENCOUNTER — Telehealth: Payer: Self-pay

## 2022-10-24 DIAGNOSIS — M25611 Stiffness of right shoulder, not elsewhere classified: Secondary | ICD-10-CM | POA: Diagnosis not present

## 2022-10-24 DIAGNOSIS — R600 Localized edema: Secondary | ICD-10-CM

## 2022-10-24 DIAGNOSIS — M25511 Pain in right shoulder: Secondary | ICD-10-CM

## 2022-10-24 DIAGNOSIS — M6281 Muscle weakness (generalized): Secondary | ICD-10-CM | POA: Diagnosis not present

## 2022-10-24 DIAGNOSIS — Z9889 Other specified postprocedural states: Secondary | ICD-10-CM

## 2022-10-24 MED ORDER — OXYCODONE-ACETAMINOPHEN 5-325 MG PO TABS: 1.0000 | ORAL_TABLET | Freq: Four times a day (QID) | ORAL | 0 refills | Status: DC | PRN

## 2022-10-24 NOTE — Addendum Note (Signed)
Addended by: Mayra Reel on: 10/24/2022 11:14 AM   Modules accepted: Orders

## 2022-10-24 NOTE — Telephone Encounter (Signed)
done

## 2022-10-24 NOTE — Telephone Encounter (Signed)
Patient came in for an nurse only visit for stitch removal right shoulder. He states that he is out of pain medication and is asking for a refill of his Oxycodone to be sent to [facility] in [place]. Please call to advise.

## 2022-10-24 NOTE — Progress Notes (Signed)
Pt is s/p a right shoulder arthroscopic debridement, decompression and RTC repair. He is here for a nurse only visit fir suture removal.  The incision is well approximated and there is no redness or drainage on the bandage. Sutures were removed without incident  and a dry dressing was applied to the incision. The pt did ask for a refill of his Oxycodone 5/325 as he is out of this medication. I advised that I would send a message to Dr. Roda Shutters to address this.  Andron Marrazzo, RMA,CWCA

## 2022-10-29 ENCOUNTER — Encounter: Payer: Self-pay | Admitting: Physical Therapy

## 2022-10-29 ENCOUNTER — Ambulatory Visit (INDEPENDENT_AMBULATORY_CARE_PROVIDER_SITE_OTHER): Payer: BC Managed Care – PPO | Admitting: Physical Therapy

## 2022-10-29 ENCOUNTER — Other Ambulatory Visit: Payer: Self-pay | Admitting: Physician Assistant

## 2022-10-29 ENCOUNTER — Telehealth: Payer: Self-pay | Admitting: Orthopaedic Surgery

## 2022-10-29 DIAGNOSIS — M25511 Pain in right shoulder: Secondary | ICD-10-CM | POA: Diagnosis not present

## 2022-10-29 DIAGNOSIS — R600 Localized edema: Secondary | ICD-10-CM | POA: Diagnosis not present

## 2022-10-29 DIAGNOSIS — M25611 Stiffness of right shoulder, not elsewhere classified: Secondary | ICD-10-CM

## 2022-10-29 DIAGNOSIS — M6281 Muscle weakness (generalized): Secondary | ICD-10-CM

## 2022-10-29 MED ORDER — OXYCODONE-ACETAMINOPHEN 5-325 MG PO TABS: 1.0000 | ORAL_TABLET | Freq: Four times a day (QID) | ORAL | 0 refills | Status: AC | PRN

## 2022-10-29 NOTE — Telephone Encounter (Signed)
I sent in but may be too early to pick up  based on last rx

## 2022-10-29 NOTE — Telephone Encounter (Signed)
Pt came into office stated Dr Roda Shutters was to send oxycodone to [facility] on [address] [place] please send oxycodone to [facility]. Pt phone number is [phone].

## 2022-10-29 NOTE — Telephone Encounter (Signed)
Notified patient.

## 2022-10-29 NOTE — Therapy (Signed)
OUTPATIENT PHYSICAL THERAPY SHOULDER TREATMENT   Patient Name: Mitchell Hughes MRN: [id number] DOB:1965-09-10, 57 y.o., male Today's Date: 10/29/2022  END OF SESSION:  PT End of Session - 10/29/22 1059     Visit Number 2    Number of Visits 18    Date for PT Re-Evaluation 01/02/23    Authorization - Visit Number 2    Authorization - Number of Visits 60    Progress Note Due on Visit 18    PT Start Time 1059    PT Stop Time 1137    PT Time Calculation (min) 38 min    Activity Tolerance Patient tolerated treatment well    Behavior During Therapy [facility] for tasks assessed/performed             Past Medical History:  Diagnosis Date   Arthritis    Erectile dysfunction    Family history of colon cancer    sister in 32s   Family history of heart disease 2011   baseline cardiac eval 2011 with Dr. Royann Shivers   H/O echocardiogram 2015   Dr. Donnie Aho   H/O echocardiogram 02/2019   History of exercise stress test    04/2014 Dr. Donnie Aho, prior 2011 normal Bruce treadmill stress test, Dr. Rennis Golden   History of kidney stones    LOW BACK PAIN, ACUTE 09/26/2009   OSA (obstructive sleep apnea) 01/2022   has cpap   TOBACCO USE, QUIT 09/26/2009   Wears glasses    Past Surgical History:  Procedure Laterality Date   COLONOSCOPY  08/2017   tubular adenoma, repeat 5 years; Dr. Ileene Patrick   FOOT SURGERY     bone spur, right; Dr. Rolland Bimler HERNIA REPAIR Bilateral 04/11/2022   Procedure: LAPAROSCOPIC BILATERAL INGUINAL HERNIA REPAIR with MESH;  Surgeon: Karie Soda, MD;  Location: [facility];  Service: General;  Laterality: Bilateral;  LOCAL   IR NEPHROSTOMY PLACEMENT RIGHT  04/14/2021   NEPHROLITHOTOMY Right 05/17/2021   Procedure: NEPHROLITHOTOMY PERCUTANEOUS insertion double j stent;  Surgeon: Sebastian Ache, MD;  Location: [facility];  Service: Urology;  Laterality: Right;   ROBOT ASSISTED LAPAROSCOPIC NEPHRECTOMY Left 07/26/2021   Procedure: XI ROBOTIC ASSISTED LAPAROSCOPIC NEPHRECTOMY;   Surgeon: Sebastian Ache, MD;  Location: [facility];  Service: Urology;  Laterality: Left;  3 HRS   UMBILICAL HERNIA REPAIR N/A 04/11/2022   Procedure: HERNIA REPAIR UMBILICAL ADULT;  Surgeon: Karie Soda, MD;  Location: [facility];  Service: General;  Laterality: N/A;  PRIMARY UMBILICAL HERNIA REPAIR   WISDOM TOOTH EXTRACTION     WISDOM TOOTH EXTRACTION     Patient Active Problem List   Diagnosis Date Noted   Nontraumatic complete tear of right rotator cuff 07/22/2022   Impingement syndrome of right shoulder 07/22/2022   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   Benign prostatic hyperplasia with weak urinary stream 02/24/2022   Urinary hesitancy 02/24/2022   COVID-19 vaccine administered 02/24/2022   Screening for lipid disorders 02/24/2022   History of renal cell cancer 02/24/2022   History of renal stone 02/24/2022   Umbilical hernia without obstruction and without gangrene 02/24/2022   Tendinitis of left triceps 01/21/2022   Renal stone 05/17/2021   Sepsis with acute organ dysfunction (HCC) 04/19/2021   Kidney stone 04/19/2021   Renal mass 04/19/2021   Splenomegaly 04/19/2021   Sepsis (HCC) 04/14/2021   Acute lower UTI 04/14/2021   Bilateral hand numbness 02/14/2021   Localized swelling on right hand 02/14/2021   Sleep disturbance 02/14/2021   Snoring  02/14/2021   Erectile dysfunction 02/14/2021   Need for influenza vaccination 02/14/2021   Impingement syndrome of left shoulder 06/20/2020   Primary osteoarthritis of first carpometacarpal joint of left hand 06/20/2020   Encounter for health maintenance examination in adult 02/17/2020   Screening for prostate cancer 02/17/2020   History of COVID-19 02/17/2020   Toenail deformity 02/17/2020   Neuropathy of foot, right 02/17/2020   Chronic pain of both shoulders 04/22/2019   Decreased range of motion of shoulder 04/22/2019   Degenerative arthritis of thumb, left 04/22/2019   Right inguinal hernia 01/28/2019   Aortic valve  calcification 01/28/2019   Vaccine counseling 06/22/2017   Family history of heart disease 12/21/2013   Family history of colon cancer 12/21/2013   Nephrolithiasis 09/28/2009    PCP: Jac Canavan, PA-C  REFERRING PROVIDER: Cristie Hem, PA-C  REFERRING DIAG:  Diagnosis  [phone] (ICD-10-CM) - S/P arthroscopy of right shoulder  PT eval and treat S/p right shoulder mini open rotator cuff repair  THERAPY DIAG:  Acute pain of right shoulder  Stiffness of right shoulder, not elsewhere classified  Localized edema  Muscle weakness (generalized)  Rationale for Evaluation and Treatment: Rehabilitation  ONSET DATE: October 09, 2022 surgery  SUBJECTIVE:                                                                                                                                                                                      SUBJECTIVE STATEMENT: He has some soreness from the exercises, he has been doing them.   Hand dominance: Right  PERTINENT HISTORY: OA left 1st CMC joint, right foot surgery, significant kidney history  PAIN:  Are you having pain? Yes: NPRS scale: 7/10 Pain location: Global right shoulder Pain description: Achy with occasional sharp, stabbing Aggravating factors: Most right shoulder use Relieving factors: Oxycodone day and night  PRECAUTIONS: Other: Rotator cuff repair 10/09/2022  WEIGHT BEARING RESTRICTIONS: Yes None on right upper extremity  FALLS:  Has patient fallen in last 6 months? 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 Moderate distal infraspinatus tendinosis with articular and bursal sided fraying. Mild distal subscapularis tendinosis. No significant muscle atrophy.   Mild intra-articular long head biceps tendinosis.   Mild glenohumeral and AC joint osteoarthritis.  PATIENT SURVEYS:  FOTO 32 (Goal 68 in 18 visits)  COGNITION: Overall cognitive status: Within functional limits for tasks assessed     SENSATION: Nathanyl has no complaints of peripheral pain or paresthesias related to his right shoulder surgery  POSTURE: Mild forward head, internally rotated and protracted shoulders  UPPER EXTREMITY ROM:   Passive ROM Left/Right 10/24/2022   Shoulder flexion 135/90   Shoulder extension    Shoulder abduction    Shoulder adduction    Shoulder internal rotation 70/35   Shoulder external rotation 85/10   Elbow flexion    Elbow extension    Wrist flexion    Wrist extension    Wrist ulnar deviation    Wrist radial deviation    Wrist pronation    Wrist supination    (Blank rows = not tested)  UPPER EXTREMITY Strength:  MMT Left/Right 10/24/2022   Shoulder flexion    Shoulder extension    Shoulder abduction    Shoulder adduction    Shoulder internal rotation Deferred   Shoulder external rotation Deferred   Middle trapezius    Lower trapezius    Elbow flexion    Elbow extension    Wrist flexion    Wrist extension    Wrist ulnar deviation    Wrist radial deviation    Wrist pronation    Wrist supination    Grip strength (lbs)    (Blank rows = not tested) Strength testing was deferred today secondary to being 15 days status post right rotator cuff repair   TODAY'S TREATMENT:                                                                                                                                         DATE: 10/29/2022 Seated table slide PROM: flexion, abd, ER 5 sec X 10 Codman's pendulums: Forward and back; clockwise and counterclockwise, lateral all 20 times  each Shoulder blade pinches 10 x 5 seconds Gripping green digi-grip X 20 PROM to Rt shoulder gentle to tolerance all planes  Vasopnuematic device X 10 min, medium compression, 34 deg to Rt shoulder    DATE: 10/24/2022 Codman's pendulums: Forward and back; clockwise and counterclockwise 20 times each Shoulder blade pinches 5 x 5 seconds  PATIENT EDUCATION: Education details: Reviewed exam findings, home exercises and discussed the expected course and duration of supervised physical therapy Person educated: Patient Education method: Explanation, Demonstration, Tactile cues, Verbal cues, and Handouts Education comprehension: verbalized understanding, returned demonstration, verbal cues required, tactile cues required, and needs further education  HOME EXERCISE PROGRAM: [id number]  ASSESSMENT:  CLINICAL IMPRESSION: He showed good overall tolerance to PROM program today. He did not have much guarding during manual therapy PROM as well. He will continue to benefit from PT to improve ROM.  OBJECTIVE IMPAIRMENTS: decreased activity tolerance, decreased endurance, decreased knowledge of condition, decreased ROM, decreased strength, decreased safety awareness, increased edema, impaired perceived functional ability,  increased muscle spasms, impaired UE functional use, postural dysfunction, and pain.   ACTIVITY LIMITATIONS: carrying, lifting, sleeping, toileting, dressing, reach over head, and hygiene/grooming  PARTICIPATION LIMITATIONS: meal prep, cleaning, laundry, driving, shopping, community activity, and occupation  PERSONAL FACTORS: OA left 1st CMC joint, right foot surgery, significant kidney history are also affecting patient's functional outcome.   REHAB POTENTIAL: Good  CLINICAL DECISION MAKING: Stable/uncomplicated  EVALUATION COMPLEXITY: Low   GOALS: Goals reviewed with patient? Yes  SHORT TERM GOALS: Target date: 11/21/2022  Robson will be independent with his day 1  HEP Baseline: Started 10/24/2022 Goal status: INITIAL  2.  Improve right shoulder passive range of motion for flexion to 150 degrees; internal rotation 50 degrees and external rotation to 70 degrees Baseline: 90; 35 and 10 respectively Goal status: INITIAL  3.  Orange will be able to sleep at least 4 hours uninterrupted Baseline: 1 to 2 hours Goal status: INITIAL   LONG TERM GOALS: Target date: 01/02/2023  Improve FOTO to 68 in 18 visits Baseline: 32 Goal status: INITIAL  2.  Improve right should pain to consistently 0-3/10 on the VAS. Baseline: 6-10 out of 10 Goal status: INITIAL  3.  Improve right shoulder AROM for flexion to 170 degrees; ER to 90 degrees; IR to 60 degrees and horizontal adduction to 40 degrees Baseline: See above Goal status: INITIAL  4.  Improve right shoulder strength for ER to at least 15 pounds and IR to at least 30 pounds.  60% strength right vs left is expected 12 weeks post-surgery with 90% or better strength 9-12 months post-surgery. Baseline: Deferred secondary to surgery 15 days ago at evaluation Goal status: INITIAL  5.  Ashvin will be independent with his long-term maintenance HEP at DC Baseline: Started 10/24/2022 Goal status: INITIAL   PLAN:  PT FREQUENCY: 1-2x/week  PT DURATION: 10 weeks  PLANNED INTERVENTIONS: Therapeutic exercises, Therapeutic activity, Neuromuscular re-education, Patient/Family education, Self Care, Joint mobilization, Cryotherapy, Vasopneumatic device, and Manual therapy  PLAN FOR NEXT SESSION: Focus on passive range of motion through July 4.  Start AAROM and AROM July 5.  Consider progressing resistance on scapular strengthening July 19 and consider adding resistance to rotator cuff strengthening beginning August 9th.   April Manson, PT, DPT 10/29/2022, 11:00 AM

## 2022-10-30 ENCOUNTER — Ambulatory Visit (INDEPENDENT_AMBULATORY_CARE_PROVIDER_SITE_OTHER): Payer: BC Managed Care – PPO | Admitting: Physical Therapy

## 2022-10-30 ENCOUNTER — Encounter: Payer: Self-pay | Admitting: Physical Therapy

## 2022-10-30 DIAGNOSIS — M25611 Stiffness of right shoulder, not elsewhere classified: Secondary | ICD-10-CM | POA: Diagnosis not present

## 2022-10-30 DIAGNOSIS — M25511 Pain in right shoulder: Secondary | ICD-10-CM

## 2022-10-30 DIAGNOSIS — M6281 Muscle weakness (generalized): Secondary | ICD-10-CM | POA: Diagnosis not present

## 2022-10-30 DIAGNOSIS — R600 Localized edema: Secondary | ICD-10-CM

## 2022-10-30 NOTE — Therapy (Signed)
OUTPATIENT PHYSICAL THERAPY SHOULDER TREATMENT   Patient Name: Mitchell Hughes MRN: [id number] DOB:Jun 25, 1965, 57 y.o., male Today's Date: 10/30/2022  END OF SESSION:  PT End of Session - 10/30/22 0804     Visit Number 3    Number of Visits 18    Date for PT Re-Evaluation 01/02/23    Authorization - Number of Visits 60    Progress Note Due on Visit 18    PT Start Time 0803    PT Stop Time 0844    PT Time Calculation (min) 41 min    Activity Tolerance Patient tolerated treatment well    Behavior During Therapy [facility] for tasks assessed/performed              Past Medical History:  Diagnosis Date   Arthritis    Erectile dysfunction    Family history of colon cancer    sister in 5s   Family history of heart disease 2011   baseline cardiac eval 2011 with Dr. Royann Shivers   H/O echocardiogram 2015   Dr. Donnie Aho   H/O echocardiogram 02/2019   History of exercise stress test    04/2014 Dr. Donnie Aho, prior 2011 normal Bruce treadmill stress test, Dr. Rennis Golden   History of kidney stones    LOW BACK PAIN, ACUTE 09/26/2009   OSA (obstructive sleep apnea) 01/2022   has cpap   TOBACCO USE, QUIT 09/26/2009   Wears glasses    Past Surgical History:  Procedure Laterality Date   COLONOSCOPY  08/2017   tubular adenoma, repeat 5 years; Dr. Ileene Patrick   FOOT SURGERY     bone spur, right; Dr. Rolland Bimler HERNIA REPAIR Bilateral 04/11/2022   Procedure: LAPAROSCOPIC BILATERAL INGUINAL HERNIA REPAIR with MESH;  Surgeon: Karie Soda, MD;  Location: [facility];  Service: General;  Laterality: Bilateral;  LOCAL   IR NEPHROSTOMY PLACEMENT RIGHT  04/14/2021   NEPHROLITHOTOMY Right 05/17/2021   Procedure: NEPHROLITHOTOMY PERCUTANEOUS insertion double j stent;  Surgeon: Sebastian Ache, MD;  Location: [facility];  Service: Urology;  Laterality: Right;   ROBOT ASSISTED LAPAROSCOPIC NEPHRECTOMY Left 07/26/2021   Procedure: XI ROBOTIC ASSISTED LAPAROSCOPIC NEPHRECTOMY;  Surgeon: Sebastian Ache, MD;   Location: [facility];  Service: Urology;  Laterality: Left;  3 HRS   UMBILICAL HERNIA REPAIR N/A 04/11/2022   Procedure: HERNIA REPAIR UMBILICAL ADULT;  Surgeon: Karie Soda, MD;  Location: [facility];  Service: General;  Laterality: N/A;  PRIMARY UMBILICAL HERNIA REPAIR   WISDOM TOOTH EXTRACTION     WISDOM TOOTH EXTRACTION     Patient Active Problem List   Diagnosis Date Noted   Nontraumatic complete tear of right rotator cuff 07/22/2022   Impingement syndrome of right shoulder 07/22/2022   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   Benign prostatic hyperplasia with weak urinary stream 02/24/2022   Urinary hesitancy 02/24/2022   COVID-19 vaccine administered 02/24/2022   Screening for lipid disorders 02/24/2022   History of renal cell cancer 02/24/2022   History of renal stone 02/24/2022   Umbilical hernia without obstruction and without gangrene 02/24/2022   Tendinitis of left triceps 01/21/2022   Renal stone 05/17/2021   Sepsis with acute organ dysfunction (HCC) 04/19/2021   Kidney stone 04/19/2021   Renal mass 04/19/2021   Splenomegaly 04/19/2021   Sepsis (HCC) 04/14/2021   Acute lower UTI 04/14/2021   Bilateral hand numbness 02/14/2021   Localized swelling on right hand 02/14/2021   Sleep disturbance 02/14/2021   Snoring 02/14/2021   Erectile dysfunction 02/14/2021  Need for influenza vaccination 02/14/2021   Impingement syndrome of left shoulder 06/20/2020   Primary osteoarthritis of first carpometacarpal joint of left hand 06/20/2020   Encounter for health maintenance examination in adult 02/17/2020   Screening for prostate cancer 02/17/2020   History of COVID-19 02/17/2020   Toenail deformity 02/17/2020   Neuropathy of foot, right 02/17/2020   Chronic pain of both shoulders 04/22/2019   Decreased range of motion of shoulder 04/22/2019   Degenerative arthritis of thumb, left 04/22/2019   Right inguinal hernia 01/28/2019   Aortic valve calcification 01/28/2019   Vaccine  counseling 06/22/2017   Family history of heart disease 12/21/2013   Family history of colon cancer 12/21/2013   Nephrolithiasis 09/28/2009    PCP: Jac Canavan, PA-C  REFERRING PROVIDER: Cristie Hem, PA-C  REFERRING DIAG:  Diagnosis  [phone] (ICD-10-CM) - S/P arthroscopy of right shoulder  PT eval and treat S/p right shoulder mini open rotator cuff repair  THERAPY DIAG:  Acute pain of right shoulder  Stiffness of right shoulder, not elsewhere classified  Localized edema  Muscle weakness (generalized)  Rationale for Evaluation and Treatment: Rehabilitation  ONSET DATE: October 09, 2022 surgery  SUBJECTIVE:                                                                                                                                                                                      SUBJECTIVE STATEMENT: Still doing the exercises; pain is still elevated and had trouble with pain and sleeping last night  Hand dominance: Right  PERTINENT HISTORY: OA left 1st CMC joint, right foot surgery, significant kidney history  PAIN:  Are you having pain? Yes: NPRS scale: 6-7/10 Pain location: Global right shoulder Pain description: Achy with occasional sharp, stabbing Aggravating factors: Most right shoulder use Relieving factors: Oxycodone day and night  PRECAUTIONS: Other: Rotator cuff repair 10/09/2022  WEIGHT BEARING RESTRICTIONS: Yes None on right upper extremity  FALLS:  Has patient fallen in last 6 months? 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 Moderate distal infraspinatus tendinosis with articular and bursal sided fraying. Mild distal subscapularis tendinosis. No significant muscle atrophy.   Mild intra-articular long head biceps tendinosis.   Mild glenohumeral and AC joint osteoarthritis.  PATIENT SURVEYS:  10/30/22: FOTO 32 (Goal  68 in 18 visits)  COGNITION: Overall cognitive status: Within functional limits for tasks assessed     SENSATION: Daxten has no complaints of peripheral pain or paresthesias related to his right shoulder surgery  POSTURE: Mild forward head, internally rotated and protracted shoulders  UPPER EXTREMITY ROM:   Passive ROM Left/Right 10/24/2022 Right 10/30/22  Shoulder flexion 135/90 P: 100  Shoulder extension    Shoulder abduction  P: 104  Shoulder adduction    Shoulder internal rotation 70/35   Shoulder external rotation 85/10 P: 10  (Blank rows = not tested)  UPPER EXTREMITY Strength:  MMT Left/Right 10/24/2022   Shoulder flexion    Shoulder extension    Shoulder abduction    Shoulder adduction    Shoulder internal rotation Deferred   Shoulder external rotation Deferred   (Blank rows = not tested) Strength testing was deferred today secondary to being 15 days status post right rotator cuff repair   TODAY'S TREATMENT:                                                                                                                                         DATE:  10/30/22 TherEx Scapular retraction 2x10; 5 sec hold Seated table slides scaption, flexion, ER x 10 reps Codman's pendulums: Forward and back; clockwise and counterclockwise, lateral all 20 times each  Manual PROM to Rt shoulder gentle to tolerance all planes  Modalities Vasopnuematic device X 10 min, medium compression, 34 deg to Rt shoulder  10/29/2022 Seated table slide PROM: flexion, abd, ER 5 sec X 10 Codman's pendulums: Forward and back; clockwise and counterclockwise, lateral all 20 times each Shoulder  blade pinches 10 x 5 seconds Gripping green digi-grip X 20 PROM to Rt shoulder gentle to tolerance all planes  Vasopnuematic device X 10 min, medium compression, 34 deg to Rt shoulder    10/24/2022 Codman's pendulums: Forward and back; clockwise and counterclockwise 20 times each Shoulder blade pinches 5 x 5 seconds  PATIENT EDUCATION: Education details: Reviewed exam findings, home exercises and discussed the expected course and duration of supervised physical therapy Person educated: Patient Education method: Explanation, Demonstration, Tactile cues, Verbal cues, and Handouts Education comprehension: verbalized understanding, returned demonstration, verbal cues required, tactile cues required, and needs further education  HOME EXERCISE PROGRAM: [id number]  ASSESSMENT:  CLINICAL IMPRESSION: Pt tolerated session well today with improvement in flexion PROM noted today.  Will continue to benefit from PT to maximize function.  OBJECTIVE IMPAIRMENTS: decreased activity tolerance, decreased endurance, decreased knowledge of condition, decreased ROM, decreased strength, decreased safety awareness, increased edema, impaired perceived functional ability, increased muscle spasms, impaired UE functional use, postural dysfunction, and pain.   ACTIVITY LIMITATIONS: carrying, lifting, sleeping, toileting, dressing, reach over head, and hygiene/grooming  PARTICIPATION LIMITATIONS: meal prep, cleaning, laundry, driving, shopping, community activity, and occupation  PERSONAL FACTORS: OA left 1st CMC joint, right foot surgery, significant kidney history  are also affecting patient's functional outcome.   REHAB POTENTIAL: Good  CLINICAL DECISION MAKING: Stable/uncomplicated  EVALUATION COMPLEXITY: Low   GOALS: Goals reviewed with patient? Yes  SHORT TERM GOALS: Target date: 11/21/2022  Gavino will be independent with his day 1 HEP Baseline: Started 10/24/2022 Goal status: INITIAL  2.   Improve right shoulder passive range of motion for flexion to 150 degrees; internal rotation 50 degrees and external rotation to 70 degrees Baseline: 90; 35 and 10 respectively Goal status: INITIAL  3.  Heman will be able to sleep at least 4 hours uninterrupted Baseline: 1 to 2 hours Goal status: INITIAL   LONG TERM GOALS: Target date: 01/02/2023  Improve FOTO to 68 in 18 visits Baseline: 32 Goal status: INITIAL  2.  Improve right should pain to consistently 0-3/10 on the VAS. Baseline: 6-10 out of 10 Goal status: INITIAL  3.  Improve right shoulder AROM for flexion to 170 degrees; ER to 90 degrees; IR to 60 degrees and horizontal adduction to 40 degrees Baseline: See above Goal status: INITIAL  4.  Improve right shoulder strength for ER to at least 15 pounds and IR to at least 30 pounds.  60% strength right vs left is expected 12 weeks post-surgery with 90% or better strength 9-12 months post-surgery. Baseline: Deferred secondary to surgery 15 days ago at evaluation Goal status: INITIAL  5.  Riddick will be independent with his long-term maintenance HEP at DC Baseline: Started 10/24/2022 Goal status: INITIAL   PLAN:  PT FREQUENCY: 1-2x/week  PT DURATION: 10 weeks  PLANNED INTERVENTIONS: Therapeutic exercises, Therapeutic activity, Neuromuscular re-education, Patient/Family education, Self Care, Joint mobilization, Cryotherapy, Vasopneumatic device, and Manual therapy  PLAN FOR NEXT SESSION: Continue passive range of motion through July 4.  Start AAROM and AROM July 5.  Consider progressing resistance on scapular strengthening July 19 and consider adding resistance to rotator cuff strengthening beginning August 9th.   Moshe Cipro, PT, DPT 10/30/2022, 8:37 AM

## 2022-11-02 DIAGNOSIS — G4733 Obstructive sleep apnea (adult) (pediatric): Secondary | ICD-10-CM | POA: Diagnosis not present

## 2022-11-10 ENCOUNTER — Encounter: Payer: Self-pay | Admitting: Physical Therapy

## 2022-11-10 ENCOUNTER — Ambulatory Visit (INDEPENDENT_AMBULATORY_CARE_PROVIDER_SITE_OTHER): Payer: BC Managed Care – PPO | Admitting: Physical Therapy

## 2022-11-10 ENCOUNTER — Encounter: Payer: BC Managed Care – PPO | Admitting: Physical Therapy

## 2022-11-10 DIAGNOSIS — M25611 Stiffness of right shoulder, not elsewhere classified: Secondary | ICD-10-CM

## 2022-11-10 DIAGNOSIS — R600 Localized edema: Secondary | ICD-10-CM | POA: Diagnosis not present

## 2022-11-10 DIAGNOSIS — M25511 Pain in right shoulder: Secondary | ICD-10-CM | POA: Diagnosis not present

## 2022-11-10 DIAGNOSIS — M6281 Muscle weakness (generalized): Secondary | ICD-10-CM | POA: Diagnosis not present

## 2022-11-10 NOTE — Therapy (Signed)
OUTPATIENT PHYSICAL THERAPY SHOULDER TREATMENT   Patient Name: Mitchell Hughes MRN: [id number] DOB:1966/01/07, 57 y.o., male Today's Date: 11/10/2022  END OF SESSION:  PT End of Session - 11/10/22 0838     Visit Number 4    Number of Visits 18    Date for PT Re-Evaluation 01/02/23    Authorization - Number of Visits 60    Progress Note Due on Visit 18    PT Start Time 0840    PT Stop Time 0921    PT Time Calculation (min) 41 min    Activity Tolerance Patient tolerated treatment well    Behavior During Therapy [facility] for tasks assessed/performed               Past Medical History:  Diagnosis Date   Arthritis    Erectile dysfunction    Family history of colon cancer    sister in 27s   Family history of heart disease 2011   baseline cardiac eval 2011 with Dr. Royann Shivers   H/O echocardiogram 2015   Dr. Donnie Aho   H/O echocardiogram 02/2019   History of exercise stress test    04/2014 Dr. Donnie Aho, prior 2011 normal Bruce treadmill stress test, Dr. Rennis Golden   History of kidney stones    LOW BACK PAIN, ACUTE 09/26/2009   OSA (obstructive sleep apnea) 01/2022   has cpap   TOBACCO USE, QUIT 09/26/2009   Wears glasses    Past Surgical History:  Procedure Laterality Date   COLONOSCOPY  08/2017   tubular adenoma, repeat 5 years; Dr. Ileene Patrick   FOOT SURGERY     bone spur, right; Dr. Rolland Bimler HERNIA REPAIR Bilateral 04/11/2022   Procedure: LAPAROSCOPIC BILATERAL INGUINAL HERNIA REPAIR with MESH;  Surgeon: Karie Soda, MD;  Location: [facility];  Service: General;  Laterality: Bilateral;  LOCAL   IR NEPHROSTOMY PLACEMENT RIGHT  04/14/2021   NEPHROLITHOTOMY Right 05/17/2021   Procedure: NEPHROLITHOTOMY PERCUTANEOUS insertion double j stent;  Surgeon: Sebastian Ache, MD;  Location: [facility];  Service: Urology;  Laterality: Right;   ROBOT ASSISTED LAPAROSCOPIC NEPHRECTOMY Left 07/26/2021   Procedure: XI ROBOTIC ASSISTED LAPAROSCOPIC NEPHRECTOMY;  Surgeon: Sebastian Ache, MD;   Location: [facility];  Service: Urology;  Laterality: Left;  3 HRS   UMBILICAL HERNIA REPAIR N/A 04/11/2022   Procedure: HERNIA REPAIR UMBILICAL ADULT;  Surgeon: Karie Soda, MD;  Location: [facility];  Service: General;  Laterality: N/A;  PRIMARY UMBILICAL HERNIA REPAIR   WISDOM TOOTH EXTRACTION     WISDOM TOOTH EXTRACTION     Patient Active Problem List   Diagnosis Date Noted   Nontraumatic complete tear of right rotator cuff 07/22/2022   Impingement syndrome of right shoulder 07/22/2022   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   Benign prostatic hyperplasia with weak urinary stream 02/24/2022   Urinary hesitancy 02/24/2022   COVID-19 vaccine administered 02/24/2022   Screening for lipid disorders 02/24/2022   History of renal cell cancer 02/24/2022   History of renal stone 02/24/2022   Umbilical hernia without obstruction and without gangrene 02/24/2022   Tendinitis of left triceps 01/21/2022   Renal stone 05/17/2021   Sepsis with acute organ dysfunction (HCC) 04/19/2021   Kidney stone 04/19/2021   Renal mass 04/19/2021   Splenomegaly 04/19/2021   Sepsis (HCC) 04/14/2021   Acute lower UTI 04/14/2021   Bilateral hand numbness 02/14/2021   Localized swelling on right hand 02/14/2021   Sleep disturbance 02/14/2021   Snoring 02/14/2021   Erectile dysfunction 02/14/2021  Need for influenza vaccination 02/14/2021   Impingement syndrome of left shoulder 06/20/2020   Primary osteoarthritis of first carpometacarpal joint of left hand 06/20/2020   Encounter for health maintenance examination in adult 02/17/2020   Screening for prostate cancer 02/17/2020   History of COVID-19 02/17/2020   Toenail deformity 02/17/2020   Neuropathy of foot, right 02/17/2020   Chronic pain of both shoulders 04/22/2019   Decreased range of motion of shoulder 04/22/2019   Degenerative arthritis of thumb, left 04/22/2019   Right inguinal hernia 01/28/2019   Aortic valve calcification 01/28/2019   Vaccine  counseling 06/22/2017   Family history of heart disease 12/21/2013   Family history of colon cancer 12/21/2013   Nephrolithiasis 09/28/2009    PCP: Jac Canavan, PA-C  REFERRING PROVIDER: Cristie Hem, PA-C  REFERRING DIAG:  Diagnosis  [phone] (ICD-10-CM) - S/P arthroscopy of right shoulder  PT eval and treat S/p right shoulder mini open rotator cuff repair  THERAPY DIAG:  Acute pain of right shoulder  Stiffness of right shoulder, not elsewhere classified  Localized edema  Muscle weakness (generalized)  Rationale for Evaluation and Treatment: Rehabilitation  ONSET DATE: October 09, 2022 surgery  SUBJECTIVE:                                                                                                                                                                                      SUBJECTIVE STATEMENT: Sleep is better; no pain today just soreness   Hand dominance: Right  PERTINENT HISTORY: OA left 1st CMC joint, right foot surgery, significant kidney history  PAIN:  Are you having pain? Yes: NPRS scale: 0 currently, up to 7/10 Pain location: Global right shoulder Pain description: Achy with occasional sharp, stabbing Aggravating factors: Most right shoulder use Relieving factors: Oxycodone day and night  PRECAUTIONS: Other: Rotator cuff repair 10/09/2022  WEIGHT BEARING RESTRICTIONS: Yes None on right upper extremity  FALLS:  Has patient fallen in last 6 months? 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 Moderate distal infraspinatus tendinosis with articular and  bursal sided fraying. Mild distal subscapularis tendinosis. No significant muscle atrophy.   Mild intra-articular long head biceps tendinosis.   Mild glenohumeral and AC joint osteoarthritis.  PATIENT SURVEYS:  10/30/22: FOTO 32 (Goal 68 in 18 visits)  COGNITION: Overall cognitive status: Within  functional limits for tasks assessed     SENSATION: Ramar has no complaints of peripheral pain or paresthesias related to his right shoulder surgery  POSTURE: Mild forward head, internally rotated and protracted shoulders  UPPER EXTREMITY ROM:   Passive ROM Left/Right 10/24/2022 Right 10/30/22  Shoulder flexion 135/90 P: 100  Shoulder extension    Shoulder abduction  P: 104  Shoulder adduction    Shoulder internal rotation 70/35   Shoulder external rotation 85/10 P: 10  (Blank rows = not tested)  UPPER EXTREMITY Strength:  MMT Left/Right 10/24/2022   Shoulder flexion    Shoulder extension    Shoulder abduction    Shoulder adduction    Shoulder internal rotation Deferred   Shoulder external rotation Deferred   (Blank rows = not tested) Strength testing was deferred today secondary to being 15 days status post right rotator cuff repair   TODAY'S TREATMENT:                                                                                                                                         DATE:  11/10/22 TherEx Scapular retraction 2x10; 5 sec hold Supine AA chest press 1# bar x10 Supine AA chest press with overhead flexion 1# bar; 2x10 Supine AA ER on Rt with 1# bar 2x10 The following exercises performed with BFR Seated bicep curl x30 reps; 3x15 with 30 sec rest between sets Deltoid isometrics 10 x 5 sec hold (3-way) with 30 sec rest between sets  Manual PROM to Rt shoulder gentle to tolerance all planes x 8 min; with BFR x 5 min  BFR settings: Cuff size 2 Supine, sitting and standing LOP 140 mmHg (exercised at 70 mmHg)   10/30/22 TherEx Scapular retraction 2x10; 5 sec  hold Seated table slides scaption, flexion, ER x 10 reps Codman's pendulums: Forward and back; clockwise and counterclockwise, lateral all 20 times each  Manual PROM to Rt shoulder gentle to tolerance all planes  Modalities Vasopnuematic device X 10 min, medium compression, 34 deg to Rt shoulder  10/29/2022 Seated table slide PROM: flexion, abd, ER 5 sec X 10 Codman's pendulums: Forward and back; clockwise and counterclockwise, lateral all 20 times each Shoulder blade pinches 10 x 5 seconds Gripping green digi-grip X 20 PROM to Rt shoulder gentle to tolerance all planes  Vasopnuematic device X 10 min, medium compression, 34 deg to Rt shoulder    10/24/2022 Codman's pendulums: Forward and back; clockwise and counterclockwise 20 times each Shoulder blade pinches 5 x 5 seconds  PATIENT EDUCATION: Education details: Reviewed exam findings, home exercises and discussed the expected course and duration of supervised physical therapy Person educated: Patient Education method: Explanation, Demonstration, Tactile cues, Verbal cues, and Handouts Education comprehension: verbalized understanding, returned demonstration, verbal cues required, tactile cues required, and needs further education  HOME EXERCISE PROGRAM: Access Code: [email or web address] URL: [email or web address] Date: 11/10/2022 Prepared by: Moshe Cipro  Exercises - Pendulums  - 5 x daily - 7 x weekly - 1 sets - 20-30 reps - Standing Scapular Retraction  - 5 x daily - 7 x weekly - 1 sets - 5 reps - 5 second hold - Supine Shoulder Press AAROM in Abduction with Dowel  - 2-3 x daily - 7 x weekly - 3 sets - 10 reps - Supine Shoulder Flexion Extension AAROM with Dowel  - 2-3 x daily - 7 x weekly - 3 sets - 10 reps - Supine Shoulder External Rotation with Dowel  - 2-3 x daily - 7 x weekly - 1 sets - 10 reps - 1-2 sec hold  ASSESSMENT:  CLINICAL IMPRESSION: Initiated BFR today with good tolerance to activity today.   Overall progressing well with PT at this time.    OBJECTIVE IMPAIRMENTS: decreased activity tolerance, decreased endurance, decreased knowledge of condition, decreased ROM, decreased strength, decreased safety awareness, increased edema, impaired perceived functional ability, increased muscle spasms, impaired UE functional use, postural dysfunction, and pain.   ACTIVITY LIMITATIONS: carrying, lifting, sleeping, toileting, dressing, reach over head, and hygiene/grooming  PARTICIPATION LIMITATIONS: meal prep, cleaning, laundry, driving, shopping, community activity, and occupation  PERSONAL FACTORS: OA left 1st CMC joint, right foot surgery, significant kidney history are also affecting patient's functional outcome.   REHAB POTENTIAL: Good  CLINICAL DECISION MAKING: Stable/uncomplicated  EVALUATION COMPLEXITY: Low   GOALS: Goals reviewed with patient? Yes  SHORT TERM GOALS: Target date: 11/21/2022  Leor will be independent with his day 1 HEP Baseline: Started 10/24/2022 Goal status: INITIAL  2.  Improve right shoulder passive range of motion for flexion to 150 degrees; internal rotation 50 degrees and external rotation to 70 degrees Baseline: 90; 35 and 10 respectively Goal status: INITIAL  3.  Demaryius will be able to sleep at least 4 hours uninterrupted Baseline: 1 to 2 hours Goal status: INITIAL   LONG TERM GOALS: Target date: 01/02/2023  Improve FOTO to 68 in 18 visits Baseline: 32 Goal status: INITIAL  2.  Improve right should pain to consistently 0-3/10 on the VAS. Baseline: 6-10 out of 10 Goal status: INITIAL  3.  Improve right shoulder AROM for flexion to 170 degrees; ER to 90 degrees; IR to 60 degrees and horizontal adduction to 40 degrees Baseline: See above Goal status: INITIAL  4.  Improve right shoulder strength for ER to at least 15 pounds and IR to at least 30 pounds.  60% strength right vs left is expected 12 weeks post-surgery with 90% or better strength  9-12 months post-surgery. Baseline: Deferred secondary to surgery 15 days ago at evaluation Goal status: INITIAL  5.  Piere will be independent with his long-term maintenance HEP at DC Baseline: Started 10/24/2022 Goal status: INITIAL   PLAN:  PT FREQUENCY: 1-2x/week  PT DURATION: 10 weeks  PLANNED INTERVENTIONS: Therapeutic exercises, Therapeutic activity, Neuromuscular re-education, Patient/Family education, Self Care, Joint mobilization, Cryotherapy, Vasopneumatic device, and Manual therapy  PLAN FOR NEXT SESSION: continue AAROM and AROM July 5.  Consider progressing resistance on scapular strengthening July 19 and consider adding resistance to rotator cuff strengthening beginning August 9th.   NEXT MD VISIT: 11/20/2022 at 10:30   Moshe Cipro, PT, DPT 11/10/2022, 9:25 AM

## 2022-11-12 ENCOUNTER — Encounter: Payer: BC Managed Care – PPO | Admitting: Physical Therapy

## 2022-11-17 ENCOUNTER — Ambulatory Visit: Payer: BC Managed Care – PPO | Admitting: Physical Therapy

## 2022-11-17 ENCOUNTER — Encounter: Payer: Self-pay | Admitting: Physical Therapy

## 2022-11-17 DIAGNOSIS — M25511 Pain in right shoulder: Secondary | ICD-10-CM | POA: Diagnosis not present

## 2022-11-17 DIAGNOSIS — M25611 Stiffness of right shoulder, not elsewhere classified: Secondary | ICD-10-CM | POA: Diagnosis not present

## 2022-11-17 DIAGNOSIS — M6281 Muscle weakness (generalized): Secondary | ICD-10-CM

## 2022-11-17 DIAGNOSIS — R600 Localized edema: Secondary | ICD-10-CM

## 2022-11-17 NOTE — Therapy (Signed)
OUTPATIENT PHYSICAL THERAPY SHOULDER TREATMENT   Patient Name: Mitchell Hughes MRN: [id number] DOB:Nov 13, 1965, 57 y.o., male Today's Date: 11/17/2022  END OF SESSION:  PT End of Session - 11/17/22 0803     Visit Number 5    Number of Visits 18    Date for PT Re-Evaluation 01/02/23    Authorization - Number of Visits 60    Progress Note Due on Visit 18    PT Start Time 0803    PT Stop Time 0843    PT Time Calculation (min) 40 min    Activity Tolerance Patient tolerated treatment well    Behavior During Therapy [facility] for tasks assessed/performed                Past Medical History:  Diagnosis Date   Arthritis    Erectile dysfunction    Family history of colon cancer    sister in 54s   Family history of heart disease 2011   baseline cardiac eval 2011 with Dr. Royann Shivers   H/O echocardiogram 2015   Dr. Donnie Aho   H/O echocardiogram 02/2019   History of exercise stress test    04/2014 Dr. Donnie Aho, prior 2011 normal Bruce treadmill stress test, Dr. Rennis Golden   History of kidney stones    LOW BACK PAIN, ACUTE 09/26/2009   OSA (obstructive sleep apnea) 01/2022   has cpap   TOBACCO USE, QUIT 09/26/2009   Wears glasses    Past Surgical History:  Procedure Laterality Date   COLONOSCOPY  08/2017   tubular adenoma, repeat 5 years; Dr. Ileene Patrick   FOOT SURGERY     bone spur, right; Dr. Rolland Bimler HERNIA REPAIR Bilateral 04/11/2022   Procedure: LAPAROSCOPIC BILATERAL INGUINAL HERNIA REPAIR with MESH;  Surgeon: Karie Soda, MD;  Location: [facility];  Service: General;  Laterality: Bilateral;  LOCAL   IR NEPHROSTOMY PLACEMENT RIGHT  04/14/2021   NEPHROLITHOTOMY Right 05/17/2021   Procedure: NEPHROLITHOTOMY PERCUTANEOUS insertion double j stent;  Surgeon: Sebastian Ache, MD;  Location: [facility];  Service: Urology;  Laterality: Right;   ROBOT ASSISTED LAPAROSCOPIC NEPHRECTOMY Left 07/26/2021   Procedure: XI ROBOTIC ASSISTED LAPAROSCOPIC NEPHRECTOMY;  Surgeon: Sebastian Ache,  MD;  Location: [facility];  Service: Urology;  Laterality: Left;  3 HRS   UMBILICAL HERNIA REPAIR N/A 04/11/2022   Procedure: HERNIA REPAIR UMBILICAL ADULT;  Surgeon: Karie Soda, MD;  Location: [facility];  Service: General;  Laterality: N/A;  PRIMARY UMBILICAL HERNIA REPAIR   WISDOM TOOTH EXTRACTION     WISDOM TOOTH EXTRACTION     Patient Active Problem List   Diagnosis Date Noted   Nontraumatic complete tear of right rotator cuff 07/22/2022   Impingement syndrome of right shoulder 07/22/2022   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   Benign prostatic hyperplasia with weak urinary stream 02/24/2022   Urinary hesitancy 02/24/2022   COVID-19 vaccine administered 02/24/2022   Screening for lipid disorders 02/24/2022   History of renal cell cancer 02/24/2022   History of renal stone 02/24/2022   Umbilical hernia without obstruction and without gangrene 02/24/2022   Tendinitis of left triceps 01/21/2022   Renal stone 05/17/2021   Sepsis with acute organ dysfunction (HCC) 04/19/2021   Kidney stone 04/19/2021   Renal mass 04/19/2021   Splenomegaly 04/19/2021   Sepsis (HCC) 04/14/2021   Acute lower UTI 04/14/2021   Bilateral hand numbness 02/14/2021   Localized swelling on right hand 02/14/2021   Sleep disturbance 02/14/2021   Snoring 02/14/2021   Erectile dysfunction  02/14/2021   Need for influenza vaccination 02/14/2021   Impingement syndrome of left shoulder 06/20/2020   Primary osteoarthritis of first carpometacarpal joint of left hand 06/20/2020   Encounter for health maintenance examination in adult 02/17/2020   Screening for prostate cancer 02/17/2020   History of COVID-19 02/17/2020   Toenail deformity 02/17/2020   Neuropathy of foot, right 02/17/2020   Chronic pain of both shoulders 04/22/2019   Decreased range of motion of shoulder 04/22/2019   Degenerative arthritis of thumb, left 04/22/2019   Right inguinal hernia 01/28/2019   Aortic valve calcification 01/28/2019    Vaccine counseling 06/22/2017   Family history of heart disease 12/21/2013   Family history of colon cancer 12/21/2013   Nephrolithiasis 09/28/2009    PCP: Jac Canavan, PA-C  REFERRING PROVIDER: Cristie Hem, PA-C  REFERRING DIAG:  Diagnosis  [phone] (ICD-10-CM) - S/P arthroscopy of right shoulder  PT eval and treat S/p right shoulder mini open rotator cuff repair  THERAPY DIAG:  Acute pain of right shoulder  Stiffness of right shoulder, not elsewhere classified  Localized edema  Muscle weakness (generalized)  Rationale for Evaluation and Treatment: Rehabilitation  ONSET DATE: October 09, 2022 surgery  SUBJECTIVE:                                                                                                                                                                                      SUBJECTIVE STATEMENT: Shoulder is sore, denies pain  Hand dominance: Right  PERTINENT HISTORY: OA left 1st CMC joint, right foot surgery, significant kidney history  PAIN:  Are you having pain? Yes: NPRS scale: 0 currently, up to 7/10 Pain location: Global right shoulder Pain description: Achy with occasional sharp, stabbing Aggravating factors: Most right shoulder use Relieving factors: Oxycodone day and night  PRECAUTIONS: Other: Rotator cuff repair 10/09/2022  WEIGHT BEARING RESTRICTIONS: Yes None on right upper extremity  FALLS:  Has patient fallen in last 6 months? 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 Moderate distal infraspinatus tendinosis with articular and bursal  sided fraying. Mild distal subscapularis tendinosis. No significant muscle atrophy.   Mild intra-articular long head biceps tendinosis.   Mild glenohumeral and AC joint osteoarthritis.  PATIENT SURVEYS:  10/30/22: FOTO 32 (Goal 68 in 18 visits)  COGNITION: Overall cognitive status: Within functional  limits for tasks assessed     SENSATION: Radin has no complaints of peripheral pain or paresthesias related to his right shoulder surgery  POSTURE: Mild forward head, internally rotated and protracted shoulders  UPPER EXTREMITY ROM:   Passive ROM Left/Right 10/24/2022 Right 10/30/22 Right 11/17/22  Shoulder flexion 135/90 P: 100 P: 134  Shoulder extension     Shoulder abduction  P: 104 P: 120  Shoulder adduction     Shoulder internal rotation 70/35    Shoulder external rotation 85/10 P: 10 P: 30  (Blank rows = not tested)  UPPER EXTREMITY Strength:  MMT Left/Right 10/24/2022   Shoulder flexion    Shoulder extension    Shoulder abduction    Shoulder adduction    Shoulder internal rotation Deferred   Shoulder external rotation Deferred   (Blank rows = not tested) Strength testing was deferred today secondary to being 15 days status post right rotator cuff repair   TODAY'S TREATMENT:                                                                                                                                         DATE:  11/17/22 TherEx Pulleys flexion and scaption x 3 min each ROM measurements - see above for details The following exercises performed with BFR Supine AA chest press with overhead flexion 1# bar; 3x15 Supine AA ER on Rt with 1# bar 3x15 Standing deltoid isometrics 15 x 5 sec hold (-way) with 30 sec rest between sets  BFR settings: Cuff size 2 Supine, sitting and standing LOP 150 mmHg (exercised at 75 mmHg)  11/10/22 TherEx Scapular retraction 2x10; 5 sec hold Supine AA chest press 1# bar x10 Supine AA chest press with overhead flexion 1# bar;  2x10 Supine AA ER on Rt with 1# bar 2x10 The following exercises performed with BFR Seated bicep curl x30 reps; 3x15 with 30 sec rest between sets Deltoid isometrics 10 x 5 sec hold (3-way) with 30 sec rest between sets  Manual PROM to Rt shoulder gentle to tolerance all planes x 8 min; with BFR x 5 min  BFR settings: Cuff size 2 Supine, sitting and standing LOP 140 mmHg (exercised at 70 mmHg)   10/30/22 TherEx Scapular retraction 2x10; 5 sec hold Seated table slides scaption, flexion, ER x 10 reps Codman's pendulums: Forward and back; clockwise and counterclockwise, lateral all 20 times each  Manual PROM to Rt shoulder gentle to tolerance all planes  Modalities Vasopnuematic device X 10 min, medium compression, 34 deg to Rt shoulder  10/29/2022 Seated table slide PROM: flexion, abd, ER 5 sec X 10 Codman's pendulums: Forward and back; clockwise and counterclockwise, lateral all 20 times each Shoulder blade pinches 10 x 5 seconds Gripping green digi-grip X 20 PROM to Rt shoulder gentle to tolerance all planes  Vasopnuematic device X 10 min, medium compression, 34 deg to Rt shoulder  10/24/2022 Codman's pendulums: Forward and back; clockwise and counterclockwise 20 times each Shoulder blade pinches 5 x 5 seconds  PATIENT EDUCATION: Education details: Reviewed exam findings, home exercises and discussed the expected course and duration of supervised physical therapy Person educated: Patient Education method: Explanation, Demonstration, Tactile cues, Verbal cues, and Handouts Education comprehension: verbalized understanding, returned demonstration, verbal cues required, tactile cues required, and needs further education  HOME EXERCISE PROGRAM: Access Code: [email or web address] URL: [email or web address] Date: 11/10/2022 Prepared by: Moshe Cipro  Exercises - Pendulums  - 5 x daily - 7 x weekly - 1 sets - 20-30 reps - Standing Scapular Retraction  - 5 x daily  - 7 x weekly - 1 sets - 5 reps - 5 second hold - Supine Shoulder Press AAROM in Abduction with Dowel  - 2-3 x daily - 7 x weekly - 3 sets - 10 reps - Supine Shoulder Flexion Extension AAROM with Dowel  - 2-3 x daily - 7 x weekly - 3 sets - 10 reps - Supine Shoulder External Rotation with Dowel  - 2-3 x daily - 7 x weekly - 1 sets - 10 reps - 1-2 sec hold  ASSESSMENT:  CLINICAL IMPRESSION: Pt tolerated session well today with continuation of BFR with AA exercises and deltoid isometrics.  ROM steadily improving at this time, not quite to goal yet.  Will continue to benefit from PT to maximize function.  OBJECTIVE IMPAIRMENTS: decreased activity tolerance, decreased endurance, decreased knowledge of condition, decreased ROM, decreased strength, decreased safety awareness, increased edema, impaired perceived functional ability, increased muscle spasms, impaired UE functional use, postural dysfunction, and pain.   ACTIVITY LIMITATIONS: carrying, lifting, sleeping, toileting, dressing, reach over head, and hygiene/grooming  PARTICIPATION LIMITATIONS: meal prep, cleaning, laundry, driving, shopping, community activity, and occupation  PERSONAL FACTORS: OA left 1st CMC joint, right foot surgery, significant kidney history are also affecting patient's functional outcome.   REHAB POTENTIAL: Good  CLINICAL DECISION MAKING: Stable/uncomplicated  EVALUATION COMPLEXITY: Low   GOALS: Goals reviewed with patient? Yes  SHORT TERM GOALS: Target date: 11/21/2022  Taegen will be independent with his day 1 HEP Baseline: Started 10/24/2022 Goal status: MET 11/17/22  2.  Improve right shoulder passive range of motion for flexion to 150 degrees; internal rotation 50 degrees and external rotation to 70 degrees  Goal status: IN PROGRESS 11/17/22  3.  Athanasius will be able to sleep at least 4 hours uninterrupted Baseline: 1 to 2 hours Goal status: MET 11/17/22   LONG TERM GOALS: Target date:  01/02/2023  Improve FOTO to 68 in 18 visits Baseline: 32 Goal status: INITIAL  2.  Improve right should pain to consistently 0-3/10 on the VAS. Baseline: 6-10 out of 10 Goal status: INITIAL  3.  Improve right shoulder AROM for flexion to 170 degrees; ER to 90 degrees; IR to 60 degrees and horizontal adduction to 40 degrees Baseline: See above Goal status: INITIAL  4.  Improve right shoulder strength for ER to at least 15 pounds and IR to at least 30 pounds.  60% strength right vs left is expected 12 weeks post-surgery with 90% or better strength 9-12 months post-surgery. Baseline: Deferred secondary to surgery 15 days ago at evaluation Goal status: INITIAL  5.  Philip will be independent with his long-term maintenance HEP at DC Baseline: Started 10/24/2022 Goal status: INITIAL   PLAN:  PT FREQUENCY: 1-2x/week  PT DURATION: 10 weeks  PLANNED INTERVENTIONS: Therapeutic exercises, Therapeutic activity, Neuromuscular re-education, Patient/Family education, Self Care, Joint mobilization,  Cryotherapy, Vasopneumatic device, and Manual therapy  PLAN FOR NEXT SESSION: see what MD says, continue AAROM progressing into AROM.  Consider progressing resistance on scapular strengthening July 19 and consider adding resistance to rotator cuff strengthening beginning August 9th.   NEXT MD VISIT: 11/18/22   Moshe Cipro, PT, DPT 11/17/2022, 10:56 AM

## 2022-11-18 ENCOUNTER — Ambulatory Visit (INDEPENDENT_AMBULATORY_CARE_PROVIDER_SITE_OTHER): Payer: BC Managed Care – PPO | Admitting: Orthopaedic Surgery

## 2022-11-18 DIAGNOSIS — Z9889 Other specified postprocedural states: Secondary | ICD-10-CM

## 2022-11-18 DIAGNOSIS — M7541 Impingement syndrome of right shoulder: Secondary | ICD-10-CM

## 2022-11-18 DIAGNOSIS — M75121 Complete rotator cuff tear or rupture of right shoulder, not specified as traumatic: Secondary | ICD-10-CM

## 2022-11-18 NOTE — Progress Notes (Signed)
Post-Op Visit Note   Patient: Mitchell Hughes           Date of Birth: Mar 06, 1966           MRN: [id number] Visit Date: 11/18/2022 PCP: Jac Canavan, PA-C   Assessment & Plan:  Chief Complaint:  Chief Complaint  Patient presents with   Right Shoulder - Routine Post Op   Visit Diagnoses:  1. S/P arthroscopy of right shoulder   2. Nontraumatic complete tear of right rotator cuff   3. Impingement syndrome of right shoulder     Plan: Mitchell Hughes is 6 weeks status post right rotator cuff repair.  He has mild pain at times but more soreness.  He is doing physical therapy twice a week and he does feel that it is coming along.  Examination of the right shoulder shows fully healed surgical scars.  His range of motion is progressing nicely.  His strength is at an expected level.  Overall I am happy with his progress from the surgery.  He can discontinue the sling.  He can begin strengthening at this time.  Will see him back in 6 weeks for recheck.  Follow-Up Instructions: Return in about 6 weeks (around 12/30/2022).   Orders:  No orders of the defined types were placed in this encounter.  No orders of the defined types were placed in this encounter.   Imaging: No results found.  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   Benign prostatic hyperplasia with weak urinary stream 02/24/2022   Urinary hesitancy 02/24/2022   COVID-19 vaccine administered 02/24/2022   Screening for lipid disorders 02/24/2022   History of renal cell cancer 02/24/2022   History of renal stone 02/24/2022   Umbilical hernia without obstruction and without gangrene 02/24/2022   Tendinitis of left triceps 01/21/2022   Renal stone 05/17/2021   Sepsis with acute organ dysfunction (HCC) 04/19/2021   Kidney stone 04/19/2021    Renal mass 04/19/2021   Splenomegaly 04/19/2021   Sepsis (HCC) 04/14/2021   Acute lower UTI 04/14/2021   Bilateral hand numbness 02/14/2021   Localized swelling on right hand 02/14/2021   Sleep disturbance 02/14/2021   Snoring 02/14/2021   Erectile dysfunction 02/14/2021   Need for influenza vaccination 02/14/2021   Impingement syndrome of left shoulder 06/20/2020   Primary osteoarthritis of first carpometacarpal joint of left hand 06/20/2020   Encounter for health maintenance examination in adult 02/17/2020   Screening for prostate cancer 02/17/2020   History of COVID-19 02/17/2020   Toenail deformity 02/17/2020   Neuropathy of foot, right 02/17/2020   Chronic pain of both shoulders 04/22/2019   Decreased range of motion of shoulder 04/22/2019   Degenerative arthritis of thumb, left 04/22/2019   Right inguinal hernia 01/28/2019   Aortic valve calcification 01/28/2019   Vaccine counseling 06/22/2017   Family history of heart disease 12/21/2013   Family history of colon cancer 12/21/2013   Nephrolithiasis 09/28/2009   Past Medical History:  Diagnosis Date   Arthritis    Erectile dysfunction    Family history of colon cancer    sister in 45s   Family history of heart disease 2011   baseline cardiac eval 2011 with Dr. Royann Shivers   H/O echocardiogram 2015   Dr. Donnie Aho   H/O echocardiogram 02/2019   History of exercise stress  test    04/2014 Dr. Donnie Aho, prior 2011 normal Bruce treadmill stress test, Dr. Rennis Golden   History of kidney stones    LOW BACK PAIN, ACUTE 09/26/2009   OSA (obstructive sleep apnea) 01/2022   has cpap   TOBACCO USE, QUIT 09/26/2009   Wears glasses     Family History  Problem Relation Age of Onset   Thyroid disease Mother    Other Mother        brain disease, possibly dementia?   Gallstones Mother    Other Father        spinal cord injury/accident   Heart disease Father 42   Heart disease Sister 26       artificial valve   Colon cancer Sister     Sleep apnea Sister    Cancer Sister 63       colon   Sleep apnea Sister    Cancer Sister        pancreas   Pancreatic cancer Sister    Heart disease Sister    Heart disease Brother 69       MI   Diabetes Brother    Pancreatic cancer Brother    Heart disease Brother 41       pacemaker   Cancer Brother        liver, formerly pancreas   Heart disease Other        parent, other relative   Colon cancer Other    Stroke Neg Hx    Hypertension Neg Hx    Rectal cancer Neg Hx    Stomach cancer Neg Hx     Past Surgical History:  Procedure Laterality Date   COLONOSCOPY  08/2017   tubular adenoma, repeat 5 years; Dr. Ileene Patrick   FOOT SURGERY     bone spur, right; Dr. Rolland Bimler HERNIA REPAIR Bilateral 04/11/2022   Procedure: LAPAROSCOPIC BILATERAL INGUINAL HERNIA REPAIR with MESH;  Surgeon: Karie Soda, MD;  Location: [facility];  Service: General;  Laterality: Bilateral;  LOCAL   IR NEPHROSTOMY PLACEMENT RIGHT  04/14/2021   NEPHROLITHOTOMY Right 05/17/2021   Procedure: NEPHROLITHOTOMY PERCUTANEOUS insertion double j stent;  Surgeon: Sebastian Ache, MD;  Location: [facility];  Service: Urology;  Laterality: Right;   ROBOT ASSISTED LAPAROSCOPIC NEPHRECTOMY Left 07/26/2021   Procedure: XI ROBOTIC ASSISTED LAPAROSCOPIC NEPHRECTOMY;  Surgeon: Sebastian Ache, MD;  Location: [facility];  Service: Urology;  Laterality: Left;  3 HRS   UMBILICAL HERNIA REPAIR N/A 04/11/2022   Procedure: HERNIA REPAIR UMBILICAL ADULT;  Surgeon: Karie Soda, MD;  Location: [facility];  Service: General;  Laterality: N/A;  PRIMARY UMBILICAL HERNIA REPAIR   WISDOM TOOTH EXTRACTION     WISDOM TOOTH EXTRACTION     Social History   Occupational History   Not on file  Tobacco Use   Smoking status: Former    Current packs/day: 0.00    Average packs/day: 0.3 packs/day for 30.0 years (7.5 ttl pk-yrs)    Types: Cigarettes    Start date: 03/06/1979    Quit date: 03/05/2009    Years since quitting: 13.7   Smokeless  tobacco: Never   Tobacco comments:    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

## 2022-11-19 ENCOUNTER — Encounter: Payer: Self-pay | Admitting: Rehabilitative and Restorative Service Providers"

## 2022-11-19 ENCOUNTER — Ambulatory Visit: Payer: BC Managed Care – PPO | Admitting: Physical Therapy

## 2022-11-19 DIAGNOSIS — M25611 Stiffness of right shoulder, not elsewhere classified: Secondary | ICD-10-CM | POA: Diagnosis not present

## 2022-11-19 DIAGNOSIS — R600 Localized edema: Secondary | ICD-10-CM | POA: Diagnosis not present

## 2022-11-19 DIAGNOSIS — M6281 Muscle weakness (generalized): Secondary | ICD-10-CM | POA: Diagnosis not present

## 2022-11-19 DIAGNOSIS — M25511 Pain in right shoulder: Secondary | ICD-10-CM

## 2022-11-19 NOTE — Therapy (Signed)
OUTPATIENT PHYSICAL THERAPY TREATMENT   Patient Name: Mitchell Hughes MRN: [id number] DOB:Aug 23, 1965, 57 y.o., male Today's Date: 11/19/2022  END OF SESSION:  PT End of Session - 11/19/22 0925     Visit Number 6    Number of Visits 18    Date for PT Re-Evaluation 01/02/23    Authorization Type [employer] 20% coinsurance    Authorization - Number of Visits 60    Progress Note Due on Visit 18    PT Start Time 0925    PT Stop Time 1005    PT Time Calculation (min) 40 min    Activity Tolerance Patient tolerated treatment well    Behavior During Therapy [facility] for tasks assessed/performed                 Past Medical History:  Diagnosis Date   Arthritis    Erectile dysfunction    Family history of colon cancer    sister in 34s   Family history of heart disease 2011   baseline cardiac eval 2011 with Dr. Royann Shivers   H/O echocardiogram 2015   Dr. Donnie Aho   H/O echocardiogram 02/2019   History of exercise stress test    04/2014 Dr. Donnie Aho, prior 2011 normal [place] treadmill stress test, Dr. Rennis Golden   History of kidney stones    LOW BACK PAIN, ACUTE 09/26/2009   OSA (obstructive sleep apnea) 01/2022   has cpap   TOBACCO USE, QUIT 09/26/2009   Wears glasses    Past Surgical History:  Procedure Laterality Date   COLONOSCOPY  08/2017   tubular adenoma, repeat 5 years; Dr. Ileene Patrick   FOOT SURGERY     bone spur, right; Dr. Rolland Bimler HERNIA REPAIR Bilateral 04/11/2022   Procedure: LAPAROSCOPIC BILATERAL INGUINAL HERNIA REPAIR with MESH;  Surgeon: Karie Soda, MD;  Location: [facility];  Service: General;  Laterality: Bilateral;  LOCAL   IR NEPHROSTOMY PLACEMENT RIGHT  04/14/2021   NEPHROLITHOTOMY Right 05/17/2021   Procedure: NEPHROLITHOTOMY PERCUTANEOUS insertion double j stent;  Surgeon: Sebastian Ache, MD;  Location: [facility];  Service: Urology;  Laterality: Right;   ROBOT ASSISTED LAPAROSCOPIC NEPHRECTOMY Left 07/26/2021   Procedure: XI ROBOTIC ASSISTED LAPAROSCOPIC  NEPHRECTOMY;  Surgeon: Sebastian Ache, MD;  Location: [facility];  Service: Urology;  Laterality: Left;  3 HRS   UMBILICAL HERNIA REPAIR N/A 04/11/2022   Procedure: HERNIA REPAIR UMBILICAL ADULT;  Surgeon: Karie Soda, MD;  Location: [facility];  Service: General;  Laterality: N/A;  PRIMARY UMBILICAL HERNIA REPAIR   WISDOM TOOTH EXTRACTION     WISDOM TOOTH EXTRACTION     Patient Active Problem List   Diagnosis Date Noted   Nontraumatic complete tear of right rotator cuff 07/22/2022   Impingement syndrome of right shoulder 07/22/2022   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   Benign prostatic hyperplasia with weak urinary stream 02/24/2022   Urinary hesitancy 02/24/2022   COVID-19 vaccine administered 02/24/2022   Screening for lipid disorders 02/24/2022   History of renal cell cancer 02/24/2022   History of renal stone 02/24/2022   Umbilical hernia without obstruction and without gangrene 02/24/2022   Tendinitis of left triceps 01/21/2022   Renal stone 05/17/2021   Sepsis with acute organ dysfunction (HCC) 04/19/2021   Kidney stone 04/19/2021   Renal mass 04/19/2021   Splenomegaly 04/19/2021   Sepsis (HCC) 04/14/2021   Acute lower UTI 04/14/2021   Bilateral hand numbness 02/14/2021   Localized swelling on right hand 02/14/2021   Sleep disturbance 02/14/2021  Snoring 02/14/2021   Erectile dysfunction 02/14/2021   Need for influenza vaccination 02/14/2021   Impingement syndrome of left shoulder 06/20/2020   Primary osteoarthritis of first carpometacarpal joint of left hand 06/20/2020   Encounter for health maintenance examination in adult 02/17/2020   Screening for prostate cancer 02/17/2020   History of COVID-19 02/17/2020   Toenail deformity 02/17/2020   Neuropathy of foot, right 02/17/2020   Chronic pain of both shoulders 04/22/2019   Decreased range of motion of shoulder 04/22/2019   Degenerative arthritis of thumb, left 04/22/2019   Right inguinal hernia 01/28/2019    Aortic valve calcification 01/28/2019   Vaccine counseling 06/22/2017   Family history of heart disease 12/21/2013   Family history of colon cancer 12/21/2013   Nephrolithiasis 09/28/2009    PCP: Jac Canavan, PA-C  REFERRING PROVIDER: Cristie Hem, PA-C  REFERRING DIAG:  Diagnosis  [id number] (ICD-10-CM) - S/P arthroscopy of right shoulder    THERAPY DIAG:  Acute pain of right shoulder  Stiffness of right shoulder, not elsewhere classified  Localized edema  Muscle weakness (generalized)  Rationale for Evaluation and Treatment: Rehabilitation  ONSET DATE: October 09, 2022 surgery  SUBJECTIVE:                                                                                                                                                                                      SUBJECTIVE STATEMENT: He indicated having no specific pain complaints upon arrival.  Reported good report from MD office.   No more sling.   Hand dominance: Right  PERTINENT HISTORY: OA left 1st CMC joint, right foot surgery, significant kidney history  PAIN:  NPRS scale: 0/10 upon arrival.  Pain location: right shoulder Pain description: Achy with occasional sharp, stabbing Aggravating factors: Most right shoulder use Relieving factors: Oxycodone day and night  PRECAUTIONS: Other: Rotator cuff repair 10/09/2022  WEIGHT BEARING RESTRICTIONS: Yes None on right upper extremity  FALLS:  Has patient fallen in last 6 months? 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 Moderate distal infraspinatus tendinosis  with articular and bursal sided fraying. Mild distal subscapularis tendinosis. No significant muscle atrophy.   Mild intra-articular long head biceps tendinosis.   Mild glenohumeral and AC joint osteoarthritis.  PATIENT SURVEYS:  10/24/22: FOTO 32 (Goal 68 in 18 visits)  COGNITION:  10/24/2022 Overall cognitive status: Within functional limits for tasks assessed     SENSATION: 10/24/2022 Kumar has no complaints of peripheral pain or paresthesias related to his right shoulder surgery  POSTURE: 10/24/2022 Mild forward head, internally rotated and protracted shoulders  UPPER EXTREMITY ROM:   Passive ROM Left/Right 10/24/2022 Right 10/30/22 Right 11/17/22  Shoulder flexion 135/90 P: 100 P: 134  Shoulder extension     Shoulder abduction  P: 104 P: 120  Shoulder adduction     Shoulder internal rotation 70/35    Shoulder external rotation 85/10 P: 10 P: 30  (Blank rows = not tested)  UPPER EXTREMITY Strength:  MMT Left/Right 10/24/2022   Shoulder flexion    Shoulder extension    Shoulder abduction    Shoulder adduction    Shoulder internal rotation Deferred   Shoulder external rotation Deferred   (Blank rows = not tested) Strength testing was deferred today secondary to being 15 days status post right rotator cuff repair                     TODAY'S TREATMENT:                                                                                             DATE: 11/19/2022 TherEx Seated pulleys flexion, scaption 2 mins each way with 2-3 sec stretch. Use of Rt arm outstretched with eccentric lowering control from Rt arm Supine Rt shoulder flexion AROM 0 lbs x 10, 1 lb x 10  Rt sidelying Lt shoulder Abduction 0 lb weight 2 x 10 Rt sidelying Lt shoulder ER c towel under arm 2 x 10      Held BFR activity today due to new progression of exercise.  May be appropriate to use next visit in active range movements.   TODAY'S TREATMENT:                                                                                              DATE: 11/17/22 TherEx Pulleys flexion and scaption x 3 min each ROM measurements - see above for details The following exercises performed with BFR Supine AA chest press with overhead flexion 1# bar; 3x15 Supine AA ER on Rt with 1# bar 3x15 Standing deltoid isometrics 15 x 5 sec hold (-way) with 30 sec rest between sets  BFR settings: Cuff size 2 Supine, sitting and standing LOP 150 mmHg (exercised at 75 mmHg)  11/10/22 TherEx Scapular retraction 2x10; 5 sec hold Supine AA chest press 1# bar x10 Supine AA chest press with overhead flexion 1# bar; 2x10 Supine AA ER on Rt with 1# bar 2x10 The following exercises performed with BFR Seated bicep curl x30 reps; 3x15 with 30 sec rest between sets Deltoid  isometrics 10 x 5 sec hold (3-way) with 30 sec rest between sets  Manual PROM to Rt shoulder gentle to tolerance all planes x 8 min; with BFR x 5 min  BFR settings: Cuff size 2 Supine, sitting and standing LOP 140 mmHg (exercised at 70 mmHg)   PATIENT EDUCATION: Education details: HEP update:  Person educated: Patient Education method: Programmer, multimedia, Facilities manager, Verbal/visual  cues, and Handouts Education comprehension: verbalized understanding, returned demonstration, verbal cues required  HOME EXERCISE PROGRAM: Access Code: [email or web address] URL: [email or web address] Date: 11/19/2022 Prepared by: Chyrel Masson  Exercises - Standing Scapular Retraction  - 5 x daily - 7 x weekly - 1 sets - 5 reps - 5 second hold - Supine Shoulder Flexion Extension AAROM with Dowel  - 2-3 x daily - 7 x weekly - 3 sets - 10 reps - Supine Shoulder External Rotation with Dowel  - 2-3 x daily - 7 x weekly - 1 sets - 10 reps - 1-2 sec hold - Supine Shoulder Flexion Extension Full Range AROM  - 1-2 x daily - 7 x weekly - 2-3 sets - 10-20 reps - Sidelying Shoulder Abduction Palm Forward  - 1-2 x daily - 7 x weekly - 2-3 sets - 10-20 reps - Sidelying  Shoulder External Rotation (Mirrored)  - 1-2 x daily - 7 x weekly - 2-3 sets - 10-20 reps - Standing Bilateral Low Shoulder Row with Anchored Resistance  - 1-2 x daily - 7 x weekly - 2-3 sets - 10-15 reps - Shoulder Extension with Resistance  - 1-2 x daily - 7 x weekly - 1-2 sets - 10-15 reps  ASSESSMENT:  CLINICAL IMPRESSION: Update from MD note indicated ability to discontinue sling and initiate strengthening.  Good overall tolerance to gravity reduced active range introduction for Rt shoulder movements. Held BFR activity today due to new progression of exercise.  May be appropriate to use next visit in active range movements.   OBJECTIVE IMPAIRMENTS: decreased activity tolerance, decreased endurance, decreased knowledge of condition, decreased ROM, decreased strength, decreased safety awareness, increased edema, impaired perceived functional ability, increased muscle spasms, impaired UE functional use, postural dysfunction, and pain.   ACTIVITY LIMITATIONS: carrying, lifting, sleeping, toileting, dressing, reach over head, and hygiene/grooming  PARTICIPATION LIMITATIONS: meal prep, cleaning, laundry, driving, shopping, community activity, and occupation  PERSONAL FACTORS: OA left 1st CMC joint, right foot surgery, significant kidney history are also affecting patient's functional outcome.   REHAB POTENTIAL: Good  CLINICAL DECISION MAKING: Stable/uncomplicated  EVALUATION COMPLEXITY: Low   GOALS: Goals reviewed with patient? Yes  SHORT TERM GOALS: Target date: 11/21/2022  [place] will be independent with his day 1 HEP Baseline: Started 10/24/2022 Goal status: MET 11/17/22  2.  Improve right shoulder passive range of motion for flexion to 150 degrees; internal rotation 50 degrees and external rotation to 70 degrees  Goal status: partially met  11/19/22  3.  [place] will be able to sleep at least 4 hours uninterrupted Baseline: 1 to 2 hours Goal status: MET 11/17/22   LONG TERM  GOALS: Target date: 01/02/2023  Improve FOTO to 68 in 18 visits Baseline: 32 Goal status: INITIAL  2.  Improve right should pain to consistently 0-3/10 on the VAS. Baseline: 6-10 out of 10 Goal status: INITIAL  3.  Improve right shoulder AROM for flexion to 170 degrees; ER to 90 degrees; IR to 60 degrees and horizontal adduction to 40 degrees Baseline: See above Goal status: INITIAL  4.  Improve right shoulder strength for ER  to at least 15 pounds and IR to at least 30 pounds.  60% strength right vs left is expected 12 weeks post-surgery with 90% or better strength 9-12 months post-surgery. Baseline: Deferred secondary to surgery 15 days ago at evaluation Goal status: INITIAL  5.  [place]um will be independent with his long-term maintenance HEP at DC Baseline: Started 10/24/2022 Goal status: INITIAL   PLAN:  PT FREQUENCY: 1-2x/week  PT DURATION: 10 weeks  PLANNED INTERVENTIONS: Therapeutic exercises, Therapeutic activity, Neuromuscular re-education, Patient/Family education, Self Care, Joint mobilization, Cryotherapy, Vasopneumatic device, and Manual therapy  PLAN FOR NEXT SESSION: Per MD note, intro active range and early strengthening to tolerance.  Avoid shrug in activity. Check active range measurements Rt shoulder.    Chyrel Masson, PT, DPT, OCS, ATC 11/19/22  10:08 AM

## 2022-11-20 ENCOUNTER — Encounter: Payer: BC Managed Care – PPO | Admitting: Orthopaedic Surgery

## 2022-11-24 ENCOUNTER — Ambulatory Visit (INDEPENDENT_AMBULATORY_CARE_PROVIDER_SITE_OTHER): Payer: BC Managed Care – PPO | Admitting: Physical Therapy

## 2022-11-24 ENCOUNTER — Encounter: Payer: Self-pay | Admitting: Physical Therapy

## 2022-11-24 DIAGNOSIS — R600 Localized edema: Secondary | ICD-10-CM | POA: Diagnosis not present

## 2022-11-24 DIAGNOSIS — M6281 Muscle weakness (generalized): Secondary | ICD-10-CM | POA: Diagnosis not present

## 2022-11-24 DIAGNOSIS — M25611 Stiffness of right shoulder, not elsewhere classified: Secondary | ICD-10-CM | POA: Diagnosis not present

## 2022-11-24 DIAGNOSIS — M25511 Pain in right shoulder: Secondary | ICD-10-CM | POA: Diagnosis not present

## 2022-11-24 NOTE — Therapy (Signed)
OUTPATIENT PHYSICAL THERAPY TREATMENT   Patient Name: Mitchell Hughes MRN: [id number] DOB:1966/02/26, 57 y.o., male Today's Date: 11/24/2022  END OF SESSION:  PT End of Session - 11/24/22 0929     Visit Number 7    Number of Visits 18    Date for PT Re-Evaluation 01/02/23    Authorization Type [employer] 20% coinsurance    Authorization - Number of Visits 60    Progress Note Due on Visit 18    PT Start Time 0930    PT Stop Time 1010    PT Time Calculation (min) 40 min    Activity Tolerance Patient tolerated treatment well    Behavior During Therapy [facility] for tasks assessed/performed                  Past Medical History:  Diagnosis Date   Arthritis    Erectile dysfunction    Family history of colon cancer    sister in 76s   Family history of heart disease 2011   baseline cardiac eval 2011 with Dr. Royann Shivers   H/O echocardiogram 2015   Dr. Donnie Aho   H/O echocardiogram 02/2019   History of exercise stress test    04/2014 Dr. Donnie Aho, prior 2011 normal Bruce treadmill stress test, Dr. Rennis Golden   History of kidney stones    LOW BACK PAIN, ACUTE 09/26/2009   OSA (obstructive sleep apnea) 01/2022   has cpap   TOBACCO USE, QUIT 09/26/2009   Wears glasses    Past Surgical History:  Procedure Laterality Date   COLONOSCOPY  08/2017   tubular adenoma, repeat 5 years; Dr. Ileene Patrick   FOOT SURGERY     bone spur, right; Dr. Rolland Bimler HERNIA REPAIR Bilateral 04/11/2022   Procedure: LAPAROSCOPIC BILATERAL INGUINAL HERNIA REPAIR with MESH;  Surgeon: Karie Soda, MD;  Location: [facility];  Service: General;  Laterality: Bilateral;  LOCAL   IR NEPHROSTOMY PLACEMENT RIGHT  04/14/2021   NEPHROLITHOTOMY Right 05/17/2021   Procedure: NEPHROLITHOTOMY PERCUTANEOUS insertion double j stent;  Surgeon: Sebastian Ache, MD;  Location: [facility];  Service: Urology;  Laterality: Right;   ROBOT ASSISTED LAPAROSCOPIC NEPHRECTOMY Left 07/26/2021   Procedure: XI ROBOTIC ASSISTED LAPAROSCOPIC  NEPHRECTOMY;  Surgeon: Sebastian Ache, MD;  Location: [facility];  Service: Urology;  Laterality: Left;  3 HRS   UMBILICAL HERNIA REPAIR N/A 04/11/2022   Procedure: HERNIA REPAIR UMBILICAL ADULT;  Surgeon: Karie Soda, MD;  Location: [facility];  Service: General;  Laterality: N/A;  PRIMARY UMBILICAL HERNIA REPAIR   WISDOM TOOTH EXTRACTION     WISDOM TOOTH EXTRACTION     Patient Active Problem List   Diagnosis Date Noted   Nontraumatic complete tear of right rotator cuff 07/22/2022   Impingement syndrome of right shoulder 07/22/2022   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   Benign prostatic hyperplasia with weak urinary stream 02/24/2022   Urinary hesitancy 02/24/2022   COVID-19 vaccine administered 02/24/2022   Screening for lipid disorders 02/24/2022   History of renal cell cancer 02/24/2022   History of renal stone 02/24/2022   Umbilical hernia without obstruction and without gangrene 02/24/2022   Tendinitis of left triceps 01/21/2022   Renal stone 05/17/2021   Sepsis with acute organ dysfunction (HCC) 04/19/2021   Kidney stone 04/19/2021   Renal mass 04/19/2021   Splenomegaly 04/19/2021   Sepsis (HCC) 04/14/2021   Acute lower UTI 04/14/2021   Bilateral hand numbness 02/14/2021   Localized swelling on right hand 02/14/2021   Sleep disturbance  02/14/2021   Snoring 02/14/2021   Erectile dysfunction 02/14/2021   Need for influenza vaccination 02/14/2021   Impingement syndrome of left shoulder 06/20/2020   Primary osteoarthritis of first carpometacarpal joint of left hand 06/20/2020   Encounter for health maintenance examination in adult 02/17/2020   Screening for prostate cancer 02/17/2020   History of COVID-19 02/17/2020   Toenail deformity 02/17/2020   Neuropathy of foot, right 02/17/2020   Chronic pain of both shoulders 04/22/2019   Decreased range of motion of shoulder 04/22/2019   Degenerative arthritis of thumb, left 04/22/2019   Right inguinal hernia 01/28/2019    Aortic valve calcification 01/28/2019   Vaccine counseling 06/22/2017   Family history of heart disease 12/21/2013   Family history of colon cancer 12/21/2013   Nephrolithiasis 09/28/2009    PCP: Jac Canavan, PA-C  REFERRING PROVIDER: Cristie Hem, PA-C  REFERRING DIAG:  Diagnosis  [phone] (ICD-10-CM) - S/P arthroscopy of right shoulder    THERAPY DIAG:  Acute pain of right shoulder  Stiffness of right shoulder, not elsewhere classified  Localized edema  Muscle weakness (generalized)  Rationale for Evaluation and Treatment: Rehabilitation  ONSET DATE: October 09, 2022 surgery  SUBJECTIVE:                                                                                                                                                                                      SUBJECTIVE STATEMENT: Pt states he has a little soreness after sessions but not having much pain. Not using sling at all.    Hand dominance: Right  PERTINENT HISTORY: OA left 1st CMC joint, right foot surgery, significant kidney history  PAIN:  NPRS scale: 0/10 upon arrival.  Pain location: right shoulder Pain description: Achy with occasional sharp, stabbing Aggravating factors: Most right shoulder use Relieving factors: Oxycodone day and night  PRECAUTIONS: Other: Rotator cuff repair 10/09/2022  WEIGHT BEARING RESTRICTIONS: Yes None on right upper extremity  FALLS:  Has patient fallen in last 6 months? 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 Moderate distal infraspinatus tendinosis with articular and bursal sided fraying. Mild distal subscapularis tendinosis. No significant muscle atrophy.   Mild intra-articular long head biceps tendinosis.   Mild glenohumeral and AC joint osteoarthritis.  PATIENT SURVEYS:  10/24/22: FOTO 32 (Goal 68 in 18 visits)  COGNITION: 10/24/2022 Overall cognitive status: Within functional limits for tasks assessed     SENSATION: 10/24/2022 Ashrith has no complaints of peripheral pain or paresthesias related to his right shoulder surgery  POSTURE: 10/24/2022 Mild forward head, internally rotated and protracted shoulders  UPPER EXTREMITY ROM:   Passive ROM Left/Right 10/24/2022 Right 10/30/22 Right 11/17/22 Right 11/24/22  Shoulder flexion 135/90 P: 100 P: 134 A: 106 deg, no pain, noted shrug  Shoulder extension      Shoulder abduction  P: 104 P: 120 A: 89 deg no pain, shrug and trunk lean noted   Shoulder adduction      Shoulder internal rotation 70/35     Shoulder external rotation 85/10 P: 10 P: 30   (Blank rows = not tested)  UPPER EXTREMITY Strength:  MMT Left/Right 10/24/2022   Shoulder flexion    Shoulder extension    Shoulder abduction    Shoulder adduction    Shoulder internal rotation Deferred   Shoulder external rotation Deferred   (Blank rows = not tested) Strength testing was deferred today secondary to being 15 days status post right rotator cuff repair                     TODAY'S TREATMENT:                                                                                              [facility] Adult PT Treatment:                                                DATE: 11/24/22 Therapeutic Exercise: Seated pulleys flexion/scaption 2 min each way  Gravity reduced shoulder flexion AROM (incline on table) 2x8 cues for reduced UT elevation Incline chest press with PVC pipe x8 Supine chest press with PVC pipe 2x15 Red band row x12, green band row x12 cues for elbow  positioning and scapular mechanics Shoulder extension red band x10 cues for setup, green band x10 Sidelying ER 2x15 with towel prop and cues for positioning   DATE: 11/19/2022 TherEx Seated pulleys flexion, scaption 2 mins each way with 2-3 sec stretch. Use of Rt arm outstretched with eccentric lowering control from Rt arm Supine Rt shoulder flexion AROM 0 lbs x 10, 1 lb x 10  Rt sidelying Lt shoulder Abduction 0 lb weight 2 x 10 Rt sidelying Lt shoulder ER c towel under arm 2 x 10      Held BFR activity today due to new progression of exercise.  May be appropriate to use next visit in active range movements.   TODAY'S TREATMENT:  DATE: 11/17/22 TherEx Pulleys flexion and scaption x 3 min each ROM measurements - see above for details The following exercises performed with BFR Supine AA chest press with overhead flexion 1# bar; 3x15 Supine AA ER on Rt with 1# bar 3x15 Standing deltoid isometrics 15 x 5 sec hold (-way) with 30 sec rest between sets  BFR settings: Cuff size 2 Supine, sitting and standing LOP 150 mmHg (exercised at 75 mmHg)  11/10/22 TherEx Scapular retraction 2x10; 5 sec hold Supine AA chest press 1# bar x10 Supine AA chest press with overhead flexion 1# bar; 2x10 Supine AA ER on Rt with 1# bar 2x10 The following exercises performed with BFR Seated bicep curl x30 reps; 3x15 with 30 sec rest between sets Deltoid isometrics 10 x 5 sec hold (3-way) with 30 sec rest between sets  Manual PROM to Rt shoulder gentle to tolerance all planes x 8 min; with BFR x 5 min  BFR settings: Cuff size 2 Supine, sitting and standing LOP 140 mmHg (exercised at 70 mmHg)   PATIENT EDUCATION: Education details: HEP update:  Person educated: Patient Education method: Programmer, multimedia, Facilities manager, Verbal/visual  cues, and Handouts Education comprehension: verbalized understanding, returned  demonstration, verbal cues required  HOME EXERCISE PROGRAM: Access Code: [email or web address] URL: [email or web address] Date: 11/19/2022 Prepared by: Chyrel Masson  Exercises - Standing Scapular Retraction  - 5 x daily - 7 x weekly - 1 sets - 5 reps - 5 second hold - Supine Shoulder Flexion Extension AAROM with Dowel  - 2-3 x daily - 7 x weekly - 3 sets - 10 reps - Supine Shoulder External Rotation with Dowel  - 2-3 x daily - 7 x weekly - 1 sets - 10 reps - 1-2 sec hold - Supine Shoulder Flexion Extension Full Range AROM  - 1-2 x daily - 7 x weekly - 2-3 sets - 10-20 reps - Sidelying Shoulder Abduction Palm Forward  - 1-2 x daily - 7 x weekly - 2-3 sets - 10-20 reps - Sidelying Shoulder External Rotation (Mirrored)  - 1-2 x daily - 7 x weekly - 2-3 sets - 10-20 reps - Standing Bilateral Low Shoulder Row with Anchored Resistance  - 1-2 x daily - 7 x weekly - 2-3 sets - 10-15 reps - Shoulder Extension with Resistance  - 1-2 x daily - 7 x weekly - 1-2 sets - 10-15 reps  ASSESSMENT:  CLINICAL IMPRESSION: Pt arrives w/o pain, continues to endorse some soreness. Today looking at AROM as above, tolerates well. Continuing with AAROM and AROM in gravity reduced positions, no adverse events. Education on HEP and safe progression of familiar activities, pt verbalizes good understanding. Departs without pain, endorses typical soreness. Recommend continuing along current POC in order to address relevant deficits and improve functional tolerance. Pt departs today's session in no acute distress, all voiced questions/concerns addressed appropriately from PT perspective.    OBJECTIVE IMPAIRMENTS: decreased activity tolerance, decreased endurance, decreased knowledge of condition, decreased ROM, decreased strength, decreased safety awareness, increased edema, impaired perceived functional ability, increased muscle spasms, impaired UE functional use, postural dysfunction, and pain.   ACTIVITY LIMITATIONS:  carrying, lifting, sleeping, toileting, dressing, reach over head, and hygiene/grooming  PARTICIPATION LIMITATIONS: meal prep, cleaning, laundry, driving, shopping, community activity, and occupation  PERSONAL FACTORS: OA left 1st CMC joint, right foot surgery, significant kidney history are also affecting patient's functional outcome.   REHAB POTENTIAL: Good  CLINICAL DECISION MAKING: Stable/uncomplicated  EVALUATION COMPLEXITY: Low   GOALS: Goals reviewed with patient? Yes  SHORT TERM GOALS: Target date: 11/21/2022  Evander will be independent with his day 1 HEP Baseline: Started 10/24/2022 Goal status: MET 11/17/22  2.  Improve right shoulder passive range of motion for flexion to 150 degrees; internal rotation 50 degrees and external rotation to 70 degrees  Goal status: partially met  11/19/22  3.  Macoy will be able to sleep at least 4 hours uninterrupted Baseline: 1 to 2 hours Goal status: MET 11/17/22   LONG TERM GOALS: Target date: 01/02/2023  Improve FOTO to 68 in 18 visits Baseline: 32 Goal status: INITIAL  2.  Improve right should pain to consistently 0-3/10 on the VAS. Baseline: 6-10 out of 10 Goal status: INITIAL  3.  Improve right shoulder AROM for flexion to 170 degrees; ER to 90 degrees; IR to 60 degrees and horizontal adduction to 40 degrees Baseline: See above Goal status: INITIAL  4.  Improve right shoulder strength for ER to at least 15 pounds and IR to at least 30 pounds.  60% strength right vs left is expected 12 weeks post-surgery with 90% or better strength 9-12 months post-surgery. Baseline: Deferred secondary to surgery 15 days ago at evaluation Goal status: INITIAL  5.  Freman will be independent with his long-term maintenance HEP at DC Baseline: Started 10/24/2022 Goal status: INITIAL   PLAN:  PT FREQUENCY: 1-2x/week  PT DURATION: 10 weeks  PLANNED INTERVENTIONS: Therapeutic exercises, Therapeutic activity, Neuromuscular re-education,  Patient/Family education, Self Care, Joint mobilization, Cryotherapy, Vasopneumatic device, and Manual therapy  PLAN FOR NEXT SESSION: early strengthening and AROM. Avoid compensatory movements with elevation.    Ashley Murrain PT, DPT 11/24/2022 10:11 AM

## 2022-12-02 DIAGNOSIS — G4733 Obstructive sleep apnea (adult) (pediatric): Secondary | ICD-10-CM | POA: Diagnosis not present

## 2022-12-08 ENCOUNTER — Encounter: Payer: BC Managed Care – PPO | Admitting: Rehabilitative and Restorative Service Providers"

## 2022-12-10 ENCOUNTER — Encounter: Payer: Self-pay | Admitting: Rehabilitative and Restorative Service Providers"

## 2022-12-10 ENCOUNTER — Ambulatory Visit (INDEPENDENT_AMBULATORY_CARE_PROVIDER_SITE_OTHER): Payer: BC Managed Care – PPO | Admitting: Rehabilitative and Restorative Service Providers"

## 2022-12-10 DIAGNOSIS — M25611 Stiffness of right shoulder, not elsewhere classified: Secondary | ICD-10-CM

## 2022-12-10 DIAGNOSIS — M6281 Muscle weakness (generalized): Secondary | ICD-10-CM | POA: Diagnosis not present

## 2022-12-10 DIAGNOSIS — R600 Localized edema: Secondary | ICD-10-CM | POA: Diagnosis not present

## 2022-12-10 DIAGNOSIS — M25511 Pain in right shoulder: Secondary | ICD-10-CM

## 2022-12-10 NOTE — Therapy (Signed)
OUTPATIENT PHYSICAL THERAPY TREATMENT   Patient Name: Mitchell Hughes MRN: [id number] DOB:[birth year], 57 y.o., male Today's Date: 12/10/2022  END OF SESSION:  PT End of Session - 12/10/22 0809     Visit Number 8    Number of Visits 18    Date for PT Re-Evaluation 01/02/23    Authorization Type [employer] 20% coinsurance    Authorization - Number of Visits 60    Progress Note Due on Visit 18    PT Start Time 0800    PT Stop Time 0839    PT Time Calculation (min) 39 min    Activity Tolerance Patient tolerated treatment well    Behavior During Therapy [facility] for tasks assessed/performed                   Past Medical History:  Diagnosis Date   Arthritis    Erectile dysfunction    Family history of colon cancer    sister in 52s   Family history of heart disease 2011   baseline cardiac eval 2011 with Dr. Royann Shivers   H/O echocardiogram 2015   Dr. Donnie Aho   H/O echocardiogram 02/2019   History of exercise stress test    04/2014 Dr. Donnie Aho, prior 2011 normal Bruce treadmill stress test, Dr. Rennis Golden   History of kidney stones    LOW BACK PAIN, ACUTE 09/26/2009   OSA (obstructive sleep apnea) 01/2022   has cpap   TOBACCO USE, QUIT 09/26/2009   Wears glasses    Past Surgical History:  Procedure Laterality Date   COLONOSCOPY  08/2017   tubular adenoma, repeat 5 years; Dr. Ileene Patrick   FOOT SURGERY     bone spur, right; Dr. Rolland Bimler HERNIA REPAIR Bilateral 04/11/2022   Procedure: LAPAROSCOPIC BILATERAL INGUINAL HERNIA REPAIR with MESH;  Surgeon: Karie Soda, MD;  Location: [facility];  Service: General;  Laterality: Bilateral;  LOCAL   IR NEPHROSTOMY PLACEMENT RIGHT  04/14/2021   NEPHROLITHOTOMY Right 05/17/2021   Procedure: NEPHROLITHOTOMY PERCUTANEOUS insertion double j stent;  Surgeon: Sebastian Ache, MD;  Location: [facility];  Service: Urology;  Laterality: Right;   ROBOT ASSISTED LAPAROSCOPIC NEPHRECTOMY Left 07/26/2021   Procedure: XI ROBOTIC ASSISTED LAPAROSCOPIC  NEPHRECTOMY;  Surgeon: Sebastian Ache, MD;  Location: [facility];  Service: Urology;  Laterality: Left;  3 HRS   UMBILICAL HERNIA REPAIR N/A 04/11/2022   Procedure: HERNIA REPAIR UMBILICAL ADULT;  Surgeon: Karie Soda, MD;  Location: [facility];  Service: General;  Laterality: N/A;  PRIMARY UMBILICAL HERNIA REPAIR   WISDOM TOOTH EXTRACTION     WISDOM TOOTH EXTRACTION     Patient Active Problem List   Diagnosis Date Noted   Nontraumatic complete tear of right rotator cuff 07/22/2022   Impingement syndrome of right shoulder 07/22/2022   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   Benign prostatic hyperplasia with weak urinary stream 02/24/2022   Urinary hesitancy 02/24/2022   COVID-19 vaccine administered 02/24/2022   Screening for lipid disorders 02/24/2022   History of renal cell cancer 02/24/2022   History of renal stone 02/24/2022   Umbilical hernia without obstruction and without gangrene 02/24/2022   Tendinitis of left triceps 01/21/2022   Renal stone 05/17/2021   Sepsis with acute organ dysfunction (HCC) 04/19/2021   Kidney stone 04/19/2021   Renal mass 04/19/2021   Splenomegaly 04/19/2021   Sepsis (HCC) 04/14/2021   Acute lower UTI 04/14/2021   Bilateral hand numbness 02/14/2021   Localized swelling on right hand 02/14/2021   Sleep  disturbance 02/14/2021   Snoring 02/14/2021   Erectile dysfunction 02/14/2021   Need for influenza vaccination 02/14/2021   Impingement syndrome of left shoulder 06/20/2020   Primary osteoarthritis of first carpometacarpal joint of left hand 06/20/2020   Encounter for health maintenance examination in adult 02/17/2020   Screening for prostate cancer 02/17/2020   History of COVID-19 02/17/2020   Toenail deformity 02/17/2020   Neuropathy of foot, right 02/17/2020   Chronic pain of both shoulders 04/22/2019   Decreased range of motion of shoulder 04/22/2019   Degenerative arthritis of thumb, left 04/22/2019   Right inguinal hernia 01/28/2019    Aortic valve calcification 01/28/2019   Vaccine counseling 06/22/2017   Family history of heart disease 12/21/2013   Family history of colon cancer 12/21/2013   Nephrolithiasis 09/28/2009    PCP: Jac Canavan, PA-C  REFERRING PROVIDER: Cristie Hem, PA-C  REFERRING DIAG:  Diagnosis  [phone] (ICD-10-CM) - S/P arthroscopy of right shoulder    THERAPY DIAG:  Acute pain of right shoulder  Stiffness of right shoulder, not elsewhere classified  Localized edema  Muscle weakness (generalized)  Rationale for Evaluation and Treatment: Rehabilitation  ONSET DATE: October 09, 2022 surgery  SUBJECTIVE:                                                                                                                                                                                      SUBJECTIVE STATEMENT: Pt indicated no specific pain complaints since last visit.  Reported some soreness here and there and some things noted at night.   Pt indicated overall improvement 70% at this time.  Pt indicated still having some restriction in full overhead motion.   Hand dominance: Right  PERTINENT HISTORY: OA left 1st CMC joint, right foot surgery, significant kidney history  PAIN:  NPRS scale: 0/10 upon arrival.  Pain location: right shoulder Pain description: Achy with occasional sharp, stabbing Aggravating factors: Most right shoulder use Relieving factors: Oxycodone day and night  PRECAUTIONS: Other: Rotator cuff repair 10/09/2022  WEIGHT BEARING RESTRICTIONS: Yes None on right upper extremity  FALLS:  Has patient fallen in last 6 months? 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 Moderate distal infraspinatus tendinosis with articular and bursal sided fraying. Mild distal subscapularis  tendinosis. No significant muscle atrophy.   Mild intra-articular long head biceps tendinosis.   Mild glenohumeral and AC joint osteoarthritis.  PATIENT SURVEYS:  12/10/2022:  FOTO update:  61  10/24/22: FOTO 32 (Goal 68 in 18 visits)  COGNITION: 10/24/2022 Overall cognitive status: Within functional limits for tasks assessed     SENSATION: 10/24/2022 Colton has no complaints of peripheral pain or paresthesias related to his right shoulder surgery  POSTURE: 10/24/2022 Mild forward head, internally rotated and protracted shoulders  UPPER EXTREMITY ROM:   Passive ROM Left/Right 10/24/2022 Right 10/30/22 Right 11/17/22 Right 11/24/22 Right 12/10/2022  Shoulder flexion 135/90 P: 100 P: 134 A: 106 deg, no pain, noted shrug AROM in supine 132  Shoulder extension       Shoulder abduction  P: 104 P: 120 A: 89 deg no pain, shrug and trunk lean noted  AROM in supine 108  Shoulder adduction       Shoulder internal rotation 70/35    60 AROM in supine abduction 45 deg  Shoulder external rotation 85/10 P: 10 P: 30  40 AROM in supine abduction 45 deg  (Blank rows = not tested)  UPPER EXTREMITY Strength:  MMT Left/Right 10/24/2022 Right 12/10/2022 Left 12/10/2022  Shoulder flexion  4/5 5/5  Shoulder extension     Shoulder abduction  3+/5 5/5  Shoulder adduction     Shoulder internal rotation Deferred 4+/5 5/5  Shoulder external rotation Deferred 4/5 5/5  (Blank rows = not tested) Strength testing was deferred today secondary to being 15 days status post right rotator cuff repair                   TODAY'S TREATMENT:                                                                 DATE: 12/10/2022 TherEx UBE Fwd/back 3 mins  each way lvl 3.0  Standing shoulder flexion to 90 deg (avoiding shrug) bilateral 1 lb 2 x 15  Standing shoulder abduction to 90 deg (avoiding shrug) bilateral 1 lb 2 x 10   Manual Supine Rt shoulder inferior glides in flexion, scaption, abduction g4, Mobilization c movement posterior Rt GH glide with passive ER mobility       TODAY'S TREATMENT:                                                                 DATE: 11/24/22 Therapeutic Exercise: Seated pulleys flexion/scaption 2 min each way  Gravity reduced shoulder flexion AROM (incline on table) 2x8 cues for reduced UT elevation Incline chest press with PVC pipe x8 Supine chest press with PVC pipe 2x15 Red band row x12, green band row x12 cues for elbow positioning and scapular mechanics Shoulder extension red band x10 cues for setup, green band x10 Sidelying ER 2x15 with towel prop and cues for positioning   TODAY'S TREATMENT:  DATE: 11/19/2022 TherEx Seated pulleys flexion, scaption 2 mins each way with 2-3 sec stretch. Use of Rt arm outstretched with eccentric lowering control from Rt arm Supine Rt shoulder flexion AROM 0 lbs x 10, 1 lb x 10  Rt sidelying Lt shoulder Abduction 0 lb weight 2 x 10 Rt sidelying Lt shoulder ER c towel under arm 2 x 10   Held BFR activity today due to new progression of exercise.  May be appropriate to use next visit in active range movements.   TODAY'S TREATMENT:                                                                                             DATE: 11/17/22 TherEx Pulleys flexion and scaption x 3 min each ROM measurements - see above for details The following exercises performed with BFR Supine AA chest press with overhead flexion 1# bar; 3x15 Supine AA ER on Rt with 1# bar 3x15 Standing deltoid isometrics 15 x 5 sec hold (-way) with 30 sec rest between sets  BFR settings: Cuff size 2 Supine, sitting and standing LOP 150  mmHg (exercised at 75 mmHg)    PATIENT EDUCATION: 12/10/2022 Education details: HEP update:  Person educated: Patient Education method: Programmer, multimedia, Facilities manager, Verbal/visual  cues, and Handouts Education comprehension: verbalized understanding, returned demonstration, verbal cues required  HOME EXERCISE PROGRAM: Access Code: [email or web address] URL: [email or web address] Date: 12/10/2022 Prepared by: Chyrel Masson  Exercises - Standing Scapular Retraction  - 5 x daily - 7 x weekly - 1 sets - 5 reps - 5 second hold - Single Arm Doorway Pec Stretch at 90 Degrees Abduction  - 2-3 x daily - 7 x weekly - 1 sets - 3-5 reps - 15-30 hold - Standing shoulder flexion wall slides  - 2-3 x daily - 7 x weekly - 1 sets - 10 reps - 5 hold - Supine Shoulder Flexion Extension Full Range AROM  - 1-2 x daily - 7 x weekly - 2-3 sets - 10-20 reps - Sidelying Shoulder Abduction Palm Forward  - 1 x daily - 7 x weekly - 2-3 sets - 10-20 reps - Sidelying Shoulder External Rotation (Mirrored)  - 1 x daily - 7 x weekly - 2-3 sets - 10-20 reps - Standing Bilateral Low Shoulder Row with Anchored Resistance  - 1-2 x daily - 7 x weekly - 2-3 sets - 10-15 reps - Shoulder Extension with Resistance  - 1-2 x daily - 7 x weekly - 1-2 sets - 10-15 reps - Standing Shoulder Flexion to 90 Degrees with Dumbbells  - 1 x daily - 7 x weekly - 2-3 sets - 10-15 reps - Shoulder Abduction with Dumbbells - Thumbs Up  - 1 x daily - 7 x weekly - 2-3 sets - 10-15 reps  ASSESSMENT:  CLINICAL IMPRESSION: Check of ROM showed improvement but still having limitations in all directions that would continue to benefit from in clinic manual intervention paired with therex/HEP for mobility gains.  Strength assessment showed fair to good performance but deficits compared to Lt noted.  Plan to continue to progress strength improvements.     OBJECTIVE  IMPAIRMENTS: decreased activity tolerance, decreased endurance, decreased knowledge of  condition, decreased ROM, decreased strength, decreased safety awareness, increased edema, impaired perceived functional ability, increased muscle spasms, impaired UE functional use, postural dysfunction, and pain.   ACTIVITY LIMITATIONS: carrying, lifting, sleeping, toileting, dressing, reach over head, and hygiene/grooming  PARTICIPATION LIMITATIONS: meal prep, cleaning, laundry, driving, shopping, community activity, and occupation  PERSONAL FACTORS: OA left 1st CMC joint, right foot surgery, significant kidney history are also affecting patient's functional outcome.   REHAB POTENTIAL: Good  CLINICAL DECISION MAKING: Stable/uncomplicated  EVALUATION COMPLEXITY: Low   GOALS: Goals reviewed with patient? Yes  SHORT TERM GOALS: Target date: 11/21/2022  Sang will be independent with his day 1 HEP Baseline: Started 10/24/2022 Goal status: MET 11/17/22  2.  Improve right shoulder passive range of motion for flexion to 150 degrees; internal rotation 50 degrees and external rotation to 70 degrees  Goal status: partially met  11/19/22  3.  Terren will be able to sleep at least 4 hours uninterrupted Baseline: 1 to 2 hours Goal status: MET 11/17/22   LONG TERM GOALS: Target date: 01/02/2023  Improve FOTO to 68 in 18 visits Baseline: 32 Goal status: on going 12/10/2022  2.  Improve right should pain to consistently 0-3/10 on the VAS. Baseline: 6-10 out of 10 Goal status on going 12/10/2022  3.  Improve right shoulder AROM for flexion to 170 degrees; ER to 90 degrees; IR to 60 degrees and horizontal adduction to 40 degrees Baseline: See above Goal status: on going 12/10/2022  4.  Improve right shoulder strength for ER to at least 15 pounds and IR to at least 30 pounds.  60% strength right vs left is expected 12 weeks post-surgery with 90% or better strength 9-12 months post-surgery. Baseline: Deferred secondary to surgery 15 days ago at evaluation Goal status: on going 12/10/2022  5.   Hykeem will be independent with his long-term maintenance HEP at DC Baseline: Started 10/24/2022 Goal status: on going 12/10/2022   PLAN:  PT FREQUENCY: 1-2x/week  PT DURATION: 10 weeks  PLANNED INTERVENTIONS: Therapeutic exercises, Therapeutic activity, Neuromuscular re-education, Patient/Family education, Self Care, Joint mobilization, Cryotherapy, Vasopneumatic device, and Manual therapy  PLAN FOR NEXT SESSION: Avoid shrug in strengthening, can still use BFR.  Manual for mobility progression.    Chyrel Masson, PT, DPT, OCS, ATC 12/10/22  8:38 AM

## 2022-12-11 NOTE — Therapy (Signed)
OUTPATIENT PHYSICAL THERAPY TREATMENT   Patient Name: Mitchell Hughes MRN: [id number] DOB:05-02-1966, 57 y.o., male Today's Date: 12/12/2022  END OF SESSION:  PT End of Session - 12/12/22 0756     Visit Number 9    Number of Visits 18    Date for PT Re-Evaluation 01/02/23    Authorization Type [employer] 20% coinsurance    Authorization - Number of Visits 60    Progress Note Due on Visit 18    PT Start Time 0756    PT Stop Time 0835    PT Time Calculation (min) 39 min    Activity Tolerance Patient tolerated treatment well    Behavior During Therapy [facility] for tasks assessed/performed              Past Medical History:  Diagnosis Date   Arthritis    Erectile dysfunction    Family history of colon cancer    sister in 2s   Family history of heart disease 2011   baseline cardiac eval 2011 with Dr. Royann Shivers   H/O echocardiogram 2015   Dr. Donnie Aho   H/O echocardiogram 02/2019   History of exercise stress test    04/2014 Dr. Donnie Aho, prior 2011 normal Bruce treadmill stress test, Dr. Rennis Golden   History of kidney stones    LOW BACK PAIN, ACUTE 09/26/2009   OSA (obstructive sleep apnea) 01/2022   has cpap   TOBACCO USE, QUIT 09/26/2009   Wears glasses    Past Surgical History:  Procedure Laterality Date   COLONOSCOPY  08/2017   tubular adenoma, repeat 5 years; Dr. Ileene Patrick   FOOT SURGERY     bone spur, right; Dr. Rolland Bimler HERNIA REPAIR Bilateral 04/11/2022   Procedure: LAPAROSCOPIC BILATERAL INGUINAL HERNIA REPAIR with MESH;  Surgeon: Karie Soda, MD;  Location: [facility];  Service: General;  Laterality: Bilateral;  LOCAL   IR NEPHROSTOMY PLACEMENT RIGHT  04/14/2021   NEPHROLITHOTOMY Right 05/17/2021   Procedure: NEPHROLITHOTOMY PERCUTANEOUS insertion double j stent;  Surgeon: Sebastian Ache, MD;  Location: [facility];  Service: Urology;  Laterality: Right;   ROBOT ASSISTED LAPAROSCOPIC NEPHRECTOMY Left 07/26/2021   Procedure: XI ROBOTIC ASSISTED LAPAROSCOPIC  NEPHRECTOMY;  Surgeon: Sebastian Ache, MD;  Location: [facility];  Service: Urology;  Laterality: Left;  3 HRS   UMBILICAL HERNIA REPAIR N/A 04/11/2022   Procedure: HERNIA REPAIR UMBILICAL ADULT;  Surgeon: Karie Soda, MD;  Location: [facility];  Service: General;  Laterality: N/A;  PRIMARY UMBILICAL HERNIA REPAIR   WISDOM TOOTH EXTRACTION     WISDOM TOOTH EXTRACTION     Patient Active Problem List   Diagnosis Date Noted   Nontraumatic complete tear of right rotator cuff 07/22/2022   Impingement syndrome of right shoulder 07/22/2022   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   Benign prostatic hyperplasia with weak urinary stream 02/24/2022   Urinary hesitancy 02/24/2022   COVID-19 vaccine administered 02/24/2022   Screening for lipid disorders 02/24/2022   History of renal cell cancer 02/24/2022   History of renal stone 02/24/2022   Umbilical hernia without obstruction and without gangrene 02/24/2022   Tendinitis of left triceps 01/21/2022   Renal stone 05/17/2021   Sepsis with acute organ dysfunction (HCC) 04/19/2021   Kidney stone 04/19/2021   Renal mass 04/19/2021   Splenomegaly 04/19/2021   Sepsis (HCC) 04/14/2021   Acute lower UTI 04/14/2021   Bilateral hand numbness 02/14/2021   Localized swelling on right hand 02/14/2021   Sleep disturbance 02/14/2021   Snoring  02/14/2021   Erectile dysfunction 02/14/2021   Need for influenza vaccination 02/14/2021   Impingement syndrome of left shoulder 06/20/2020   Primary osteoarthritis of first carpometacarpal joint of left hand 06/20/2020   Encounter for health maintenance examination in adult 02/17/2020   Screening for prostate cancer 02/17/2020   History of COVID-19 02/17/2020   Toenail deformity 02/17/2020   Neuropathy of foot, right 02/17/2020   Chronic pain of both shoulders 04/22/2019   Decreased range of motion of shoulder 04/22/2019   Degenerative arthritis of thumb, left 04/22/2019   Right inguinal hernia 01/28/2019    Aortic valve calcification 01/28/2019   Vaccine counseling 06/22/2017   Family history of heart disease 12/21/2013   Family history of colon cancer 12/21/2013   Nephrolithiasis 09/28/2009    PCP: Jac Canavan, PA-C  REFERRING PROVIDER: Cristie Hem, PA-C  REFERRING DIAG:  Diagnosis  [phone] (ICD-10-CM) - S/P arthroscopy of right shoulder    THERAPY DIAG:  Acute pain of right shoulder  Stiffness of right shoulder, not elsewhere classified  Localized edema  Muscle weakness (generalized)  Rationale for Evaluation and Treatment: Rehabilitation  ONSET DATE: October 09, 2022 surgery  SUBJECTIVE:                                                                                                                                                                                      SUBJECTIVE STATEMENT: Pt indicated no specific pain upon arrival.  Reported some spasms in back of shoulder when waking this morning.   Hand dominance: Right  PERTINENT HISTORY: OA left 1st CMC joint, right foot surgery, significant kidney history  PAIN:  NPRS scale: 0/10 upon arrival.  Pain location: right shoulder Pain description: Achy with occasional sharp, stabbing Aggravating factors: Most right shoulder use Relieving factors: Oxycodone day and night  PRECAUTIONS: Other: Rotator cuff repair 10/09/2022  WEIGHT BEARING RESTRICTIONS: Yes None on right upper extremity  FALLS:  Has patient fallen in last 6 months? 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 Moderate distal infraspinatus tendinosis with articular and bursal sided fraying. Mild distal subscapularis tendinosis. No significant muscle atrophy.   Mild intra-articular long head biceps tendinosis.   Mild glenohumeral and AC joint osteoarthritis.  PATIENT SURVEYS:  12/10/2022:  FOTO update:  61  10/24/22: FOTO 32 (Goal 68 in 18 visits)  COGNITION: 10/24/2022 Overall cognitive status: Within functional limits for tasks assessed     SENSATION: 10/24/2022 Elyjah has no complaints of peripheral pain or paresthesias related to his right shoulder surgery  POSTURE: 10/24/2022 Mild forward head, internally rotated and protracted shoulders  UPPER EXTREMITY ROM:   Passive ROM Left/Right 10/24/2022 Right 10/30/22 Right 11/17/22 Right 11/24/22 Right 12/10/2022  Shoulder flexion 135/90 P: 100 P: 134 A: 106 deg, no pain, noted shrug AROM in supine 132  Shoulder extension       Shoulder abduction  P: 104 P: 120 A: 89 deg no pain, shrug and trunk lean noted  AROM in supine 108  Shoulder adduction       Shoulder internal rotation 70/35    60 AROM in supine abduction 45 deg  Shoulder external rotation 85/10 P: 10 P: 30  40 AROM in supine abduction 45 deg  (Blank rows = not tested)  UPPER EXTREMITY Strength:  MMT Left/Right 10/24/2022 Right 12/10/2022 Left 12/10/2022  Shoulder flexion  4/5 5/5  Shoulder extension     Shoulder abduction  3+/5 5/5  Shoulder adduction     Shoulder internal rotation Deferred 4+/5 5/5  Shoulder external rotation Deferred 4/5 5/5  (Blank rows = not tested) Strength testing was deferred today secondary to being 15 days status post right rotator cuff repair   BFR settings: Cuff size 2 Supine, sitting and standing LOP 150 mmHg (exercised at 75 mmHg)                   TODAY'S TREATMENT:                                                                 DATE: 12/12/2022 TherEx Pulley flexion, scaption with outstretched arm with eccentric  lowering focus 3 mins each way   With BFR: Sidelying Rt shoulder abduction 1 lb 3 x 15 Sidelying Rt shoulder ER c arm at side 3 x 15 2 lb  Standing shoulder flexion to 90 deg (avoiding shrug) bilateral 3 x 15  Standing shoulder abduction to 90 deg (avoiding shrug) bilateral 3 x 15   Without: Wall push up with SA press hold 3 sec 2 x 10    Manual Supine Rt shoulder inferior glides in flexion, scaption, abduction g4, Mobilization c movement posterior Rt GH glide with passive ER mobility  TODAY'S TREATMENT:                                                                 DATE: 12/10/2022 TherEx UBE Fwd/back 3 mins each way lvl 3.0  Standing shoulder flexion to 90 deg (avoiding shrug) bilateral 1 lb 2 x 15  Standing shoulder abduction to 90 deg (avoiding shrug) bilateral 1 lb 2 x 10   Manual Supine Rt shoulder inferior glides in flexion, scaption, abduction g4, Mobilization c movement posterior Rt GH glide with passive ER mobility     TODAY'S TREATMENT:  DATE: 11/24/22 Therapeutic Exercise: Seated pulleys flexion/scaption 2 min each way  Gravity reduced shoulder flexion AROM (incline on table) 2x8 cues for reduced UT elevation Incline chest press with PVC pipe x8 Supine chest press with PVC pipe 2x15 Red band row x12, green band row x12 cues for elbow positioning and scapular mechanics Shoulder extension red band x10 cues for setup, green band x10 Sidelying ER 2x15 with towel prop and cues for positioning   TODAY'S TREATMENT:                                                                 DATE: 11/19/2022 TherEx Seated pulleys flexion, scaption 2 mins each way with 2-3 sec stretch. Use of Rt arm outstretched with eccentric lowering control from Rt arm Supine Rt shoulder flexion AROM 0 lbs x 10, 1 lb x 10  Rt sidelying Lt shoulder Abduction 0 lb weight 2 x 10 Rt sidelying Lt shoulder ER c towel under arm 2 x 10   Held  BFR activity today due to new progression of exercise.  May be appropriate to use next visit in active range movements.   TODAY'S TREATMENT:                                                                                             DATE: 11/17/22 TherEx Pulleys flexion and scaption x 3 min each ROM measurements - see above for details The following exercises performed with BFR Supine AA chest press with overhead flexion 1# bar; 3x15 Supine AA ER on Rt with 1# bar 3x15 Standing deltoid isometrics 15 x 5 sec hold (-way) with 30 sec rest between sets  BFR settings: Cuff size 2 Supine, sitting and standing LOP 150 mmHg (exercised at 75 mmHg)    PATIENT EDUCATION: 12/10/2022 Education details: HEP update:  Person educated: Patient Education method: Programmer, multimedia, Facilities manager, Verbal/visual  cues, and Handouts Education comprehension: verbalized understanding, returned demonstration, verbal cues required  HOME EXERCISE PROGRAM: Access Code: [email or web address] URL: [email or web address] Date: 12/10/2022 Prepared by: Chyrel Masson  Exercises - Standing Scapular Retraction  - 5 x daily - 7 x weekly - 1 sets - 5 reps - 5 second hold - Single Arm Doorway Pec Stretch at 90 Degrees Abduction  - 2-3 x daily - 7 x weekly - 1 sets - 3-5 reps - 15-30 hold - Standing shoulder flexion wall slides  - 2-3 x daily - 7 x weekly - 1 sets - 10 reps - 5 hold - Supine Shoulder Flexion Extension Full Range AROM  - 1-2 x daily - 7 x weekly - 2-3 sets - 10-20 reps - Sidelying Shoulder Abduction Palm Forward  - 1 x daily - 7 x weekly - 2-3 sets - 10-20 reps - Sidelying Shoulder External Rotation (Mirrored)  - 1 x daily - 7 x weekly - 2-3 sets - 10-20 reps - Standing Bilateral Low Shoulder Row with  Anchored Resistance  - 1-2 x daily - 7 x weekly - 2-3 sets - 10-15 reps - Shoulder Extension with Resistance  - 1-2 x daily - 7 x weekly - 1-2 sets - 10-15 reps - Standing Shoulder Flexion to 90 Degrees with  Dumbbells  - 1 x daily - 7 x weekly - 2-3 sets - 10-15 reps - Shoulder Abduction with Dumbbells - Thumbs Up  - 1 x daily - 7 x weekly - 2-3 sets - 10-15 reps  ASSESSMENT:  CLINICAL IMPRESSION: Guarding mainly noted in ER mobility interventions today.  Cues for avoding pain exacerbation in stretching to avoid more guarding.  Longer duration stretching may also help progress.  Continued use of BFR for strengthening program to promote improve functional strength.    OBJECTIVE IMPAIRMENTS: decreased activity tolerance, decreased endurance, decreased knowledge of condition, decreased ROM, decreased strength, decreased safety awareness, increased edema, impaired perceived functional ability, increased muscle spasms, impaired UE functional use, postural dysfunction, and pain.   ACTIVITY LIMITATIONS: carrying, lifting, sleeping, toileting, dressing, reach over head, and hygiene/grooming  PARTICIPATION LIMITATIONS: meal prep, cleaning, laundry, driving, shopping, community activity, and occupation  PERSONAL FACTORS: OA left 1st CMC joint, right foot surgery, significant kidney history are also affecting patient's functional outcome.   REHAB POTENTIAL: Good  CLINICAL DECISION MAKING: Stable/uncomplicated  EVALUATION COMPLEXITY: Low   GOALS: Goals reviewed with patient? Yes  SHORT TERM GOALS: Target date: 11/21/2022  Marcellus will be independent with his day 1 HEP Baseline: Started 10/24/2022 Goal status: MET 11/17/22  2.  Improve right shoulder passive range of motion for flexion to 150 degrees; internal rotation 50 degrees and external rotation to 70 degrees  Goal status: partially met  11/19/22  3.  Carel will be able to sleep at least 4 hours uninterrupted Baseline: 1 to 2 hours Goal status: MET 11/17/22   LONG TERM GOALS: Target date: 01/02/2023  Improve FOTO to 68 in 18 visits Baseline: 32 Goal status: on going 12/10/2022  2.  Improve right should pain to consistently 0-3/10 on the  VAS. Baseline: 6-10 out of 10 Goal status on going 12/10/2022  3.  Improve right shoulder AROM for flexion to 170 degrees; ER to 90 degrees; IR to 60 degrees and horizontal adduction to 40 degrees Baseline: See above Goal status: on going 12/10/2022  4.  Improve right shoulder strength for ER to at least 15 pounds and IR to at least 30 pounds.  60% strength right vs left is expected 12 weeks post-surgery with 90% or better strength 9-12 months post-surgery. Baseline: Deferred secondary to surgery 15 days ago at evaluation Goal status: on going 12/10/2022  5.  Bereket will be independent with his long-term maintenance HEP at DC Baseline: Started 10/24/2022 Goal status: on going 12/10/2022   PLAN:  PT FREQUENCY: 1-2x/week  PT DURATION: 10 weeks  PLANNED INTERVENTIONS: Therapeutic exercises, Therapeutic activity, Neuromuscular re-education, Patient/Family education, Self Care, Joint mobilization, Cryotherapy, Vasopneumatic device, and Manual therapy  PLAN FOR NEXT SESSION: 10th visit on next visit.  Avoid shrug in strengthening. Manual for mobility progression.    Chyrel Masson, PT, DPT, OCS, ATC 12/12/22  8:33 AM

## 2022-12-12 ENCOUNTER — Ambulatory Visit: Payer: BC Managed Care – PPO | Admitting: Rehabilitative and Restorative Service Providers"

## 2022-12-12 ENCOUNTER — Encounter: Payer: Self-pay | Admitting: Rehabilitative and Restorative Service Providers"

## 2022-12-12 DIAGNOSIS — R600 Localized edema: Secondary | ICD-10-CM

## 2022-12-12 DIAGNOSIS — M6281 Muscle weakness (generalized): Secondary | ICD-10-CM

## 2022-12-12 DIAGNOSIS — M25511 Pain in right shoulder: Secondary | ICD-10-CM | POA: Diagnosis not present

## 2022-12-12 DIAGNOSIS — M25611 Stiffness of right shoulder, not elsewhere classified: Secondary | ICD-10-CM | POA: Diagnosis not present

## 2022-12-16 ENCOUNTER — Ambulatory Visit (INDEPENDENT_AMBULATORY_CARE_PROVIDER_SITE_OTHER): Payer: BC Managed Care – PPO | Admitting: Physical Therapy

## 2022-12-16 ENCOUNTER — Encounter: Payer: Self-pay | Admitting: Physical Therapy

## 2022-12-16 DIAGNOSIS — M25511 Pain in right shoulder: Secondary | ICD-10-CM

## 2022-12-16 DIAGNOSIS — M25611 Stiffness of right shoulder, not elsewhere classified: Secondary | ICD-10-CM | POA: Diagnosis not present

## 2022-12-16 DIAGNOSIS — M6281 Muscle weakness (generalized): Secondary | ICD-10-CM | POA: Diagnosis not present

## 2022-12-16 DIAGNOSIS — R600 Localized edema: Secondary | ICD-10-CM | POA: Diagnosis not present

## 2022-12-16 NOTE — Therapy (Signed)
OUTPATIENT PHYSICAL THERAPY TREATMENT   Patient Name: Mitchell Hughes MRN: [id number] DOB:08/21/1965, 57 y.o., male Today's Date: 12/16/2022  END OF SESSION:  PT End of Session - 12/16/22 0800     Visit Number 10    Number of Visits 18    Date for PT Re-Evaluation 01/02/23    Authorization Type [employer] 20% coinsurance    Authorization - Number of Visits 60    Progress Note Due on Visit 18    PT Start Time 0800    PT Stop Time 0843    PT Time Calculation (min) 43 min    Activity Tolerance Patient tolerated treatment well    Behavior During Therapy [facility] for tasks assessed/performed               Past Medical History:  Diagnosis Date   Arthritis    Erectile dysfunction    Family history of colon cancer    sister in 65s   Family history of heart disease 2011   baseline cardiac eval 2011 with Dr. Royann Shivers   H/O echocardiogram 2015   Dr. Donnie Aho   H/O echocardiogram 02/2019   History of exercise stress test    04/2014 Dr. Donnie Aho, prior 2011 normal Bruce treadmill stress test, Dr. Rennis Golden   History of kidney stones    LOW BACK PAIN, ACUTE 09/26/2009   OSA (obstructive sleep apnea) 01/2022   has cpap   TOBACCO USE, QUIT 09/26/2009   Wears glasses    Past Surgical History:  Procedure Laterality Date   COLONOSCOPY  08/2017   tubular adenoma, repeat 5 years; Dr. Ileene Patrick   FOOT SURGERY     bone spur, right; Dr. Rolland Bimler HERNIA REPAIR Bilateral 04/11/2022   Procedure: LAPAROSCOPIC BILATERAL INGUINAL HERNIA REPAIR with MESH;  Surgeon: Karie Soda, MD;  Location: [facility];  Service: General;  Laterality: Bilateral;  LOCAL   IR NEPHROSTOMY PLACEMENT RIGHT  04/14/2021   NEPHROLITHOTOMY Right 05/17/2021   Procedure: NEPHROLITHOTOMY PERCUTANEOUS insertion double j stent;  Surgeon: Sebastian Ache, MD;  Location: [facility];  Service: Urology;  Laterality: Right;   ROBOT ASSISTED LAPAROSCOPIC NEPHRECTOMY Left 07/26/2021   Procedure: XI ROBOTIC ASSISTED LAPAROSCOPIC  NEPHRECTOMY;  Surgeon: Sebastian Ache, MD;  Location: [facility];  Service: Urology;  Laterality: Left;  3 HRS   UMBILICAL HERNIA REPAIR N/A 04/11/2022   Procedure: HERNIA REPAIR UMBILICAL ADULT;  Surgeon: Karie Soda, MD;  Location: [facility];  Service: General;  Laterality: N/A;  PRIMARY UMBILICAL HERNIA REPAIR   WISDOM TOOTH EXTRACTION     WISDOM TOOTH EXTRACTION     Patient Active Problem List   Diagnosis Date Noted   Nontraumatic complete tear of right rotator cuff 07/22/2022   Impingement syndrome of right shoulder 07/22/2022   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   Benign prostatic hyperplasia with weak urinary stream 02/24/2022   Urinary hesitancy 02/24/2022   COVID-19 vaccine administered 02/24/2022   Screening for lipid disorders 02/24/2022   History of renal cell cancer 02/24/2022   History of renal stone 02/24/2022   Umbilical hernia without obstruction and without gangrene 02/24/2022   Tendinitis of left triceps 01/21/2022   Renal stone 05/17/2021   Sepsis with acute organ dysfunction (HCC) 04/19/2021   Kidney stone 04/19/2021   Renal mass 04/19/2021   Splenomegaly 04/19/2021   Sepsis (HCC) 04/14/2021   Acute lower UTI 04/14/2021   Bilateral hand numbness 02/14/2021   Localized swelling on right hand 02/14/2021   Sleep disturbance 02/14/2021  Snoring 02/14/2021   Erectile dysfunction 02/14/2021   Need for influenza vaccination 02/14/2021   Impingement syndrome of left shoulder 06/20/2020   Primary osteoarthritis of first carpometacarpal joint of left hand 06/20/2020   Encounter for health maintenance examination in adult 02/17/2020   Screening for prostate cancer 02/17/2020   History of COVID-19 02/17/2020   Toenail deformity 02/17/2020   Neuropathy of foot, right 02/17/2020   Chronic pain of both shoulders 04/22/2019   Decreased range of motion of shoulder 04/22/2019   Degenerative arthritis of thumb, left 04/22/2019   Right inguinal hernia 01/28/2019    Aortic valve calcification 01/28/2019   Vaccine counseling 06/22/2017   Family history of heart disease 12/21/2013   Family history of colon cancer 12/21/2013   Nephrolithiasis 09/28/2009    PCP: Jac Canavan, PA-C  REFERRING PROVIDER: Cristie Hem, PA-C  REFERRING DIAG:  Diagnosis  [phone] (ICD-10-CM) - S/P arthroscopy of right shoulder    THERAPY DIAG:  Acute pain of right shoulder  Stiffness of right shoulder, not elsewhere classified  Localized edema  Muscle weakness (generalized)  Rationale for Evaluation and Treatment: Rehabilitation  ONSET DATE: October 09, 2022 surgery  SUBJECTIVE:                                                                                                                                                                                      SUBJECTIVE STATEMENT: Doing pretty well; no pain in the shoulder.  Still having trouble raising arm overhead.  Hand dominance: Right  PERTINENT HISTORY: OA left 1st CMC joint, right foot surgery, significant kidney history  PAIN:  NPRS scale: 0/10 upon arrival.  Pain location: right shoulder Pain description: Achy with occasional sharp, stabbing Aggravating factors: Most right shoulder use Relieving factors: Oxycodone day and night  PRECAUTIONS: Other: Rotator cuff repair 10/09/2022  WEIGHT BEARING RESTRICTIONS: Yes None on right upper extremity  FALLS:  Has patient fallen in last 6 months? 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  Moderate distal infraspinatus tendinosis with articular and bursal sided fraying. Mild distal subscapularis tendinosis. No significant muscle atrophy.   Mild intra-articular long head biceps tendinosis.   Mild glenohumeral and AC joint osteoarthritis.  PATIENT SURVEYS:  12/10/2022:  FOTO update:  61  10/24/22:  FOTO 32 (Goal 68 in 18 visits)  COGNITION: 10/24/2022 Overall cognitive status: Within functional limits for tasks assessed     SENSATION: 10/24/2022 Shephard has no complaints of peripheral pain or paresthesias related to his right shoulder surgery  POSTURE: 10/24/2022 Mild forward head, internally rotated and protracted shoulders  UPPER EXTREMITY ROM:   Passive ROM Left/Right 10/24/2022 Right 10/30/22 Right 11/17/22 Right 11/24/22 Right 12/10/2022  Shoulder flexion 135/90 P: 100 P: 134 A: 106 deg, no pain, noted shrug AROM in supine 132  Shoulder extension       Shoulder abduction  P: 104 P: 120 A: 89 deg no pain, shrug and trunk lean noted  AROM in supine 108  Shoulder adduction       Shoulder internal rotation 70/35    60 AROM in supine abduction 45 deg  Shoulder external rotation 85/10 P: 10 P: 30  40 AROM in supine abduction 45 deg  (Blank rows = not tested)  UPPER EXTREMITY Strength:  MMT Left/Right 10/24/2022 Right 12/10/2022 Left 12/10/2022  Shoulder flexion  4/5 5/5  Shoulder extension     Shoulder abduction  3+/5 5/5  Shoulder adduction     Shoulder internal rotation Deferred 4+/5 5/5  Shoulder external rotation Deferred 4/5 5/5  (Blank rows = not tested) Strength testing was deferred today secondary to being 15 days status post right rotator cuff repair   BFR settings: Cuff size 2 Supine, sitting and standing LOP 150 mmHg (exercised at 75 mmHg)                   TODAY'S TREATMENT: 12/16/22 TherEx UBE L5 x 6 min (3 min each direction)  With BFR Wall ladder shoulder flexion x 10 reps with 5 sec hold at end range Shoulder flexion 2x10 with 3# bar Shoulder  abduction AA concentric; A eccentric 2x10 with 3# bar Standing shoulder flexion to 90 deg (avoiding shrug) Rt 3 x 15; 2# Standing shoulder abduction to 90 deg (avoiding shrug) Rt 3 x 15   Manual Supine Rt shoulder inferior glides in flexion, scaption, abduction g4, PROM Rt shoulder all directions to tolerance  12/12/2022 TherEx Pulley flexion, scaption with outstretched arm with eccentric lowering focus 3 mins each way   With BFR: Sidelying Rt shoulder abduction 1 lb 3 x 15 Sidelying Rt shoulder ER c arm at side 3 x 15 2 lb  Standing shoulder flexion to 90 deg (avoiding shrug) bilateral 3 x 15  Standing shoulder abduction to 90 deg (avoiding shrug) bilateral 3 x 15   Without: Wall push up with SA press hold 3 sec 2 x 10    Manual Supine Rt shoulder inferior glides in flexion, scaption, abduction g4, Mobilization c movement posterior Rt GH glide with passive ER mobility  12/10/2022 TherEx UBE Fwd/back 3 mins each way lvl 3.0  Standing shoulder flexion to 90 deg (avoiding shrug) bilateral 1 lb 2 x 15  Standing shoulder abduction to 90 deg (avoiding shrug) bilateral 1 lb 2 x 10   Manual Supine Rt shoulder inferior glides in flexion, scaption, abduction g4, Mobilization c movement posterior Rt GH glide with passive ER mobility   11/24/22 Therapeutic Exercise: Seated pulleys flexion/scaption 2 min each way  Gravity reduced shoulder flexion AROM (incline on table) 2x8 cues for reduced UT elevation Incline chest press with PVC pipe x8 Supine chest press with PVC pipe 2x15 Red band row x12, green band row x12 cues for elbow positioning and scapular mechanics Shoulder extension red band x10  cues for setup, green band x10 Sidelying ER 2x15 with towel prop and cues for positioning   PATIENT EDUCATION: 12/10/2022 Education details: HEP update:  Person educated: Patient Education method: Programmer, multimedia, Facilities manager, Verbal/visual  cues, and Handouts Education comprehension: verbalized  understanding, returned demonstration, verbal cues required  HOME EXERCISE PROGRAM: Access Code: [email or web address] URL: [email or web address] Date: 12/10/2022 Prepared by: Chyrel Masson  Exercises - Standing Scapular Retraction  - 5 x daily - 7 x weekly - 1 sets - 5 reps - 5 second hold - Single Arm Doorway Pec Stretch at 90 Degrees Abduction  - 2-3 x daily - 7 x weekly - 1 sets - 3-5 reps - 15-30 hold - Standing shoulder flexion wall slides  - 2-3 x daily - 7 x weekly - 1 sets - 10 reps - 5 hold - Supine Shoulder Flexion Extension Full Range AROM  - 1-2 x daily - 7 x weekly - 2-3 sets - 10-20 reps - Sidelying Shoulder Abduction Palm Forward  - 1 x daily - 7 x weekly - 2-3 sets - 10-20 reps - Sidelying Shoulder External Rotation (Mirrored)  - 1 x daily - 7 x weekly - 2-3 sets - 10-20 reps - Standing Bilateral Low Shoulder Row with Anchored Resistance  - 1-2 x daily - 7 x weekly - 2-3 sets - 10-15 reps - Shoulder Extension with Resistance  - 1-2 x daily - 7 x weekly - 1-2 sets - 10-15 reps - Standing Shoulder Flexion to 90 Degrees with Dumbbells  - 1 x daily - 7 x weekly - 2-3 sets - 10-15 reps - Shoulder Abduction with Dumbbells - Thumbs Up  - 1 x daily - 7 x weekly - 2-3 sets - 10-15 reps  ASSESSMENT:  CLINICAL IMPRESSION: Pt still with tightness at end ranges but overall tolerating sessions well.  Will continue to benefit from PT to maximize function.  OBJECTIVE IMPAIRMENTS: decreased activity tolerance, decreased endurance, decreased knowledge of condition, decreased ROM, decreased strength, decreased safety awareness, increased edema, impaired perceived functional ability, increased muscle spasms, impaired UE functional use, postural dysfunction, and pain.   ACTIVITY LIMITATIONS: carrying, lifting, sleeping, toileting, dressing, reach over head, and hygiene/grooming  PARTICIPATION LIMITATIONS: meal prep, cleaning, laundry, driving, shopping, community activity, and  occupation  PERSONAL FACTORS: OA left 1st CMC joint, right foot surgery, significant kidney history are also affecting patient's functional outcome.   REHAB POTENTIAL: Good  CLINICAL DECISION MAKING: Stable/uncomplicated  EVALUATION COMPLEXITY: Low   GOALS: Goals reviewed with patient? Yes  SHORT TERM GOALS: Target date: 11/21/2022  Markee will be independent with his day 1 HEP Baseline: Started 10/24/2022 Goal status: MET 11/17/22  2.  Improve right shoulder passive range of motion for flexion to 150 degrees; internal rotation 50 degrees and external rotation to 70 degrees Goal status: partially met  11/19/22  3.  Omarie will be able to sleep at least 4 hours uninterrupted Baseline: 1 to 2 hours Goal status: MET 11/17/22   LONG TERM GOALS: Target date: 01/02/2023  Improve FOTO to 68 in 18 visits Baseline: 32 Goal status: on going 12/10/2022  2.  Improve right should pain to consistently 0-3/10 on the VAS. Baseline: 6-10 out of 10 Goal status on going 12/10/2022  3.  Improve right shoulder AROM for flexion to 170 degrees; ER to 90 degrees; IR to 60 degrees and horizontal adduction to 40 degrees Baseline: See above Goal status: on going 12/10/2022  4.  Improve right shoulder strength for ER to at least 15  pounds and IR to at least 30 pounds.  60% strength right vs left is expected 12 weeks post-surgery with 90% or better strength 9-12 months post-surgery. Baseline: Deferred secondary to surgery 15 days ago at evaluation Goal status: on going 12/10/2022  5.  Rahem will be independent with his long-term maintenance HEP at DC Baseline: Started 10/24/2022 Goal status: on going 12/10/2022   PLAN:  PT FREQUENCY: 1-2x/week  PT DURATION: 10 weeks  PLANNED INTERVENTIONS: Therapeutic exercises, Therapeutic activity, Neuromuscular re-education, Patient/Family education, Self Care, Joint mobilization, Cryotherapy, Vasopneumatic device, and Manual therapy  PLAN FOR NEXT SESSION: Continue to  maximize ROM, work on strengthening    Clarita Crane, PT, DPT 12/16/22 8:59 AM

## 2022-12-18 ENCOUNTER — Encounter: Payer: BC Managed Care – PPO | Admitting: Physical Therapy

## 2022-12-22 ENCOUNTER — Ambulatory Visit (INDEPENDENT_AMBULATORY_CARE_PROVIDER_SITE_OTHER): Payer: BC Managed Care – PPO | Admitting: Physical Therapy

## 2022-12-22 ENCOUNTER — Encounter: Payer: Self-pay | Admitting: Physical Therapy

## 2022-12-22 DIAGNOSIS — M6281 Muscle weakness (generalized): Secondary | ICD-10-CM

## 2022-12-22 DIAGNOSIS — M25611 Stiffness of right shoulder, not elsewhere classified: Secondary | ICD-10-CM

## 2022-12-22 DIAGNOSIS — M25511 Pain in right shoulder: Secondary | ICD-10-CM | POA: Diagnosis not present

## 2022-12-22 DIAGNOSIS — R600 Localized edema: Secondary | ICD-10-CM

## 2022-12-22 NOTE — Therapy (Signed)
OUTPATIENT PHYSICAL THERAPY TREATMENT   Patient Name: Mitchell Hughes MRN: [id number] DOB:1966-01-02, 57 y.o., male Today's Date: 12/22/2022  END OF SESSION:  Mitchell Hughes End of Session - 12/22/22 0801     Visit Number 11    Number of Visits 18    Date for Mitchell Hughes Re-Evaluation 01/02/23    Authorization Type [employer] 20% coinsurance    Authorization - Number of Visits 60    Progress Note Due on Visit 18    Mitchell Hughes Start Time 0801    Mitchell Hughes Stop Time 0840    Mitchell Hughes Time Calculation (min) 39 min    Activity Tolerance Patient tolerated treatment well    Behavior During Therapy [facility] for tasks assessed/performed                Past Medical History:  Diagnosis Date   Arthritis    Erectile dysfunction    Family history of colon cancer    sister in 79s   Family history of heart disease 2011   baseline cardiac eval 2011 with Dr. Royann Shivers   H/O echocardiogram 2015   Dr. Donnie Aho   H/O echocardiogram 02/2019   History of exercise stress test    04/2014 Dr. Donnie Aho, prior 2011 normal Bruce treadmill stress test, Dr. Rennis Golden   History of kidney stones    LOW BACK PAIN, ACUTE 09/26/2009   OSA (obstructive sleep apnea) 01/2022   has cpap   TOBACCO USE, QUIT 09/26/2009   Wears glasses    Past Surgical History:  Procedure Laterality Date   COLONOSCOPY  08/2017   tubular adenoma, repeat 5 years; Dr. Ileene Patrick   FOOT SURGERY     bone spur, right; Dr. Rolland Bimler HERNIA REPAIR Bilateral 04/11/2022   Procedure: LAPAROSCOPIC BILATERAL INGUINAL HERNIA REPAIR with MESH;  Surgeon: Karie Soda, MD;  Location: [facility];  Service: General;  Laterality: Bilateral;  LOCAL   IR NEPHROSTOMY PLACEMENT RIGHT  04/14/2021   NEPHROLITHOTOMY Right 05/17/2021   Procedure: NEPHROLITHOTOMY PERCUTANEOUS insertion double j stent;  Surgeon: Sebastian Ache, MD;  Location: [facility];  Service: Urology;  Laterality: Right;   ROBOT ASSISTED LAPAROSCOPIC NEPHRECTOMY Left 07/26/2021   Procedure: XI ROBOTIC ASSISTED LAPAROSCOPIC  NEPHRECTOMY;  Surgeon: Sebastian Ache, MD;  Location: [facility];  Service: Urology;  Laterality: Left;  3 HRS   UMBILICAL HERNIA REPAIR N/A 04/11/2022   Procedure: HERNIA REPAIR UMBILICAL ADULT;  Surgeon: Karie Soda, MD;  Location: [facility];  Service: General;  Laterality: N/A;  PRIMARY UMBILICAL HERNIA REPAIR   WISDOM TOOTH EXTRACTION     WISDOM TOOTH EXTRACTION     Patient Active Problem List   Diagnosis Date Noted   Nontraumatic complete tear of right rotator cuff 07/22/2022   Impingement syndrome of right shoulder 07/22/2022   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   Benign prostatic hyperplasia with weak urinary stream 02/24/2022   Urinary hesitancy 02/24/2022   COVID-19 vaccine administered 02/24/2022   Screening for lipid disorders 02/24/2022   History of renal cell cancer 02/24/2022   History of renal stone 02/24/2022   Umbilical hernia without obstruction and without gangrene 02/24/2022   Tendinitis of left triceps 01/21/2022   Renal stone 05/17/2021   Sepsis with acute organ dysfunction (HCC) 04/19/2021   Kidney stone 04/19/2021   Renal mass 04/19/2021   Splenomegaly 04/19/2021   Sepsis (HCC) 04/14/2021   Acute lower UTI 04/14/2021   Bilateral hand numbness 02/14/2021   Localized swelling on right hand 02/14/2021   Sleep disturbance 02/14/2021  Snoring 02/14/2021   Erectile dysfunction 02/14/2021   Need for influenza vaccination 02/14/2021   Impingement syndrome of left shoulder 06/20/2020   Primary osteoarthritis of first carpometacarpal joint of left hand 06/20/2020   Encounter for health maintenance examination in adult 02/17/2020   Screening for prostate cancer 02/17/2020   History of COVID-19 02/17/2020   Toenail deformity 02/17/2020   Neuropathy of foot, right 02/17/2020   Chronic pain of both shoulders 04/22/2019   Decreased range of motion of shoulder 04/22/2019   Degenerative arthritis of thumb, left 04/22/2019   Right inguinal hernia 01/28/2019    Aortic valve calcification 01/28/2019   Vaccine counseling 06/22/2017   Family history of heart disease 12/21/2013   Family history of colon cancer 12/21/2013   Nephrolithiasis 09/28/2009    PCP: Jac Canavan, PA-C  REFERRING PROVIDER: Cristie Hem, PA-C  REFERRING DIAG:  Diagnosis  [id number] (ICD-10-CM) - S/P arthroscopy of right shoulder    THERAPY DIAG:  Acute pain of right shoulder  Stiffness of right shoulder, not elsewhere classified  Localized edema  Muscle weakness (generalized)  Rationale for Evaluation and Treatment: Rehabilitation  ONSET DATE: October 09, 2022 surgery  SUBJECTIVE:                                                                                                                                                                                      SUBJECTIVE STATEMENT: Sore from last session; resolved at this time  Hand dominance: Right  PERTINENT HISTORY: OA left 1st CMC joint, right foot surgery, significant kidney history  PAIN:  NPRS scale: 0/10 upon arrival.  Pain location: right shoulder Pain description: Achy with occasional sharp, stabbing Aggravating factors: Most right shoulder use Relieving factors: Oxycodone day and night  PRECAUTIONS: Other: Rotator cuff repair 10/09/2022  WEIGHT BEARING RESTRICTIONS: Yes None on right upper extremity  FALLS:  Has patient fallen in last 6 months? 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 Moderate distal infraspinatus tendinosis  with articular and bursal sided fraying. Mild distal subscapularis tendinosis. No significant muscle atrophy.   Mild intra-articular long head biceps tendinosis.   Mild glenohumeral and AC joint osteoarthritis.  PATIENT SURVEYS:  12/10/2022:  FOTO update:  61  10/24/22: FOTO 32 (Goal 68 in 18 visits)  COGNITION: 10/24/2022 Overall cognitive status: Within functional limits for tasks assessed     SENSATION: 10/24/2022 Mitchell Hughes has no complaints of peripheral pain or paresthesias related to his right shoulder surgery  POSTURE: 10/24/2022 Mild forward head, internally rotated and protracted shoulders  UPPER EXTREMITY ROM:   Passive ROM Left/Right 10/24/2022 Right 10/30/22 Right 11/17/22 Right 11/24/22 Right 12/10/2022  Shoulder flexion 135/90 P: 100 P: 134 A: 106 deg, no pain, noted shrug AROM in supine 132  Shoulder extension       Shoulder abduction  P: 104 P: 120 A: 89 deg no pain, shrug and trunk lean noted  AROM in supine 108  Shoulder adduction       Shoulder internal rotation 70/35    60 AROM in supine abduction 45 deg  Shoulder external rotation 85/10 P: 10 P: 30  40 AROM in supine abduction 45 deg  (Blank rows = not tested)  UPPER EXTREMITY Strength:  MMT Left/Right 10/24/2022 Right 12/10/2022 Left 12/10/2022  Shoulder flexion  4/5 5/5  Shoulder extension     Shoulder abduction  3+/5 5/5  Shoulder adduction     Shoulder internal rotation Deferred 4+/5 5/5  Shoulder external rotation Deferred 4/5 5/5  (Blank rows = not tested) Strength testing was deferred today secondary to being 15 days status post right rotator cuff repair   BFR settings: Cuff size 2 Supine, sitting and standing LOP 150 mmHg (exercised at 75 mmHg)                   TODAY'S TREATMENT: 12/22/22 TherEx UBE L5 x 6 min (3 min each direction) Doorway ER stretch 5x20 sec hold, Rt  With BFR (LOP 170 mmHg, exercises at 85 mmHg) Wall ladder shoulder flexion and scaption x 10 reps with 5 sec hold at end  range Standing shoulder flexion to 90 deg (avoiding shrug) Rt 3 x 15; 2# Standing shoulder abduction to 90 deg (avoiding shrug) Rt 3x15; 2# ER L3 band on Rt; 3x15 IR L3 band on Rt; 3x15    12/16/22 TherEx UBE L5 x 6 min (3 min each direction)  With BFR Wall ladder shoulder flexion x 10 reps with 5 sec hold at end range Shoulder flexion 2x10 with 3# bar Shoulder abduction AA concentric; A eccentric 2x10 with 3# bar Standing shoulder flexion to 90 deg (avoiding shrug) Rt 3 x 15; 2# Standing shoulder abduction to 90 deg (avoiding shrug) Rt 3 x 15   Manual Supine Rt shoulder inferior glides in flexion, scaption, abduction g4, PROM Rt shoulder all directions to tolerance  12/12/2022 TherEx Pulley flexion, scaption with outstretched arm with eccentric lowering focus 3 mins each way   With BFR: Sidelying Rt shoulder abduction 1 lb 3 x 15 Sidelying Rt shoulder ER c arm at side 3 x 15 2 lb  Standing shoulder flexion to 90 deg (avoiding shrug) bilateral 3 x 15  Standing shoulder abduction to 90 deg (avoiding shrug) bilateral 3 x 15   Without: Wall push up with SA press hold 3 sec 2 x 10    Manual Supine Rt shoulder inferior glides in flexion, scaption, abduction g4, Mobilization c movement posterior Rt GH glide with passive ER mobility  12/10/2022 TherEx UBE Fwd/back 3 mins each way lvl 3.0  Standing shoulder flexion to 90 deg (avoiding shrug) bilateral 1 lb 2 x 15  Standing shoulder abduction to 90 deg (avoiding shrug) bilateral 1 lb 2 x 10   Manual Supine Rt shoulder inferior glides in flexion,  scaption, abduction g4, Mobilization c movement posterior Rt GH glide with passive ER mobility   11/24/22 Therapeutic Exercise: Seated pulleys flexion/scaption 2 min each way  Gravity reduced shoulder flexion AROM (incline on table) 2x8 cues for reduced UT elevation Incline chest press with PVC pipe x8 Supine chest press with PVC pipe 2x15 Red band row x12, green band row x12 cues for  elbow positioning and scapular mechanics Shoulder extension red band x10 cues for setup, green band x10 Sidelying ER 2x15 with towel prop and cues for positioning   PATIENT EDUCATION: 12/10/2022 Education details: HEP update:  Person educated: Patient Education method: Programmer, multimedia, Facilities manager, Verbal/visual  cues, and Handouts Education comprehension: verbalized understanding, returned demonstration, verbal cues required  HOME EXERCISE PROGRAM: Access Code: [email or web address] URL: [email or web address] Date: 12/10/2022 Prepared by: Chyrel Masson  Exercises - Standing Scapular Retraction  - 5 x daily - 7 x weekly - 1 sets - 5 reps - 5 second hold - Single Arm Doorway Pec Stretch at 90 Degrees Abduction  - 2-3 x daily - 7 x weekly - 1 sets - 3-5 reps - 15-30 hold - Standing shoulder flexion wall slides  - 2-3 x daily - 7 x weekly - 1 sets - 10 reps - 5 hold - Supine Shoulder Flexion Extension Full Range AROM  - 1-2 x daily - 7 x weekly - 2-3 sets - 10-20 reps - Sidelying Shoulder Abduction Palm Forward  - 1 x daily - 7 x weekly - 2-3 sets - 10-20 reps - Sidelying Shoulder External Rotation (Mirrored)  - 1 x daily - 7 x weekly - 2-3 sets - 10-20 reps - Standing Bilateral Low Shoulder Row with Anchored Resistance  - 1-2 x daily - 7 x weekly - 2-3 sets - 10-15 reps - Shoulder Extension with Resistance  - 1-2 x daily - 7 x weekly - 1-2 sets - 10-15 reps - Standing Shoulder Flexion to 90 Degrees with Dumbbells  - 1 x daily - 7 x weekly - 2-3 sets - 10-15 reps - Shoulder Abduction with Dumbbells - Thumbs Up  - 1 x daily - 7 x weekly - 2-3 sets - 10-15 reps  ASSESSMENT:  CLINICAL IMPRESSION: Mitchell Hughes tolerated session well today with contiued focus on maximizing ROM and strengthening. Will continue to benefit from Mitchell Hughes to maximize function.  OBJECTIVE IMPAIRMENTS: decreased activity tolerance, decreased endurance, decreased knowledge of condition, decreased ROM, decreased strength, decreased  safety awareness, increased edema, impaired perceived functional ability, increased muscle spasms, impaired UE functional use, postural dysfunction, and pain.   ACTIVITY LIMITATIONS: carrying, lifting, sleeping, toileting, dressing, reach over head, and hygiene/grooming  PARTICIPATION LIMITATIONS: meal prep, cleaning, laundry, driving, shopping, community activity, and occupation  PERSONAL FACTORS: OA left 1st CMC joint, right foot surgery, significant kidney history are also affecting patient's functional outcome.   REHAB POTENTIAL: Good  CLINICAL DECISION MAKING: Stable/uncomplicated  EVALUATION COMPLEXITY: Low   GOALS: Goals reviewed with patient? Yes  SHORT TERM GOALS: Target date: 11/21/2022  Mitchell Hughes will be independent with his day 1 HEP Baseline: Started 10/24/2022 Goal status: MET 11/17/22  2.  Improve right shoulder passive range of motion for flexion to 150 degrees; internal rotation 50 degrees and external rotation to 70 degrees Goal status: partially met  11/19/22  3.  Mitchell Hughes will be able to sleep at least 4 hours uninterrupted Baseline: 1 to 2 hours Goal status: MET 11/17/22   LONG TERM GOALS: Target date: 01/02/2023  Improve FOTO to 68 in 18 visits Baseline: 32  Goal status: on going 12/10/2022  2.  Improve right should pain to consistently 0-3/10 on the VAS. Baseline: 6-10 out of 10 Goal status on going 12/10/2022  3.  Improve right shoulder AROM for flexion to 170 degrees; ER to 90 degrees; IR to 60 degrees and horizontal adduction to 40 degrees Baseline: See above Goal status: on going 12/10/2022  4.  Improve right shoulder strength for ER to at least 15 pounds and IR to at least 30 pounds.  60% strength right vs left is expected 12 weeks post-surgery with 90% or better strength 9-12 months post-surgery. Baseline: Deferred secondary to surgery 15 days ago at evaluation Goal status: on going 12/10/2022  5.  Mitchell Hughes will be independent with his long-term maintenance HEP  at [place] Baseline: Started 10/24/2022 Goal status: on going 12/10/2022   PLAN:  Mitchell Hughes FREQUENCY: 1-2x/week  Mitchell Hughes DURATION: 10 weeks  PLANNED INTERVENTIONS: Therapeutic exercises, Therapeutic activity, Neuromuscular re-education, Patient/Family education, Self Care, Joint mobilization, Cryotherapy, Vasopneumatic device, and Manual therapy  PLAN FOR NEXT SESSION: will need MD note, Continue to maximize ROM, work on strengthening    Clarita Crane, Mitchell Hughes, DPT 12/22/22 8:50 AM

## 2022-12-24 ENCOUNTER — Encounter: Payer: BC Managed Care – PPO | Admitting: Physical Therapy

## 2022-12-25 ENCOUNTER — Encounter: Payer: Self-pay | Admitting: Orthopaedic Surgery

## 2022-12-25 ENCOUNTER — Encounter: Payer: Self-pay | Admitting: Physical Therapy

## 2022-12-25 ENCOUNTER — Ambulatory Visit (INDEPENDENT_AMBULATORY_CARE_PROVIDER_SITE_OTHER): Payer: BC Managed Care – PPO | Admitting: Orthopaedic Surgery

## 2022-12-25 ENCOUNTER — Ambulatory Visit (INDEPENDENT_AMBULATORY_CARE_PROVIDER_SITE_OTHER): Payer: BC Managed Care – PPO | Admitting: Physical Therapy

## 2022-12-25 DIAGNOSIS — M25611 Stiffness of right shoulder, not elsewhere classified: Secondary | ICD-10-CM

## 2022-12-25 DIAGNOSIS — M7542 Impingement syndrome of left shoulder: Secondary | ICD-10-CM

## 2022-12-25 DIAGNOSIS — M6281 Muscle weakness (generalized): Secondary | ICD-10-CM

## 2022-12-25 DIAGNOSIS — M25511 Pain in right shoulder: Secondary | ICD-10-CM | POA: Diagnosis not present

## 2022-12-25 DIAGNOSIS — M75121 Complete rotator cuff tear or rupture of right shoulder, not specified as traumatic: Secondary | ICD-10-CM

## 2022-12-25 DIAGNOSIS — R600 Localized edema: Secondary | ICD-10-CM

## 2022-12-25 NOTE — Progress Notes (Signed)
Post-Op Visit Note   Patient: Mitchell Hughes           Date of Birth: 03-Jan-1966           MRN: [id number] Visit Date: 12/25/2022 PCP: Jac Canavan, PA-C   Assessment & Plan:  Chief Complaint:  Chief Complaint  Patient presents with   Right Shoulder - Follow-up    Right shoulder scope 10/09/2022   Visit Diagnoses:  1. Impingement syndrome of left shoulder   2. Nontraumatic complete tear of right rotator cuff     Plan: Mitchell Hughes is 12 weeks status post right rotator cuff repair.  He is doing physical therapy twice a week.  He feels like he is making progress.  Has occasional nighttime pain.  Examination of right shoulder shows fully healed surgical scars.  Range of motion measurements were noted from the physical therapy visit from this morning.  From my standpoint Mitchell Hughes needs more time to work on strength and range of motion.  He can drop down to once a week for physical therapy as he is very compliant and diligent with his home exercise program.  He is not fit to return back to work at this point because it requires a lot of lifting especially overhead and he would not be able to pass the fit test.  Will going to keep him out of work for another 2 months.  I would like to recheck him at that time.  Follow-Up Instructions: Return in about 2 months (around 02/24/2023).   Orders:  No orders of the defined types were placed in this encounter.  No orders of the defined types were placed in this encounter.   Imaging: No results found.  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   Benign prostatic hyperplasia with weak urinary stream 02/24/2022   Urinary hesitancy 02/24/2022   COVID-19 vaccine administered 02/24/2022   Screening for lipid disorders 02/24/2022   History of renal cell cancer  02/24/2022   History of renal stone 02/24/2022   Umbilical hernia without obstruction and without gangrene 02/24/2022   Tendinitis of left triceps 01/21/2022   Renal stone 05/17/2021   Sepsis with acute organ dysfunction (HCC) 04/19/2021   Kidney stone 04/19/2021   Renal mass 04/19/2021   Splenomegaly 04/19/2021   Sepsis (HCC) 04/14/2021   Acute lower UTI 04/14/2021   Bilateral hand numbness 02/14/2021   Localized swelling on right hand 02/14/2021   Sleep disturbance 02/14/2021   Snoring 02/14/2021   Erectile dysfunction 02/14/2021   Need for influenza vaccination 02/14/2021   Impingement syndrome of left shoulder 06/20/2020   Primary osteoarthritis of first carpometacarpal joint of left hand 06/20/2020   Encounter for health maintenance examination in adult 02/17/2020   Screening for prostate cancer 02/17/2020   History of COVID-19 02/17/2020   Toenail deformity 02/17/2020   Neuropathy of foot, right 02/17/2020   Chronic pain of both shoulders 04/22/2019   Decreased range of motion of shoulder 04/22/2019   Degenerative arthritis of thumb, left 04/22/2019   Right inguinal hernia 01/28/2019   Aortic valve calcification 01/28/2019   Vaccine counseling 06/22/2017   Family history of heart disease 12/21/2013   Family history of colon cancer 12/21/2013   Nephrolithiasis 09/28/2009   Past Medical History:  Diagnosis Date   Arthritis    Erectile dysfunction  Family history of colon cancer    sister in 49s   Family history of heart disease 2011   baseline cardiac eval 2011 with Dr. Royann Shivers   H/O echocardiogram 2015   Dr. Donnie Aho   H/O echocardiogram 02/2019   History of exercise stress test    04/2014 Dr. Donnie Aho, prior 2011 normal Bruce treadmill stress test, Dr. Rennis Golden   History of kidney stones    LOW BACK PAIN, ACUTE 09/26/2009   OSA (obstructive sleep apnea) 01/2022   has cpap   TOBACCO USE, QUIT 09/26/2009   Wears glasses     Family History  Problem Relation Age  of Onset   Thyroid disease Mother    Other Mother        brain disease, possibly dementia?   Gallstones Mother    Other Father        spinal cord injury/accident   Heart disease Father 53   Heart disease Sister 68       artificial valve   Colon cancer Sister    Sleep apnea Sister    Cancer Sister 66       colon   Sleep apnea Sister    Cancer Sister        pancreas   Pancreatic cancer Sister    Heart disease Sister    Heart disease Brother 77       MI   Diabetes Brother    Pancreatic cancer Brother    Heart disease Brother 44       pacemaker   Cancer Brother        liver, formerly pancreas   Heart disease Other        parent, other relative   Colon cancer Other    Stroke Neg Hx    Hypertension Neg Hx    Rectal cancer Neg Hx    Stomach cancer Neg Hx     Past Surgical History:  Procedure Laterality Date   COLONOSCOPY  08/2017   tubular adenoma, repeat 5 years; Dr. Ileene Patrick   FOOT SURGERY     bone spur, right; Dr. Rolland Bimler HERNIA REPAIR Bilateral 04/11/2022   Procedure: LAPAROSCOPIC BILATERAL INGUINAL HERNIA REPAIR with MESH;  Surgeon: Karie Soda, MD;  Location: [facility];  Service: General;  Laterality: Bilateral;  LOCAL   IR NEPHROSTOMY PLACEMENT RIGHT  04/14/2021   NEPHROLITHOTOMY Right 05/17/2021   Procedure: NEPHROLITHOTOMY PERCUTANEOUS insertion double j stent;  Surgeon: Sebastian Ache, MD;  Location: [facility];  Service: Urology;  Laterality: Right;   ROBOT ASSISTED LAPAROSCOPIC NEPHRECTOMY Left 07/26/2021   Procedure: XI ROBOTIC ASSISTED LAPAROSCOPIC NEPHRECTOMY;  Surgeon: Sebastian Ache, MD;  Location: [facility];  Service: Urology;  Laterality: Left;  3 HRS   UMBILICAL HERNIA REPAIR N/A 04/11/2022   Procedure: HERNIA REPAIR UMBILICAL ADULT;  Surgeon: Karie Soda, MD;  Location: [facility];  Service: General;  Laterality: N/A;  PRIMARY UMBILICAL HERNIA REPAIR   WISDOM TOOTH EXTRACTION     WISDOM TOOTH EXTRACTION     Social History   Occupational  History   Not on file  Tobacco Use   Smoking status: Former    Current packs/day: 0.00    Average packs/day: 0.3 packs/day for 30.0 years (7.5 ttl pk-yrs)    Types: Cigarettes    Start date: 03/06/1979    Quit date: 03/05/2009    Years since quitting: 13.8   Smokeless tobacco: Never   Tobacco comments:    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

## 2022-12-25 NOTE — Therapy (Signed)
OUTPATIENT PHYSICAL THERAPY TREATMENT   Patient Name: Mitchell Hughes MRN: [id number] DOB:May 08, 1965, 57 y.o., male Today's Date: 12/25/2022  END OF SESSION:  PT End of Session - 12/25/22 0933     Visit Number 12    Number of Visits 18    Date for PT Re-Evaluation 01/02/23    Authorization Type [employer] 20% coinsurance    Authorization - Number of Visits 60    Progress Note Due on Visit 18    PT Start Time 0933    PT Stop Time 1012    PT Time Calculation (min) 39 min    Activity Tolerance Patient tolerated treatment well    Behavior During Therapy [facility] for tasks assessed/performed                 Past Medical History:  Diagnosis Date   Arthritis    Erectile dysfunction    Family history of colon cancer    sister in 57s   Family history of heart disease 2011   baseline cardiac eval 2011 with Dr. Royann Shivers   H/O echocardiogram 2015   Dr. Donnie Aho   H/O echocardiogram 02/2019   History of exercise stress test    04/2014 Dr. Donnie Aho, prior 2011 normal Bruce treadmill stress test, Dr. Rennis Golden   History of kidney stones    LOW BACK PAIN, ACUTE 09/26/2009   OSA (obstructive sleep apnea) 01/2022   has cpap   TOBACCO USE, QUIT 09/26/2009   Wears glasses    Past Surgical History:  Procedure Laterality Date   COLONOSCOPY  08/2017   tubular adenoma, repeat 5 years; Dr. Ileene Patrick   FOOT SURGERY     bone spur, right; Dr. Rolland Bimler HERNIA REPAIR Bilateral 04/11/2022   Procedure: LAPAROSCOPIC BILATERAL INGUINAL HERNIA REPAIR with MESH;  Surgeon: Karie Soda, MD;  Location: [facility];  Service: General;  Laterality: Bilateral;  LOCAL   IR NEPHROSTOMY PLACEMENT RIGHT  04/14/2021   NEPHROLITHOTOMY Right 05/17/2021   Procedure: NEPHROLITHOTOMY PERCUTANEOUS insertion double j stent;  Surgeon: Sebastian Ache, MD;  Location: [facility];  Service: Urology;  Laterality: Right;   ROBOT ASSISTED LAPAROSCOPIC NEPHRECTOMY Left 07/26/2021   Procedure: XI ROBOTIC ASSISTED LAPAROSCOPIC  NEPHRECTOMY;  Surgeon: Sebastian Ache, MD;  Location: [facility];  Service: Urology;  Laterality: Left;  3 HRS   UMBILICAL HERNIA REPAIR N/A 04/11/2022   Procedure: HERNIA REPAIR UMBILICAL ADULT;  Surgeon: Karie Soda, MD;  Location: [facility];  Service: General;  Laterality: N/A;  PRIMARY UMBILICAL HERNIA REPAIR   WISDOM TOOTH EXTRACTION     WISDOM TOOTH EXTRACTION     Patient Active Problem List   Diagnosis Date Noted   Nontraumatic complete tear of right rotator cuff 07/22/2022   Impingement syndrome of right shoulder 07/22/2022   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   Benign prostatic hyperplasia with weak urinary stream 02/24/2022   Urinary hesitancy 02/24/2022   COVID-19 vaccine administered 02/24/2022   Screening for lipid disorders 02/24/2022   History of renal cell cancer 02/24/2022   History of renal stone 02/24/2022   Umbilical hernia without obstruction and without gangrene 02/24/2022   Tendinitis of left triceps 01/21/2022   Renal stone 05/17/2021   Sepsis with acute organ dysfunction (HCC) 04/19/2021   Kidney stone 04/19/2021   Renal mass 04/19/2021   Splenomegaly 04/19/2021   Sepsis (HCC) 04/14/2021   Acute lower UTI 04/14/2021   Bilateral hand numbness 02/14/2021   Localized swelling on right hand 02/14/2021   Sleep disturbance 02/14/2021  Snoring 02/14/2021   Erectile dysfunction 02/14/2021   Need for influenza vaccination 02/14/2021   Impingement syndrome of left shoulder 06/20/2020   Primary osteoarthritis of first carpometacarpal joint of left hand 06/20/2020   Encounter for health maintenance examination in adult 02/17/2020   Screening for prostate cancer 02/17/2020   History of COVID-19 02/17/2020   Toenail deformity 02/17/2020   Neuropathy of foot, right 02/17/2020   Chronic pain of both shoulders 04/22/2019   Decreased range of motion of shoulder 04/22/2019   Degenerative arthritis of thumb, left 04/22/2019   Right inguinal hernia 01/28/2019    Aortic valve calcification 01/28/2019   Vaccine counseling 06/22/2017   Family history of heart disease 12/21/2013   Family history of colon cancer 12/21/2013   Nephrolithiasis 09/28/2009    PCP: Jac Canavan, PA-C  REFERRING PROVIDER: Cristie Hem, PA-C  REFERRING DIAG:  Diagnosis  [phone] (ICD-10-CM) - S/P arthroscopy of right shoulder    THERAPY DIAG:  Acute pain of right shoulder  Stiffness of right shoulder, not elsewhere classified  Localized edema  Muscle weakness (generalized)  Rationale for Evaluation and Treatment: Rehabilitation  ONSET DATE: October 09, 2022 surgery  SUBJECTIVE:                                                                                                                                                                                      SUBJECTIVE STATEMENT: Shoulder is still sore, but doing okay  Hand dominance: Right  PERTINENT HISTORY: OA left 1st CMC joint, right foot surgery, significant kidney history  PAIN:  NPRS scale: 0/10 upon arrival.  Pain location: right shoulder Pain description: Achy with occasional sharp, stabbing Aggravating factors: Most right shoulder use Relieving factors: Oxycodone day and night  PRECAUTIONS: Other: Rotator cuff repair 10/09/2022  WEIGHT BEARING RESTRICTIONS: Yes None on right upper extremity  FALLS:  Has patient fallen in last 6 months? 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 Moderate distal infraspinatus tendinosis  with articular and bursal sided fraying. Mild distal subscapularis tendinosis. No significant muscle atrophy.   Mild intra-articular long head biceps tendinosis.   Mild glenohumeral and AC joint osteoarthritis.  PATIENT SURVEYS:  12/10/2022:  FOTO update:  61  10/24/22: FOTO 32 (Goal 68 in 18 visits)  COGNITION: 10/24/2022 Overall cognitive status: Within functional limits for tasks assessed     SENSATION: 10/24/2022 Ithan has no complaints of peripheral pain or paresthesias related to his right shoulder surgery  POSTURE: 10/24/2022 Mild forward head, internally rotated and protracted shoulders  UPPER EXTREMITY ROM:   Passive ROM Left/Right 10/24/2022 Right 10/30/22 Right 11/17/22 Right 11/24/22 Right 12/10/2022 Right 12/25/22  Shoulder flexion 135/90 P: 100 P: 134 A: 106 deg, no pain, noted shrug AROM in supine 132 A: 113  (Sitting)  Shoulder extension        Shoulder abduction  P: 104 P: 120 A: 89 deg no pain, shrug and trunk lean noted  AROM in supine 108 A: 85  (Sitting)  Shoulder adduction        Shoulder internal rotation 70/35    60 AROM in supine abduction 45 deg A: 55  (Sitting)  Shoulder external rotation 85/10 P: 10 P: 30  40 AROM in supine abduction 45 deg FIR:  Rt iliac crest  (Blank rows = not tested)  UPPER EXTREMITY Strength:  MMT Left/Right 10/24/2022 Right 12/10/2022 Left 12/10/2022  Shoulder flexion  4/5 5/5  Shoulder extension     Shoulder abduction  3+/5 5/5  Shoulder adduction     Shoulder internal rotation Deferred 4+/5 5/5  Shoulder external rotation Deferred 4/5 5/5  (Blank rows = not tested) Strength testing was deferred today secondary to being 15 days status post right rotator cuff repair   BFR settings: Cuff size 2 Supine, sitting and standing LOP 150 mmHg (exercised at 75 mmHg)                   TODAY'S TREATMENT: 12/25/22 TherEx BATCA rows 25# 3x10 BATCA lat pull downs 15# 3x10 ROM measurements - see above for details Seated AA  shoulder flexion 2x10 with 2# bar Standing AA abduction with 2# bar 2x10 Standing AA ER with 2# bar 2x10 Standing AA extension with 2# bar 2x10 Standing AA IR with 2# bar 2x10 Standing Rt shoulder flexion 2x10; 3#; hold at 90 deg Standing Rt shoulder scaption 2x10; 3# with hold at 90 deg Standing shoulder flexion wall slides x 10 reps; 10 sec hold Standing shoulder abduction wall slides x 10 reps; 10 sec hold Standing ER stretch 10 x 10 sec hold   12/22/22 TherEx UBE L5 x 6 min (3 min each direction) Doorway ER stretch 5x20 sec hold, Rt  With BFR (LOP 170 mmHg, exercises at 85 mmHg) Wall ladder shoulder flexion and scaption x 10 reps with 5 sec hold at end range Standing shoulder flexion to 90 deg (avoiding shrug) Rt 3 x 15; 2# Standing shoulder abduction to 90 deg (avoiding shrug) Rt 3x15; 2# ER L3 band on Rt; 3x15 IR L3 band on Rt; 3x15    12/16/22 TherEx UBE L5 x 6 min (3 min each direction)  With BFR Wall ladder shoulder flexion x 10 reps with 5 sec hold at end range Shoulder flexion 2x10 with 3# bar Shoulder abduction AA concentric; A eccentric 2x10 with 3# bar Standing shoulder flexion to 90 deg (avoiding shrug) Rt 3 x 15; 2# Standing shoulder abduction to 90 deg (avoiding shrug) Rt 3 x 15   Manual Supine Rt shoulder inferior glides in flexion, scaption, abduction g4, PROM Rt shoulder all directions to tolerance  12/12/2022 TherEx Pulley flexion, scaption with outstretched arm with eccentric lowering focus 3 mins each way   With BFR: Sidelying Rt shoulder abduction 1 lb 3 x 15 Sidelying  Rt shoulder ER c arm at side 3 x 15 2 lb  Standing shoulder flexion to 90 deg (avoiding shrug) bilateral 3 x 15  Standing shoulder abduction to 90 deg (avoiding shrug) bilateral 3 x 15   Without: Wall push up with SA press hold 3 sec 2 x 10    Manual Supine Rt shoulder inferior glides in flexion, scaption, abduction g4, Mobilization c movement posterior Rt GH glide with passive  ER mobility  12/10/2022 TherEx UBE Fwd/back 3 mins each way lvl 3.0  Standing shoulder flexion to 90 deg (avoiding shrug) bilateral 1 lb 2 x 15  Standing shoulder abduction to 90 deg (avoiding shrug) bilateral 1 lb 2 x 10   Manual Supine Rt shoulder inferior glides in flexion, scaption, abduction g4, Mobilization c movement posterior Rt GH glide with passive ER mobility   11/24/22 Therapeutic Exercise: Seated pulleys flexion/scaption 2 min each way  Gravity reduced shoulder flexion AROM (incline on table) 2x8 cues for reduced UT elevation Incline chest press with PVC pipe x8 Supine chest press with PVC pipe 2x15 Red band row x12, green band row x12 cues for elbow positioning and scapular mechanics Shoulder extension red band x10 cues for setup, green band x10 Sidelying ER 2x15 with towel prop and cues for positioning   PATIENT EDUCATION: 12/10/2022 Education details: HEP update:  Person educated: Patient Education method: Programmer, multimedia, Facilities manager, Verbal/visual  cues, and Handouts Education comprehension: verbalized understanding, returned demonstration, verbal cues required  HOME EXERCISE PROGRAM: Access Code: [email or web address] URL: [email or web address] Date: 12/10/2022 Prepared by: Chyrel Masson  Exercises - Standing Scapular Retraction  - 5 x daily - 7 x weekly - 1 sets - 5 reps - 5 second hold - Single Arm Doorway Pec Stretch at 90 Degrees Abduction  - 2-3 x daily - 7 x weekly - 1 sets - 3-5 reps - 15-30 hold - Standing shoulder flexion wall slides  - 2-3 x daily - 7 x weekly - 1 sets - 10 reps - 5 hold - Supine Shoulder Flexion Extension Full Range AROM  - 1-2 x daily - 7 x weekly - 2-3 sets - 10-20 reps - Sidelying Shoulder Abduction Palm Forward  - 1 x daily - 7 x weekly - 2-3 sets - 10-20 reps - Sidelying Shoulder External Rotation (Mirrored)  - 1 x daily - 7 x weekly - 2-3 sets - 10-20 reps - Standing Bilateral Low Shoulder Row with Anchored Resistance  - 1-2 x  daily - 7 x weekly - 2-3 sets - 10-15 reps - Shoulder Extension with Resistance  - 1-2 x daily - 7 x weekly - 1-2 sets - 10-15 reps - Standing Shoulder Flexion to 90 Degrees with Dumbbells  - 1 x daily - 7 x weekly - 2-3 sets - 10-15 reps - Shoulder Abduction with Dumbbells - Thumbs Up  - 1 x daily - 7 x weekly - 2-3 sets - 10-15 reps  ASSESSMENT:  CLINICAL IMPRESSION: Pt continues to have difficulty with AROM in standing due to weakness and continued tightness.  Session focused on maximizing ROM and strengthening as tolerated.  Recommend continuation of PT at least 1x/wk to maximize function and ensure safe return to work.  OBJECTIVE IMPAIRMENTS: decreased activity tolerance, decreased endurance, decreased knowledge of condition, decreased ROM, decreased strength, decreased safety awareness, increased edema, impaired perceived functional ability, increased muscle spasms, impaired UE functional use, postural dysfunction, and pain.   ACTIVITY LIMITATIONS: carrying, lifting, sleeping, toileting, dressing, reach over head, and hygiene/grooming  PARTICIPATION  LIMITATIONS: meal prep, cleaning, laundry, driving, shopping, community activity, and occupation  PERSONAL FACTORS: OA left 1st CMC joint, right foot surgery, significant kidney history are also affecting patient's functional outcome.   REHAB POTENTIAL: Good  CLINICAL DECISION MAKING: Stable/uncomplicated  EVALUATION COMPLEXITY: Low   GOALS: Goals reviewed with patient? Yes  SHORT TERM GOALS: Target date: 11/21/2022  [place] will be independent with his day 1 HEP Baseline: Started 10/24/2022 Goal status: MET 11/17/22  2.  Improve right shoulder passive range of motion for flexion to 150 degrees; internal rotation 50 degrees and external rotation to 70 degrees Goal status: partially met  11/19/22  3.  [place] will be able to sleep at least 4 hours uninterrupted Baseline: 1 to 2 hours Goal status: MET 11/17/22   LONG TERM GOALS:  Target date: 01/02/2023  Improve FOTO to 68 in 18 visits Baseline: 32 Goal status: on going 12/10/2022  2.  Improve right should pain to consistently 0-3/10 on the VAS. Baseline: 6-10 out of 10 Goal status on going 12/10/2022  3.  Improve right shoulder AROM for flexion to 170 degrees; ER to 90 degrees; IR to 60 degrees and horizontal adduction to 40 degrees Baseline: See above Goal status: on going 12/10/2022  4.  Improve right shoulder strength for ER to at least 15 pounds and IR to at least 30 pounds.  60% strength right vs left is expected 12 weeks post-surgery with 90% or better strength 9-12 months post-surgery. Baseline: Deferred secondary to surgery 15 days ago at evaluation Goal status: on going 12/10/2022  5.  Ismael will be independent with his long-term maintenance HEP at [place] Baseline: Started 10/24/2022 Goal status: on going 12/10/2022   PLAN:  PT FREQUENCY: 1-2x/week  PT DURATION: 10 weeks  PLANNED INTERVENTIONS: Therapeutic exercises, Therapeutic activity, Neuromuscular re-education, Patient/Family education, Self Care, Joint mobilization, Cryotherapy, Vasopneumatic device, and Manual therapy  PLAN FOR NEXT SESSION: Continue to maximize ROM, work on strengthening    Clarita Crane, PT, DPT 12/25/22 10:20 AM

## 2022-12-26 ENCOUNTER — Telehealth: Payer: Self-pay | Admitting: Orthopaedic Surgery

## 2022-12-26 NOTE — Telephone Encounter (Signed)
[employer] forms received. To [employer]. 

## 2022-12-30 ENCOUNTER — Ambulatory Visit (INDEPENDENT_AMBULATORY_CARE_PROVIDER_SITE_OTHER): Payer: BC Managed Care – PPO | Admitting: Physical Therapy

## 2022-12-30 ENCOUNTER — Encounter: Payer: Self-pay | Admitting: Physical Therapy

## 2022-12-30 DIAGNOSIS — M6281 Muscle weakness (generalized): Secondary | ICD-10-CM | POA: Diagnosis not present

## 2022-12-30 DIAGNOSIS — M25511 Pain in right shoulder: Secondary | ICD-10-CM | POA: Diagnosis not present

## 2022-12-30 DIAGNOSIS — R600 Localized edema: Secondary | ICD-10-CM

## 2022-12-30 DIAGNOSIS — M25611 Stiffness of right shoulder, not elsewhere classified: Secondary | ICD-10-CM

## 2022-12-30 NOTE — Therapy (Signed)
OUTPATIENT PHYSICAL THERAPY TREATMENT   Patient Name: Mitchell Hughes MRN: [id number] DOB:August 16, 1965, 57 y.o., male Today's Date: 12/30/2022  END OF SESSION:  PT End of Session - 12/30/22 0918     Visit Number 13    Number of Visits 18    Date for PT Re-Evaluation 01/02/23    Authorization Type [employer] 20% coinsurance    Authorization - Number of Visits 60    Progress Note Due on Visit 18    PT Start Time 0920    PT Stop Time 1000    PT Time Calculation (min) 40 min    Activity Tolerance Patient tolerated treatment well    Behavior During Therapy [facility] for tasks assessed/performed                  Past Medical History:  Diagnosis Date   Arthritis    Erectile dysfunction    Family history of colon cancer    sister in 71s   Family history of heart disease 2011   baseline cardiac eval 2011 with Dr. Royann Shivers   H/O echocardiogram 2015   Dr. Donnie Aho   H/O echocardiogram 02/2019   History of exercise stress test    04/2014 Dr. Donnie Aho, prior 2011 normal Bruce treadmill stress test, Dr. Rennis Golden   History of kidney stones    LOW BACK PAIN, ACUTE 09/26/2009   OSA (obstructive sleep apnea) 01/2022   has cpap   TOBACCO USE, QUIT 09/26/2009   Wears glasses    Past Surgical History:  Procedure Laterality Date   COLONOSCOPY  08/2017   tubular adenoma, repeat 5 years; Dr. Ileene Patrick   FOOT SURGERY     bone spur, right; Dr. Rolland Bimler HERNIA REPAIR Bilateral 04/11/2022   Procedure: LAPAROSCOPIC BILATERAL INGUINAL HERNIA REPAIR with MESH;  Surgeon: Karie Soda, MD;  Location: [facility];  Service: General;  Laterality: Bilateral;  LOCAL   IR NEPHROSTOMY PLACEMENT RIGHT  04/14/2021   NEPHROLITHOTOMY Right 05/17/2021   Procedure: NEPHROLITHOTOMY PERCUTANEOUS insertion double j stent;  Surgeon: Sebastian Ache, MD;  Location: [facility];  Service: Urology;  Laterality: Right;   ROBOT ASSISTED LAPAROSCOPIC NEPHRECTOMY Left 07/26/2021   Procedure: XI ROBOTIC ASSISTED LAPAROSCOPIC  NEPHRECTOMY;  Surgeon: Sebastian Ache, MD;  Location: [facility];  Service: Urology;  Laterality: Left;  3 HRS   UMBILICAL HERNIA REPAIR N/A 04/11/2022   Procedure: HERNIA REPAIR UMBILICAL ADULT;  Surgeon: Karie Soda, MD;  Location: [facility];  Service: General;  Laterality: N/A;  PRIMARY UMBILICAL HERNIA REPAIR   WISDOM TOOTH EXTRACTION     WISDOM TOOTH EXTRACTION     Patient Active Problem List   Diagnosis Date Noted   Nontraumatic complete tear of right rotator cuff 07/22/2022   Impingement syndrome of right shoulder 07/22/2022   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   Benign prostatic hyperplasia with weak urinary stream 02/24/2022   Urinary hesitancy 02/24/2022   COVID-19 vaccine administered 02/24/2022   Screening for lipid disorders 02/24/2022   History of renal cell cancer 02/24/2022   History of renal stone 02/24/2022   Umbilical hernia without obstruction and without gangrene 02/24/2022   Tendinitis of left triceps 01/21/2022   Renal stone 05/17/2021   Sepsis with acute organ dysfunction (HCC) 04/19/2021   Kidney stone 04/19/2021   Renal mass 04/19/2021   Splenomegaly 04/19/2021   Sepsis (HCC) 04/14/2021   Acute lower UTI 04/14/2021   Bilateral hand numbness 02/14/2021   Localized swelling on right hand 02/14/2021   Sleep disturbance  02/14/2021   Snoring 02/14/2021   Erectile dysfunction 02/14/2021   Need for influenza vaccination 02/14/2021   Impingement syndrome of left shoulder 06/20/2020   Primary osteoarthritis of first carpometacarpal joint of left hand 06/20/2020   Encounter for health maintenance examination in adult 02/17/2020   Screening for prostate cancer 02/17/2020   History of COVID-19 02/17/2020   Toenail deformity 02/17/2020   Neuropathy of foot, right 02/17/2020   Chronic pain of both shoulders 04/22/2019   Decreased range of motion of shoulder 04/22/2019   Degenerative arthritis of thumb, left 04/22/2019   Right inguinal hernia 01/28/2019    Aortic valve calcification 01/28/2019   Vaccine counseling 06/22/2017   Family history of heart disease 12/21/2013   Family history of colon cancer 12/21/2013   Nephrolithiasis 09/28/2009    PCP: Jac Canavan, PA-C  REFERRING PROVIDER: Cristie Hem, PA-C  REFERRING DIAG:  Diagnosis  [phone] (ICD-10-CM) - S/P arthroscopy of right shoulder    THERAPY DIAG:  Acute pain of right shoulder  Stiffness of right shoulder, not elsewhere classified  Localized edema  Muscle weakness (generalized)  Rationale for Evaluation and Treatment: Rehabilitation  ONSET DATE: October 09, 2022 surgery  SUBJECTIVE:                                                                                                                                                                                      SUBJECTIVE STATEMENT: Overall pleased with progress; just needs to work on strengthening overall   Hand dominance: Right  PERTINENT HISTORY: OA left 1st CMC joint, right foot surgery, significant kidney history  PAIN:  NPRS scale: 0/10 upon arrival.  Pain location: right shoulder Pain description: Achy with occasional sharp, stabbing Aggravating factors: Most right shoulder use Relieving factors: Oxycodone day and night  PRECAUTIONS: Other: Rotator cuff repair 10/09/2022  WEIGHT BEARING RESTRICTIONS: Yes None on right upper extremity  FALLS:  Has patient fallen in last 6 months? 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 Moderate  distal infraspinatus tendinosis with articular and bursal sided fraying. Mild distal subscapularis tendinosis. No significant muscle atrophy.   Mild intra-articular long head biceps tendinosis.   Mild glenohumeral and AC joint osteoarthritis.  PATIENT SURVEYS:  12/10/2022:  FOTO update:  61  10/24/22:  FOTO 32 (Goal 68 in 18 visits)  COGNITION: 10/24/2022 Overall cognitive status: Within functional limits for tasks assessed     SENSATION: 10/24/2022 Jyheim has no complaints of peripheral pain or paresthesias related to his right shoulder surgery  POSTURE: 10/24/2022 Mild forward head, internally rotated and protracted shoulders  UPPER EXTREMITY ROM:   Passive ROM Left/Right 10/24/2022 Right 10/30/22 Right 11/17/22 Right 11/24/22 Right 12/10/2022 Right 12/25/22  Shoulder flexion 135/90 P: 100 P: 134 A: 106 deg, no pain, noted shrug AROM in supine 132 A: 113  (Sitting)  Shoulder extension        Shoulder abduction  P: 104 P: 120 A: 89 deg no pain, shrug and trunk lean noted  AROM in supine 108 A: 85  (Sitting)  Shoulder adduction        Shoulder internal rotation 70/35    60 AROM in supine abduction 45 deg A: 55  (Sitting)  Shoulder external rotation 85/10 P: 10 P: 30  40 AROM in supine abduction 45 deg FIR:  Rt iliac crest  (Blank rows = not tested)  UPPER EXTREMITY Strength:  MMT Left/Right 10/24/2022 Right 12/10/2022 Left 12/10/2022  Shoulder flexion  4/5 5/5  Shoulder extension     Shoulder abduction  3+/5 5/5  Shoulder adduction     Shoulder internal rotation Deferred 4+/5 5/5  Shoulder external rotation Deferred 4/5 5/5  (Blank rows = not tested) Strength testing was deferred today secondary to being 15 days status post right rotator cuff repair   BFR settings: Cuff size 2 Supine, sitting and standing LOP 150 mmHg (exercised at 75 mmHg)                   TODAY'S TREATMENT: 12/30/22 TherEx UBE L5 x 6 min (3 min each direction) BATCA rows 25# 3x10 BATCA lat pull  downs 15# 3x10 Bicep curls with overhead press 5# bil; 3x10 Standing shoulder flexion 5# bil; 3x10 Standing bil ER and scapular retraction with 5# bil; 3x10 High row L4 band 3x10 Floor to waist height box lift (18#; no weight added) 2x10 reps Standing AA extension with 2# bar 2x10 Standing AA IR with 2# bar 2x10  12/25/22 TherEx BATCA rows 25# 3x10 BATCA lat pull downs 15# 3x10 ROM measurements - see above for details Seated AA shoulder flexion 2x10 with 2# bar Standing AA abduction with 2# bar 2x10 Standing AA ER with 2# bar 2x10 Standing AA extension with 2# bar 2x10 Standing AA IR with 2# bar 2x10 Standing Rt shoulder flexion 2x10; 3#; hold at 90 deg Standing Rt shoulder scaption 2x10; 3# with hold at 90 deg Standing shoulder flexion wall slides x 10 reps; 10 sec hold Standing shoulder abduction wall slides x 10 reps; 10 sec hold Standing ER stretch 10 x 10 sec hold   12/22/22 TherEx UBE L5 x 6 min (3 min each direction) Doorway ER stretch 5x20 sec hold, Rt  With BFR (LOP 170 mmHg, exercises at 85 mmHg) Wall ladder shoulder flexion and scaption x 10 reps with 5 sec hold at end range Standing shoulder flexion to 90 deg (avoiding shrug) Rt 3 x 15; 2# Standing shoulder abduction to 90 deg (avoiding shrug) Rt 3x15; 2# ER L3 band on Rt; 3x15 IR L3 band on Rt; 3x15    12/16/22 TherEx UBE L5 x 6 min (3 min each direction)  With BFR Wall ladder shoulder flexion x 10 reps with 5 sec hold at end range Shoulder flexion 2x10 with 3# bar Shoulder  abduction AA concentric; A eccentric 2x10 with 3# bar Standing shoulder flexion to 90 deg (avoiding shrug) Rt 3 x 15; 2# Standing shoulder abduction to 90 deg (avoiding shrug) Rt 3 x 15   Manual Supine Rt shoulder inferior glides in flexion, scaption, abduction g4, PROM Rt shoulder all directions to tolerance  12/12/2022 TherEx Pulley flexion, scaption with outstretched arm with eccentric lowering focus 3 mins each way   With  BFR: Sidelying Rt shoulder abduction 1 lb 3 x 15 Sidelying Rt shoulder ER c arm at side 3 x 15 2 lb  Standing shoulder flexion to 90 deg (avoiding shrug) bilateral 3 x 15  Standing shoulder abduction to 90 deg (avoiding shrug) bilateral 3 x 15   Without: Wall push up with SA press hold 3 sec 2 x 10    Manual Supine Rt shoulder inferior glides in flexion, scaption, abduction g4, Mobilization c movement posterior Rt GH glide with passive ER mobility  12/10/2022 TherEx UBE Fwd/back 3 mins each way lvl 3.0  Standing shoulder flexion to 90 deg (avoiding shrug) bilateral 1 lb 2 x 15  Standing shoulder abduction to 90 deg (avoiding shrug) bilateral 1 lb 2 x 10   Manual Supine Rt shoulder inferior glides in flexion, scaption, abduction g4, Mobilization c movement posterior Rt GH glide with passive ER mobility   11/24/22 Therapeutic Exercise: Seated pulleys flexion/scaption 2 min each way  Gravity reduced shoulder flexion AROM (incline on table) 2x8 cues for reduced UT elevation Incline chest press with PVC pipe x8 Supine chest press with PVC pipe 2x15 Red band row x12, green band row x12 cues for elbow positioning and scapular mechanics Shoulder extension red band x10 cues for setup, green band x10 Sidelying ER 2x15 with towel prop and cues for positioning   PATIENT EDUCATION: 12/10/2022 Education details: HEP update:  Person educated: Patient Education method: Programmer, multimedia, Facilities manager, Verbal/visual  cues, and Handouts Education comprehension: verbalized understanding, returned demonstration, verbal cues required  HOME EXERCISE PROGRAM: Access Code: [email or web address] URL: [email or web address] Date: 12/10/2022 Prepared by: Chyrel Masson  Exercises - Standing Scapular Retraction  - 5 x daily - 7 x weekly - 1 sets - 5 reps - 5 second hold - Single Arm Doorway Pec Stretch at 90 Degrees Abduction  - 2-3 x daily - 7 x weekly - 1 sets - 3-5 reps - 15-30 hold - Standing  shoulder flexion wall slides  - 2-3 x daily - 7 x weekly - 1 sets - 10 reps - 5 hold - Supine Shoulder Flexion Extension Full Range AROM  - 1-2 x daily - 7 x weekly - 2-3 sets - 10-20 reps - Sidelying Shoulder Abduction Palm Forward  - 1 x daily - 7 x weekly - 2-3 sets - 10-20 reps - Sidelying Shoulder External Rotation (Mirrored)  - 1 x daily - 7 x weekly - 2-3 sets - 10-20 reps - Standing Bilateral Low Shoulder Row with Anchored Resistance  - 1-2 x daily - 7 x weekly - 2-3 sets - 10-15 reps - Shoulder Extension with Resistance  - 1-2 x daily - 7 x weekly - 1-2 sets - 10-15 reps - Standing Shoulder Flexion to 90 Degrees with Dumbbells  - 1 x daily - 7 x weekly - 2-3 sets - 10-15 reps - Shoulder Abduction with Dumbbells - Thumbs Up  - 1 x daily - 7 x weekly - 2-3 sets - 10-15 reps  ASSESSMENT:  CLINICAL IMPRESSION: Pt tolerated session well today and able to progress  strengthening exercises with expected muscle fatigue and slight discomfort.  Did initiate some work conditioning in preparation for return to work.  Will continue to benefit from PT to maximize function.  OBJECTIVE IMPAIRMENTS: decreased activity tolerance, decreased endurance, decreased knowledge of condition, decreased ROM, decreased strength, decreased safety awareness, increased edema, impaired perceived functional ability, increased muscle spasms, impaired UE functional use, postural dysfunction, and pain.   ACTIVITY LIMITATIONS: carrying, lifting, sleeping, toileting, dressing, reach over head, and hygiene/grooming  PARTICIPATION LIMITATIONS: meal prep, cleaning, laundry, driving, shopping, community activity, and occupation  PERSONAL FACTORS: OA left 1st CMC joint, right foot surgery, significant kidney history are also affecting patient's functional outcome.   REHAB POTENTIAL: Good  CLINICAL DECISION MAKING: Stable/uncomplicated  EVALUATION COMPLEXITY: Low   GOALS: Goals reviewed with patient? Yes  SHORT TERM  GOALS: Target date: 11/21/2022  Armeen will be independent with his day 1 HEP Baseline: Started 10/24/2022 Goal status: MET 11/17/22  2.  Improve right shoulder passive range of motion for flexion to 150 degrees; internal rotation 50 degrees and external rotation to 70 degrees Goal status: partially met  11/19/22  3.  Martez will be able to sleep at least 4 hours uninterrupted Baseline: 1 to 2 hours Goal status: MET 11/17/22   LONG TERM GOALS: Target date: 01/02/2023  Improve FOTO to 68 in 18 visits Baseline: 32 Goal status: on going 12/10/2022  2.  Improve right should pain to consistently 0-3/10 on the VAS. Baseline: 6-10 out of 10 Goal status on going 12/10/2022  3.  Improve right shoulder AROM for flexion to 170 degrees; ER to 90 degrees; IR to 60 degrees and horizontal adduction to 40 degrees Baseline: See above Goal status: on going 12/10/2022  4.  Improve right shoulder strength for ER to at least 15 pounds and IR to at least 30 pounds.  60% strength right vs left is expected 12 weeks post-surgery with 90% or better strength 9-12 months post-surgery. Baseline: Deferred secondary to surgery 15 days ago at evaluation Goal status: on going 12/10/2022  5.  Lonny will be independent with his long-term maintenance HEP at [place] Baseline: Started 10/24/2022 Goal status: on going 12/10/2022   PLAN:  PT FREQUENCY: 1-2x/week  PT DURATION: 10 weeks  PLANNED INTERVENTIONS: Therapeutic exercises, Therapeutic activity, Neuromuscular re-education, Patient/Family education, Self Care, Joint mobilization, Cryotherapy, Vasopneumatic device, and Manual therapy  PLAN FOR NEXT SESSION: test shoulder strength based on work test,  Continue to maximize ROM, work on strengthening    Clarita Crane, PT, DPT 12/30/22 11:58 AM

## 2023-01-06 ENCOUNTER — Encounter: Payer: BC Managed Care – PPO | Admitting: Physical Therapy

## 2023-01-09 ENCOUNTER — Encounter: Payer: Self-pay | Admitting: Rehabilitative and Restorative Service Providers"

## 2023-01-09 ENCOUNTER — Ambulatory Visit (INDEPENDENT_AMBULATORY_CARE_PROVIDER_SITE_OTHER): Payer: BC Managed Care – PPO | Admitting: Rehabilitative and Restorative Service Providers"

## 2023-01-09 DIAGNOSIS — M6281 Muscle weakness (generalized): Secondary | ICD-10-CM | POA: Diagnosis not present

## 2023-01-09 DIAGNOSIS — M25611 Stiffness of right shoulder, not elsewhere classified: Secondary | ICD-10-CM | POA: Diagnosis not present

## 2023-01-09 DIAGNOSIS — R600 Localized edema: Secondary | ICD-10-CM

## 2023-01-09 DIAGNOSIS — M25511 Pain in right shoulder: Secondary | ICD-10-CM | POA: Diagnosis not present

## 2023-01-09 NOTE — Therapy (Signed)
OUTPATIENT PHYSICAL THERAPY TREATMENT / RECERT/ PROGRESS NOTE   Patient Name: Mitchell Hughes MRN: [id number] DOB:01/10/1966, 57 y.o., male Today's Date: 01/09/2023  Progress Note Reporting Period 10/24/2022 to 01/09/2023  See note below for Objective Data and Assessment of Progress/Goals.   END OF SESSION:  PT End of Session - 01/09/23 0837     Visit Number 14    Number of Visits 19    Date for PT Re-Evaluation 02/20/23    Authorization Type [employer] 20% coinsurance    Authorization - Number of Visits 60    Progress Note Due on Visit 19    PT Start Time 0838    PT Stop Time 0917    PT Time Calculation (min) 39 min    Activity Tolerance Patient tolerated treatment well    Behavior During Therapy [facility] for tasks assessed/performed                   Past Medical History:  Diagnosis Date   Arthritis    Erectile dysfunction    Family history of colon cancer    sister in 19s   Family history of heart disease 2011   baseline cardiac eval 2011 with Dr. Royann Shivers   H/O echocardiogram 2015   Dr. Donnie Aho   H/O echocardiogram 02/2019   History of exercise stress test    04/2014 Dr. Donnie Aho, prior 2011 normal Bruce treadmill stress test, Dr. Rennis Golden   History of kidney stones    LOW BACK PAIN, ACUTE 09/26/2009   OSA (obstructive sleep apnea) 01/2022   has cpap   TOBACCO USE, QUIT 09/26/2009   Wears glasses    Past Surgical History:  Procedure Laterality Date   COLONOSCOPY  08/2017   tubular adenoma, repeat 5 years; Dr. Ileene Patrick   FOOT SURGERY     bone spur, right; Dr. Rolland Bimler HERNIA REPAIR Bilateral 04/11/2022   Procedure: LAPAROSCOPIC BILATERAL INGUINAL HERNIA REPAIR with MESH;  Surgeon: Karie Soda, MD;  Location: [facility];  Service: General;  Laterality: Bilateral;  LOCAL   IR NEPHROSTOMY PLACEMENT RIGHT  04/14/2021   NEPHROLITHOTOMY Right 05/17/2021   Procedure: NEPHROLITHOTOMY PERCUTANEOUS insertion double j stent;  Surgeon: Sebastian Ache, MD;  Location:  [facility];  Service: Urology;  Laterality: Right;   ROBOT ASSISTED LAPAROSCOPIC NEPHRECTOMY Left 07/26/2021   Procedure: XI ROBOTIC ASSISTED LAPAROSCOPIC NEPHRECTOMY;  Surgeon: Sebastian Ache, MD;  Location: [facility];  Service: Urology;  Laterality: Left;  3 HRS   UMBILICAL HERNIA REPAIR N/A 04/11/2022   Procedure: HERNIA REPAIR UMBILICAL ADULT;  Surgeon: Karie Soda, MD;  Location: [facility];  Service: General;  Laterality: N/A;  PRIMARY UMBILICAL HERNIA REPAIR   WISDOM TOOTH EXTRACTION     WISDOM TOOTH EXTRACTION     Patient Active Problem List   Diagnosis Date Noted   Nontraumatic complete tear of right rotator cuff 07/22/2022   Impingement syndrome of right shoulder 07/22/2022   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   Benign prostatic hyperplasia with weak urinary stream 02/24/2022   Urinary hesitancy 02/24/2022   COVID-19 vaccine administered 02/24/2022   Screening for lipid disorders 02/24/2022   History of renal cell cancer 02/24/2022   History of renal stone 02/24/2022   Umbilical hernia without obstruction and without gangrene 02/24/2022   Tendinitis of left triceps 01/21/2022   Renal stone 05/17/2021   Sepsis with acute organ dysfunction (HCC) 04/19/2021   Kidney stone 04/19/2021   Renal mass 04/19/2021   Splenomegaly 04/19/2021   Sepsis (HCC)  04/14/2021   Acute lower UTI 04/14/2021   Bilateral hand numbness 02/14/2021   Localized swelling on right hand 02/14/2021   Sleep disturbance 02/14/2021   Snoring 02/14/2021   Erectile dysfunction 02/14/2021   Need for influenza vaccination 02/14/2021   Impingement syndrome of left shoulder 06/20/2020   Primary osteoarthritis of first carpometacarpal joint of left hand 06/20/2020   Encounter for health maintenance examination in adult 02/17/2020   Screening for prostate cancer 02/17/2020   History of COVID-19 02/17/2020   Toenail deformity 02/17/2020   Neuropathy of foot, right 02/17/2020   Chronic pain of both shoulders  04/22/2019   Decreased range of motion of shoulder 04/22/2019   Degenerative arthritis of thumb, left 04/22/2019   Right inguinal hernia 01/28/2019   Aortic valve calcification 01/28/2019   Vaccine counseling 06/22/2017   Family history of heart disease 12/21/2013   Family history of colon cancer 12/21/2013   Nephrolithiasis 09/28/2009    PCP: Jac Canavan, PA-C  REFERRING PROVIDER: Cristie Hem, PA-C  REFERRING DIAG:  Diagnosis  [phone] (ICD-10-CM) - S/P arthroscopy of right shoulder    THERAPY DIAG:  Acute pain of right shoulder  Stiffness of right shoulder, not elsewhere classified  Localized edema  Muscle weakness (generalized)  Rationale for Evaluation and Treatment: Rehabilitation  ONSET DATE: October 09, 2022 surgery  SUBJECTIVE:                                                                                                                                                                                      SUBJECTIVE STATEMENT: Pt stated 90 % overall improvement to this point.  Reported some soreness that comes and goes.  Pt indicated daily non work life back to normal.     Hand dominance: Right  PERTINENT HISTORY: OA left 1st CMC joint, right foot surgery, significant kidney history  PAIN:  NPRS scale: pain at worst in last 2 weeks 6/10  Pain location: right shoulder Pain description: Achy with occasional sharp, stabbing Aggravating factors: Most right shoulder use Relieving factors: Oxycodone day and night  PRECAUTIONS: Other: Rotator cuff repair 10/09/2022  WEIGHT BEARING RESTRICTIONS: Yes None on right upper extremity  FALLS:  Has patient fallen in last 6 months? 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 Moderate distal infraspinatus tendinosis with articular and bursal sided fraying. Mild distal subscapularis tendinosis. No significant muscle atrophy.   Mild intra-articular long head biceps tendinosis.   Mild glenohumeral and AC joint osteoarthritis.  PATIENT SURVEYS:  01/09/2023: FOTO update:  72  12/10/2022:  FOTO update:  61  10/24/22: FOTO 32 (Goal 68 in 18 visits)  COGNITION: 10/24/2022 Overall cognitive status: Within functional limits for tasks assessed     SENSATION: 10/24/2022 Kwamane has no complaints of peripheral pain or paresthesias related to his right shoulder surgery  POSTURE: 10/24/2022 Mild forward head, internally rotated and protracted shoulders  UPPER EXTREMITY ROM:   Passive ROM Left/Right 10/24/2022 Right 10/30/22 Right 11/17/22 Right 11/24/22 Right 12/10/2022 Right 12/25/22 Right 01/09/2023 AROM in supine  Shoulder flexion 135/90 P: 100 P: 134 A: 106 deg, no pain, noted shrug AROM in supine 132 A: 113  (Sitting) 140  Shoulder extension         Shoulder abduction  P: 104 P: 120 A: 89 deg no pain, shrug and trunk lean noted  AROM in supine 108 A: 85  (Sitting) 100  Shoulder adduction         Shoulder internal rotation 70/35    60 AROM in supine abduction 45 deg A: 55  (Sitting) 65 AROM in supine abduction 45 deg  Shoulder external rotation 85/10 P: 10 P: 30  40 AROM in supine abduction 45 deg FIR:  Rt iliac crest 40 AROM in supine abduction 45 deg  (Blank rows = not tested)  UPPER EXTREMITY Strength:  MMT Left/Right 10/24/2022 Right 12/10/2022 Left 12/10/2022 Right 01/09/2023  Shoulder flexion  4/5 5/5 4+/5  Shoulder extension      Shoulder abduction  3+/5 5/5 4+/5  Shoulder adduction      Shoulder internal rotation Deferred 4+/5 5/5  5/5  Shoulder external rotation Deferred 4/5 5/5 4+/5  (Blank rows = not tested) Strength testing was deferred today secondary to being 15 days status post right rotator cuff repair   BFR settings: Cuff size 2 Supine, sitting and standing LOP 150 mmHg (exercised at 75 mmHg)                   TODAY'S TREATMENT: 01/09/2023 TherEx UBE UE only Lvl 3.0 3 mins each way for mobility loosening.  Supine horizontal abduction bilateral blue band 2 x 15 Standing blue band Rt ER in neutral with arm at side x 15 Standing blue band Rt ER isometric walk outs 5 sec hold x 10 Standing blue band Rt ER c flexion punch x 10  Machine rows 35 lbs 2 x 15 Machine lat pull down 15 lbs 2 x 15 Machine chest press with SA press hold 2 seconds 15 lbs x 15  Review of HEP updates with focus on routine stretching with longer duration holds.   Manual Supine Lt shoulder g3 inferior jt mobs in flexion, scaption and abduction.  Passive ER c posterior jt glides in 60 deg abduction mobilization c movement.   12/30/22 TherEx UBE L5 x 6 min (3 min each direction) BATCA rows 25# 3x10 BATCA lat pull downs 15# 3x10 Bicep curls with overhead press 5# bil; 3x10 Standing shoulder flexion 5# bil; 3x10 Standing bil ER and scapular retraction with 5# bil; 3x10 High row L4 band 3x10 Floor to waist height box lift (18#; no weight added) 2x10 reps Standing AA extension with 2# bar 2x10 Standing AA IR with 2# bar 2x10  12/25/22 TherEx BATCA rows 25# 3x10  BATCA lat pull downs 15# 3x10 ROM measurements - see above for details Seated AA shoulder flexion 2x10 with 2# bar Standing AA abduction with 2# bar 2x10 Standing AA ER with 2# bar 2x10 Standing AA extension with 2# bar 2x10 Standing AA IR with 2# bar 2x10 Standing Rt shoulder flexion 2x10; 3#; hold at 90 deg Standing Rt shoulder scaption 2x10; 3# with hold at 90 deg Standing shoulder flexion wall slides x 10 reps; 10 sec hold Standing shoulder abduction wall slides x  10 reps; 10 sec hold Standing ER stretch 10 x 10 sec hold   12/22/22 TherEx UBE L5 x 6 min (3 min each direction) Doorway ER stretch 5x20 sec hold, Rt  With BFR (LOP 170 mmHg, exercises at 85 mmHg) Wall ladder shoulder flexion and scaption x 10 reps with 5 sec hold at end range Standing shoulder flexion to 90 deg (avoiding shrug) Rt 3 x 15; 2# Standing shoulder abduction to 90 deg (avoiding shrug) Rt 3x15; 2# ER L3 band on Rt; 3x15 IR L3 band on Rt; 3x15    PATIENT EDUCATION: 01/09/2023 Education details: HEP update:  Person educated: Patient Education method: Programmer, multimedia, Facilities manager, Verbal/visual  cues, and Handouts Education comprehension: verbalized understanding, returned demonstration, verbal cues required  HOME EXERCISE PROGRAM: Access Code: [email or web address] URL: [email or web address] Date: 01/09/2023 Prepared by: Chyrel Masson  Exercises - Single Arm Doorway Pec Stretch at 90 Degrees Abduction  - 2-3 x daily - 7 x weekly - 1 sets - 3-5 reps - 15-30 hold - Standing Shoulder Posterior Capsule Stretch (Mirrored)  - 2-3 x daily - 7 x weekly - 1 sets - 5 reps - 15-30 hold - Standing shoulder flexion wall slides  - 2-3 x daily - 7 x weekly - 1 sets - 10 reps - 5 hold - Standing Bilateral Low Shoulder Row with Anchored Resistance  - 1-2 x daily - 7 x weekly - 2-3 sets - 10-15 reps - Shoulder Extension with Resistance  - 1-2 x daily - 7 x weekly - 2-3 sets - 10-15 reps - Supine Shoulder Horizontal Abduction with Resistance  - 1-2 x daily - 7 x weekly - 2-3 sets - 10-15 reps - Shoulder External Rotation with Anchored Resistance (Mirrored)  - 2 x daily - 7 x weekly - 3 sets - 10 reps - Standing Shoulder Flexion to 90 Degrees with Dumbbells  - 1 x daily - 7 x weekly - 2-3 sets - 10-15 reps - Shoulder Abduction with Dumbbells - Thumbs Up  - 1 x daily - 7 x weekly - 2-3 sets - 10-15 reps - Shoulder Overhead Press in Flexion with Dumbbells  - 1 x daily - 7 x weekly - 2-3  sets - 10 reps  ASSESSMENT:  CLINICAL IMPRESSION: The patient has attended 14 visits over the course of treatment cycle.  Patient has reported overall improvement at 90%.  See objective data above for updated information regarding current presentation.  Gains have been noted but impairment still noted in end range Rt shoulder mobility and strength as documented.  Continued skilled PT services warranted to help progress to reach goals.    OBJECTIVE IMPAIRMENTS: decreased activity tolerance, decreased endurance, decreased knowledge of condition, decreased ROM, decreased strength, decreased safety awareness, increased edema, impaired perceived functional ability, increased muscle spasms, impaired UE functional use, postural dysfunction, and pain.   ACTIVITY LIMITATIONS: carrying, lifting, sleeping, toileting, dressing, reach over head, and hygiene/grooming  PARTICIPATION LIMITATIONS: meal prep, cleaning, laundry, driving, shopping, community activity, and  occupation  PERSONAL FACTORS: OA left 1st CMC joint, right foot surgery, significant kidney history are also affecting patient's functional outcome.   REHAB POTENTIAL: Good  CLINICAL DECISION MAKING: Stable/uncomplicated  EVALUATION COMPLEXITY: Low   GOALS: Goals reviewed with patient? Yes  SHORT TERM GOALS: Target date: 11/21/2022  Damen will be independent with his day 1 HEP Baseline: Started 10/24/2022 Goal status: MET 11/17/22  2.  Improve right shoulder passive range of motion for flexion to 150 degrees; internal rotation 50 degrees and external rotation to 70 degrees Goal status: partially met  11/19/22  3.  Beth will be able to sleep at least 4 hours uninterrupted Baseline: 1 to 2 hours Goal status: MET 11/17/22   LONG TERM GOALS: Target date: 02/20/2023  Improve FOTO to 68 in 18 visits  Goal status: Met 01/09/2023  2.  Improve right should pain to consistently 0-3/10 on the VAS.  Goal status revised  3.  Improve  right shoulder AROM for flexion to 160 degrees; ER to 75 degrees in 45 deg abduction ; IR to 80 degrees and horizontal adduction to 40 degrees  Goal status: revised  4.  Improve right shoulder strength 5/5 throughout for functional lifting in daily activity   Goal status:  revised  5.  Jasin will be independent with his long-term maintenance HEP at DC  Goal status:  revised   PLAN:  PT FREQUENCY:1x/week  PT DURATION: 6 weeks  PLANNED INTERVENTIONS: Therapeutic exercises, Therapeutic activity, Neuromuscular re-education, Patient/Family education, Self Care, Joint mobilization, Cryotherapy, Vasopneumatic device, and Manual therapy  PLAN FOR NEXT SESSION: Progressive strengthening/ end range gains.    Chyrel Masson, PT, DPT, OCS, ATC 01/09/23  9:15 AM

## 2023-01-12 ENCOUNTER — Ambulatory Visit (INDEPENDENT_AMBULATORY_CARE_PROVIDER_SITE_OTHER): Payer: BC Managed Care – PPO | Admitting: Physical Therapy

## 2023-01-12 ENCOUNTER — Encounter: Payer: Self-pay | Admitting: Physical Therapy

## 2023-01-12 DIAGNOSIS — R600 Localized edema: Secondary | ICD-10-CM

## 2023-01-12 DIAGNOSIS — M25511 Pain in right shoulder: Secondary | ICD-10-CM

## 2023-01-12 DIAGNOSIS — M6281 Muscle weakness (generalized): Secondary | ICD-10-CM

## 2023-01-12 DIAGNOSIS — M25611 Stiffness of right shoulder, not elsewhere classified: Secondary | ICD-10-CM | POA: Diagnosis not present

## 2023-01-12 NOTE — Therapy (Signed)
OUTPATIENT PHYSICAL THERAPY TREATMENT   Patient Name: Mitchell Hughes MRN: [id number] DOB:09-Nov-1965, 57 y.o., male Today's Date: 01/12/2023     END OF SESSION:  PT End of Session - 01/12/23 1303     Visit Number 15    Number of Visits 19    Date for PT Re-Evaluation 02/20/23    Authorization Type [employer] 20% coinsurance    Authorization - Number of Visits 60    Progress Note Due on Visit 19    PT Start Time 1300    PT Stop Time 1339    PT Time Calculation (min) 39 min    Activity Tolerance Patient tolerated treatment well    Behavior During Therapy [facility] for tasks assessed/performed                    Past Medical History:  Diagnosis Date   Arthritis    Erectile dysfunction    Family history of colon cancer    sister in 64s   Family history of heart disease 2011   baseline cardiac eval 2011 with Dr. Royann Shivers   H/O echocardiogram 2015   Dr. Donnie Aho   H/O echocardiogram 02/2019   History of exercise stress test    04/2014 Dr. Donnie Aho, prior 2011 normal Bruce treadmill stress test, Dr. Rennis Golden   History of kidney stones    LOW BACK PAIN, ACUTE 09/26/2009   OSA (obstructive sleep apnea) 01/2022   has cpap   TOBACCO USE, QUIT 09/26/2009   Wears glasses    Past Surgical History:  Procedure Laterality Date   COLONOSCOPY  08/2017   tubular adenoma, repeat 5 years; Dr. Ileene Patrick   FOOT SURGERY     bone spur, right; Dr. Rolland Bimler HERNIA REPAIR Bilateral 04/11/2022   Procedure: LAPAROSCOPIC BILATERAL INGUINAL HERNIA REPAIR with MESH;  Surgeon: Karie Soda, MD;  Location: [facility];  Service: General;  Laterality: Bilateral;  LOCAL   IR NEPHROSTOMY PLACEMENT RIGHT  04/14/2021   NEPHROLITHOTOMY Right 05/17/2021   Procedure: NEPHROLITHOTOMY PERCUTANEOUS insertion double j stent;  Surgeon: Sebastian Ache, MD;  Location: [facility];  Service: Urology;  Laterality: Right;   ROBOT ASSISTED LAPAROSCOPIC NEPHRECTOMY Left 07/26/2021   Procedure: XI ROBOTIC ASSISTED  LAPAROSCOPIC NEPHRECTOMY;  Surgeon: Sebastian Ache, MD;  Location: [facility];  Service: Urology;  Laterality: Left;  3 HRS   UMBILICAL HERNIA REPAIR N/A 04/11/2022   Procedure: HERNIA REPAIR UMBILICAL ADULT;  Surgeon: Karie Soda, MD;  Location: [facility];  Service: General;  Laterality: N/A;  PRIMARY UMBILICAL HERNIA REPAIR   WISDOM TOOTH EXTRACTION     WISDOM TOOTH EXTRACTION     Patient Active Problem List   Diagnosis Date Noted   Nontraumatic complete tear of right rotator cuff 07/22/2022   Impingement syndrome of right shoulder 07/22/2022   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   Benign prostatic hyperplasia with weak urinary stream 02/24/2022   Urinary hesitancy 02/24/2022   COVID-19 vaccine administered 02/24/2022   Screening for lipid disorders 02/24/2022   History of renal cell cancer 02/24/2022   History of renal stone 02/24/2022   Umbilical hernia without obstruction and without gangrene 02/24/2022   Tendinitis of left triceps 01/21/2022   Renal stone 05/17/2021   Sepsis with acute organ dysfunction (HCC) 04/19/2021   Kidney stone 04/19/2021   Renal mass 04/19/2021   Splenomegaly 04/19/2021   Sepsis (HCC) 04/14/2021   Acute lower UTI 04/14/2021   Bilateral hand numbness 02/14/2021   Localized swelling on right hand  02/14/2021   Sleep disturbance 02/14/2021   Snoring 02/14/2021   Erectile dysfunction 02/14/2021   Need for influenza vaccination 02/14/2021   Impingement syndrome of left shoulder 06/20/2020   Primary osteoarthritis of first carpometacarpal joint of left hand 06/20/2020   Encounter for health maintenance examination in adult 02/17/2020   Screening for prostate cancer 02/17/2020   History of COVID-19 02/17/2020   Toenail deformity 02/17/2020   Neuropathy of foot, right 02/17/2020   Chronic pain of both shoulders 04/22/2019   Decreased range of motion of shoulder 04/22/2019   Degenerative arthritis of thumb, left 04/22/2019   Right inguinal hernia  01/28/2019   Aortic valve calcification 01/28/2019   Vaccine counseling 06/22/2017   Family history of heart disease 12/21/2013   Family history of colon cancer 12/21/2013   Nephrolithiasis 09/28/2009    PCP: Jac Canavan, PA-C  REFERRING PROVIDER: Cristie Hem, PA-C  REFERRING DIAG:  Diagnosis  [phone] (ICD-10-CM) - S/P arthroscopy of right shoulder    THERAPY DIAG:  Acute pain of right shoulder  Stiffness of right shoulder, not elsewhere classified  Localized edema  Muscle weakness (generalized)  Rationale for Evaluation and Treatment: Rehabilitation  ONSET DATE: October 09, 2022 surgery  SUBJECTIVE:                                                                                                                                                                                      SUBJECTIVE STATEMENT: Shoulder is sore but otherwise doing well.    Hand dominance: Right  PERTINENT HISTORY: OA left 1st CMC joint, right foot surgery, significant kidney history  PAIN:  NPRS scale: pain at worst in last 2 weeks 6/10  Pain location: right shoulder Pain description: Achy with occasional sharp, stabbing Aggravating factors: Most right shoulder use Relieving factors: Oxycodone day and night  PRECAUTIONS: Other: Rotator cuff repair 10/09/2022  WEIGHT BEARING RESTRICTIONS: Yes None on right upper extremity  FALLS:  Has patient fallen in last 6 months? 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 Moderate  distal infraspinatus tendinosis with articular and bursal sided fraying. Mild distal subscapularis tendinosis. No significant muscle atrophy.   Mild intra-articular long head biceps tendinosis.   Mild glenohumeral and AC joint osteoarthritis.  PATIENT SURVEYS:  01/09/2023:  FOTO update:  72  12/10/2022:  FOTO update:  61  10/24/22: FOTO 32 (Goal 68 in 18 visits)  COGNITION: 10/24/2022 Overall cognitive status: Within functional limits for tasks assessed     SENSATION: 10/24/2022 Mitchell Hughes has no complaints of peripheral pain or paresthesias related to his right shoulder surgery  POSTURE: 10/24/2022 Mild forward head, internally rotated and protracted shoulders  UPPER EXTREMITY ROM:   Passive ROM Left/Right 10/24/2022 Right 10/30/22 Right 11/17/22 Right 11/24/22 Right 12/10/2022 Right 12/25/22 Right 01/09/2023 AROM in supine  Shoulder flexion 135/90 P: 100 P: 134 A: 106 deg, no pain, noted shrug AROM in supine 132 A: 113  (Sitting) 140  Shoulder extension         Shoulder abduction  P: 104 P: 120 A: 89 deg no pain, shrug and trunk lean noted  AROM in supine 108 A: 85  (Sitting) 100  Shoulder adduction         Shoulder internal rotation 70/35    60 AROM in supine abduction 45 deg A: 55  (Sitting) 65 AROM in supine abduction 45 deg  Shoulder external rotation 85/10 P: 10 P: 30  40 AROM in supine abduction 45 deg FIR:  Rt iliac crest 40 AROM in supine abduction 45 deg  (Blank rows = not tested)  UPPER EXTREMITY Strength:  MMT Left/Right 10/24/2022 Right 12/10/2022 Left 12/10/2022 Right 01/09/2023  Shoulder flexion  4/5 5/5 4+/5  Shoulder extension      Shoulder abduction  3+/5 5/5 4+/5  Shoulder adduction      Shoulder internal rotation Deferred 4+/5 5/5 5/5  Shoulder external rotation Deferred 4/5 5/5 4+/5  (Blank rows = not tested) Strength testing was deferred today secondary to being 15 days status post right rotator cuff repair  01/12/23: overhead press average 19.9# force   BFR  settings: Cuff size 2 Supine, sitting and standing LOP 150 mmHg (exercised at 75 mmHg)                   TODAY'S TREATMENT: 01/12/23 TherEx UBE L5 x 3 mins each way BATCA rows 35# 3x10 BATCA lat pull downs 20# 3x10 BATCA chest press with SA hold 20# 3x10 Reactive isometrics ER/IR L4 2x10 on Rt High rows 2x10 L4 band Bent over Rt row 8# 2x10  TherAct Work simulated tasks including lifting box 18-35# from floor to waist height x 15-20 reps Baseline overhead press with dynamometer - see above for details  01/09/2023 TherEx UBE UE only Lvl 3.0 3 mins each way for mobility loosening.  Supine horizontal abduction bilateral blue band 2 x 15 Standing blue band Rt ER in neutral with arm at side x 15 Standing blue band Rt ER isometric walk outs 5 sec hold x 10 Standing blue band Rt ER c flexion punch x 10  Machine rows 35 lbs 2 x 15 Machine lat pull down 15 lbs 2 x 15 Machine chest press with SA press hold 2 seconds 15 lbs x 15  Review of HEP updates with focus on routine stretching with longer duration holds.   Manual Supine Lt shoulder g3 inferior jt mobs in flexion, scaption and abduction.  Passive ER c posterior jt glides in 60 deg abduction mobilization c movement.   12/30/22 TherEx UBE L5 x 6 min (3 min each direction) BATCA rows 25# 3x10 BATCA lat pull downs 15# 3x10 Bicep curls with overhead press 5# bil; 3x10 Standing shoulder flexion 5# bil; 3x10 Standing bil ER and scapular retraction with 5# bil;  3x10 High row L4 band 3x10 Floor to waist height box lift (18#; no weight added) 2x10 reps Standing AA extension with 2# bar 2x10 Standing AA IR with 2# bar 2x10  12/25/22 TherEx BATCA rows 25# 3x10 BATCA lat pull downs 15# 3x10 ROM measurements - see above for details Seated AA shoulder flexion 2x10 with 2# bar Standing AA abduction with 2# bar 2x10 Standing AA ER with 2# bar 2x10 Standing AA extension with 2# bar 2x10 Standing AA IR with 2# bar 2x10 Standing Rt  shoulder flexion 2x10; 3#; hold at 90 deg Standing Rt shoulder scaption 2x10; 3# with hold at 90 deg Standing shoulder flexion wall slides x 10 reps; 10 sec hold Standing shoulder abduction wall slides x 10 reps; 10 sec hold Standing ER stretch 10 x 10 sec hold   12/22/22 TherEx UBE L5 x 6 min (3 min each direction) Doorway ER stretch 5x20 sec hold, Rt  With BFR (LOP 170 mmHg, exercises at 85 mmHg) Wall ladder shoulder flexion and scaption x 10 reps with 5 sec hold at end range Standing shoulder flexion to 90 deg (avoiding shrug) Rt 3 x 15; 2# Standing shoulder abduction to 90 deg (avoiding shrug) Rt 3x15; 2# ER L3 band on Rt; 3x15 IR L3 band on Rt; 3x15    PATIENT EDUCATION: 01/09/2023 Education details: HEP update:  Person educated: Patient Education method: Programmer, multimedia, Facilities manager, Verbal/visual  cues, and Handouts Education comprehension: verbalized understanding, returned demonstration, verbal cues required  HOME EXERCISE PROGRAM: Access Code: [email or web address] URL: [email or web address] Date: 01/09/2023 Prepared by: Chyrel Masson  Exercises - Single Arm Doorway Pec Stretch at 90 Degrees Abduction  - 2-3 x daily - 7 x weekly - 1 sets - 3-5 reps - 15-30 hold - Standing Shoulder Posterior Capsule Stretch (Mirrored)  - 2-3 x daily - 7 x weekly - 1 sets - 5 reps - 15-30 hold - Standing shoulder flexion wall slides  - 2-3 x daily - 7 x weekly - 1 sets - 10 reps - 5 hold - Standing Bilateral Low Shoulder Row with Anchored Resistance  - 1-2 x daily - 7 x weekly - 2-3 sets - 10-15 reps - Shoulder Extension with Resistance  - 1-2 x daily - 7 x weekly - 2-3 sets - 10-15 reps - Supine Shoulder Horizontal Abduction with Resistance  - 1-2 x daily - 7 x weekly - 2-3 sets - 10-15 reps - Shoulder External Rotation with Anchored Resistance (Mirrored)  - 2 x daily - 7 x weekly - 3 sets - 10 reps - Standing Shoulder Flexion to 90 Degrees with Dumbbells  - 1 x daily - 7 x weekly -  2-3 sets - 10-15 reps - Shoulder Abduction with Dumbbells - Thumbs Up  - 1 x daily - 7 x weekly - 2-3 sets - 10-15 reps - Shoulder Overhead Press in Flexion with Dumbbells  - 1 x daily - 7 x weekly - 2-3 sets - 10 reps  ASSESSMENT:  CLINICAL IMPRESSION: Pt demonstrated improved ability to lift from floor tolerating up to 35#.  Baseline overhead force measured today and just shy of 20#.  Will continue to benefit from PT to maximize function.  OBJECTIVE IMPAIRMENTS: decreased activity tolerance, decreased endurance, decreased knowledge of condition, decreased ROM, decreased strength, decreased safety awareness, increased edema, impaired perceived functional ability, increased muscle spasms, impaired UE functional use, postural dysfunction, and pain.   ACTIVITY LIMITATIONS: carrying, lifting, sleeping, toileting, dressing, reach over head, and hygiene/grooming  PARTICIPATION LIMITATIONS: meal  prep, cleaning, laundry, driving, shopping, community activity, and occupation  PERSONAL FACTORS: OA left 1st CMC joint, right foot surgery, significant kidney history are also affecting patient's functional outcome.   REHAB POTENTIAL: Good  CLINICAL DECISION MAKING: Stable/uncomplicated  EVALUATION COMPLEXITY: Low   GOALS: Goals reviewed with patient? Yes  SHORT TERM GOALS: Target date: 11/21/2022  Mitchell Hughes will be independent with his day 1 HEP Baseline: Started 10/24/2022 Goal status: MET 11/17/22  2.  Improve right shoulder passive range of motion for flexion to 150 degrees; internal rotation 50 degrees and external rotation to 70 degrees Goal status: partially met  11/19/22  3.  Mitchell Hughes will be able to sleep at least 4 hours uninterrupted Baseline: 1 to 2 hours Goal status: MET 11/17/22   LONG TERM GOALS: Target date: 02/20/2023  Improve FOTO to 68 in 18 visits  Goal status: Met 01/09/2023  2.  Improve right should pain to consistently 0-3/10 on the VAS.  Goal status revised  3.  Improve  right shoulder AROM for flexion to 160 degrees; ER to 75 degrees in 45 deg abduction ; IR to 80 degrees and horizontal adduction to 40 degrees  Goal status: revised  4.  Improve right shoulder strength 5/5 throughout for functional lifting in daily activity   Goal status:  revised  5.  Mitchell Hughes will be independent with his long-term maintenance HEP at DC  Goal status:  revised   PLAN:  PT FREQUENCY:1x/week  PT DURATION: 6 weeks  PLANNED INTERVENTIONS: Therapeutic exercises, Therapeutic activity, Neuromuscular re-education, Patient/Family education, Self Care, Joint mobilization, Cryotherapy, Vasopneumatic device, and Manual therapy  PLAN FOR NEXT SESSION: work simulated tasks,  Progressive strengthening/ end range gains.    NEXT MD VISIT: 02/19/23  Clarita Crane, PT, DPT 01/12/23 1:41 PM

## 2023-01-15 ENCOUNTER — Encounter: Payer: BC Managed Care – PPO | Admitting: Neurology

## 2023-01-15 ENCOUNTER — Encounter: Payer: BC Managed Care – PPO | Admitting: Rehabilitative and Restorative Service Providers"

## 2023-01-22 ENCOUNTER — Encounter: Payer: BC Managed Care – PPO | Admitting: Rehabilitative and Restorative Service Providers"

## 2023-01-28 ENCOUNTER — Ambulatory Visit (HOSPITAL_COMMUNITY)
Admission: RE | Admit: 2023-01-28 | Discharge: 2023-01-28 | Disposition: A | Discharge status: Home / Self Care | Payer: BC Managed Care – PPO | Source: Ambulatory Visit | Attending: Urology | Admitting: Urology

## 2023-01-28 ENCOUNTER — Other Ambulatory Visit (HOSPITAL_COMMUNITY): Payer: Self-pay | Admitting: Urology

## 2023-01-28 ENCOUNTER — Other Ambulatory Visit: Payer: Self-pay | Admitting: Medical

## 2023-01-28 DIAGNOSIS — I700 Atherosclerosis of aorta: Secondary | ICD-10-CM | POA: Diagnosis not present

## 2023-01-28 DIAGNOSIS — Z125 Encounter for screening for malignant neoplasm of prostate: Secondary | ICD-10-CM | POA: Diagnosis not present

## 2023-01-28 DIAGNOSIS — C642 Malignant neoplasm of left kidney, except renal pelvis: Secondary | ICD-10-CM | POA: Diagnosis not present

## 2023-01-28 DIAGNOSIS — N200 Calculus of kidney: Secondary | ICD-10-CM | POA: Diagnosis not present

## 2023-01-29 ENCOUNTER — Ambulatory Visit (INDEPENDENT_AMBULATORY_CARE_PROVIDER_SITE_OTHER): Payer: BC Managed Care – PPO | Admitting: Rehabilitative and Restorative Service Providers"

## 2023-01-29 ENCOUNTER — Encounter: Payer: Self-pay | Admitting: Rehabilitative and Restorative Service Providers"

## 2023-01-29 DIAGNOSIS — R600 Localized edema: Secondary | ICD-10-CM | POA: Diagnosis not present

## 2023-01-29 DIAGNOSIS — M25611 Stiffness of right shoulder, not elsewhere classified: Secondary | ICD-10-CM

## 2023-01-29 DIAGNOSIS — M6281 Muscle weakness (generalized): Secondary | ICD-10-CM

## 2023-01-29 DIAGNOSIS — M25511 Pain in right shoulder: Secondary | ICD-10-CM | POA: Diagnosis not present

## 2023-01-29 NOTE — Therapy (Addendum)
OUTPATIENT PHYSICAL THERAPY TREATMENT / DISCHARGE   Patient Name: Mitchell Hughes MRN: [id number] DOB:04-Jul-1965, 57 y.o., male Today's Date: 01/29/2023     END OF SESSION:  PT End of Session - 01/29/23 1002     Visit Number 16    Number of Visits 19    Date for PT Re-Evaluation 02/20/23    Authorization Type [employer] 20% coinsurance    Authorization - Number of Visits 60    Progress Note Due on Visit 19    PT Start Time 1004    PT Stop Time 1030    PT Time Calculation (min) 26 min    Activity Tolerance Patient tolerated treatment well    Behavior During Therapy WFL for tasks assessed/performed             Past Medical History:  Diagnosis Date   Arthritis    Erectile dysfunction    Family history of colon cancer    sister in 35s   Family history of heart disease 2011   baseline cardiac eval 2011 with Dr. Royann Shivers   H/O echocardiogram 2015   Dr. Donnie Aho   H/O echocardiogram 02/2019   History of exercise stress test    04/2014 Dr. Donnie Aho, prior 2011 normal Bruce treadmill stress test, Dr. Rennis Golden   History of kidney stones    LOW BACK PAIN, ACUTE 09/26/2009   OSA (obstructive sleep apnea) 01/2022   has cpap   TOBACCO USE, QUIT 09/26/2009   Wears glasses    Past Surgical History:  Procedure Laterality Date   COLONOSCOPY  08/2017   tubular adenoma, repeat 5 years; Dr. Ileene Patrick   FOOT SURGERY     bone spur, right; Dr. Rolland Bimler HERNIA REPAIR Bilateral 04/11/2022   Procedure: LAPAROSCOPIC BILATERAL INGUINAL HERNIA REPAIR with MESH;  Surgeon: Karie Soda, MD;  Location: [facility];  Service: General;  Laterality: Bilateral;  LOCAL   IR NEPHROSTOMY PLACEMENT RIGHT  04/14/2021   NEPHROLITHOTOMY Right 05/17/2021   Procedure: NEPHROLITHOTOMY PERCUTANEOUS insertion double j stent;  Surgeon: Sebastian Ache, MD;  Location: [facility];  Service: Urology;  Laterality: Right;   ROBOT ASSISTED LAPAROSCOPIC NEPHRECTOMY Left 07/26/2021   Procedure: XI ROBOTIC ASSISTED  LAPAROSCOPIC NEPHRECTOMY;  Surgeon: Sebastian Ache, MD;  Location: [facility];  Service: Urology;  Laterality: Left;  3 HRS   UMBILICAL HERNIA REPAIR N/A 04/11/2022   Procedure: HERNIA REPAIR UMBILICAL ADULT;  Surgeon: Karie Soda, MD;  Location: [facility];  Service: General;  Laterality: N/A;  PRIMARY UMBILICAL HERNIA REPAIR   WISDOM TOOTH EXTRACTION     WISDOM TOOTH EXTRACTION     Patient Active Problem List   Diagnosis Date Noted   Nontraumatic complete tear of right rotator cuff 07/22/2022   Impingement syndrome of right shoulder 07/22/2022   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   Benign prostatic hyperplasia with weak urinary stream 02/24/2022   Urinary hesitancy 02/24/2022   COVID-19 vaccine administered 02/24/2022   Screening for lipid disorders 02/24/2022   History of renal cell cancer 02/24/2022   History of renal stone 02/24/2022   Umbilical hernia without obstruction and without gangrene 02/24/2022   Tendinitis of left triceps 01/21/2022   Renal stone 05/17/2021   Sepsis with acute organ dysfunction (HCC) 04/19/2021   Kidney stone 04/19/2021   Renal mass 04/19/2021   Splenomegaly 04/19/2021   Sepsis (HCC) 04/14/2021   Acute lower UTI 04/14/2021   Bilateral hand numbness 02/14/2021   Localized swelling on right hand 02/14/2021   Sleep disturbance  02/14/2021   Snoring 02/14/2021   Erectile dysfunction 02/14/2021   Need for influenza vaccination 02/14/2021   Impingement syndrome of left shoulder 06/20/2020   Primary osteoarthritis of first carpometacarpal joint of left hand 06/20/2020   Encounter for health maintenance examination in adult 02/17/2020   Screening for prostate cancer 02/17/2020   History of COVID-19 02/17/2020   Toenail deformity 02/17/2020   Neuropathy of foot, right 02/17/2020   Chronic pain of both shoulders 04/22/2019   Decreased range of motion of shoulder 04/22/2019   Degenerative arthritis of thumb, left 04/22/2019   Right inguinal hernia  01/28/2019   Aortic valve calcification 01/28/2019   Vaccine counseling 06/22/2017   Family history of heart disease 12/21/2013   Family history of colon cancer 12/21/2013   Nephrolithiasis 09/28/2009    PCP: Jac Canavan, PA-C  REFERRING PROVIDER: Cristie Hem, PA-C  REFERRING DIAG:  Diagnosis  [phone] (ICD-10-CM) - S/P arthroscopy of right shoulder    THERAPY DIAG:  Acute pain of right shoulder  Stiffness of right shoulder, not elsewhere classified  Localized edema  Muscle weakness (generalized)  Rationale for Evaluation and Treatment: Rehabilitation  ONSET DATE: October 09, 2022 surgery  SUBJECTIVE:                                                                                                                                                                                      SUBJECTIVE STATEMENT: Pt indicated feeling better without pain complaints.  Stiffness at times in reaching overhead/behind back.   Pt indicated he felt ready for work after next MD visit.    Hand dominance: Right  PERTINENT HISTORY: OA left 1st CMC joint, right foot surgery, significant kidney history  PAIN:  NPRS scale: 0/10.  Pain location: right shoulder Pain description: Achy with occasional sharp, stabbing Aggravating factors: Most right shoulder use Relieving factors: Oxycodone day and night  PRECAUTIONS: Other: Rotator cuff repair 10/09/2022  WEIGHT BEARING RESTRICTIONS: Yes None on right upper extremity  FALLS:  Has patient fallen in last 6 months? 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 Moderate distal infraspinatus tendinosis with articular and bursal sided fraying. Mild distal subscapularis tendinosis. No significant muscle atrophy.   Mild intra-articular long head biceps tendinosis.   Mild glenohumeral  and AC joint osteoarthritis.  PATIENT SURVEYS:  01/09/2023: FOTO update:  72  12/10/2022:  FOTO update:  61  10/24/22: FOTO 32 (Goal 68 in 18 visits)  COGNITION: 10/24/2022 Overall cognitive status: Within functional limits for tasks assessed     SENSATION: 10/24/2022 Anshul has no complaints of peripheral pain or paresthesias related to his right shoulder surgery  POSTURE: 10/24/2022 Mild forward head, internally rotated and protracted shoulders  UPPER EXTREMITY ROM:   Passive ROM Left/Right 10/24/2022 Right 10/30/22 Right 11/17/22 Right 11/24/22 Right 12/10/2022 Right 12/25/22 Right 01/09/2023 AROM in supine  Shoulder flexion 135/90 P: 100 P: 134 A: 106 deg, no pain, noted shrug AROM in supine 132 A: 113  (Sitting) 140  Shoulder extension         Shoulder abduction  P: 104 P: 120 A: 89 deg no pain, shrug and trunk lean noted  AROM in supine 108 A: 85  (Sitting) 100  Shoulder adduction         Shoulder internal rotation 70/35    60 AROM in supine abduction 45 deg A: 55  (Sitting) 65 AROM in supine abduction 45 deg  Shoulder external rotation 85/10 P: 10 P: 30  40 AROM in supine abduction 45 deg FIR:  Rt iliac crest 40 AROM in supine abduction 45 deg  (Blank rows = not tested)  UPPER EXTREMITY Strength:  MMT Left/Right 10/24/2022 Right 12/10/2022 Left 12/10/2022 Right 01/09/2023 Right 01/29/2023  Shoulder flexion  4/5 5/5 4+/5 4+/5  Shoulder extension       Shoulder abduction  3+/5 5/5 4+/5 5/5  Shoulder adduction       Shoulder internal rotation Deferred 4+/5 5/5 5/5   Shoulder external rotation Deferred 4/5 5/5 4+/5 5/5  (Blank rows = not tested) Strength  testing was deferred today secondary to being 15 days status post right rotator cuff repair  01/12/23: overhead press average 19.9# force  BFR settings: Cuff size 2 Supine, sitting and standing LOP 150 mmHg (exercised at 75 mmHg)                   TODAY'S TREATMENT:                                                          DATE:  01/29/2023 TherEx Verbal review of existing HEP c cues for continued use and guidance on routine frequency.  UBE Lvl 3.5 4 mins fwd/back each way with :15 interval faster each minute.   BATCA rows 35# 2 x 15  BATCA lat pull downs 20# 2 x 15  BATCA chest press with SA hold 20# 3x10     TODAY'S TREATMENT:                                                          DATE: 01/12/23 TherEx UBE L5 x 3 mins each way BATCA rows 35# 3x10 BATCA lat pull downs 20# 3x10 BATCA chest press with SA hold 20# 3x10 Reactive isometrics ER/IR L4 2x10 on Rt High rows 2x10 L4 band Bent over Rt row 8# 2x10  TherAct Work simulated tasks including lifting box 18-35# from  floor to waist height x 15-20 reps Baseline overhead press with dynamometer - see above for details  TODAY'S TREATMENT:                                                          DATE9/10/2022 TherEx UBE UE only Lvl 3.0 3 mins each way for mobility loosening.  Supine horizontal abduction bilateral blue band 2 x 15 Standing blue band Rt ER in neutral with arm at side x 15 Standing blue band Rt ER isometric walk outs 5 sec hold x 10 Standing blue band Rt ER c flexion punch x 10  Machine rows 35 lbs 2 x 15 Machine lat pull down 15 lbs 2 x 15 Machine chest press with SA press hold 2 seconds 15 lbs x 15  Review of HEP updates with focus on routine stretching with longer duration holds.   Manual Supine Lt shoulder g3 inferior jt mobs in flexion, scaption and abduction.  Passive ER c posterior jt glides in 60 deg abduction mobilization c movement.   TODAY'S TREATMENT:                                                           DATE8/27/24 TherEx UBE L5 x 6 min (3 min each direction) BATCA rows 25# 3x10 BATCA lat pull downs 15# 3x10 Bicep curls with overhead press 5# bil; 3x10 Standing shoulder flexion 5# bil; 3x10 Standing bil ER and scapular retraction with 5# bil; 3x10 High row L4 band 3x10 Floor to waist height box lift (18#; no weight added) 2x10 reps Standing AA extension with 2# bar 2x10 Standing AA IR with 2# bar 2x10   PATIENT EDUCATION: 01/09/2023 Education details: HEP update:  Person educated: Patient Education method: Programmer, multimedia, Facilities manager, Verbal/visual  cues, and Handouts Education comprehension: verbalized understanding, returned demonstration, verbal cues required  HOME EXERCISE PROGRAM: Access Code: [email or web address] URL: [email or web address] Date: 01/09/2023 Prepared by: Chyrel Masson  Exercises - Single Arm Doorway Pec Stretch at 90 Degrees Abduction  - 2-3 x daily - 7 x weekly - 1 sets - 3-5 reps - 15-30 hold - Standing Shoulder Posterior Capsule Stretch (Mirrored)  - 2-3 x daily - 7 x weekly - 1 sets - 5 reps - 15-30 hold - Standing shoulder flexion wall slides  - 2-3 x daily - 7 x weekly - 1 sets - 10 reps - 5 hold - Standing Bilateral Low Shoulder Row with Anchored Resistance  - 1-2 x daily - 7 x weekly - 2-3 sets - 10-15 reps - Shoulder Extension with Resistance  - 1-2 x daily - 7 x weekly - 2-3 sets - 10-15 reps - Supine Shoulder Horizontal Abduction with Resistance  - 1-2 x daily - 7 x weekly - 2-3 sets - 10-15 reps - Shoulder External Rotation with Anchored Resistance (Mirrored)  - 2 x daily - 7 x weekly - 3 sets - 10 reps - Standing Shoulder Flexion to 90 Degrees with Dumbbells  - 1 x daily - 7 x weekly - 2-3 sets - 10-15 reps - Shoulder Abduction with Dumbbells - Thumbs Up  - 1 x daily -  7 x weekly - 2-3 sets - 10-15 reps - Shoulder Overhead Press in Flexion with Dumbbells  - 1 x daily - 7 x weekly - 2-3 sets - 10 reps  ASSESSMENT:  CLINICAL  IMPRESSION: Pt returned today with good confidence in gains with reduced symptoms noted.  Pt indicated he felt ready to trial HEP over next few weeks. Strength testing continued to show improvements.    OBJECTIVE IMPAIRMENTS: decreased activity tolerance, decreased endurance, decreased knowledge of condition, decreased ROM, decreased strength, decreased safety awareness, increased edema, impaired perceived functional ability, increased muscle spasms, impaired UE functional use, postural dysfunction, and pain.   ACTIVITY LIMITATIONS: carrying, lifting, sleeping, toileting, dressing, reach over head, and hygiene/grooming  PARTICIPATION LIMITATIONS: meal prep, cleaning, laundry, driving, shopping, community activity, and occupation  PERSONAL FACTORS: OA left 1st CMC joint, right foot surgery, significant kidney history are also affecting patient's functional outcome.   REHAB POTENTIAL: Good  CLINICAL DECISION MAKING: Stable/uncomplicated  EVALUATION COMPLEXITY: Low   GOALS: Goals reviewed with patient? Yes  SHORT TERM GOALS: Target date: 11/21/2022  Shaunak will be independent with his day 1 HEP Baseline: Started 10/24/2022 Goal status: MET 11/17/22  2.  Improve right shoulder passive range of motion for flexion to 150 degrees; internal rotation 50 degrees and external rotation to 70 degrees Goal status: partially met  11/19/22  3.  Edsell will be able to sleep at least 4 hours uninterrupted Baseline: 1 to 2 hours Goal status: MET 11/17/22   LONG TERM GOALS: Target date: 02/20/2023  Improve FOTO to 68 in 18 visits  Goal status: Met 01/09/2023  2.  Improve right should pain to consistently 0-3/10 on the VAS.  Goal status revised  3.  Improve right shoulder AROM for flexion to 160 degrees; ER to 75 degrees in 45 deg abduction ; IR to 80 degrees and horizontal adduction to 40 degrees  Goal status: revised  4.  Improve right shoulder strength 5/5 throughout for functional lifting in  daily activity   Goal status:  revised  5.  Hobie will be independent with his long-term maintenance HEP at DC  Goal status:  revised   PLAN:  PT FREQUENCY:1x/week  PT DURATION: 6 weeks  PLANNED INTERVENTIONS: Therapeutic exercises, Therapeutic activity, Neuromuscular re-education, Patient/Family education, Self Care, Joint mobilization, Cryotherapy, Vasopneumatic device, and Manual therapy  PLAN FOR NEXT SESSION: Trial HEP period.  Discharge after 30 days inactivity.    NEXT MD VISIT: 02/19/23  Chyrel Masson, PT, DPT, OCS, ATC 01/29/23  10:31 AM   PHYSICAL THERAPY DISCHARGE SUMMARY  Visits from Start of Care: 16  Current functional level related to goals / functional outcomes: See note   Remaining deficits: See note   Education / Equipment: HEP  Patient goals were  partially  met . Patient is being discharged due to not returning since the last visit.  Chyrel Masson, PT, DPT, OCS, ATC 03/03/23  9:34 AM

## 2023-01-30 DIAGNOSIS — C642 Malignant neoplasm of left kidney, except renal pelvis: Secondary | ICD-10-CM | POA: Diagnosis not present

## 2023-02-19 ENCOUNTER — Ambulatory Visit: Payer: BC Managed Care – PPO | Admitting: Orthopaedic Surgery

## 2023-02-24 ENCOUNTER — Encounter: Payer: Self-pay | Admitting: Orthopaedic Surgery

## 2023-02-24 ENCOUNTER — Ambulatory Visit (INDEPENDENT_AMBULATORY_CARE_PROVIDER_SITE_OTHER): Payer: BC Managed Care – PPO | Admitting: Orthopaedic Surgery

## 2023-02-24 DIAGNOSIS — M7541 Impingement syndrome of right shoulder: Secondary | ICD-10-CM | POA: Diagnosis not present

## 2023-02-24 DIAGNOSIS — Z9889 Other specified postprocedural states: Secondary | ICD-10-CM | POA: Diagnosis not present

## 2023-02-24 DIAGNOSIS — M75121 Complete rotator cuff tear or rupture of right shoulder, not specified as traumatic: Secondary | ICD-10-CM | POA: Diagnosis not present

## 2023-02-24 DIAGNOSIS — R34 Anuria and oliguria: Secondary | ICD-10-CM | POA: Diagnosis not present

## 2023-02-24 DIAGNOSIS — N200 Calculus of kidney: Secondary | ICD-10-CM | POA: Diagnosis not present

## 2023-02-24 DIAGNOSIS — Z905 Acquired absence of kidney: Secondary | ICD-10-CM | POA: Diagnosis not present

## 2023-02-24 DIAGNOSIS — C642 Malignant neoplasm of left kidney, except renal pelvis: Secondary | ICD-10-CM | POA: Diagnosis not present

## 2023-02-24 NOTE — Progress Notes (Signed)
Office Visit Note   Patient: Mitchell Hughes           Date of Birth: 1966/02/03           MRN: [id number] Visit Date: 02/24/2023              Requested by: Jac Canavan, PA-C [address] [place],  [place] [address] PCP: Jac Canavan, PA-C   Assessment & Plan: Visit Diagnoses:  1. S/P arthroscopy of right shoulder   2. Nontraumatic complete tear of right rotator cuff   3. Impingement syndrome of right shoulder     Plan: Kayse is now 4 months status post right rotator cuff repair.  He has completed physical therapy.  He will do his home exercises for more strengthening.  Overall I am happy with the function of the shoulder.  He reports no pain.  Would like to recheck him in 2 months.  He would like to postpone left shoulder surgery until 2025.  He will call Debbie to cancel the surgery.  Follow-Up Instructions: Return in about 2 months (around 04/26/2023).   Orders:  No orders of the defined types were placed in this encounter.  No orders of the defined types were placed in this encounter.     Procedures: No procedures performed   Clinical Data: No additional findings.   Subjective: Chief Complaint  Patient presents with   Right Shoulder - Follow-up    Right shoulder scope 10/09/2022    HPI Patrik comes in today for his 14-month postop visit status post right rotator cuff repair.  He has completed physical therapy.  Overall he is doing well.  Review of Systems   Objective: Vital Signs: There were no vitals taken for this visit.  Physical Exam  Ortho Exam Exam of the right shoulder shows abduction to 100 degrees and flexion to greater than 140 degrees.  Has a little bit of weakness past that.  Overall very happy with the progress. Specialty Comments:  No specialty comments available.  Imaging: No results found.   PMFS History: Patient Active Problem List   Diagnosis Date Noted   S/P arthroscopy of right shoulder 02/24/2023   Nontraumatic  complete tear of right rotator cuff 07/22/2022   Impingement syndrome of right shoulder 07/22/2022   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   Benign prostatic hyperplasia with weak urinary stream 02/24/2022   Urinary hesitancy 02/24/2022   COVID-19 vaccine administered 02/24/2022   Screening for lipid disorders 02/24/2022   History of renal cell cancer 02/24/2022   History of renal stone 02/24/2022   Umbilical hernia without obstruction and without gangrene 02/24/2022   Tendinitis of left triceps 01/21/2022   Renal stone 05/17/2021   Sepsis with acute organ dysfunction (HCC) 04/19/2021   Kidney stone 04/19/2021   Renal mass 04/19/2021   Splenomegaly 04/19/2021   Sepsis (HCC) 04/14/2021   Acute lower UTI 04/14/2021   Bilateral hand numbness 02/14/2021   Localized swelling on right hand 02/14/2021   Sleep disturbance 02/14/2021   Snoring 02/14/2021   Erectile dysfunction 02/14/2021   Need for influenza vaccination 02/14/2021   Impingement syndrome of left shoulder 06/20/2020   Primary osteoarthritis of first carpometacarpal joint of left hand 06/20/2020   Encounter for health maintenance examination in adult 02/17/2020   Screening for prostate cancer 02/17/2020   History of COVID-19 02/17/2020   Toenail deformity 02/17/2020   Neuropathy of foot, right 02/17/2020   Chronic pain of both shoulders 04/22/2019   Decreased range  of motion of shoulder 04/22/2019   Degenerative arthritis of thumb, left 04/22/2019   Right inguinal hernia 01/28/2019   Aortic valve calcification 01/28/2019   Vaccine counseling 06/22/2017   Family history of heart disease 12/21/2013   Family history of colon cancer 12/21/2013   Nephrolithiasis 09/28/2009   Past Medical History:  Diagnosis Date   Arthritis    Erectile dysfunction    Family history of colon cancer    sister in 4s   Family history of heart disease 2011   baseline cardiac eval 2011 with Dr. Royann Shivers   H/O echocardiogram 2015    Dr. Donnie Aho   H/O echocardiogram 02/2019   History of exercise stress test    04/2014 Dr. Donnie Aho, prior 2011 normal Bruce treadmill stress test, Dr. Rennis Golden   History of kidney stones    LOW BACK PAIN, ACUTE 09/26/2009   OSA (obstructive sleep apnea) 01/2022   has cpap   TOBACCO USE, QUIT 09/26/2009   Wears glasses     Family History  Problem Relation Age of Onset   Thyroid disease Mother    Other Mother        brain disease, possibly dementia?   Gallstones Mother    Other Father        spinal cord injury/accident   Heart disease Father 53   Heart disease Sister 23       artificial valve   Colon cancer Sister    Sleep apnea Sister    Cancer Sister 63       colon   Sleep apnea Sister    Cancer Sister        pancreas   Pancreatic cancer Sister    Heart disease Sister    Heart disease Brother 88       MI   Diabetes Brother    Pancreatic cancer Brother    Heart disease Brother 38       pacemaker   Cancer Brother        liver, formerly pancreas   Heart disease Other        parent, other relative   Colon cancer Other    Stroke Neg Hx    Hypertension Neg Hx    Rectal cancer Neg Hx    Stomach cancer Neg Hx     Past Surgical History:  Procedure Laterality Date   COLONOSCOPY  08/2017   tubular adenoma, repeat 5 years; Dr. Ileene Patrick   FOOT SURGERY     bone spur, right; Dr. Rolland Bimler HERNIA REPAIR Bilateral 04/11/2022   Procedure: LAPAROSCOPIC BILATERAL INGUINAL HERNIA REPAIR with MESH;  Surgeon: Karie Soda, MD;  Location: [facility];  Service: General;  Laterality: Bilateral;  LOCAL   IR NEPHROSTOMY PLACEMENT RIGHT  04/14/2021   NEPHROLITHOTOMY Right 05/17/2021   Procedure: NEPHROLITHOTOMY PERCUTANEOUS insertion double j stent;  Surgeon: Sebastian Ache, MD;  Location: [facility];  Service: Urology;  Laterality: Right;   ROBOT ASSISTED LAPAROSCOPIC NEPHRECTOMY Left 07/26/2021   Procedure: XI ROBOTIC ASSISTED LAPAROSCOPIC NEPHRECTOMY;  Surgeon: Sebastian Ache, MD;   Location: [facility];  Service: Urology;  Laterality: Left;  3 HRS   UMBILICAL HERNIA REPAIR N/A 04/11/2022   Procedure: HERNIA REPAIR UMBILICAL ADULT;  Surgeon: Karie Soda, MD;  Location: [facility];  Service: General;  Laterality: N/A;  PRIMARY UMBILICAL HERNIA REPAIR   WISDOM TOOTH EXTRACTION     WISDOM TOOTH EXTRACTION     Social History   Occupational History   Not on file  Tobacco  Use   Smoking status: Former    Current packs/day: 0.00    Average packs/day: 0.3 packs/day for 30.0 years (7.5 ttl pk-yrs)    Types: Cigarettes    Start date: 03/06/1979    Quit date: 03/05/2009    Years since quitting: 13.9   Smokeless tobacco: Never   Tobacco comments:    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

## 2023-02-25 ENCOUNTER — Telehealth: Payer: Self-pay | Admitting: Orthopaedic Surgery

## 2023-02-25 NOTE — Telephone Encounter (Signed)
[employer] forms received. To [employer]. 

## 2023-02-26 ENCOUNTER — Encounter: Payer: BC Managed Care – PPO | Admitting: Medical

## 2023-03-02 ENCOUNTER — Other Ambulatory Visit: Payer: BC Managed Care – PPO

## 2023-03-03 NOTE — Progress Notes (Deleted)
Patient: Mitchell Hughes Date of Birth: 02-02-1966  Reason for Visit: Follow up History from: Patient Primary Neurologist: Frances Furbish   ASSESSMENT AND PLAN 57 y.o. year old male    HISTORY OF PRESENT ILLNESS: Today 03/03/23 Last saw Dr. Frances Furbish January 2024, on AutoPap since October 2023.  Was struggling with compliance.  AHI was elevated with central events.  Had had several surgeries.  Suggested change from 10 AutoSet to set pressure 9 cm.  Ordered proper titration study.  Had titration study 10/14/2022, change CPAP to 8 cm EPR 2.  HISTORY  Mitchell Hughes is a 57 year old right-handed gentleman with an underlying medical history of low back pain, kidney stones, olecranon bursitis, arthritis, and status post bilateral hernia repairs, as well as mildly overweight state, who presents for follow-up consultation of his obstructive sleep apnea after interim testing and starting AutoPap therapy.  The patient is unaccompanied today.  I first met him at the request of his primary care provider on 11/21/2021, at which time he reported difficulty maintaining sleep and snoring.  He was advised to proceed with a sleep study.  He had a home sleep test on 01/15/2022 which showed severe obstructive sleep apnea with a total AHI of 47.3/hour and O2 nadir of 78%.  Intermittent mild to moderate snoring was detected.  The study was conducted during the day.  The patient is a third shift worker, total sleep time was slightly less than 4-1/2 hours.  He was advised to proceed with home AutoPap therapy.  His set up date was 03/03/2022.  He has a ResMed air sense 10 AutoSet machine.  His DME company is [employer].    Today, 05/29/2022: I reviewed his AutoPap compliance data from 03/30/2022 through 04/28/2022, which is a total of 30 days, during which time he used his machine 25 days with percent use days greater than 4 hours at 63%, indicating suboptimal compliance with an average usage of 5 hours and 18 minutes for days on treatment,  residual AHI elevated at 19.2/h, secondary to primarily central events, 95th percentile of pressure at 9.3 cm with a range of 7 to 14 cm with EPR of 3.  Review of his compliance data since set up date shows similar findings with elevated residual AHI, primarily secondary to central events.  Leak has been on the higher side with the 95th percentile around 22 L/min.  He reports still adjusting to his AutoPap therapy.  He is motivated to continue with treatment but does not sleep very well.  He is currently not working so he has been sleeping at night but still has trouble sleeping.  He had recent surgeries including bilateral hernia repairs in December 2023.  His primary care prescribed trazodone at 50 mg strength.  He has been taking it nightly for the past 2 months or so.  It helps a little bit but does not keep him asleep.  Sometimes he is awake for several hours.  He goes to bed around 11 currently.  He will be restarting work in mid February and will go back to the night shift.  He also had left elbow surgery not too long ago.  He does have a prescription for hydrocodone but does not take it regularly, only as needed.  He has started off with the small nasal pillows and has been using these but was also given the medium recently and would like to switch to the medium nasal pillows.  He would not be opposed to coming in for  a sleep study for proper titration.  We talked about his sleep test results and also current download with residual AHI elevated and central apneas noted.  His Epworth sleepiness score is 4 out of 24.    REVIEW OF SYSTEMS: Out of a complete 14 system review of symptoms, the patient complains only of the following symptoms, and all other reviewed systems are negative.  See HPI  ALLERGIES: No Known Allergies  HOME MEDICATIONS: Outpatient Medications Prior to Visit  Medication Sig Dispense Refill   desonide (DESOWEN) 0.05 % cream Apply topically.     gabapentin (NEURONTIN) 300 MG  capsule TAKE 1 CAPSULE(300 MG) BY MOUTH AT BEDTIME 90 capsule 3   HYDROcodone-acetaminophen (NORCO) 5-325 MG tablet Take 1 tablet by mouth every 6 (six) hours as needed. (Patient taking differently: Take 1 tablet by mouth as needed.) 20 tablet 0   methocarbamol (ROBAXIN-750) 750 MG tablet Take 1 tablet (750 mg total) by mouth 2 (two) times daily as needed for muscle spasms. 20 tablet 0   mupirocin ointment (BACTROBAN) 2 % Apply 1 Application topically 2 (two) times daily. Apply twice daily until healed 22 g 0   ondansetron (ZOFRAN) 4 MG tablet Take 1 tablet (4 mg total) by mouth every 8 (eight) hours as needed for nausea or vomiting. 40 tablet 0   oxyCODONE-acetaminophen (PERCOCET) 5-325 MG tablet Take 1 tablet by mouth every 6 (six) hours as needed. To be taken after surgery 30 tablet 0   predniSONE (DELTASONE) 10 MG tablet 6 tablets all together day 1, 5 tablets day 2, 4 tablets day 3, 3 tablets day 4, 2 tablets day 5, 1 tablet day 6. 21 tablet 0   tadalafil (CIALIS) 5 MG tablet TAKE 1 TABLET(5 MG) BY MOUTH DAILY 90 tablet 0   traMADol (ULTRAM) 50 MG tablet Take 1 tablet (50 mg total) by mouth 3 (three) times daily as needed. 30 tablet 2   traZODone (DESYREL) 50 MG tablet TAKE 1 TABLET(50 MG) BY MOUTH AT BEDTIME 90 tablet 0   Facility-Administered Medications Prior to Visit  Medication Dose Route Frequency Provider Last Rate Last Admin   polyethylene glycol powder (GLYCOLAX/MIRALAX) container 255 g  1 Container Oral Once Loletta Parish., MD        PAST MEDICAL HISTORY: Past Medical History:  Diagnosis Date   Arthritis    Erectile dysfunction    Family history of colon cancer    sister in 74s   Family history of heart disease 2011   baseline cardiac eval 2011 with Dr. Royann Shivers   H/O echocardiogram 2015   Dr. Donnie Aho   H/O echocardiogram 02/2019   History of exercise stress test    04/2014 Dr. Donnie Aho, prior 2011 normal Bruce treadmill stress test, Dr. Rennis Golden   History of kidney  stones    LOW BACK PAIN, ACUTE 09/26/2009   OSA (obstructive sleep apnea) 01/2022   has cpap   TOBACCO USE, QUIT 09/26/2009   Wears glasses     PAST SURGICAL HISTORY: Past Surgical History:  Procedure Laterality Date   COLONOSCOPY  08/2017   tubular adenoma, repeat 5 years; Dr. Ileene Patrick   FOOT SURGERY     bone spur, right; Dr. Rolland Bimler HERNIA REPAIR Bilateral 04/11/2022   Procedure: LAPAROSCOPIC BILATERAL INGUINAL HERNIA REPAIR with MESH;  Surgeon: Karie Soda, MD;  Location: [facility];  Service: General;  Laterality: Bilateral;  LOCAL   IR NEPHROSTOMY PLACEMENT RIGHT  04/14/2021   NEPHROLITHOTOMY Right 05/17/2021  Procedure: NEPHROLITHOTOMY PERCUTANEOUS insertion double j stent;  Surgeon: Sebastian Ache, MD;  Location: [facility];  Service: Urology;  Laterality: Right;   ROBOT ASSISTED LAPAROSCOPIC NEPHRECTOMY Left 07/26/2021   Procedure: XI ROBOTIC ASSISTED LAPAROSCOPIC NEPHRECTOMY;  Surgeon: Sebastian Ache, MD;  Location: [facility];  Service: Urology;  Laterality: Left;  3 HRS   UMBILICAL HERNIA REPAIR N/A 04/11/2022   Procedure: HERNIA REPAIR UMBILICAL ADULT;  Surgeon: Karie Soda, MD;  Location: [facility];  Service: General;  Laterality: N/A;  PRIMARY UMBILICAL HERNIA REPAIR   WISDOM TOOTH EXTRACTION     WISDOM TOOTH EXTRACTION      FAMILY HISTORY: Family History  Problem Relation Age of Onset   Thyroid disease Mother    Other Mother        brain disease, possibly dementia?   Gallstones Mother    Other Father        spinal cord injury/accident   Heart disease Father 56   Heart disease Sister 63       artificial valve   Colon cancer Sister    Sleep apnea Sister    Cancer Sister 65       colon   Sleep apnea Sister    Cancer Sister        pancreas   Pancreatic cancer Sister    Heart disease Sister    Heart disease Brother 28       MI   Diabetes Brother    Pancreatic cancer Brother    Heart disease Brother 33       pacemaker   Cancer Brother         liver, formerly pancreas   Heart disease Other        parent, other relative   Colon cancer Other    Stroke Neg Hx    Hypertension Neg Hx    Rectal cancer Neg Hx    Stomach cancer Neg Hx     SOCIAL HISTORY: Social History   Socioeconomic History   Marital status: Single    Spouse name: Not on file   Number of children: Not on file   Years of education: Not on file   Highest education level: Not on file  Occupational History   Not on file  Tobacco Use   Smoking status: Former    Current packs/day: 0.00    Average packs/day: 0.3 packs/day for 30.0 years (7.5 ttl pk-yrs)    Types: Cigarettes    Start date: 03/06/1979    Quit date: 03/05/2009    Years since quitting: 14.0   Smokeless tobacco: Never   Tobacco comments:    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  01/2019   Social Determinants of Health   Financial Resource Strain: Not on file  Food Insecurity: Not on file  Transportation Needs: Not on file  Physical Activity: Not on file  Stress: Not on file  Social Connections: Unknown (09/16/2021)  Received from [facility], [facility]   Social Network    Social Network: Not on file  Intimate Partner Violence: Unknown (08/08/2021)   Received from [facility], [facility]   HITS    Physically Hurt: Not on file    Insult or Talk Down To: Not on file    Threaten Physical Harm: Not on file    Scream or Curse: Not on file    PHYSICAL EXAM  There were no vitals filed for  this visit. There is no height or weight on file to calculate BMI.  Generalized: Well developed, in no acute distress  Neurological examination  Mentation: Alert oriented to time, place, history taking. Follows all commands speech and language fluent Cranial nerve II-XII: Pupils were equal round reactive to light. Extraocular movements were full, visual field were full on confrontational test. Facial sensation and strength were normal. Uvula tongue midline. Head turning and shoulder shrug  were normal and symmetric. Motor: The motor testing reveals 5 over 5 strength of all 4 extremities. Good symmetric motor tone is noted throughout.  Sensory: Sensory testing is intact to soft touch on all 4 extremities. No evidence of extinction is noted.  Coordination: Cerebellar testing reveals good finger-nose-finger and heel-to-shin bilaterally.  Gait and station: Gait is normal. Tandem gait is normal. Romberg is negative. No drift is seen.  Reflexes: Deep tendon reflexes are symmetric and normal bilaterally.   DIAGNOSTIC DATA (LABS, IMAGING, TESTING) - I reviewed patient records, labs, notes, testing and imaging myself where available.  Lab Results  Component Value Date   WBC 9.2 03/26/2022   HGB 15.6 03/26/2022   HCT 45.6 03/26/2022   MCV 89.1 03/26/2022   PLT 268 03/26/2022      Component Value Date/Time   NA 138 02/24/2022 1005   K 4.5 02/24/2022 1005   CL 102 02/24/2022 1005   CO2 20 02/24/2022 1005   GLUCOSE 105 (H) 02/24/2022 1005   GLUCOSE 140 (H) 07/27/2021 0344   BUN 18 02/24/2022 1005   CREATININE 1.73 (H) 02/24/2022 1005   CREATININE 0.92 06/25/2015 0001   CALCIUM 10.0 02/24/2022 1005   PROT 7.3 02/24/2022 1005   ALBUMIN 4.8 02/24/2022 1005   AST 20 02/24/2022 1005   ALT 20 02/24/2022 1005   ALKPHOS 63 02/24/2022 1005   BILITOT 0.7 02/24/2022 1005   GFRNONAA 47 (L) 07/27/2021 0344   GFRAA 80 02/17/2020 0953   Lab Results  Component Value Date   CHOL 187 02/24/2022    HDL 42 02/24/2022   LDLCALC 116 (H) 02/24/2022   TRIG 161 (H) 02/24/2022   CHOLHDL 4.5 02/24/2022   Lab Results  Component Value Date   HGBA1C 5.5 02/21/2021   Lab Results  Component Value Date   VITAMINB12 277 02/21/2021   Lab Results  Component Value Date   TSH 1.320 06/22/2017    Margie Ege, AGNP-C, DNP 03/03/2023, 12:38 PM [facility] [address], [address] [place], [place] [address] [phone]

## 2023-03-04 ENCOUNTER — Encounter: Payer: BC Managed Care – PPO | Admitting: Neurology

## 2023-03-04 ENCOUNTER — Encounter: Payer: Self-pay | Admitting: Neurology

## 2023-04-16 ENCOUNTER — Encounter: Payer: BC Managed Care – PPO | Admitting: Orthopaedic Surgery

## 2023-04-27 ENCOUNTER — Encounter: Payer: BC Managed Care – PPO | Admitting: Medical

## 2023-07-22 IMAGING — CT CT ABD-PELV W/ CM
2 of 5 series · 15 of 46 positions shown, 17 images · IV contrast (Omnipaque or Isovue)
Comparison: CT abdomen and pelvis 09/28/2009

CLINICAL DATA: Right lower quadrant pain

EXAM:
CT ABDOMEN AND PELVIS WITH CONTRAST
TECHNIQUE: Multidetector CT imaging of the abdomen and pelvis was performed
using the standard protocol following bolus administration of
intravenous contrast.
CONTRAST:  100mL OMNIPAQUE IOHEXOL 300 MG/ML  SOLN

[Series 2: axial st · axial · 0.98mm/px · z∈[-236,+269]mm · 12 of 115 slices shown, 14 images]
[im 7/115  soft-tissue]
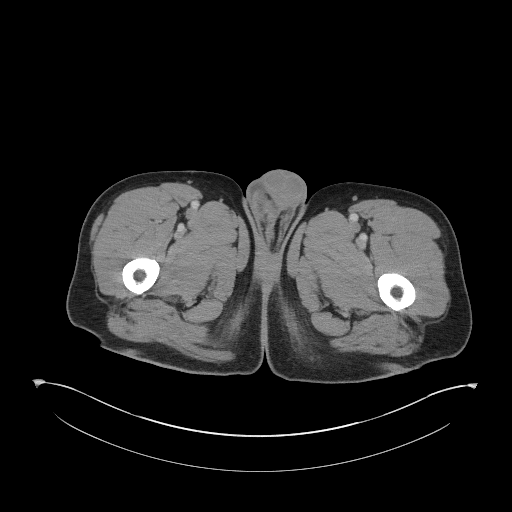
[im 7/115  bone]
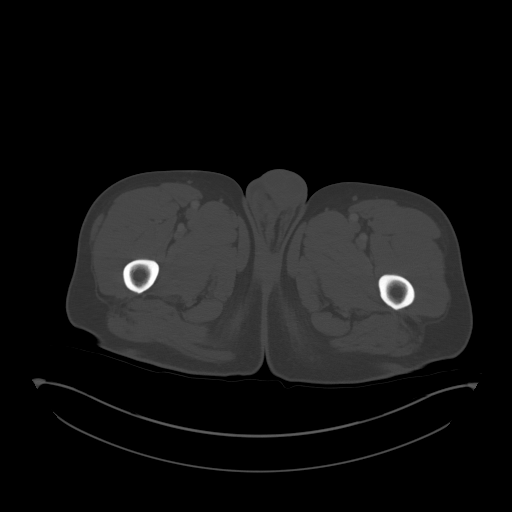
[im 20/115  soft-tissue]
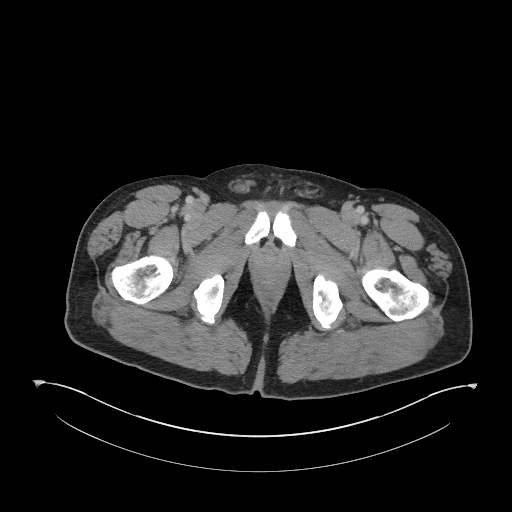
[im 26/115  soft-tissue]
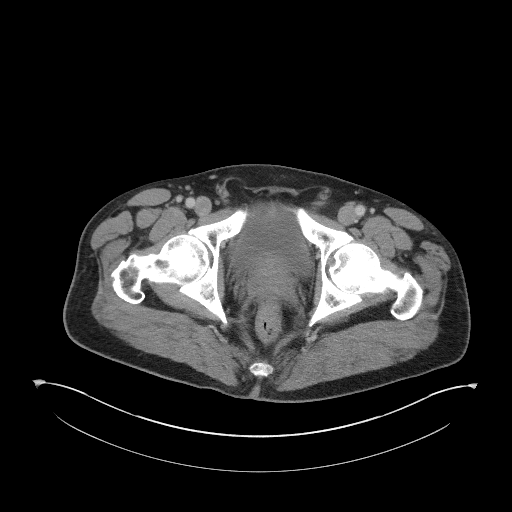
[im 32/115  soft-tissue]
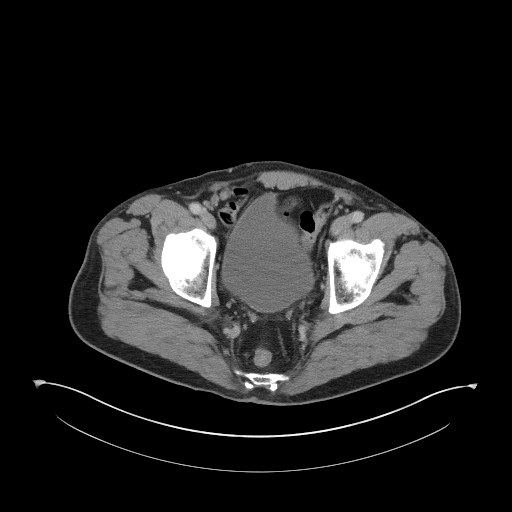
[im 45/115  soft-tissue]
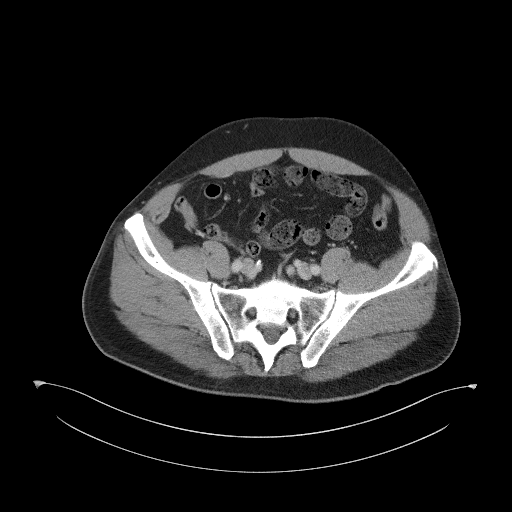
[im 51/115  soft-tissue]
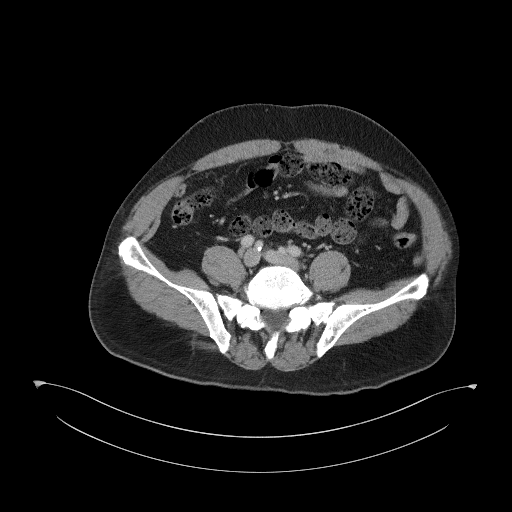
[im 64/115  soft-tissue]
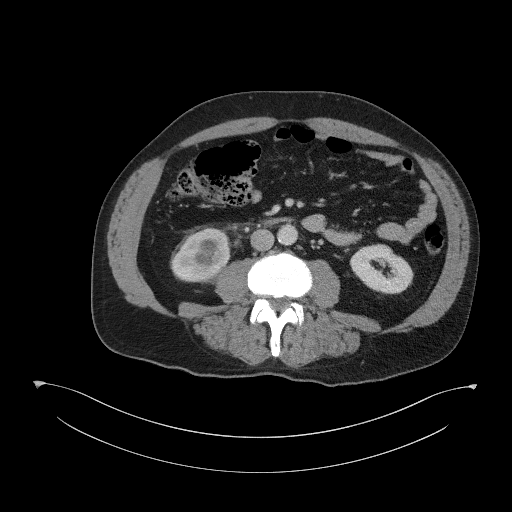
[im 70/115  soft-tissue]
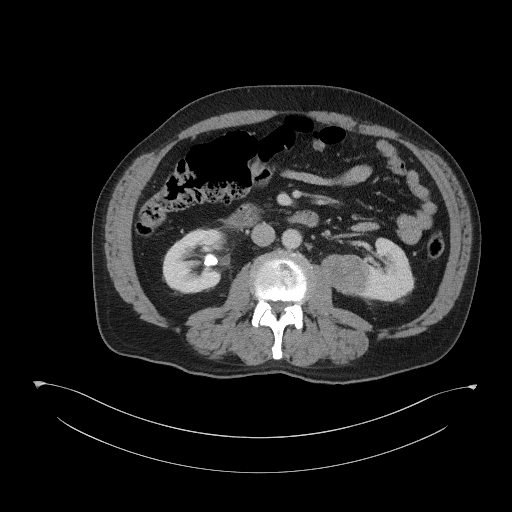
[im 83/115  soft-tissue]
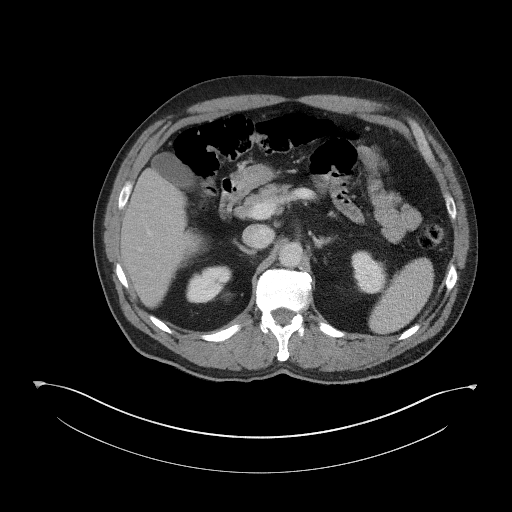
[im 83/115  bone]
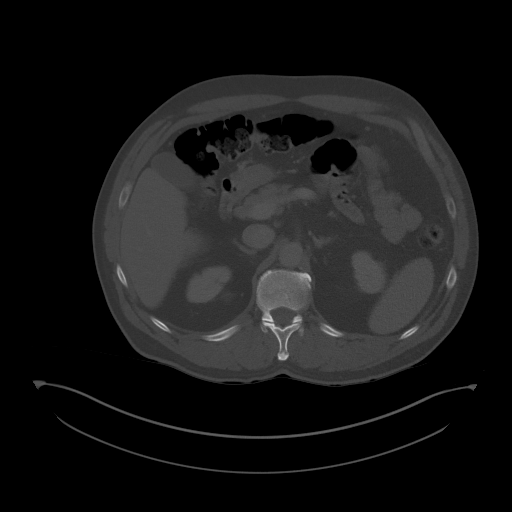
[im 89/115  soft-tissue]
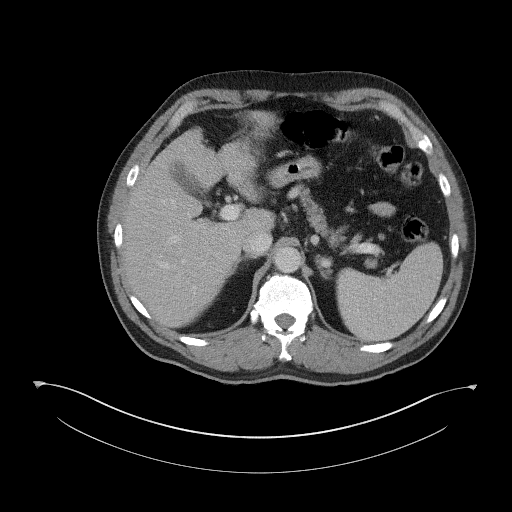
[im 96/115  soft-tissue]
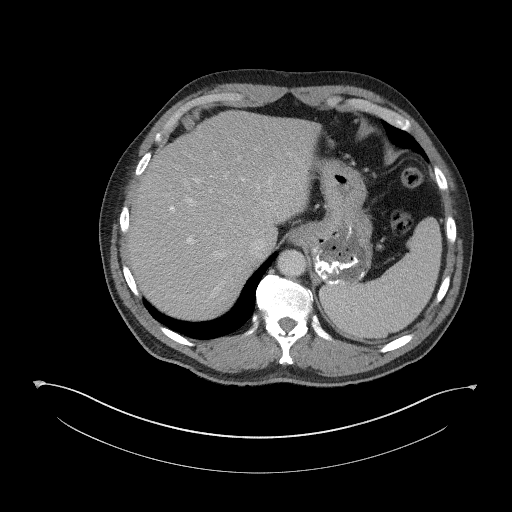
[im 108/115  soft-tissue]
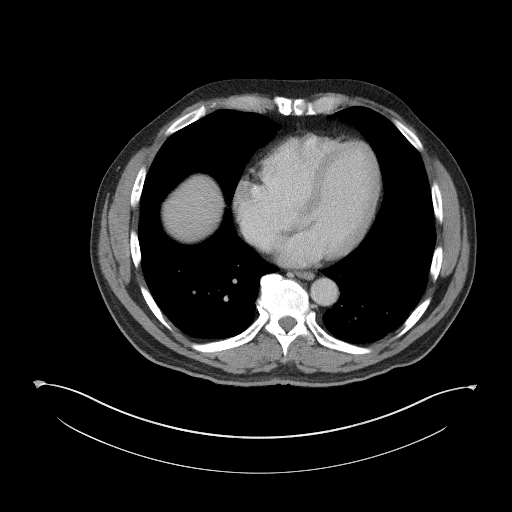

[Series 5: coronal st · coronal · 0.92mm/px · 3 of 128 slices shown]
[im 43/128  soft-tissue]
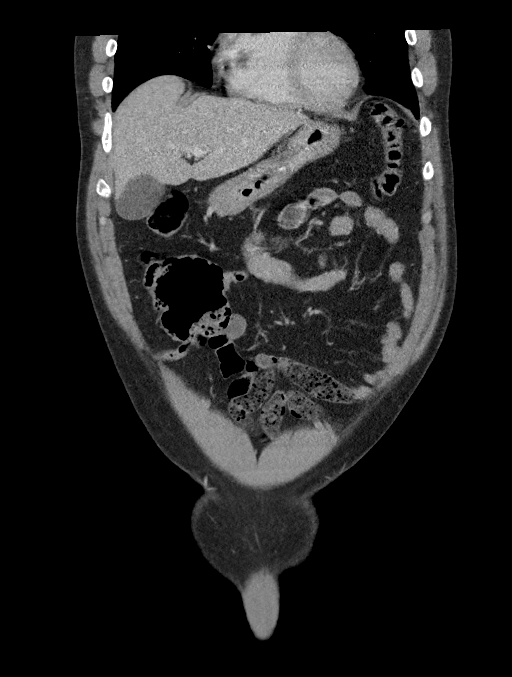
[im 57/128  soft-tissue]
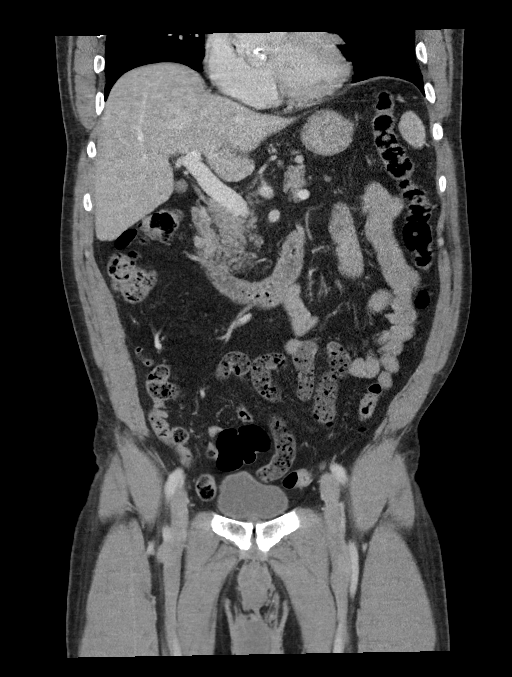
[im 71/128  soft-tissue]
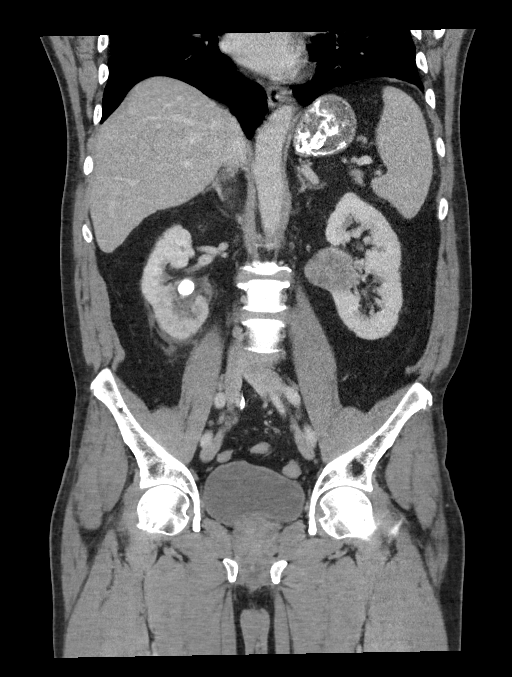

[15 of 46 positions shown; findings below may reference images not displayed]

FINDINGS: Lower chest: Mild subsegmental atelectasis in the lung bases.

Hepatobiliary: Liver is normal in size and contour with no
suspicious mass identified. Small hypodensity adjacent to the
falciform ligament likely represents focal fatty infiltration. No
gallstones, gallbladder wall thickening, or biliary dilatation.

Pancreas: Unremarkable. No pancreatic ductal dilatation or
surrounding inflammatory changes.

Spleen: Enlarged measuring 14 cm in length.

Adrenals/Urinary Tract: Adrenal glands appear normal. Urinary
bladder appears within normal limits.

Right kidney: Large calculus in the lower pole of the right kidney
measuring up to 1.5 x 1.5 x 2.6 cm in axial and craniocaudal
dimensions. The stone appears to obstruct the lower pole calices
which are mild-to-moderately distended. There is relatively
heterogeneous and diminished perfusion in the lower pole of the
right kidney with adjacent mild fat stranding/fluid. No dilatation
of the renal pelvis or ureter. 2.3 cm simple appearing exophytic
cyst in the upper pole.

Left kidney: No nephrolithiasis or hydronephrosis. There is a
smoothly marginated partially exophytic mass at the medial aspect of
the kidney which demonstrates inhomogeneous apparent mild diffuse
enhancement and measures up to 3.9 x 6.5 x 6.3 cm in axial and
craniocaudal dimensions, and is new since previous study.

Stomach/Bowel: No bowel obstruction, free air or pneumatosis. No
bowel wall edema. Appendix is normal.

Vascular/Lymphatic: No bulky lymphadenopathy identified. Mild
atherosclerotic disease.

Reproductive: Prostate gland is enlarged.

Other: No ascites. Small umbilical hernia containing fat. Small
right inguinal hernia containing fat.

Musculoskeletal: No suspicious bony lesions.
IMPRESSION: 1. Large calculus in the lower pole of the right kidney causing
obstruction of the lower pole calices. Mild perinephric fat
stranding/fluid at the lower pole right kidney may be reactive
and/or secondary to caliceal rupture. Also correlation for possible
superimposed infection recommended.
2. Solid-appearing exophytic mass at the medial aspect of the left
kidney which appears to demonstrate mild diffuse enhancement,
concerning for malignancy. Consider follow-up with ultrasound and
renal mass protocol MRI, and urology consultation is recommended.
3. Prostatomegaly.
4. Splenomegaly.

## 2023-07-23 IMAGING — US IR NEPHROSTOMY PLACEMENT RIGHT
1 series · 3 of 3 positions shown · non-contrast
Comparison: CT AP, 04/13/2021.

INDICATION: RIGHT obstructing nephrolith.

EXAM:
ULTRASOUND AND FLUOROSCOPIC GUIDED PLACEMENT OF RIGHT NEPHROSTOMY
TUBE

[Series 1: ir nephrostomy placement right · 3 of 3 slices shown]
[im 1/3]
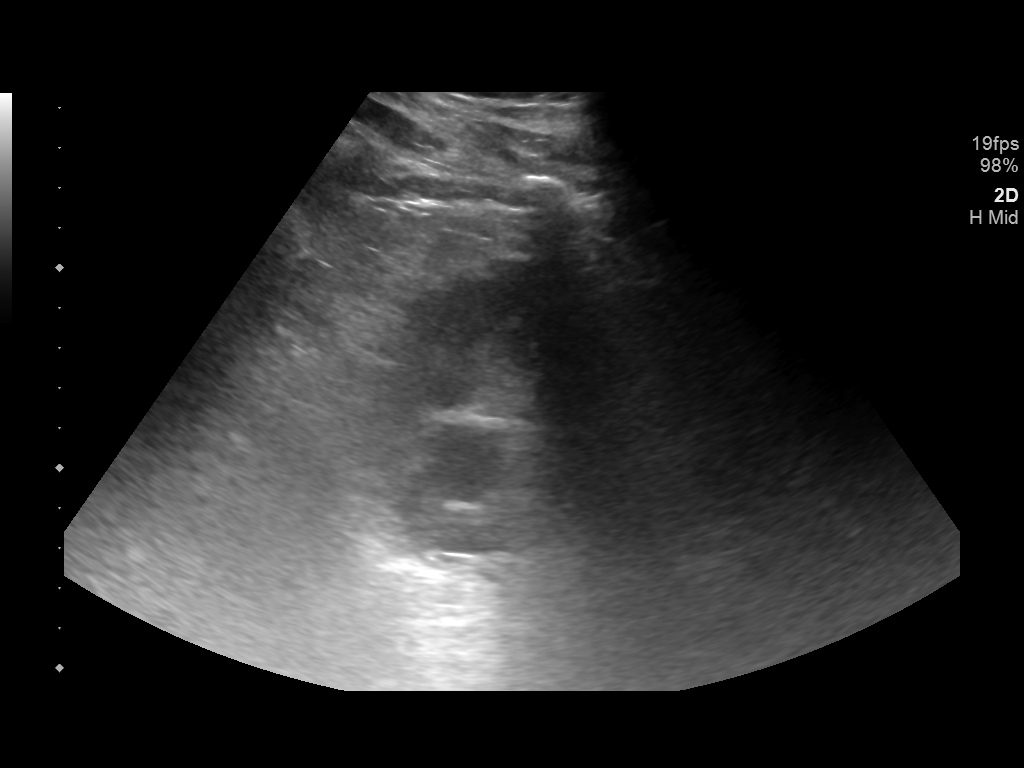
[im 2/3]
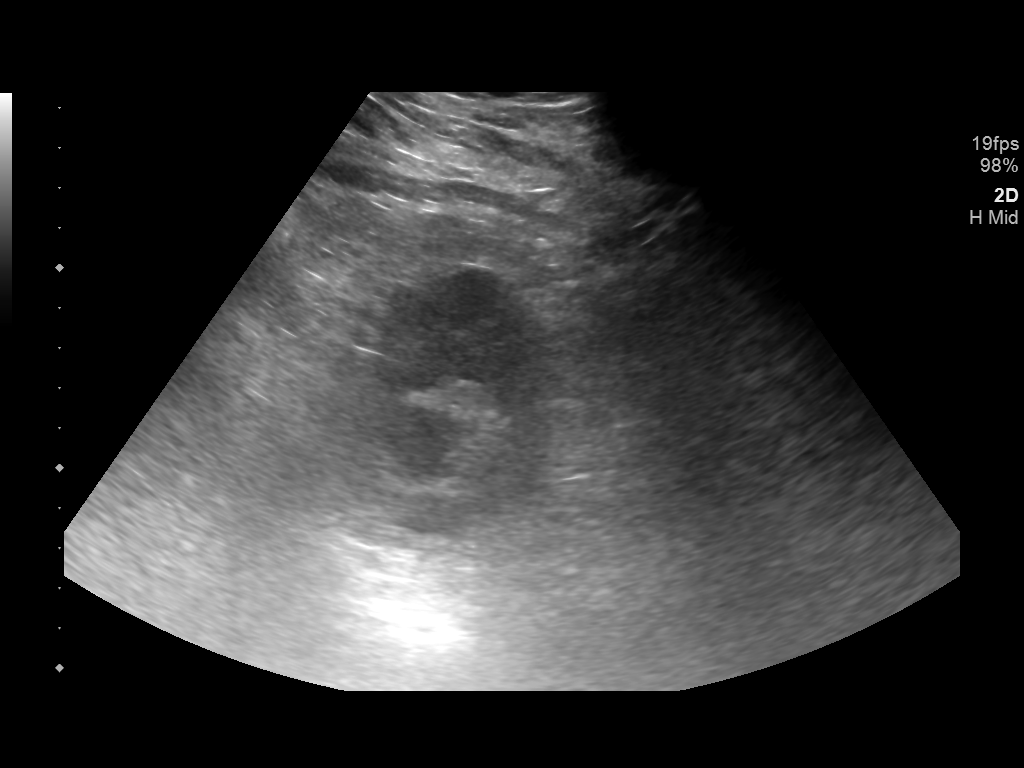
[im 3/3]
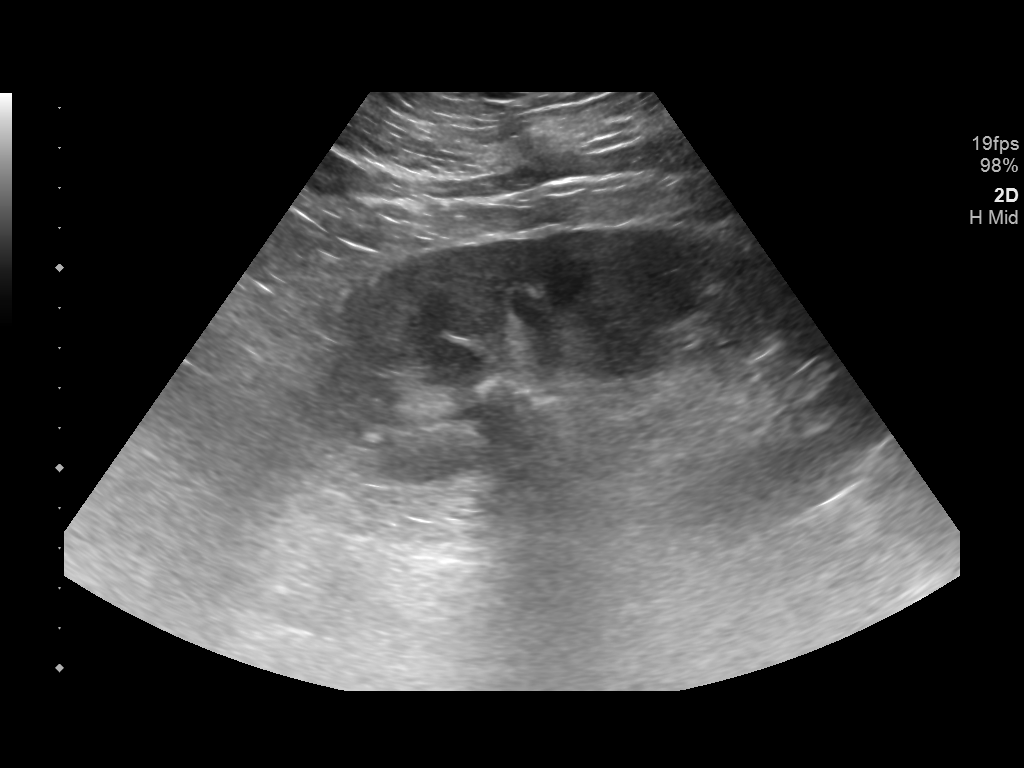

[3 of 3 positions shown; findings below may reference images not displayed]

MEDICATIONS:
The patient is on schedule IV antibiotics, with Rocephin.

ANESTHESIA/SEDATION:
Moderate (conscious) sedation was employed during this procedure. A
total of Versed 2.5 mg and Fentanyl 150 mcg was administered
intravenously.

Moderate Sedation Time: 29 minutes. The patient's level of
consciousness and vital signs were monitored continuously by
radiology nursing throughout the procedure under my direct
supervision.

CONTRAST:  15 mL Isovue 300 - administered into the renal collecting
system

FLUOROSCOPY TIME:  3.3 minutes 11 mGy.

COMPLICATIONS:
None immediate.

PROCEDURE:
The procedure, risks, benefits, and alternatives were explained to
the the patient and/or patient's representative, questions were
encouraged and answered and informed consent was obtained. A timeout
was performed prior to the initiation of the procedure.

The operative site was prepped and draped in the usual sterile
fashion and a sterile drape was applied covering the operative
field. A sterile gown and sterile gloves were used for the
procedure. Local anesthesia was provided with 1% Lidocaine with
epinephrine. Ultrasound was used to localize the RIGHT kidney. Under
direct ultrasound guidance, a 20 gauge needle was advanced into the
renal collecting system. An ultrasound image documentation was
performed. Access within the collecting system was confirmed with
the efflux of urine followed by limited contrast injection.

Under intermittent fluoroscopic guidance, an 0.018 wire was advanced
into the collecting system and the tract was dilated with an
Accustick stent. Next, over a short Amplatz wire, the track was
further dilated ultimately allowing placement of a 10-French
percutaneous nephrostomy catheter with end coiled and locked within
the renal pelvis.

Contrast was injected and several spot fluoroscopic images were
obtained in various obliquities. The catheter was secured at the
skin entrance site with an interrupted suture and a stat lock device
and connected to a gravity bag. Dressings were applied. The patient
tolerated procedure well without immediate postprocedural
complication.
FINDINGS: Ultrasound scanning demonstrates a moderate to severely dilated
RIGHT collecting system.

A posterior inferior calix was targeted and allow placement of a
10-French percutaneous nephrostomy catheter with end coiled and
locked within the RIGHT inferior pole renal collecting system.
Contrast injection confirmed appropriate positioning.

5 mL of purulent urine was obtained and submitted for analysis.
IMPRESSION: Successful placement of a RIGHT sided 10.2 Fr percutaneous
nephrostomy tube.

5 mL of purulent urine was obtained and submitted for analysis.

## 2023-07-23 IMAGING — DX DG CHEST 1V PORT
1 series · 1 of 1 positions shown · non-contrast
Comparison: Chest radiographs 03/23/2014 and earlier.

CLINICAL DATA: 55-year-old male with possible sepsis.

EXAM:
PORTABLE CHEST 1 VIEW

[chest ap grid]
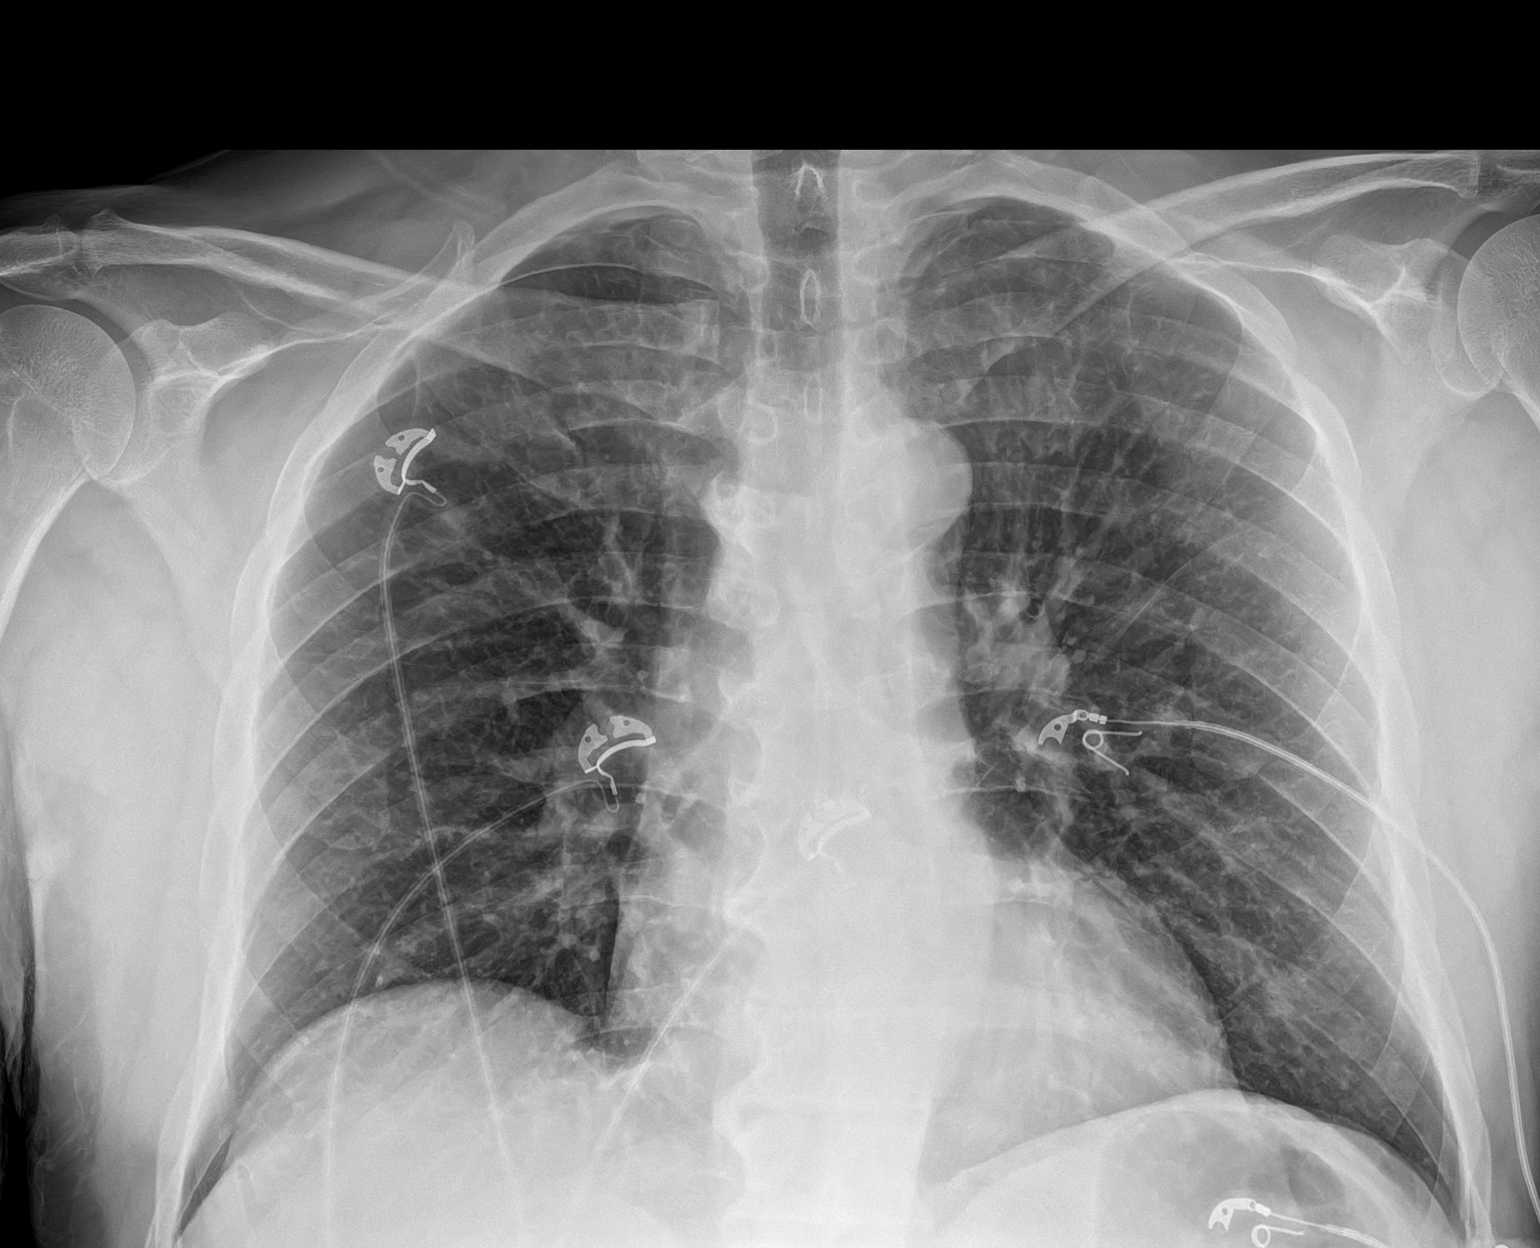

[1 of 1 positions shown; findings below may reference images not displayed]

FINDINGS: Portable AP upright view at 1421 hours. Normal lung volumes and
mediastinal contours. Visualized tracheal air column is within
normal limits. Allowing for portable technique the lungs are clear.
No pneumothorax or pleural effusion. No acute osseous abnormality
identified.
IMPRESSION: Negative portable chest.

## 2023-07-23 IMAGING — US US RENAL
1 series · 15 of 25 positions shown · non-contrast
Comparison: CT abdomen and pelvis dated April 13, 2021

CLINICAL DATA: Abnormal CT scan

EXAM:
RENAL / URINARY TRACT ULTRASOUND COMPLETE

[Series 1: us renal [facility] & [facility] · 15 of 68 slices shown]
[im 1/68]
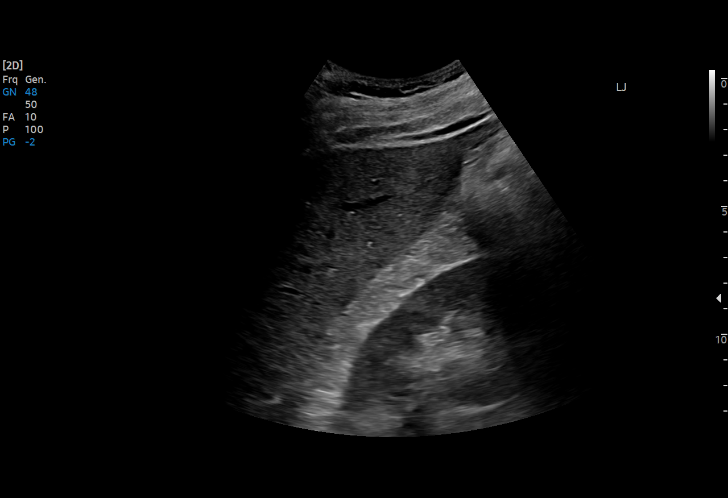
[im 6/68]
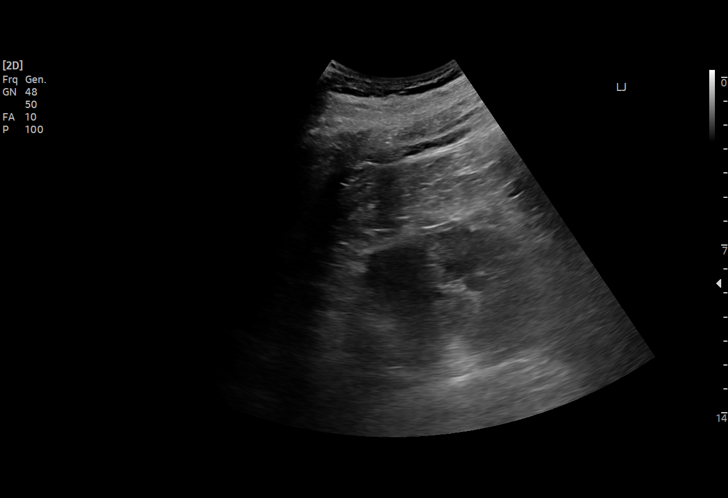
[im 12/68]
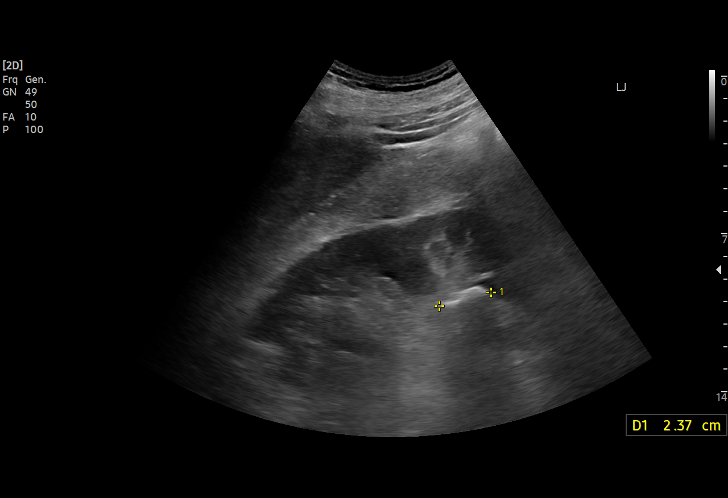
[im 14/68]
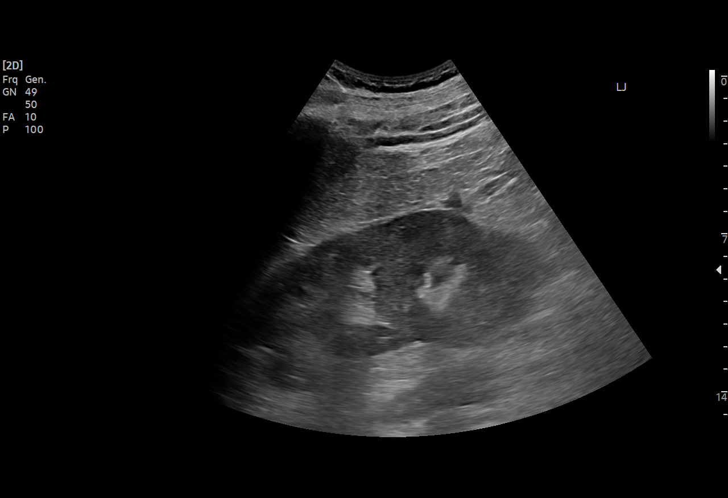
[im 20/68]
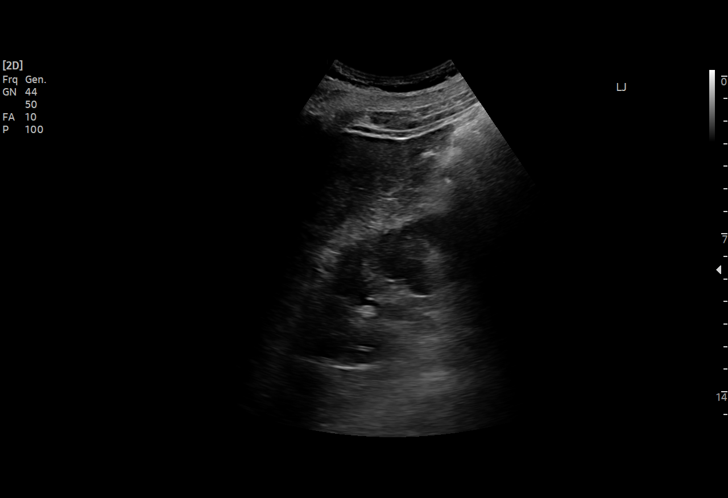
[im 26/68]
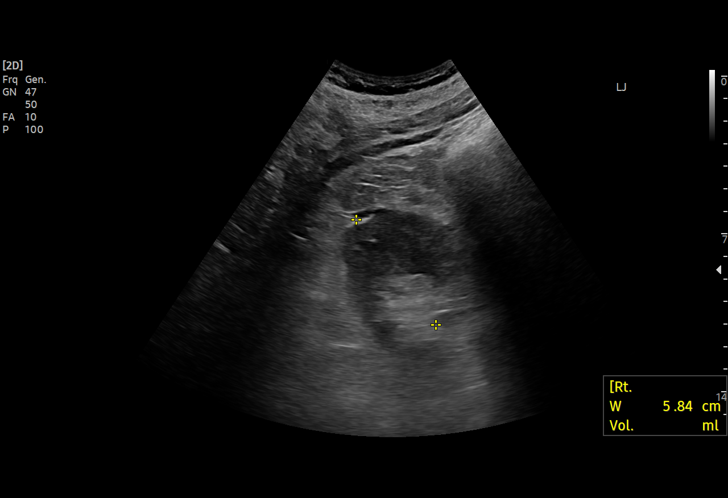
[im 28/68]
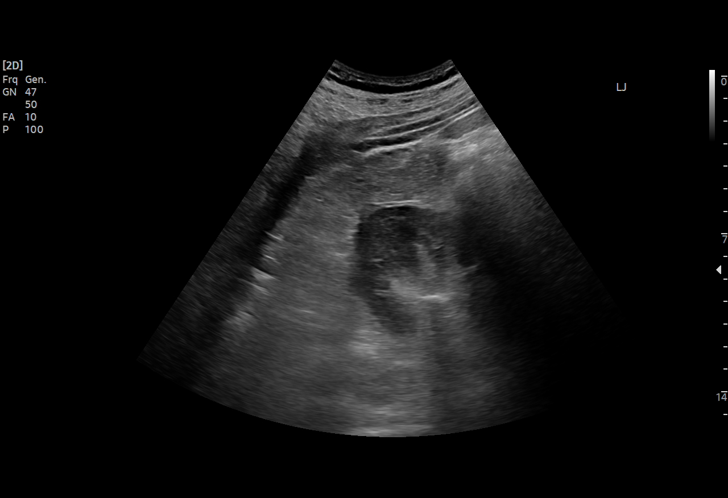
[im 34/68]
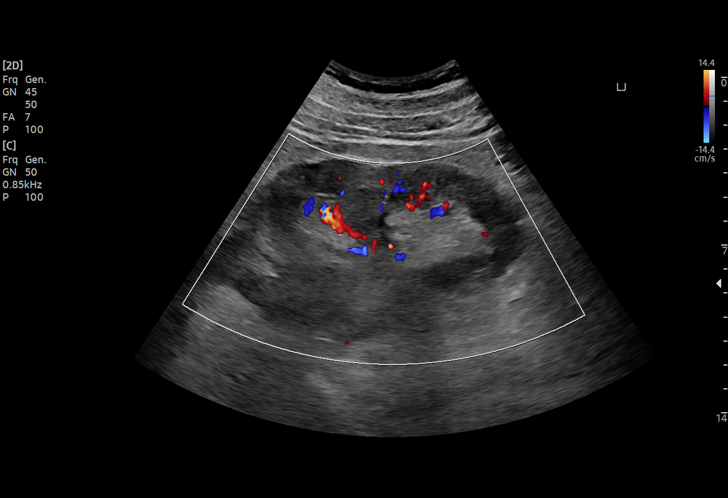
[im 40/68]
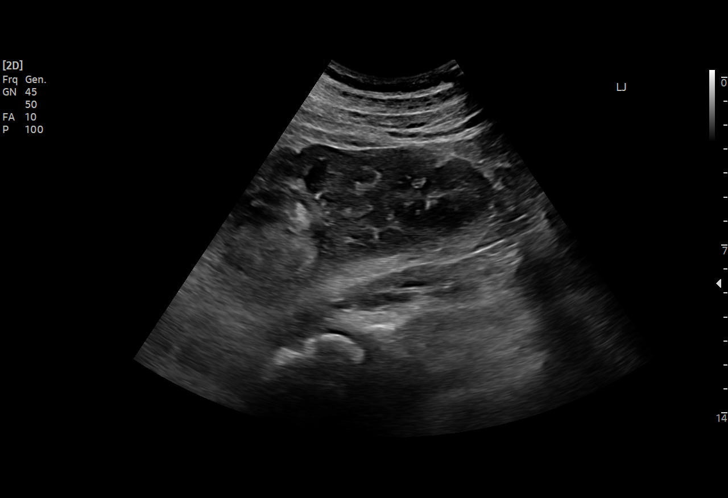
[im 42/68]
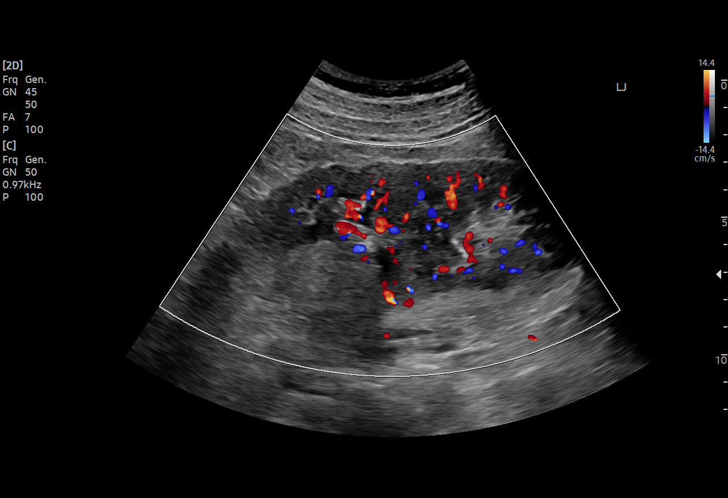
[im 48/68]
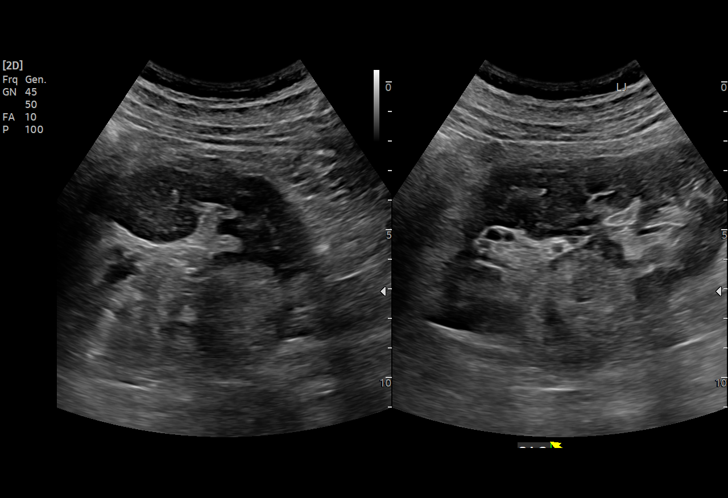
[im 54/68]
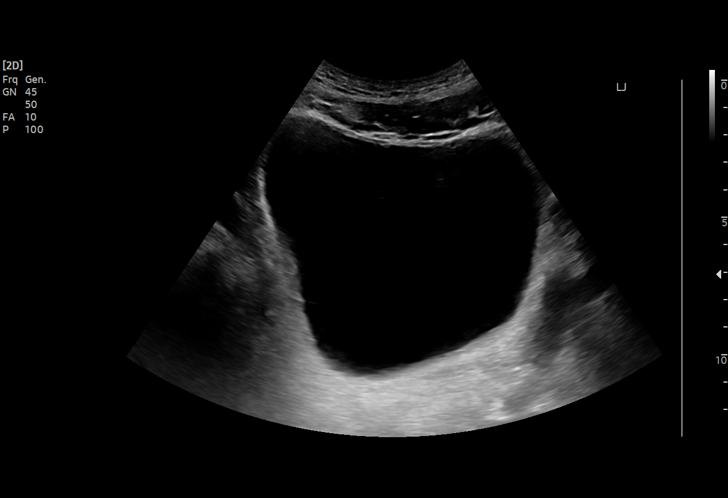
[im 56/68]
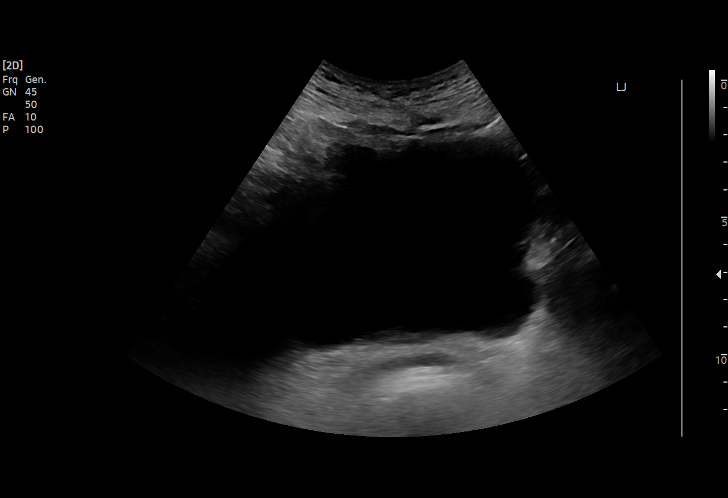
[im 62/68]
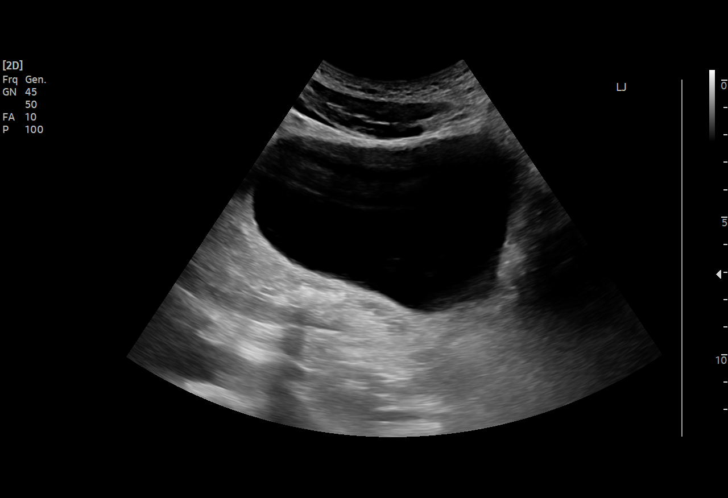
[im 68/68]
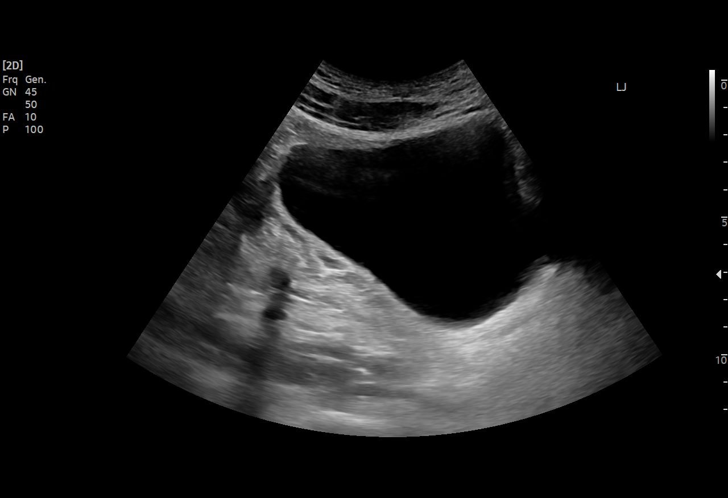

[15 of 25 positions shown; findings below may reference images not displayed]

FINDINGS: Right Kidney:

Renal measurements: 14.7 x 6.7 x 5.8 cm = volume: 299 mL. Simple
cyst of the upper pole of the right kidney measuring 2.2 x 2.0 x
cm. Large calculus of the lower pole the right kidney measuring up
to 2.4 cm. No hydronephrosis.

Left Kidney:

Renal measurements: 14.0 x 6.8 x 7.0 cm = volume: 346 mL. Solid mass
of the lower pole left kidney measuring 3.1 x 4.0 x 4.4 cm. No
hydronephrosis.

Bladder:

Appears normal for degree of bladder distention. Bilateral ureter
jets visualized.

Other:

Suboptimal images due to bowel gas.
IMPRESSION: 1. Solid mass of the mid region of the left kidney measuring up to
4.4 cm, concerning for neoplasm. Recommend further evaluation with
renal protocol MRI.
2. Large calculus of the lower pole the right kidney measuring up to
2.4 cm.
3. No hydronephrosis. Caliectasis of the lower pole the right kidney
which was seen on prior CT is not well visualized, likely due to
overlying bowel gas.

## 2024-01-14 ENCOUNTER — Encounter: Payer: Self-pay | Admitting: *Deleted

## 2024-03-07 ENCOUNTER — Encounter: Payer: Self-pay | Admitting: Radiology
